# Patient Record
Sex: Male | Born: 1945 | Race: White | Hispanic: No | Marital: Married | Smoking: Never smoker
Health system: Southern US, Community
[De-identification: ages and names within clinical notes are randomized; demographics above are authoritative.]

## PROBLEM LIST (undated history)

## (undated) DIAGNOSIS — E291 Testicular hypofunction: Secondary | ICD-10-CM

## (undated) DIAGNOSIS — M797 Fibromyalgia: Secondary | ICD-10-CM

## (undated) DIAGNOSIS — T7840XA Allergy, unspecified, initial encounter: Secondary | ICD-10-CM

## (undated) DIAGNOSIS — Z86010 Personal history of colonic polyps: Secondary | ICD-10-CM

## (undated) DIAGNOSIS — M7918 Myalgia, other site: Secondary | ICD-10-CM

## (undated) DIAGNOSIS — I00 Rheumatic fever without heart involvement: Secondary | ICD-10-CM

## (undated) DIAGNOSIS — R0600 Dyspnea, unspecified: Secondary | ICD-10-CM

## (undated) DIAGNOSIS — K219 Gastro-esophageal reflux disease without esophagitis: Secondary | ICD-10-CM

## (undated) DIAGNOSIS — Z87828 Personal history of other (healed) physical injury and trauma: Secondary | ICD-10-CM

## (undated) DIAGNOSIS — K648 Other hemorrhoids: Secondary | ICD-10-CM

## (undated) DIAGNOSIS — E785 Hyperlipidemia, unspecified: Secondary | ICD-10-CM

## (undated) DIAGNOSIS — D751 Secondary polycythemia: Secondary | ICD-10-CM

## (undated) DIAGNOSIS — N200 Calculus of kidney: Secondary | ICD-10-CM

## (undated) DIAGNOSIS — I10 Essential (primary) hypertension: Secondary | ICD-10-CM

## (undated) DIAGNOSIS — R768 Other specified abnormal immunological findings in serum: Secondary | ICD-10-CM

## (undated) DIAGNOSIS — E559 Vitamin D deficiency, unspecified: Secondary | ICD-10-CM

## (undated) DIAGNOSIS — N400 Enlarged prostate without lower urinary tract symptoms: Secondary | ICD-10-CM

## (undated) DIAGNOSIS — Z87438 Personal history of other diseases of male genital organs: Secondary | ICD-10-CM

## (undated) DIAGNOSIS — R0609 Other forms of dyspnea: Secondary | ICD-10-CM

## (undated) DIAGNOSIS — M779 Enthesopathy, unspecified: Secondary | ICD-10-CM

## (undated) DIAGNOSIS — N419 Inflammatory disease of prostate, unspecified: Secondary | ICD-10-CM

## (undated) HISTORY — DX: Hyperlipidemia, unspecified: E78.5

## (undated) HISTORY — DX: Personal history of other (healed) physical injury and trauma: Z87.828

## (undated) HISTORY — DX: Other hemorrhoids: K64.8

## (undated) HISTORY — DX: Testicular hypofunction: E29.1

## (undated) HISTORY — DX: Inflammatory disease of prostate, unspecified: N41.9

## (undated) HISTORY — DX: Allergy, unspecified, initial encounter: T78.40XA

## (undated) HISTORY — DX: Calculus of kidney: N20.0

## (undated) HISTORY — DX: Fibromyalgia: M79.7

## (undated) HISTORY — DX: Enthesopathy, unspecified: M77.9

## (undated) HISTORY — DX: Other forms of dyspnea: R06.09

## (undated) HISTORY — DX: Vitamin D deficiency, unspecified: E55.9

## (undated) HISTORY — DX: Secondary polycythemia: D75.1

## (undated) HISTORY — DX: Other specified abnormal immunological findings in serum: R76.8

## (undated) HISTORY — DX: Essential (primary) hypertension: I10

## (undated) HISTORY — DX: Myalgia, other site: M79.18

## (undated) HISTORY — DX: Personal history of other diseases of male genital organs: Z87.438

## (undated) HISTORY — DX: Personal history of colonic polyps: Z86.010

## (undated) HISTORY — DX: Rheumatic fever without heart involvement: I00

## (undated) HISTORY — DX: Dyspnea, unspecified: R06.00

## (undated) HISTORY — DX: Gastro-esophageal reflux disease without esophagitis: K21.9

## (undated) HISTORY — DX: Benign prostatic hyperplasia without lower urinary tract symptoms: N40.0

---
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
## 1978-09-25 DIAGNOSIS — Z87828 Personal history of other (healed) physical injury and trauma: Secondary | ICD-10-CM

## 1978-09-25 HISTORY — DX: Personal history of other (healed) physical injury and trauma: Z87.828

## 1998-09-25 DIAGNOSIS — M779 Enthesopathy, unspecified: Secondary | ICD-10-CM

## 1998-09-25 HISTORY — DX: Enthesopathy, unspecified: M77.9

## 1998-09-25 HISTORY — PX: FINGER SURGERY: SHX640

## 1998-12-03 ENCOUNTER — Ambulatory Visit (HOSPITAL_COMMUNITY): Admission: RE | Admit: 1998-12-03 | Discharge: 1998-12-03 | Payer: Self-pay | Admitting: Internal Medicine

## 1998-12-03 ENCOUNTER — Encounter: Payer: Self-pay | Admitting: Internal Medicine

## 2002-02-10 ENCOUNTER — Ambulatory Visit (HOSPITAL_COMMUNITY): Admission: RE | Admit: 2002-02-10 | Discharge: 2002-02-10 | Payer: Self-pay | Admitting: Internal Medicine

## 2002-02-10 ENCOUNTER — Encounter: Payer: Self-pay | Admitting: Internal Medicine

## 2002-06-18 ENCOUNTER — Emergency Department (HOSPITAL_COMMUNITY): Admission: EM | Admit: 2002-06-18 | Discharge: 2002-06-18 | Payer: Self-pay | Admitting: *Deleted

## 2004-07-11 ENCOUNTER — Encounter: Admission: RE | Admit: 2004-07-11 | Discharge: 2004-07-11 | Payer: Self-pay | Admitting: Internal Medicine

## 2005-04-28 ENCOUNTER — Encounter: Admission: RE | Admit: 2005-04-28 | Discharge: 2005-04-28 | Payer: Self-pay | Admitting: General Surgery

## 2005-08-25 ENCOUNTER — Ambulatory Visit (HOSPITAL_COMMUNITY): Admission: RE | Admit: 2005-08-25 | Discharge: 2005-08-25 | Payer: Self-pay | Admitting: General Surgery

## 2005-08-25 HISTORY — PX: OTHER SURGICAL HISTORY: SHX169

## 2005-10-10 ENCOUNTER — Encounter: Admission: RE | Admit: 2005-10-10 | Discharge: 2005-10-10 | Payer: Self-pay | Admitting: General Surgery

## 2007-06-05 ENCOUNTER — Ambulatory Visit: Payer: Self-pay | Admitting: Gastroenterology

## 2007-06-19 ENCOUNTER — Ambulatory Visit: Payer: Self-pay | Admitting: Gastroenterology

## 2007-06-19 ENCOUNTER — Encounter: Payer: Self-pay | Admitting: Gastroenterology

## 2007-06-19 DIAGNOSIS — Z860101 Personal history of adenomatous and serrated colon polyps: Secondary | ICD-10-CM | POA: Insufficient documentation

## 2007-06-19 DIAGNOSIS — Z86010 Personal history of colonic polyps: Secondary | ICD-10-CM

## 2007-06-19 HISTORY — DX: Personal history of colonic polyps: Z86.010

## 2007-06-19 HISTORY — DX: Personal history of adenomatous and serrated colon polyps: Z86.0101

## 2008-06-05 ENCOUNTER — Ambulatory Visit (HOSPITAL_BASED_OUTPATIENT_CLINIC_OR_DEPARTMENT_OTHER): Admission: RE | Admit: 2008-06-05 | Discharge: 2008-06-05 | Payer: Self-pay | Admitting: Orthopedic Surgery

## 2010-01-11 ENCOUNTER — Encounter: Payer: Self-pay | Admitting: Cardiology

## 2010-01-12 ENCOUNTER — Ambulatory Visit (HOSPITAL_COMMUNITY): Admission: RE | Admit: 2010-01-12 | Discharge: 2010-01-12 | Payer: Self-pay | Admitting: Internal Medicine

## 2010-01-18 ENCOUNTER — Encounter: Payer: Self-pay | Admitting: Cardiology

## 2010-01-19 ENCOUNTER — Encounter: Payer: Self-pay | Admitting: Cardiology

## 2010-01-19 LAB — CONVERTED CEMR LAB
ALT: 48 units/L
AST: 25 units/L
Albumin: 3.9 g/dL
Alkaline Phosphatase: 53 units/L
BUN: 28 mg/dL
Basophils Relative: 0 %
Bilirubin, Direct: 0.2 mg/dL
CO2: 26 meq/L
Calcium: 8 mg/dL
Chloride: 104 meq/L
Creatinine, Ser: 1.22 mg/dL
Eosinophils Relative: 0 %
Glucose, Bld: 94 mg/dL
HCT: 50.1 %
Hemoglobin: 18 g/dL
Lymphocytes, automated: 15 %
MCV: 92.1 fL
Monocytes Relative: 8 %
Platelets: 277 10*3/uL
Potassium: 4.7 meq/L
RBC: 5.44 M/uL
RDW: 14.5 %
Sodium: 140 meq/L
Total Bilirubin: 0.9 mg/dL
Total Protein: 6.1 g/dL
WBC: 11.4 10*3/uL

## 2010-01-20 ENCOUNTER — Encounter: Payer: Self-pay | Admitting: Cardiology

## 2010-01-20 ENCOUNTER — Encounter: Admission: RE | Admit: 2010-01-20 | Discharge: 2010-01-20 | Payer: Self-pay | Admitting: Internal Medicine

## 2010-01-21 ENCOUNTER — Ambulatory Visit: Payer: Self-pay | Admitting: Cardiology

## 2010-01-25 ENCOUNTER — Encounter: Payer: Self-pay | Admitting: Cardiology

## 2010-02-09 ENCOUNTER — Telehealth (INDEPENDENT_AMBULATORY_CARE_PROVIDER_SITE_OTHER): Payer: Self-pay | Admitting: *Deleted

## 2010-02-10 ENCOUNTER — Encounter (HOSPITAL_COMMUNITY): Admission: RE | Admit: 2010-02-10 | Discharge: 2010-04-26 | Payer: Self-pay | Admitting: Cardiology

## 2010-02-10 ENCOUNTER — Ambulatory Visit: Payer: Self-pay | Admitting: Cardiology

## 2010-02-10 ENCOUNTER — Ambulatory Visit: Payer: Self-pay | Admitting: Internal Medicine

## 2010-02-10 ENCOUNTER — Encounter: Payer: Self-pay | Admitting: Internal Medicine

## 2010-02-10 ENCOUNTER — Ambulatory Visit: Payer: Self-pay

## 2010-02-10 ENCOUNTER — Ambulatory Visit (HOSPITAL_COMMUNITY): Admission: RE | Admit: 2010-02-10 | Discharge: 2010-02-10 | Payer: Self-pay | Admitting: Cardiology

## 2010-02-14 ENCOUNTER — Encounter: Payer: Self-pay | Admitting: Internal Medicine

## 2010-02-15 ENCOUNTER — Ambulatory Visit: Payer: Self-pay | Admitting: Cardiology

## 2010-02-15 DIAGNOSIS — I10 Essential (primary) hypertension: Secondary | ICD-10-CM | POA: Insufficient documentation

## 2010-02-15 DIAGNOSIS — E785 Hyperlipidemia, unspecified: Secondary | ICD-10-CM

## 2010-02-15 LAB — CONVERTED CEMR LAB
Cholesterol: 205 mg/dL — ABNORMAL HIGH (ref 0–200)
Direct LDL: 151 mg/dL
HDL: 44.3 mg/dL (ref >39.00)
Pro B Natriuretic peptide (BNP): 5.3 pg/mL (ref 0.0–100.0)
Total CHOL/HDL Ratio: 5
Triglycerides: 102 mg/dL (ref 0.0–149.0)
VLDL: 20.4 mg/dL (ref 0.0–40.0)

## 2010-02-22 ENCOUNTER — Telehealth: Payer: Self-pay | Admitting: Cardiology

## 2010-02-22 ENCOUNTER — Ambulatory Visit: Payer: Self-pay | Admitting: Cardiology

## 2010-02-22 LAB — CONVERTED CEMR LAB
BUN: 23 mg/dL (ref 6–23)
Basophils Absolute: 0 10*3/uL (ref 0.0–0.1)
Basophils Relative: 0.4 % (ref 0.0–3.0)
CO2: 27 meq/L (ref 19–32)
Calcium: 9.1 mg/dL (ref 8.4–10.5)
Chloride: 104 meq/L (ref 96–112)
Creatinine, Ser: 1.1 mg/dL (ref 0.4–1.5)
Eosinophils Absolute: 0.1 10*3/uL (ref 0.0–0.7)
Eosinophils Relative: 1.9 % (ref 0.0–5.0)
GFR calc non Af Amer: 71.88 mL/min (ref >60)
Glucose, Bld: 153 mg/dL — ABNORMAL HIGH (ref 70–99)
HCT: 48.9 % (ref 39.0–52.0)
Hemoglobin: 17.2 g/dL — ABNORMAL HIGH (ref 13.0–17.0)
INR: 1 (ref 0.8–1.0)
Lymphocytes Relative: 26.8 % (ref 12.0–46.0)
Lymphs Abs: 1.4 10*3/uL (ref 0.7–4.0)
MCHC: 35.2 g/dL (ref 30.0–36.0)
MCV: 96 fL (ref 78.0–100.0)
Monocytes Absolute: 0.4 10*3/uL (ref 0.1–1.0)
Monocytes Relative: 8.4 % (ref 3.0–12.0)
Neutro Abs: 3.2 10*3/uL (ref 1.4–7.7)
Neutrophils Relative %: 62.5 % (ref 43.0–77.0)
Platelets: 169 10*3/uL (ref 150.0–400.0)
Potassium: 4.1 meq/L (ref 3.5–5.1)
Prothrombin Time: 10.4 s (ref 9.1–11.7)
RBC: 5.09 M/uL (ref 4.22–5.81)
RDW: 13.5 % (ref 11.5–14.6)
Sodium: 139 meq/L (ref 135–145)
WBC: 5.2 10*3/uL (ref 4.5–10.5)
aPTT: 29.7 s — ABNORMAL HIGH (ref 21.7–28.8)

## 2010-02-23 HISTORY — PX: CARDIAC CATHETERIZATION: SHX172

## 2010-02-25 ENCOUNTER — Ambulatory Visit: Payer: Self-pay | Admitting: Cardiology

## 2010-02-25 ENCOUNTER — Inpatient Hospital Stay (HOSPITAL_BASED_OUTPATIENT_CLINIC_OR_DEPARTMENT_OTHER): Admission: RE | Admit: 2010-02-25 | Discharge: 2010-02-25 | Payer: Self-pay | Admitting: Cardiology

## 2010-03-11 ENCOUNTER — Ambulatory Visit: Payer: Self-pay | Admitting: Cardiology

## 2010-03-11 LAB — CONVERTED CEMR LAB
BUN: 18 mg/dL (ref 6–23)
CO2: 31 meq/L (ref 19–32)
Calcium: 9.1 mg/dL (ref 8.4–10.5)
Chloride: 104 meq/L (ref 96–112)
Creatinine, Ser: 1.3 mg/dL (ref 0.4–1.5)
GFR calc non Af Amer: 61.44 mL/min (ref >60)
Glucose, Bld: 99 mg/dL (ref 70–99)
Potassium: 4.5 meq/L (ref 3.5–5.1)
Sodium: 141 meq/L (ref 135–145)
Total CK: 238 units/L — ABNORMAL HIGH (ref 7–232)

## 2010-03-25 ENCOUNTER — Ambulatory Visit: Payer: Self-pay | Admitting: Cardiology

## 2010-03-29 ENCOUNTER — Telehealth: Payer: Self-pay | Admitting: Cardiology

## 2010-03-29 LAB — CONVERTED CEMR LAB
BUN: 23 mg/dL (ref 6–23)
CO2: 30 meq/L (ref 19–32)
Calcium: 8.9 mg/dL (ref 8.4–10.5)
Chloride: 105 meq/L (ref 96–112)
Creatinine, Ser: 1.3 mg/dL (ref 0.4–1.5)
GFR calc non Af Amer: 59.79 mL/min (ref >60)
Glucose, Bld: 105 mg/dL — ABNORMAL HIGH (ref 70–99)
Potassium: 4.2 meq/L (ref 3.5–5.1)
Sodium: 142 meq/L (ref 135–145)

## 2010-04-18 ENCOUNTER — Ambulatory Visit: Payer: Self-pay | Admitting: Cardiology

## 2010-04-22 LAB — CONVERTED CEMR LAB
ALT: 36 units/L (ref 0–53)
AST: 27 units/L (ref 0–37)
Albumin: 4 g/dL (ref 3.5–5.2)
Alkaline Phosphatase: 44 units/L (ref 39–117)
Bilirubin, Direct: 0.2 mg/dL (ref 0.0–0.3)
Cholesterol: 151 mg/dL (ref 0–200)
HDL: 35.8 mg/dL — ABNORMAL LOW (ref >39.00)
LDL Cholesterol: 90 mg/dL (ref 0–99)
Total Bilirubin: 1 mg/dL (ref 0.3–1.2)
Total CHOL/HDL Ratio: 4
Total Protein: 6.4 g/dL (ref 6.0–8.3)
Triglycerides: 124 mg/dL (ref 0.0–149.0)
VLDL: 24.8 mg/dL (ref 0.0–40.0)

## 2010-10-27 NOTE — Assessment & Plan Note (Signed)
Summary: NP6/AMD   Primary Provider:  [name]  CC:  initial Consult; Fatigue; SOB.  History of Present Illness: 65 yo with history of HTN, hyperlipidemia, fibromyalgia, and rheumatic fever as a child presents for evaluation of fatigue, chest pain, shortness of breath, and pulmonary edema seen on chest CT.  Patient states that he first became sick about 3 wks ago.  He had a fever to 101 along with shortness of breath, nausea, and vomiting.  He took an antibiotic without much relief.  He has been exhausted at work.  He is no longer running a fever.  He is now short of breath after walking 50 feet and with yardwork.  He has an occasional dry cough.  No orthopnea or PND.  He has occasional chest tightness that comes and goes and is not related to exertion.  This has been present for about 1 month.  He had a chest CT done on 4/28 showing coronary calcification and slight dependent interstitial pulmonary edema.  No definite pneumonia.   ECG: NSR, LAFB  Labs (4/11): HCT 50, K 4.7, creatinine 1.2, TSH normal  Current Medications (verified): 1)  Diovan Hct 160-12.5 Mg Tabs (Valsartan-Hydrochlorothiazide) .... Take 1/2 Tablet By Mouth Once Daily 2)  Prevacid 30 Mg Cpdr (Lansoprazole) .... Take 1 Tablet By Mouth Once Daily 3)  Celebrex 200 Mg Caps (Celecoxib) .... Take 1 Tablet By Mouth Two Times A Day 4)  Androgel Pump 1 % Gel (Testosterone) .... Take 1 Pump  By Mouth Once Daily 5)  Colcrys 0.6 Mg Tabs (Colchicine) .... Take 1 Tablet By Mouth Once Daily 6)  Multivitamins  Tabs (Multiple Vitamin) .... Take 1 By Mouth Once Daily 7)  Aspir-Low 81 Mg Tbec (Aspirin) .... Take 1 Tablet By Mouth Once Daily 8)  Wellbutrin Xl 300 Mg Xr24h-Tab (Bupropion Hcl) .... Take 1 Tablet By Mouth Once Daily 9)  Cialis 20 Mg Tabs (Tadalafil) .... Take 1 Tablet By Mouth Once Daily As Needed 10)  Nexium 40 Mg Cpdr (Esomeprazole Magnesium) .... Take 1 By Mouth Once Daily 11)  Fish Oil 1000 Mg Caps (Omega-3 Fatty Acids)  .... Take 1 By Mouth Two Times A Day  Allergies: 1)  ! Lipitor 2)  ! Lyrica 3)  ! Atenolol 4)  ! Paxil 5)  ! Darvon  Past History:  Past Medical History: 1. BPH 2. G E R D 3. Hypertension 4. Fibromyalgia 5. Hypogonadism 6. History of epididmyitis 7. Myofascial pain syndrome 8. Nephrolithiasis 9. Recurrent prostatitis 10. Prolapsing internal hemorrhoids 11. Hx of head injury/concussion (1980), skull fracture.  12. Hx of left biceps tendinitis (2000) 13. Rheumatic fever x 3 at ages 25, 10, and 29 26. Hyperlipidemia: myalgias with Lipitor  Family History: Father:Deceased  at age 23, died during surgery for aortic stenosis Mother:Deceased at age 101 to HTN, pituitary tumor, Hx of TBC, and CA of breast.  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 Married, lives in [place] Tobacco Use - rare cigars.  Alcohol Use - yes, occasionally drink social cocktail, beer, or  mixed drink daily  Review of Systems       All systems reviewed and negative except as per HPI.   Vital Signs:  Patient profile:   65 year old male Height:      71 inches Weight:      206 pounds BMI:     28.84 Pulse rate:   77 / minute BP sitting:   122 / 84  (right arm)  Vitals Entered By: Stanton Kidney, EMT-P (January 21, 2010 2:56 PM)  Physical Exam  General:  Well developed, well nourished, in no acute distress. Head:  normocephalic and atraumatic Nose:  no deformity, discharge, inflammation, or lesions Mouth:  Teeth, gums and palate normal.  Oral mucosa normal. Neck:  Neck supple, no JVD. No masses, thyromegaly or abnormal cervical nodes. Lungs:  Clear bilaterally to auscultation and percussion. Heart:  Non-displaced PMI, chest non-tender; regular rate and rhythm, S1, S2 without murmurs, rubs or gallops. Carotid upstroke normal, no bruit.  Pedals normal pulses. No edema, no varicosities. Abdomen:  Bowel sounds positive; abdomen soft and non-tender without masses, organomegaly, or hernias noted. No hepatosplenomegaly. Msk:  Back normal, normal gait. Muscle strength and tone normal. Extremities:  No clubbing or cyanosis. Neurologic:  Alert and oriented x 3. Skin:  Intact without lesions or rashes. Psych:  Normal affect.   Impression & Recommendations:  Problem # 1:  SHORTNESS OF BREATH (ICD-786.05) Patient has had dyspnea with exertion for the last 3 weeks.  This is new.  It began after an episode of fever to 101 (he took an antibiotic at the time).  CT chest showed possible dependent interstitial edema  but no definite PNA.  It is possible, given the onset with fever, that this was an episode of atypical PNA.  However, his breathing still has not improved despite no further fever and a course of antibiotics.  Given the increased interstitial markings on CT that could be pulmonary edema, I will get an echocardiogram to assess LV function.  I will also draw a BNP.    Problem # 2:  CHEST PAIN-UNSPECIFIED (ICD-786.50) Patient has atypical chest pain but does have exertional dyspnea and coronary calcification on CT.  I will have him do an ETT-myoview for risk stratification.  We also need to check fasting lipids.    Patient Instructions: 1)  Your physician recommends that you schedule a follow-up appointment in: 2-3 weeks 2)  Your physician recommends that you return for a FASTING lipid profile: day of stress test (lipids/liver) 3)  Your physician recommends that you continue on your current medications as directed. Please refer to the  Current Medication list given to you today. 4)  Your physician has requested that you have an echocardiogram.  Echocardiography is a painless test that uses sound waves to create images of your heart. It provides your doctor with information about the size and shape of your heart and how well your heart's chambers and valves are working.  This procedure takes approximately one hour. There are no restrictions for this procedure. 5)  Your physician has requested that you have an exercise stress myoview.  For further information please visit [email or web address]  Please follow instruction sheet, as given.

## 2010-10-27 NOTE — Assessment & Plan Note (Signed)
Summary: ROV/JML  Medications Added SIMVASTATIN 20 MG TABS (SIMVASTATIN) one in the evening      Allergies Added:   Primary Provider:  [name]  CC:  Curtis Price feeling okay but feeling tired easy and out of breath. .  History of Present Illness: 65 yo with history of HTN, hyperlipidemia, fibromyalgia, and rheumatic fever as a child returns for evaluation of fatigue, chest pain, shortness of breath, and pulmonary edema seen on chest CT.  Patient states that he first became sick about a couple of months ago.  He had a fever to 101 along with shortness of breath, nausea, and vomiting.  He has neve felt completely better.  He has been exhausted at work.   He is now short of breath after walking 50 feet and with yardwork.  A flight of steps is very difficult for him now.  He is not improving.  He says that even before the febrile illness that he had been getting short of breath with exertion, just not as severe.  He has noted this since around Christmas.  Co-workers have been asking him what is wrong.  He has an occasional dry cough.  No orthopnea or PND.  He has occasional chest tightness that comes and goes and is not related to exertion.  He had a chest CT done on 4/28 showing coronary calcification and slight dependent interstitial pulmonary edema.  No definite pneumonia.   I had him do an ETT-myoview.  he was able to walk 8 minutes and stopped due to fatigue/SOB.  No ECG changes.  No evidence for ischemia or infarction.  Normal EF.  Echo showed normal LV systolic function but the RV appeared mildly dilated and dysfunctional.  Pulmonary hypertension was not identified but the TR jet on echo was not complete.    Labs (4/11): HCT 50, K 4.7, creatinine 1.2, TSH normal Labs (5/11): BNP 5.3, LDL 151, HDL 44  Current Medications (verified): 1)  Diovan Hct 160-12.5 Mg Tabs (Valsartan-Hydrochlorothiazide) .... Take 1/2 Tablet By Mouth Once Daily 2)  Prevacid 30 Mg Cpdr (Lansoprazole) .... Take 1 Tablet  By Mouth Once Daily 3)  Celebrex 200 Mg Caps (Celecoxib) .... Take 1 Tablet By Mouth Two Times A Day 4)  Androgel Pump 1 % Gel (Testosterone) .... Take 1 Pump  By Mouth Once Daily 5)  Colcrys 0.6 Mg Tabs (Colchicine) .... Take 1 Tablet By Mouth Once Daily 6)  Multivitamins  Tabs (Multiple Vitamin) .... Take 1 By Mouth Once Daily 7)  Aspir-Low 81 Mg Tbec (Aspirin) .... Take 1 Tablet By Mouth Once Daily 8)  Wellbutrin Xl 300 Mg Xr24h-Tab (Bupropion Hcl) .... Take 1 Tablet By Mouth Once Daily 9)  Cialis 20 Mg Tabs (Tadalafil) .... Take 1 Tablet By Mouth Once Daily As Needed 10)  Nexium 40 Mg Cpdr (Esomeprazole Magnesium) .... Take 1 By Mouth Once Daily 11)  Fish Oil 1000 Mg Caps (Omega-3 Fatty Acids) .... Take 1 By Mouth Two Times A Day  Allergies (verified): 1)  ! Lipitor 2)  ! Lyrica 3)  ! Atenolol 4)  ! Paxil 5)  ! Darvon  Past History:  Past Medical History: 1. BPH 2. G E R D 3. Hypertension 4. Fibromyalgia 5. Hypogonadism 6. History of epididmyitis 7. Myofascial pain syndrome 8. Nephrolithiasis 9. Recurrent prostatitis 10. Prolapsing internal hemorrhoids 11. Hx of head injury/concussion (1980), skull fracture.  12. Hx of left biceps tendinitis (2000) 13. Rheumatic fever x 3 at ages 42, 86, and 55.  No  significant valvular abnormality on 5/11 echo.  14. Hyperlipidemia: myalgias with Lipitor 15. Exertional dyspnea:  ETT-myoview (5/11): 8 min, no ischemic ECG changes, stopped due to fatigue/shortness of breath.  EF 60%, no ischemia or infarction.  Echo (5/11): EF 60%, no significant valvular abnormalities, mild RV dilation and dysfunction, no complete TR doppler jet so PA pressure hard to gauge.   Family History: Reviewed history from 01/21/2010 and no changes required. Father:Deceased  at age 43, died during surgery for aortic stenosis Mother:Deceased at age 50 to HTN, pituitary tumor, Hx of TBC, and CA of breast.  Siblings: Has a sister addicted to crack but other health  problems Children: A daughter addicted to crack cocaine abuse Grandchildren: 2- ages 28 and 21 in good health Family History of Hypertension Family History of Aortic stenosis Family History of CA of breast and pituitary tumor Family History of TBC and is negative for CVA Family History of ASHD Family History of DM Family History of epilepsy  Social History: Reviewed history from 01/21/2010 and no changes required. 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 Married, lives in [place] Tobacco Use - rare cigars.  Alcohol Use - yes, occasionally drink social cocktail, beer, or mixed drink daily  Review of Systems       All systems reviewed and negative except as per HPI.   Vital Signs:  Patient profile:   65 year old male Height:      71 inches Weight:      207 pounds BMI:     28.97 Pulse rate:   84 / minute Pulse rhythm:   regular BP sitting:   154 / 96  (right arm) Cuff size:   regular  Vitals Entered By: [name] CMA (Feb 15, 2010 10:22 AM)  Physical Exam  General:  Well developed, well nourished, in no acute distress. Neck:  Neck supple, no JVD. No masses, thyromegaly or abnormal cervical nodes. Lungs:  Clear bilaterally to auscultation and percussion. Heart:  Non-displaced PMI, chest non-tender; regular rate and rhythm, S1, S2 without murmurs, rubs or gallops. Carotid upstroke normal, no bruit.  Pedals normal pulses. No edema, no varicosities. Abdomen:  Bowel sounds positive; abdomen soft and non-tender without masses, organomegaly, or hernias noted. No hepatosplenomegaly. Extremities:  No clubbing or cyanosis. Neurologic:  Alert and oriented x 3. Psych:  Normal affect.   Impression & Recommendations:  Problem # 1:  SHORTNESS OF BREATH (ICD-786.05) Patient has had significant exertional dyspnea for several months.  It worsened after an episode of fever to 101 (he took an antibiotic at the time) 6 weeks or so ago.  It  has not gotten any better and is a definite change from prior.  CT chest in April showed possible dependent interstitial edema as well as coronary calcification but no definite PNA.  Myoview was not suggestive of ischemia or infarction.  Echocardiogram showed normal LV systolic function with no significant valvular abnormalities.  The RV was mildly dilated with mild systolic dysfunction.  PA systolic pressure was hard to determine given incomplete TR jet.  I had a long discussion with the patient today.  He still feels significantly limited.  He has no clear association of chest pain to his shortness of breath.  Given the RV findings and ongoing dyspnea, I would like to do a right heart cath to measure LV and RV filling pressures and also assess for pulmonary hypertension.  Given risk factors and ongoing exertional  dyspnea, I will also do a left heart cath.  If these tests are unremarkable, I will refer him to pulmonology for further evaluation.    Problem # 2:  HYPERLIPIDEMIA-MIXED (ICD-272.4) LDL is elevated at 151.  I will have him start simvastatin 20 mg daily with lipids/LFTs in 2 months.   Problem # 3:  HYPERTENSION, UNSPECIFIED (ICD-401.9) BP is high today and rechecked at 148/96.  At home, SBP has been < 140.  I will have him check his BP daily for a week and we will call him for readings.  If running high at home, will increase valsartan/HCTZ.    Other Orders: Cardiac Catheterization (Cardiac Cath)  Patient Instructions: 1)  Your physician has recommended you make the following change in your medication: 2)  Start Simvastatin 20mg  in the evening. 3)  Your physician has requested that you regularly monitor and record your blood pressure readings at home.  Please use the same machine at the same time of day to check your readings. I will call you in 1 week to get the readings. Luana Shu  4)  Your physician recommends that you return for lab work on Tuesday May 31,2011--BMP/CBC/PT/TSH  786.09 272.0 401.9 5)  Your physician recommends that you return for a FASTING lipid profile/liver profile in 2 months 401.9  272.0  786.09 6)  Your physician recommends that you schedule a follow-up appointment in: 2-3 weeks with Dr Shirlee Latch. 7)  Your physician has requested that you have a cardiac catheterization.  Cardiac catheterization is used to diagnose and/or treat various heart conditions. Doctors may recommend this procedure for a number of different reasons. The most common reason is to evaluate chest pain. Chest pain can be a symptom of coronary artery disease (CAD), and cardiac catheterization can show whether plaque is narrowing or blocking your heart's arteries. This procedure is also used to evaluate the valves, as well as measure the blood flow and oxygen levels in different parts of your heart.  For further information please visit [email or web address]  Please follow instruction sheet, as given. Prescriptions: SIMVASTATIN 20 MG TABS (SIMVASTATIN) one in the evening  #30 x 3   Entered by:   Katina Dung, RN, BSN   Authorized by:   Marca Ancona, MD   Signed by:   Katina Dung, RN, BSN on 02/15/2010   Method used:   Electronically to        [id number] (retail)       [address]       [place], [place]  [address]       Ph: [phone] or [phone]       Fax: [phone]   RxID:   [id number]    Appended Document: ROV/JML pt not taking Prevacid   Clinical Lists Changes  Medications: Removed medication of PREVACID 30 MG CPDR (LANSOPRAZOLE) Take 1 tablet by mouth once daily

## 2010-10-27 NOTE — Letter (Signed)
Summary: Cardiac Catheterization Instructions- [facility]  [facility], Main Office  [address]   [place], [place] [address]   Phone: [phone]  Fax: [phone]     02/15/2010 MRN: [id number]  [facility] [address] [place], [place]  [address]  Dear Mr. Lahm,   You are scheduled for a Cardiac Catheterization on Friday June 3,2011 with Dr. Marca Ancona.  Please arrive to the 1st floor of the [facility] at [facility] at 6:30 am on the day of your procedure. Please do not arrive before 6:30 a.m. Call the Heart and Vascular Center at [phone] if you are unable to make your appointmnet. The Code to get into the parking garage under the building is 0100. Take the elevators to the 1st floor. You must have someone to drive you home. Someone must be with you for the first 24 hours after you arrive home. Please wear clothes that are easy to get on and off and wear slip-on shoes. Do not eat or drink after midnight except water with your medications that morning. Bring all your medications and current insurance cards with you.   _x__ Make sure you take your aspirin.  _x__ You may take ALL of your medications with water that morning.    The usual length of stay after your procedure is 2 to 3 hours. This can vary.  If you have any questions, please call the office at the number listed above.   Katina Dung, RN, BSN

## 2010-10-27 NOTE — Assessment & Plan Note (Signed)
Summary: 3wk f/u sl  Medications Added DIOVAN HCT 320-25 MG TABS (VALSARTAN-HYDROCHLOROTHIAZIDE) one tablet daily CRESTOR 5 MG TABS (ROSUVASTATIN CALCIUM) one every other day MAGNESIUM 250 MG TABS (MAGNESIUM) take one tablet two times a day STRESS B COMPLEX/ZINC  TABS (MULTIPLE VITAMINS-MINERALS) one tablet once daily CALCIUM-VITAMIN D 250-125 MG-UNIT TABS (CALCIUM CARBONATE-VITAMIN D) take one tablet three times a week CRESTOR 5 MG TABS (ROSUVASTATIN CALCIUM) one every other day      Allergies Added:   Primary Provider:  [name]  CC:  3 week follow up.  Pt reports pain in his legs and thighs.  History of Present Illness: 65 yo with history of HTN, hyperlipidemia, fibromyalgia, and rheumatic fever as a child returns for followup of fatigue and dyspnea.  After last appointment, I did a right and left heart cath that showed normal left heart filling pressure, no pulmonary hypertension, normal LV systolic function, and minimal coronary disease.  I cannot explain his symptoms from his heart unless he gets exercise-dependent elevation in LV filling pressure with resulting dyspnea (exercise-induced diastolic dysfunction).  Actually since I saw him last, his exertional shortness of breath has resolved for the most part.  He feels much better.  His main complaint is leg pain that has begun since starting simvastatin.  He was under a lot of stress at work for several months, this is actually better now.  His BP is still running high despite increasing his valsartan/HCT when he last saw me.   Labs (4/11): HCT 50, K 4.7, creatinine 1.2, TSH normal Labs (5/11): BNP 5.3, LDL 151, HDL 44 Labs (6/11): K 4.5, creatinine 1.3, CK 238  Current Medications (verified): 1)  Diovan Hct 160-12.5 Mg Tabs (Valsartan-Hydrochlorothiazide) .... One  Daily 2)  Celebrex 200 Mg Caps (Celecoxib) .... Take 1 Tablet By Mouth Two Times A Day 3)  Androgel Pump 1 % Gel (Testosterone) .... Take 1 Pump  By Mouth Once  Daily 4)  Colcrys 0.6 Mg Tabs (Colchicine) .... Take 1 Tablet By Mouth Once Daily 5)  Multivitamins  Tabs (Multiple Vitamin) .... Take 1 By Mouth Once Daily 6)  Aspir-Low 81 Mg Tbec (Aspirin) .... Take 1 Tablet By Mouth Once Daily 7)  Wellbutrin Xl 300 Mg Xr24h-Tab (Bupropion Hcl) .... Take 1 Tablet By Mouth Once Daily 8)  Cialis 20 Mg Tabs (Tadalafil) .... Take 1 Tablet By Mouth Once Daily As Needed 9)  Nexium 40 Mg Cpdr (Esomeprazole Magnesium) .... Take 1 By Mouth Once Daily 10)  Fish Oil 1000 Mg Caps (Omega-3 Fatty Acids) .... Take 1 By Mouth Two Times A Day 11)  Simvastatin 20 Mg Tabs (Simvastatin) .... One in The Evening 12)  Magnesium 250 Mg Tabs (Magnesium) .... Take One Tablet Two Times A Day 13)  Stress B Complex/zinc  Tabs (Multiple Vitamins-Minerals) .... One Tablet Once Daily 14)  Calcium-Vitamin D 250-125 Mg-Unit Tabs (Calcium Carbonate-Vitamin D) .... Take One Tablet Three Times A Week  Allergies (verified): 1)  ! Lipitor 2)  ! Lyrica 3)  ! Atenolol 4)  ! Paxil 5)  ! Darvon  Past History:  Past Medical History: 1. BPH 2. G E R D 3. Hypertension 4. Fibromyalgia 5. Hypogonadism 6. History of epididmyitis 7. Myofascial pain syndrome 8. Nephrolithiasis 9. Recurrent prostatitis 10. Prolapsing internal hemorrhoids 11. Hx of head injury/concussion (1980), skull fracture.  12. Hx of left biceps tendinitis (2000) 13. Rheumatic fever x 3 at ages 81, 38, and 81.  No significant valvular abnormality on 5/11 echo.  14. Hyperlipidemia: myalgias with Lipitor 15. Exertional dyspnea:  ETT-myoview (5/11): 8 min, no ischemic ECG changes, stopped due to fatigue/shortness of breath.  EF 60%, no ischemia or infarction.  Echo (5/11): EF 60%, no significant valvular abnormalities, mild RV dilation and dysfunction, no complete TR doppler jet so PA pressure hard to gauge.  Left heart cath (6/11) showed minimal nonobstructive CAD with EF 60%.  Right heart cath (6/11): mean RA 10 mmHg, PA  25/18, mean PCWP 13 mmHg, CI 2.1  Family History: Reviewed history from 01/21/2010 and no changes required. Father:Deceased  at age 42, died during surgery for aortic stenosis Mother:Deceased at age 65 to HTN, pituitary tumor, Hx of TBC, and CA of breast.  Siblings: Has a sister addicted to crack but other health problems Children: A daughter addicted to crack cocaine abuse Grandchildren: 2- ages 28 and 46 in good health Family History of Hypertension Family History of Aortic stenosis Family History of CA of breast and pituitary tumor Family History of TBC and is negative for CVA Family History of ASHD Family History of DM Family History of epilepsy  Social History: Reviewed history from 01/21/2010 and no changes required. 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 Married, lives in [place] Tobacco Use - rare cigars.  Alcohol Use - yes, occasionally drink social cocktail, beer, or mixed drink daily  Review of Systems       All systems reviewed and negative except as per HPI.   Vital Signs:  Patient profile:   65 year old male Height:      71 inches Weight:      212 pounds BMI:     29.67 Pulse rate:   82 / minute Pulse rhythm:   regular BP sitting:   152 / 90  (left arm) Cuff size:   regular  Vitals Entered By: [name] CMA (March 11, 2010 9:37 AM)  Physical Exam  General:  Well developed, well nourished, in no acute distress. Neck:  Neck supple, no JVD. No masses, thyromegaly or abnormal cervical nodes. Lungs:  Clear bilaterally to auscultation and percussion. Heart:  Non-displaced PMI, chest non-tender; regular rate and rhythm, S1, S2 without murmurs, rubs or gallops. Carotid upstroke normal, no bruit.  Pedals normal pulses. No edema, no varicosities. Abdomen:  Bowel sounds positive; abdomen soft and non-tender without masses, organomegaly, or hernias noted. No hepatosplenomegaly. Extremities:  No clubbing or  cyanosis. Neurologic:  Alert and oriented x 3. Psych:  Normal affect.   Impression & Recommendations:  Problem # 1:  MUSCLE PAIN (ICD-729.1) Very slight CK elevation.  I suspect this may be due to Zocor.  I will have him stop Zocor and start on a very low dose of Crestor at 5 mg every other day.  Will get lipids/LFTs in 2 months.    Problem # 2:  SHORTNESS OF BREATH (ICD-786.05) I cannot find a cardiac explanation for his shortness of breath.  Coronaries have minimal disease, LV systolic function is normal, and at rest, LV filling pressure is normal.  It is possible that he could have some diastolic dysfunction with exertion manifesting as shortness of breath.  The best way to deal with this will be blood pressure control.    Problem # 3:  HYPERTENSION, UNSPECIFIED (ICD-401.9) BP is still running high.  Will increase valsartan/HCT to 320/25 mg daily.  BMET and BP check in 2 wks.   Other Orders: TLB-BMP (Basic Metabolic Panel-BMET) (80048-METABOL) TLB-CK  Total Only(Creatine Kinase/CPK) (82550-CK)  Patient Instructions: 1)  Your physician has recommended you make the following change in your medication:  2)  Stop Zocor(simvastatin) 3)  Start Crestor 5mg  every other day. 4)  Increase Valsartan/HCT to 320/25mg  daily. 5)  Lab  today--BMP/CPK--786.09 401.9   272.4 6)  Lab in 2 weeks--BMP 786.09 401.9 272.4 7)  Your physician recommends that you return for a FASTING lipid profile/liver profile --AUGUST 2011 786.09 401.9 272.4 8)  Your physician has requested that you regularly monitor and record your blood pressure readings at home.  Please use the same machine at the same time of day to check your readings. I will call you in 2 weeks to get the readings. Luana Shu [phone]  9)  Your physician wants you to follow-up in: ONE YEAR with Dr Shirlee Latch.  You will receive a reminder letter in the mail two months in advance. If you don't receive a letter, please call our office to schedule the  follow-up appointment. Prescriptions: DIOVAN HCT 320-25 MG TABS (VALSARTAN-HYDROCHLOROTHIAZIDE) one tablet daily  #30 x 11   Entered by:   Katina Dung, RN, BSN   Authorized by:   Marca Ancona, MD   Signed by:   Katina Dung, RN, BSN on 03/11/2010   Method used:   Electronically to        [facility]  [address] (retail)       [address]       [place], [place]  [address]       Ph: [phone] or [phone]       Fax: [phone]   RxID:   [id number] CRESTOR 5 MG TABS (ROSUVASTATIN CALCIUM) one every other day  #15 x 3   Entered by:   Katina Dung, RN, BSN   Authorized by:   Marca Ancona, MD   Signed by:   Katina Dung, RN, BSN on 03/11/2010   Method used:   Electronically to        [facility]  [address] (retail)       [address]       [place], [place]  [address]       Ph: [phone] or [phone]       Fax: [phone]   RxID:   [id number]

## 2010-10-27 NOTE — Letter (Signed)
Summary: Internal Other  Internal Other   Imported By: Harlon Flor 01/27/2010 16:55:28  _____________________________________________________________________  External Attachment:    Type:   Image     Comment:   External Document

## 2010-10-27 NOTE — Progress Notes (Signed)
Summary: Nuclear Pre-Procedure  Phone Note Outgoing Call   Call placed by: Milana Na, EMT-P,  Feb 09, 2010 3:46 PM Summary of Call: Left message with information on Myoview Information Sheet (see scanned document for details).      Nuclear Med Background Indications for Stress Test: Evaluation for Ischemia   History: CT/MRI, GXT  History Comments: '94 GXT NL 01/20/10 CHEST CT Coronary Calcification  Symptoms: Chest Tightness, DOE, Fatigue    Nuclear Pre-Procedure Height (in): 71

## 2010-10-27 NOTE — Letter (Signed)
Summary: [facility]  [facility]   Imported By: Debby Freiberg 03/22/2010 11:52:22  _____________________________________________________________________  External Attachment:    Type:   Image     Comment:   External Document

## 2010-10-27 NOTE — Progress Notes (Signed)
Summary: B/P readings   Phone Note Outgoing Call   Call placed by: Katina Dung, RN, BSN,  Feb 22, 2010 11:54 AM Call placed to: Patient Summary of Call: B/P readings  Follow-up for Phone Call        attempted to contact pt to get B/P readings LMTCB Katina Dung, RN, BSN  Feb 22, 2010 11:55 AM  discussed with wife --per wife pt had not  remembered to check  B/P--wife will try to remember to take and record B/P and bring the readings to appt 03-11-10 with Dr Daphane Shepherd will try to check B/P tonight and if it is  still in the 154/96 range she will call before the cath 02-25-10     Appended Document: B/P readings I asked him in the cath lab today to increase his valsartan/HCTZ to a full pil (160/12.5 today).  He will need a new prescription.   Appended Document: B/P readings LMTCB  Appended Document: B/P readings LMTCB  Appended Document: B/P readings I talked with wife--she states pt had not increased Valsartan/HCT to a full pill daily but she will ask pt to increase to one pill daily--pt has an appt with Dr Shirlee Latch 03-11-10   Clinical Lists Changes  Medications: Changed medication from DIOVAN HCT 160-12.5 MG TABS (VALSARTAN-HYDROCHLOROTHIAZIDE) Take 1/2 tablet by mouth once daily to DIOVAN HCT 160-12.5 MG TABS (VALSARTAN-HYDROCHLOROTHIAZIDE) one  daily

## 2010-10-27 NOTE — Assessment & Plan Note (Signed)
Summary: Cardiology Nuclear Testing  Nuclear Med Background Indications for Stress Test: Evaluation for Ischemia   History: CT/MRI, GXT  History Comments: '94 GXT NL 01/20/10 CHEST CT Coronary Calcification  Symptoms: Chest Tightness, DOE, Fatigue    Nuclear Pre-Procedure Caffeine/Decaff Intake: none NPO After: 7:30 PM Lungs: clear IV 0.9% NS with Angio Cath: 20g     IV Site: (R) AC IV Started by: Milana Na EMT-P Chest Size (in) 44     Height (in): 71 Weight (lb): 206 BMI: 28.84  Nuclear Med Study 1 or 2 day study:  1 day     Stress Test Type:  Stress Reading MD:  Arvilla Meres, MD     Referring MD:  D.McLean Resting Radionuclide:  Technetium 45m Tetrofosmin     Resting Radionuclide Dose:  11.0 mCi  Stress Radionuclide:  Technetium 39m Tetrofosmin     Stress Radionuclide Dose:  33.0 mCi   Stress Protocol Exercise Time (min):  8:00 min     Max HR:  148 bpm     Predicted Max HR:  158 bpm  Max Systolic BP: 193 mm Hg     Percent Max HR:  93.67 %     METS: 10.10 Rate Pressure Product:  16109    Stress Test Technologist:  Milana Na EMT-P     Nuclear Technologist:  Domenic Polite CNMT  Rest Procedure  Myocardial perfusion imaging was performed at rest 45 minutes following the intravenous administration of Myoview Technetium 105m Tetrofosmin.  Stress Procedure  The patient exercised for 8:00. The patient stopped due to fatigue and denied any chest pain.  There were no significant ST-T wave changes.  Myoview was injected at peak exercise and myocardial perfusion imaging was performed after a brief delay.  QPS Raw Data Images:  Normal; no motion artifact; normal heart/lung ratio. Stress Images:  There is normal uptake in all areas. Rest Images:  Normal homogeneous uptake in all areas of the myocardium. Subtraction (SDS):  Normal Transient Ischemic Dilatation:  .89  (Normal <1.22)  Lung/Heart Ratio:  .21  (Normal <0.45)  Quantitative Gated Spect Images QGS  EDV:  87 ml QGS ESV:  35 ml QGS EF:  60 % QGS cine images:  Normal  Findings Normal nuclear study      Overall Impression  Exercise Capacity: Fair exercise capacity. BP Response: Normal blood pressure response. Clinical Symptoms: No chest pain ECG Impression: No significant ST segment change suggestive of ischemia. Overall Impression: Normal stress nuclear study.  Appended Document: Cardiology Nuclear Testing normal study  Appended Document: Cardiology Nuclear Testing appt 02-15-10

## 2010-10-27 NOTE — Letter (Signed)
Summary: [facility]  [facility]   Imported By: Debby Freiberg 03/22/2010 11:51:19  _____________________________________________________________________  External Attachment:    Type:   Image     Comment:   External Document

## 2010-10-27 NOTE — Progress Notes (Signed)
Summary: PHI  PHI   Imported By: Harlon Flor 01/25/2010 09:31:08  _____________________________________________________________________  External Attachment:    Type:   Image     Comment:   External Document

## 2010-10-27 NOTE — Progress Notes (Signed)
Summary: B/P readings 03/29/10   Phone Note Outgoing Call   Call placed by: Katina Dung, RN, BSN,  March 29, 2010 5:54 PM Call placed to: Patient Summary of Call: B/P readings  Follow-up for Phone Call        LM with wife for pt to call me with B/P readings Katina Dung, RN, BSN  March 29, 2010 5:56 PM  [facility] Katina Dung, RN, BSN  March 30, 2010 8:48 AM  [facility] Katina Dung, RN, BSN  March 30, 2010 5:15 PM  talked with pt by telephone--he states B/P averaging 130s/80s--he states he feels good --pt states he is tolerating Crestor 5mg  every other day --will forward to Dr Shirlee Latch for review      Appended Document: B/P readings 03/29/10 ok, no changes  Appended Document: B/P readings 03/29/10 discussed with pt by telephone

## 2010-10-27 NOTE — Assessment & Plan Note (Signed)
Summary: Cardiology Nuclear Study  Nuclear Med Background Indications for Stress Test: Evaluation for Ischemia   History: CT/MRI, GXT  History Comments: '94 GXT NL 01/20/10 CHEST CT Coronary Calcification  Symptoms: Chest Tightness, DOE, Fatigue    Nuclear Pre-Procedure Caffeine/Decaff Intake: none NPO After: 7:30 PM IV 0.9% NS with Angio Cath: 20g     IV Site: (R) AC IV Started by: Milana Na EMT-P Chest Size (in) 44     Height (in): 71 Weight (lb): 206 BMI: 28.84

## 2010-12-12 LAB — POCT I-STAT 3, VENOUS BLOOD GAS (G3P V)
Acid-Base Excess: 1 mmol/L (ref 0.0–2.0)
Bicarbonate: 27.6 mEq/L — ABNORMAL HIGH (ref 20.0–24.0)
O2 Saturation: 70 %
TCO2: 29 mmol/L (ref 0–100)
pCO2, Ven: 51.9 mmHg — ABNORMAL HIGH (ref 45.0–50.0)
pH, Ven: 7.334 — ABNORMAL HIGH (ref 7.250–7.300)
pO2, Ven: 39 mmHg (ref 30.0–45.0)

## 2010-12-12 LAB — POCT I-STAT 3, ART BLOOD GAS (G3+)
Acid-Base Excess: 1 mmol/L (ref 0.0–2.0)
Bicarbonate: 26.3 mEq/L — ABNORMAL HIGH (ref 20.0–24.0)
O2 Saturation: 96 %
TCO2: 28 mmol/L (ref 0–100)
pCO2 arterial: 45.1 mmHg — ABNORMAL HIGH (ref 35.0–45.0)
pH, Arterial: 7.374 (ref 7.350–7.450)
pO2, Arterial: 81 mmHg (ref 80.0–100.0)

## 2010-12-12 LAB — POCT I-STAT GLUCOSE
Glucose, Bld: 117 mg/dL — ABNORMAL HIGH (ref 70–99)
Operator id: 141321

## 2011-02-07 NOTE — Op Note (Signed)
[name], [name] NO.:  [id number]   MEDICAL RECORD NO.:  [id number]          PATIENT TYPE:  AMB   LOCATION:  DSC                          FACILITY:  [facility]   PHYSICIAN:  Katy Fitch. Sypher, M.D. 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   OPERATION:  Resection of complex ganglion including dissection of  adventitia of radial artery and superficial branch of radial artery to  palm with debridement of scaphotrapeziotrapezoid joint capsule and  electrocautery of capsule.   OPERATING SURGEON:  Katy Fitch. Sypher, MD   ASSISTANT:  Curtis Rusk, PA-C   ANESTHESIA:  General by LMA.  Supervising anesthesiologist is Dr.  Jacklynn Bue.   INDICATIONS:  Curtis Price is a 65 year old gentleman referred  through the courtesy of Dr. Marlowe Shores for evaluation and management  of a complex and painful right volar ganglion cyst.  Clinical  examination revealed a clinically well 65 year old gentleman with a  complex volar ganglion.  X-ray of the wrist documented STT arthrosis,  which could be a driving factor behind the cyst formation.   The cyst was clearly involving the wall of the radial artery.   We advised the potential risks and benefits of surgery.  Curtis Price  elected to proceed to the OR at this time.   Preoperatively, he was transparently counseled that there is a high risk  of recurrence of this cyst.  These volar ganglion appeared to the ones  that involved the wall of the radial artery are challenging to  eradicate.  We will not sacrifice  the radial artery.   PROCEDURE:  Curtis Price was brought to the operating room and placed  in a supine position on the operating table.   Following the induction of general anesthesia by LMA technique, the left  arm was prepped with Betadine soap solution and sterilely draped.  A  pneumatic tourniquet was applied to the proximal right brachium.   Following exsanguination of the left arm with an Esmarch bandage,  arterial tourniquet was inflated to 220 mmHg.  Procedure commenced with  a Brunner zigzag incision to provide extensile exposure of the arterial  structures.  The radial artery proper was dissected in the distal  forearm and distally through the mass.  This myxomatous mass was  infiltrative involving the adventitia of the radial artery proper, the  superficial branch but did not involve the dorsal branch.  The venae  comitantes of the artery were intimately involved and were resected  along with the mass.  A large caliber vein crossing the mass was suture  ligated.   A small portion of the cyst wall was left on the superficial branch of  the radial artery in that with  further dissection we would likely have  violated the media and/or intima of the artery.   This was followed to the capsule of the STT joint.  It was removed with  a rongeur directly from the volar radiocarpal ligaments.  Bleeding  points were electrocauterized with bipolar current followed by release  of the tourniquet.  The radial artery and its branches were intact.  The  venae comitantes were electrocauterized.  The capsule was  electrocauterized the site of a joint fluid leak noted.   The wound was then repaired with subcutaneous suture of 4-0 Vicryl and  intradermal 3-0 Prolene with Steri-Strips.  A compressive dressing was  applied with a volar plaster splint.   There were no apparent complications.   Curtis Price tolerated the surgery and anesthesia well.  He was  transferred to the recovery room  in stable vital signs.  He will be  discharged with prescription for Vicodin 5 mg 1 p.o. q.4-6 h. p.r.n.  pain, #20 tablets without refill.  We will see him back for followup in  1 week or sooner if problems.       Katy Fitch Sypher, M.D.  Electronically Signed     RVS/MEDQ  D:  06/05/2008  T:  06/06/2008  Job:  [id number]   cc:   Lucky Cowboy, M.D.

## 2011-02-10 NOTE — Op Note (Signed)
NAMEABDULAHAD, Curtis Price NO.:  [id number]   MEDICAL RECORD NO.:  [id number]          PATIENT TYPE:  AMB   LOCATION:  DAY                          FACILITY:  [facility]   PHYSICIAN:  Ollen Gross. Vernell Morgans, M.D. DATE OF BIRTH:  1946-07-03   DATE OF PROCEDURE:  08/25/2005  DATE OF DISCHARGE:  08/25/2005                                 OPERATIVE REPORT   PREOPERATIVE DIAGNOSIS:  Bilateral inguinal hernias.   POSTOPERATIVE DIAGNOSIS:  Bilateral direct inguinal hernias.   PROCEDURE:  Bilateral inguinal hernia repairs with mesh.   SURGEON:  Dr. Carolynne Edouard.   ANESTHESIA:  General endotracheal.   DESCRIPTION OF PROCEDURE:  After informed consent was obtained, the patient  was brought to the operating room and placed in a supine position on the  operating room table. After adequate induction of general anesthesia, the  patient's abdomen and both groins were prepped with Betadine and draped in  the usual sterile manner. The right groin was addressed first. The right  inguinal region was infiltrated with a 0.25% Marcaine. A small incision was  made from the edge of the pubic tubercle on the right towards the anterior  cephalic spine for a distance of about 5 to 6 cm. This incision was carried  down through the skin and subcutaneous tissue sharply with the  electrocautery. A small bridging vein was encountered that was clamped with  hemostats, divided and ligated with 3-0 silk ties. The dissection was then  carried through the rest of the subcutaneous tissue sharply with the  electrocautery until the fascia of the external oblique was encountered. A  Weitlaner retractor was deployed, the fascia of the external oblique was  opened along its fibers towards the apex of the external ring with  Metzenbaum scissors. Blunt dissection was then carried out at the edge of  the pubic tubercle until the cord structures could be surrounded between two  fingers. A 1/2-inch Penrose drain was then placed  around the cord structures  for retraction purposes. The cord structures were gently skeletonized by a  combination of blunt hemostat dissection and sharp dissection with the  electrocautery. No hernia sac was identified in the cord. There was obvious  weakness to the medial part of the floor of the inguinal canal. This was  repaired with a interrupted #0 Vicryl stitch. A piece of a 3 x 6 Ultrapro  mesh was then chosen and cut to fit. The mesh was sewed inferiorly to the  shelving edge of inguinal ligament with a running 2-0 Prolene stitch. The  tails were cut in the mesh laterally and the tails were wrapped around the  cord structures. The mesh was sewed superiorly to the muscular aponeurotic  strength layer of the transversalis with interrupted 2-0 Prolene vertical  mattress stitches. The tails of the mesh were anchored laterally to the  shelving edge of the inguinal ligament with an interrupted 2-0 Prolene  stitch. Once this was accomplished, the mesh appeared to be in good position  without any tension. The wound was irrigated with copious amounts of saline.  The fascia of the  external oblique was then reapproximated with a running 2-  0 Vicryl stitch. The subcutaneous fascia was closed with a running 3-0  Vicryl stitch and the skin was closed with a running 4-0 Monocryl  subcuticular stitch. Next attention was turned to the left groin. Again the  left groin was infiltrated with 0.25% Marcaine. A small incision was made  from the edge of the pubic tubercle on the left towards the anterior  cephalic spine for a distance of about 5-6 cm. This incision was then  carried down through the skin and subcutaneous tissue sharply with the  electrocautery. A small bridging vein was encountered that was clamped with  hemostats, divided and ligated with 3-0 silk ties. The rest of the  dissection was carried sharply through the subcutaneous tissue with the  electrocautery down to the fascia of the  external oblique. The fascia of the  external oblique was opened along its fibers towards the apex of the  external ring with Metzenbaum scissors. Blunt dissection was then carried  out in this area until the cord structures could be surrounded between two  fingers. A 1/2-inch Penrose drain was then placed around the cord structures  for retraction purposes. The cord structures were then gently skeletonized  by a blunt hemostat dissection and sharp dissection with the electrocautery.  No hernia sac was identified in the cord structures. There was a definite  weakness to the floor medially, but the edges of this were less well defined  than on the other side, therefore a piece of 3 x 6 Ultrapro mesh was chosen  and cut to fit. The mesh was sewed inferiorly to the shelving edge of the  inguinal ligament with a running 2-0 Prolene stitch. Tails were cut in the  mesh laterally. The tails were wrapped around the cord structures. The mesh  was sewed superiorly to the muscular aponeurotic strength layer of the  transversalis with interrupted 2-0 Prolene vertical mattress stitches and  the tails of the mesh were anchored laterally to the shelving edge of the  inguinal ligament with an interrupted 2-0 Prolene stitch. The wound was then  irrigated with copious amounts of saline. The mesh appeared to be in good  position without any tension. On both sides, the ilioinguinal nerve was  identified and was involved with scar tissue. It was therefore clamped both  proximally and distally with hemostats, divided and ligated with 3-0 silk  ties. The fascia of the external oblique on the left was then reapproximated  with a running 2-0 Vicryl stitch. The subcutaneous fascia was closed with a  running 3-0 Vicryl stitch and the skin was closed with a running 4-0  Monocryl subcuticular stitch. Benzoin, Steri-Strips and sterile dressings  were applied to both wounds. The patient tolerated the procedure well.  At the end of the case, all needle, sponge and instrument counts were correct.  The patient's testicles were in the scrotum at the end of the case on both  sides and the patient was awakened and taken to recovery in stable  condition.      Ollen Gross. Vernell Morgans, M.D.  Electronically Signed     PST/MEDQ  D:  08/29/2005  T:  08/30/2005  Job:  [id number]

## 2011-04-03 ENCOUNTER — Encounter: Payer: Self-pay | Admitting: Cardiology

## 2011-06-28 LAB — BASIC METABOLIC PANEL
BUN: 19
CO2: 27
Calcium: 9.2
Chloride: 107
Creatinine, Ser: 1.09
GFR calc Af Amer: >60
GFR calc non Af Amer: >60
Glucose, Bld: 107 — ABNORMAL HIGH
Potassium: 4.1
Sodium: 141

## 2011-06-28 LAB — POCT HEMOGLOBIN-HEMACUE: Hemoglobin: 19.1 — ABNORMAL HIGH

## 2011-09-05 ENCOUNTER — Other Ambulatory Visit: Payer: Self-pay | Admitting: Internal Medicine

## 2011-09-05 DIAGNOSIS — M79669 Pain in unspecified lower leg: Secondary | ICD-10-CM

## 2011-09-06 ENCOUNTER — Ambulatory Visit
Admission: RE | Admit: 2011-09-06 | Discharge: 2011-09-06 | Disposition: A | Discharge status: Home / Self Care | Payer: 59 | Source: Ambulatory Visit | Attending: Internal Medicine | Admitting: Internal Medicine

## 2011-09-06 DIAGNOSIS — M79669 Pain in unspecified lower leg: Secondary | ICD-10-CM

## 2012-05-22 ENCOUNTER — Encounter: Payer: Self-pay | Admitting: Gastroenterology

## 2013-01-07 ENCOUNTER — Encounter: Payer: Self-pay | Admitting: Gastroenterology

## 2013-09-03 ENCOUNTER — Encounter: Payer: Self-pay | Admitting: Internal Medicine

## 2013-09-03 DIAGNOSIS — N400 Enlarged prostate without lower urinary tract symptoms: Secondary | ICD-10-CM | POA: Insufficient documentation

## 2013-09-03 DIAGNOSIS — E291 Testicular hypofunction: Secondary | ICD-10-CM | POA: Insufficient documentation

## 2013-09-03 DIAGNOSIS — M797 Fibromyalgia: Secondary | ICD-10-CM | POA: Insufficient documentation

## 2013-09-03 DIAGNOSIS — E782 Mixed hyperlipidemia: Secondary | ICD-10-CM | POA: Insufficient documentation

## 2013-09-03 DIAGNOSIS — K219 Gastro-esophageal reflux disease without esophagitis: Secondary | ICD-10-CM | POA: Insufficient documentation

## 2013-09-03 DIAGNOSIS — E559 Vitamin D deficiency, unspecified: Secondary | ICD-10-CM | POA: Insufficient documentation

## 2013-09-04 ENCOUNTER — Ambulatory Visit (INDEPENDENT_AMBULATORY_CARE_PROVIDER_SITE_OTHER): Payer: Medicare Other | Admitting: Emergency Medicine

## 2013-09-04 VITALS — BP 122/70 | HR 58 | Temp 98.0°F | Resp 18 | Ht 70.0 in | Wt 209.0 lb

## 2013-09-04 DIAGNOSIS — R300 Dysuria: Secondary | ICD-10-CM

## 2013-09-04 LAB — CBC WITH DIFFERENTIAL/PLATELET
Basophils Absolute: 0 10*3/uL (ref 0.0–0.1)
Basophils Relative: 1 % (ref 0–1)
Eosinophils Absolute: 0.2 10*3/uL (ref 0.0–0.7)
Eosinophils Relative: 4 % (ref 0–5)
HCT: 50.3 % (ref 39.0–52.0)
Hemoglobin: 18.3 g/dL — ABNORMAL HIGH (ref 13.0–17.0)
Lymphocytes Relative: 20 % (ref 12–46)
Lymphs Abs: 1.2 10*3/uL (ref 0.7–4.0)
MCH: 33.9 pg (ref 26.0–34.0)
MCHC: 36.4 g/dL — ABNORMAL HIGH (ref 30.0–36.0)
MCV: 93.1 fL (ref 78.0–100.0)
Monocytes Absolute: 0.8 10*3/uL (ref 0.1–1.0)
Monocytes Relative: 14 % — ABNORMAL HIGH (ref 3–12)
Neutro Abs: 3.7 10*3/uL (ref 1.7–7.7)
Neutrophils Relative %: 61 % (ref 43–77)
Platelets: 161 10*3/uL (ref 150–400)
RBC: 5.4 MIL/uL (ref 4.22–5.81)
RDW: 13.2 % (ref 11.5–15.5)
WBC: 5.8 10*3/uL (ref 4.0–10.5)

## 2013-09-04 MED ORDER — TAMSULOSIN HCL 0.4 MG PO CAPS: 0.4000 mg | ORAL_CAPSULE | Freq: Every day | ORAL | Status: DC

## 2013-09-04 MED ORDER — CIPROFLOXACIN HCL 500 MG PO TABS: 500.0000 mg | ORAL_TABLET | Freq: Two times a day (BID) | ORAL | Status: AC

## 2013-09-04 NOTE — Patient Instructions (Signed)
Urinary Tract Infection A urinary tract infection (UTI) can occur any place along the urinary tract. The tract includes the kidneys, ureters, bladder, and urethra. A type of germ called bacteria often causes a UTI. UTIs are often helped with antibiotic medicine.  HOME CARE   If given, take antibiotics as told by your doctor. Finish them even if you start to feel better.  Drink enough fluids to keep your pee (urine) clear or pale yellow.  Avoid tea, drinks with caffeine, and bubbly (carbonated) drinks.  Pee often. Avoid holding your pee in for a long time.  Pee before and after having sex (intercourse).  Wipe from front to back after you poop (bowel movement) if you are a woman. Use each tissue only once. GET HELP RIGHT AWAY IF:   You have back pain.  You have lower belly (abdominal) pain.  You have chills.  You feel sick to your stomach (nauseous).  You throw up (vomit).  Your burning or discomfort with peeing does not go away.  You have a fever.  Your symptoms are not better in 3 days. MAKE SURE YOU:   Understand these instructions.  Will watch your condition.  Will get help right away if you are not doing well or get worse. Document Released: 02/28/2008 Document Revised: 06/05/2012 Document Reviewed: 04/11/2012 [facility] Patient Information 2014 [place], [place]. Kidney Stones Kidney stones (urolithiasis) are solid masses that form inside your kidneys. The intense pain is caused by the stone moving through the kidney, ureter, bladder, and urethra (urinary tract). When the stone moves, the ureter starts to spasm around the stone. The stone is usually passed in your pee (urine).  HOME CARE  Drink enough fluids to keep your pee clear or pale yellow. This helps to get the stone out.  Strain all pee through the provided strainer. Do not pee without peeing through the strainer, not even once. If you pee the stone out, catch it in the strainer. The stone may be as small as a  grain of salt. Take this to your doctor. This will help your doctor figure out what you can do to try to prevent more kidney stones.  Only take medicine as told by your doctor.  Follow up with your doctor as told.  Get follow-up X-rays as told by your doctor. GET HELP IF: You have pain that gets worse even if you have been taking pain medicine. GET HELP RIGHT AWAY IF:   Your pain does not get better with medicine.  You have a fever or shaking chills.  Your pain increases and gets worse over 18 hours.  You have new belly (abdominal) pain.  You feel faint or pass out.  You are unable to pee. MAKE SURE YOU:   Understand these instructions.  Will watch your condition.  Will get help right away if you are not doing well or get worse. Document Released: 02/28/2008 Document Revised: 05/14/2013 Document Reviewed: 02/12/2013 [facility] Patient Information 2014 [place], [place].

## 2013-09-05 LAB — URINALYSIS, ROUTINE W REFLEX MICROSCOPIC
Bilirubin Urine: NEGATIVE
Glucose, UA: NEGATIVE mg/dL
Hgb urine dipstick: NEGATIVE
Ketones, ur: NEGATIVE mg/dL
Leukocytes, UA: NEGATIVE
Nitrite: NEGATIVE
Protein, ur: NEGATIVE mg/dL
Specific Gravity, Urine: 1.016 (ref 1.005–1.030)
Urobilinogen, UA: 1 mg/dL (ref 0.0–1.0)
pH: 6.5 (ref 5.0–8.0)

## 2013-09-06 LAB — URINE CULTURE
Colony Count: NO GROWTH
Organism ID, Bacteria: NO GROWTH

## 2013-09-07 ENCOUNTER — Encounter: Payer: Self-pay | Admitting: Emergency Medicine

## 2013-09-07 NOTE — Progress Notes (Signed)
Subjective:    Patient ID: Curtis Price, male    DOB: 07/15/1946, 67 y.o.   MRN: [id number]  HPI Comments: 67 yo male with history of kidney stones over 20 years ago presents with difficulty and mild discomfort with urine. He also notes low abdomen pain R > L on / off x months. He has 5/10 of pain at worse. He denies fever or hematuria.   Current Outpatient Prescriptions on File Prior to Visit  Medication Sig Dispense Refill  . aspirin (ASPIR-LOW) 81 MG EC tablet Take 81 mg by mouth daily.        Marland Kitchen buPROPion (WELLBUTRIN XL) 300 MG 24 hr tablet Take 300 mg by mouth daily.        . calcium-vitamin D (OSCAL) 250-125 MG-UNIT per tablet Take 1 tablet by mouth. Take 3 times per week.       . celecoxib (CELEBREX) 200 MG capsule Take 200 mg by mouth 2 (two) times daily.        . colchicine (COLCRYS) 0.6 MG tablet Take 0.6 mg by mouth daily.        Marland Kitchen esomeprazole (NEXIUM) 40 MG capsule Take 40 mg by mouth daily before breakfast.        . fish oil-omega-3 fatty acids 1000 MG capsule Take 1 capsule by mouth 2 (two) times daily.        . Magnesium 250 MG TABS Take 1 tablet by mouth 2 (two) times daily.        . Multiple Vitamin (MULTIVITAMIN) tablet Take 1 tablet by mouth daily.        . Multiple Vitamins-Minerals (STRESS B COMPLEX/ZINC) TABS Take 1 tablet by mouth daily.        . rosuvastatin (CRESTOR) 5 MG tablet Take 5 mg by mouth every other day.        . rosuvastatin (CRESTOR) 5 MG tablet Take 5 mg by mouth every other day.        . tadalafil (CIALIS) 20 MG tablet Take 20 mg by mouth daily as needed.        . Testosterone (ANDROGEL PUMP) 1.25 GM/ACT (1%) GEL Place 1 puff onto the skin daily.        . valsartan-hydrochlorothiazide (DIOVAN-HCT) 320-25 MG per tablet Take 1 tablet by mouth daily.         No current facility-administered medications on file prior to visit.   ALLERGIES Atenolol; Atorvastatin; Darvocet; Paroxetine; Pregabalin; and Propoxyphene hcl  Past Medical History   Diagnosis Date  . BPH (benign prostatic hypertrophy)   . GERD (gastroesophageal reflux disease)   . Fibromyalgia   . Hypogonadism male   . History of epididymitis   . Myofascial pain syndrome   . Nephrolithiasis   . Prostatitis     Recurrent  . Prolapsed internal hemorrhoids   . History of head injury without skull fracture 1980    Concussion  . Tendinitis 2000    Hx of left bicep  . Rheumatic fever     x3 at ages [age] and 89; No significant valvular abnormality on 5/11 echo  . Exertional dyspnea     ETT myoview 5/11: 8 min, no ischemic ECG changes, stopped due to fatigue/ SOB. EF 60%, no ischemia or infarction. Echo 5/11 EF 60%, no significant valvular abnormalities, mild RV dilation and dysfunction, no complete TR doppler jet so PA pressure hard to gauge. 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 R heart cath 6/11: mean RA  10 mmHg, PA 25/18, mean PCWP 13 mmHg, CI 2.1  . Hypertension   . Hyperlipidemia     Myalgias with Lipitor  . Vitamin D deficiency      Review of Systems  Gastrointestinal: Positive for abdominal pain.  Genitourinary: Positive for dysuria and difficulty urinating.  All other systems reviewed and are negative.    BP 122/70  Pulse 58  Temp(Src) 98 F (36.7 C) (Temporal)  Resp 18  Ht 5\' 10"  (1.778 m)  Wt 209 lb (94.802 kg)  BMI 29.99 kg/m2     Objective:   Physical Exam  Nursing note and vitals reviewed. Constitutional: He is oriented to person, place, and time. He appears well-developed and well-nourished.  HENT:  Head: Normocephalic and atraumatic.  Right Ear: External ear normal.  Left Ear: External ear normal.  Nose: Nose normal.  Eyes: Conjunctivae and EOM are normal.  Neck: Normal range of motion. Neck supple. No JVD present. No thyromegaly present.  Cardiovascular: Normal rate, regular rhythm, normal heart sounds and intact distal pulses.   Pulmonary/Chest: Effort normal and breath sounds normal.  Abdominal:  Soft. Bowel sounds are normal. He exhibits no distension and no mass. There is tenderness. There is no rebound and no guarding.  Mild suprapubic, NEG CVA   Musculoskeletal: Normal range of motion. He exhibits no edema and no tenderness.  Lymphadenopathy:    He has no cervical adenopathy.  Neurological: He is alert and oriented to person, place, and time. He has normal reflexes. No cranial nerve deficit. Coordination normal.  Skin: Skin is warm and dry.  Psychiatric: He has a normal mood and affect. His behavior is normal. Judgment and thought content normal.          Assessment & Plan:  Dysuria- ? UTI vs stone- Check labs, push fluids. Cipro 500 mg and Flomax AD. If increase SX ER. May need Ct scan

## 2013-10-16 ENCOUNTER — Other Ambulatory Visit: Payer: Self-pay | Admitting: Internal Medicine

## 2013-10-16 MED ORDER — BUPROPION HCL ER (XL) 300 MG PO TB24: 300.0000 mg | ORAL_TABLET | Freq: Every day | ORAL | Status: DC

## 2013-10-16 MED ORDER — TESTOSTERONE 12.5 MG/ACT (1%) TD GEL: TRANSDERMAL | Status: DC

## 2013-10-16 MED ORDER — PANTOPRAZOLE SODIUM 40 MG PO TBEC: 40.0000 mg | DELAYED_RELEASE_TABLET | Freq: Every day | ORAL | Status: DC

## 2013-10-17 ENCOUNTER — Other Ambulatory Visit: Payer: Self-pay | Admitting: Internal Medicine

## 2013-10-23 ENCOUNTER — Encounter: Payer: Self-pay | Admitting: Physician Assistant

## 2013-10-23 ENCOUNTER — Ambulatory Visit (INDEPENDENT_AMBULATORY_CARE_PROVIDER_SITE_OTHER): Payer: Medicare Other | Admitting: Physician Assistant

## 2013-10-23 VITALS — BP 138/80 | HR 72 | Temp 97.7°F | Resp 16 | Ht 70.0 in | Wt 214.0 lb

## 2013-10-23 DIAGNOSIS — Z79899 Other long term (current) drug therapy: Secondary | ICD-10-CM

## 2013-10-23 DIAGNOSIS — E559 Vitamin D deficiency, unspecified: Secondary | ICD-10-CM

## 2013-10-23 DIAGNOSIS — E785 Hyperlipidemia, unspecified: Secondary | ICD-10-CM

## 2013-10-23 DIAGNOSIS — I10 Essential (primary) hypertension: Secondary | ICD-10-CM

## 2013-10-23 DIAGNOSIS — E782 Mixed hyperlipidemia: Secondary | ICD-10-CM

## 2013-10-23 DIAGNOSIS — H6191 Disorder of right external ear, unspecified: Secondary | ICD-10-CM

## 2013-10-23 DIAGNOSIS — E291 Testicular hypofunction: Secondary | ICD-10-CM

## 2013-10-23 LAB — BASIC METABOLIC PANEL WITH GFR
BUN: 19 mg/dL (ref 6–23)
CO2: 25 mEq/L (ref 19–32)
Calcium: 9.2 mg/dL (ref 8.4–10.5)
Chloride: 106 mEq/L (ref 96–112)
Creat: 1.09 mg/dL (ref 0.50–1.35)
GFR, Est African American: 81 mL/min
GFR, Est Non African American: 70 mL/min
Glucose, Bld: 108 mg/dL — ABNORMAL HIGH (ref 70–99)
Potassium: 4.4 mEq/L (ref 3.5–5.3)
Sodium: 140 mEq/L (ref 135–145)

## 2013-10-23 LAB — CBC WITH DIFFERENTIAL/PLATELET
Basophils Absolute: 0 10*3/uL (ref 0.0–0.1)
Basophils Relative: 1 % (ref 0–1)
Eosinophils Absolute: 0.3 10*3/uL (ref 0.0–0.7)
Eosinophils Relative: 5 % (ref 0–5)
HCT: 49.4 % (ref 39.0–52.0)
Hemoglobin: 18.3 g/dL — ABNORMAL HIGH (ref 13.0–17.0)
Lymphocytes Relative: 23 % (ref 12–46)
Lymphs Abs: 1.1 10*3/uL (ref 0.7–4.0)
MCH: 33.3 pg (ref 26.0–34.0)
MCHC: 37 g/dL — ABNORMAL HIGH (ref 30.0–36.0)
MCV: 90 fL (ref 78.0–100.0)
Monocytes Absolute: 0.6 10*3/uL (ref 0.1–1.0)
Monocytes Relative: 13 % — ABNORMAL HIGH (ref 3–12)
Neutro Abs: 2.9 10*3/uL (ref 1.7–7.7)
Neutrophils Relative %: 58 % (ref 43–77)
Platelets: 165 10*3/uL (ref 150–400)
RBC: 5.49 MIL/uL (ref 4.22–5.81)
RDW: 13.6 % (ref 11.5–15.5)
WBC: 5 10*3/uL (ref 4.0–10.5)

## 2013-10-23 LAB — LIPID PANEL
Cholesterol: 216 mg/dL — ABNORMAL HIGH (ref 0–200)
HDL: 46 mg/dL (ref >39)
LDL Cholesterol: 137 mg/dL — ABNORMAL HIGH (ref 0–99)
Total CHOL/HDL Ratio: 4.7 Ratio
Triglycerides: 166 mg/dL — ABNORMAL HIGH (ref <150)
VLDL: 33 mg/dL (ref 0–40)

## 2013-10-23 LAB — HEPATIC FUNCTION PANEL
ALT: 33 U/L (ref 0–53)
AST: 23 U/L (ref 0–37)
Albumin: 4 g/dL (ref 3.5–5.2)
Alkaline Phosphatase: 48 U/L (ref 39–117)
Bilirubin, Direct: 0.2 mg/dL (ref 0.0–0.3)
Indirect Bilirubin: 0.6 mg/dL (ref 0.2–1.2)
Total Bilirubin: 0.8 mg/dL (ref 0.2–1.2)
Total Protein: 6.2 g/dL (ref 6.0–8.3)

## 2013-10-23 LAB — TSH: TSH: 3.314 u[IU]/mL (ref 0.350–4.500)

## 2013-10-23 LAB — MAGNESIUM: Magnesium: 2.1 mg/dL (ref 1.5–2.5)

## 2013-10-23 MED ORDER — TRAMADOL HCL 50 MG PO TABS: 50.0000 mg | ORAL_TABLET | Freq: Four times a day (QID) | ORAL | Status: DC | PRN

## 2013-10-23 MED ORDER — CELECOXIB 200 MG PO CAPS: 200.0000 mg | ORAL_CAPSULE | Freq: Two times a day (BID) | ORAL | Status: DC

## 2013-10-23 NOTE — Patient Instructions (Signed)
Fibromyalgia Fibromyalgia is a disorder that is often misunderstood. It is associated with muscular pains and tenderness that comes and goes. It is often associated with fatigue and sleep disturbances. Though it tends to be long-lasting, fibromyalgia is not life-threatening. CAUSES  The exact cause of fibromyalgia is unknown. People with certain gene types are predisposed to developing fibromyalgia and other conditions. Certain factors can play a role as triggers, such as:  Spine disorders.  Arthritis.  Severe injury (trauma) and other physical stressors.  Emotional stressors. SYMPTOMS   The main symptom is pain and stiffness in the muscles and joints, which can vary over time.  Sleep and fatigue problems. Other related symptoms may include:  Bowel and bladder problems.  Headaches.  Visual problems.  Problems with odors and noises.  Depression or mood changes.  Painful periods (dysmenorrhea).  Dryness of the skin or eyes. DIAGNOSIS  There are no specific tests for diagnosing fibromyalgia. Patients can be diagnosed accurately from the specific symptoms they have. The diagnosis is made by determining that nothing else is causing the problems. TREATMENT  There is no cure. Management includes medicines and an active, healthy lifestyle. The goal is to enhance physical fitness, decrease pain, and improve sleep. HOME CARE INSTRUCTIONS   Only take over-the-counter or prescription medicines as directed by your caregiver. Sleeping pills, tranquilizers, and pain medicines may make your problems worse.  Low-impact aerobic exercise is very important and advised for treatment. At first, it may seem to make pain worse. Gradually increasing your tolerance will overcome this feeling.  Learning relaxation techniques and how to control stress will help you. Biofeedback, visual imagery, hypnosis, muscle relaxation, yoga, and meditation are all options.  Anti-inflammatory medicines and  physical therapy may provide short-term help.  Acupuncture or massage treatments may help.  Take muscle relaxant medicines as suggested by your caregiver.  Avoid stressful situations.  Plan a healthy lifestyle. This includes your diet, sleep, rest, exercise, and friends.  Find and practice a hobby you enjoy.  Join a fibromyalgia support group for interaction, ideas, and sharing advice. This may be helpful. SEEK MEDICAL CARE IF:  You are not having good results or improvement from your treatment. FOR MORE INFORMATION  National Fibromyalgia Association: www.fmaware.[place]: www.arthritis.org Document Released: 09/11/2005 Document Revised: 12/04/2011 Document Reviewed: 12/22/2009 [facility] Patient Information 2014 [place], [place]. Cholesterol Cholesterol is a white, waxy, fat-like protein needed by your body in small amounts. The liver makes all the cholesterol you need. It is carried from the liver by the blood through the blood vessels. Deposits (plaque) may build up on blood vessel walls. This makes the arteries narrower and stiffer. Plaque increases the risk for heart attack and stroke. You cannot feel your cholesterol level even if it is very high. The only way to know is by a blood test to check your lipid (fats) levels. Once you know your cholesterol levels, you should keep a record of the test results. Work with your caregiver to to keep your levels in the desired range. WHAT THE RESULTS MEAN:  Total cholesterol is a rough measure of all the cholesterol in your blood.  LDL is the so-called bad cholesterol. This is the type that deposits cholesterol in the walls of the arteries. You want this level to be low.  HDL is the good cholesterol because it cleans the arteries and carries the LDL away. You want this level to be high.  Triglycerides are fat that the body can either burn for energy or store.  High levels are closely linked to heart disease. DESIRED  LEVELS:  Total cholesterol below 200.  LDL below 100 for people at risk, below 70 for very high risk.  HDL above 50 is good, above 60 is best.  Triglycerides below 150. HOW TO LOWER YOUR CHOLESTEROL:  Diet.  Choose fish or white meat chicken and [place], roasted or baked. Limit fatty cuts of red meat, fried foods, and processed meats, such as sausage and lunch meat.  Eat lots of fresh fruits and vegetables. Choose whole grains, beans, pasta, potatoes and cereals.  Use only small amounts of olive, corn or canola oils. Avoid butter, mayonnaise, shortening or palm kernel oils. Avoid foods with trans-fats.  Use skim/nonfat milk and low-fat/nonfat yogurt and cheeses. Avoid whole milk, cream, ice cream, egg yolks and cheeses. Healthy desserts include angel food cake, ginger snaps, animal crackers, hard candy, popsicles, and low-fat/nonfat frozen yogurt. Avoid pastries, cakes, pies and cookies.  Exercise.  A regular program helps decrease LDL and raises HDL.  Helps with weight control.  Do things that increase your activity level like gardening, walking, or taking the stairs.  Medication.  May be prescribed by your caregiver to help lowering cholesterol and the risk for heart disease.  You may need medicine even if your levels are normal if you have several risk factors. HOME CARE INSTRUCTIONS   Follow your diet and exercise programs as suggested by your caregiver.  Take medications as directed.  Have blood work done when your caregiver feels it is necessary. MAKE SURE YOU:   Understand these instructions.  Will watch your condition.  Will get help right away if you are not doing well or get worse. Document Released: 06/06/2001 Document Revised: 12/04/2011 Document Reviewed: 06/25/2013 [facility] Patient Information 2014 [place], [place].

## 2013-10-23 NOTE — Progress Notes (Signed)
HPI Patient presents for 3 month follow up with hypertension, hyperlipidemia, prediabetes and vitamin D. Patient's blood pressure has been controlled at home, today their BP is BP: 138/80 mmHg  Patient denies chest pain, shortness of breath, dizziness.  Patient's cholesterol is diet controlled. In addition they are on crestor and denies myalgias. The cholesterol last visit was LDL 88.  The patient has been working on diet and exercise for prediabetes, and denies changes in vision, polys, and paresthesias. A1C 5.4 Patient is on Vitamin D supplement.   Treated for possible prostatitis with 10 days of Cipro on 09/04/13 in the office. Negative urine culture. He denies any more urinary symptoms or AB pain. He complains of hurting all over and stiffness in the morning, better after a few minutes, and states his "bones" hurt.   Current Medications:  Current Outpatient Prescriptions on File Prior to Visit  Medication Sig Dispense Refill  . aspirin (ASPIR-LOW) 81 MG EC tablet Take 81 mg by mouth daily.        Marland Kitchen buPROPion (WELLBUTRIN XL) 300 MG 24 hr tablet Take 1 tablet (300 mg total) by mouth daily. For mood  90 tablet  99  . calcium-vitamin D (OSCAL) 250-125 MG-UNIT per tablet Take 1 tablet by mouth. Take 3 times per week.       . celecoxib (CELEBREX) 200 MG capsule Take 200 mg by mouth 2 (two) times daily.        . colchicine (COLCRYS) 0.6 MG tablet Take 0.6 mg by mouth daily.        . fish oil-omega-3 fatty acids 1000 MG capsule Take 1 capsule by mouth 2 (two) times daily.        . Magnesium 250 MG TABS Take 1 tablet by mouth 2 (two) times daily.        . Multiple Vitamins-Minerals (STRESS B COMPLEX/ZINC) TABS Take 1 tablet by mouth daily.        . pantoprazole (PROTONIX) 40 MG tablet Take 1 tablet (40 mg total) by mouth daily. For acid reflux and indigestion  90 tablet  99  . rosuvastatin (CRESTOR) 5 MG tablet Take 5 mg by mouth every other day.        . tadalafil (CIALIS) 20 MG tablet Take 20 mg by  mouth daily as needed.        . tamsulosin (FLOMAX) 0.4 MG CAPS capsule Take 1 capsule (0.4 mg total) by mouth daily.  30 capsule  1  . Testosterone (ANDROGEL PUMP) 12.5 MG/ACT (1%) GEL Apply 2 pumps daily  3 Bottle  3  . valsartan-hydrochlorothiazide (DIOVAN-HCT) 320-25 MG per tablet Take 1 tablet by mouth daily.         No current facility-administered medications on file prior to visit.   Medical History:  Past Medical History  Diagnosis Date  . BPH (benign prostatic hypertrophy)   . GERD (gastroesophageal reflux disease)   . Fibromyalgia   . Hypogonadism male   . History of epididymitis   . Myofascial pain syndrome   . Nephrolithiasis   . Prostatitis     Recurrent  . Prolapsed internal hemorrhoids   . History of head injury without skull fracture 1980    Concussion  . Tendinitis 2000    Hx of left bicep  . Rheumatic fever     x3 at ages [age] and 53; No significant valvular abnormality on 5/11 echo  . Exertional dyspnea     ETT myoview 5/11: 8 min, no ischemic ECG changes, stopped  due to fatigue/ SOB. EF 60%, no ischemia or infarction. Echo 5/11 EF 60%, no significant valvular abnormalities, mild RV dilation and dysfunction, no complete TR doppler jet so PA pressure hard to gauge. 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 R heart cath 6/11: mean RA 10 mmHg, PA 25/18, mean PCWP 13 mmHg, CI 2.1  . Hypertension   . Hyperlipidemia     Myalgias with Lipitor  . Vitamin D deficiency    Allergies:  Allergies  Allergen Reactions  . Atenolol   . Atorvastatin   . Darvocet [Propoxyphene N-Acetaminophen]   . Paroxetine   . Pregabalin   . Propoxyphene Hcl     ROS Constitutional: Denies fever, chills, headaches, insomnia, fatigue, night sweats Eyes: Denies redness, blurred vision, diplopia, discharge, itchy, watery eyes.  ENT: Denies congestion, post nasal drip, sore throat, earache, dental pain, Tinnitus, Vertigo, Sinus pain, snoring.  Cardio: Denies chest  pain, palpitations, irregular heartbeat, dyspnea, diaphoresis, orthopnea, PND, claudication, edema Respiratory: denies cough, shortness of breath, wheezing.  Gastrointestinal: Denies dysphagia, heartburn, AB pain/ cramps, N/V, diarrhea, constipation, hematemesis, melena, hematochezia,  hemorrhoids Genitourinary: Denies dysuria, frequency, urgency, nocturia, hesitancy, discharge, hematuria, flank pain Musculoskeletal: + myaglia, stiffness, pain declines swelling and strain/sprain. Skin: Denies pruritis, rash, changing in skin lesion Neuro: Denies Weakness, tremor, incoordination, spasms, pain Psychiatric: Denies confusion, memory loss, sensory loss Endocrine: Denies change in weight, skin, hair change, nocturia Diabetic Polys, Denies visual blurring, hyper /hypo glycemic episodes, and paresthesia, Heme/Lymph: Denies Excessive bleeding, bruising, enlarged lymph nodes  Family history- Review and unchanged Social history- Review and unchanged Physical Exam: Filed Vitals:   10/23/13 0849  BP: 138/80  Pulse: 72  Temp: 97.7 F (36.5 C)  Resp: 16   Filed Weights   10/23/13 0849  Weight: 214 lb (97.07 kg)   General Appearance: Well nourished, in no apparent distress. Eyes: PERRLA, EOMs, conjunctiva no swelling or erythema Sinuses: No Frontal/maxillary tenderness ENT/Mouth: Ext aud canals clear, TMs without erythema, bulging. No erythema, swelling, or exudate on post pharynx.  Tonsils not swollen or erythematous. Hearing normal.  Neck: Supple, thyroid normal.  Respiratory: Respiratory effort normal, BS equal bilaterally without rales, rhonchi, wheezing or stridor.  Cardio: RRR with no MRGs. Brisk peripheral pulses without edema.  Abdomen: Soft, obese + BS.  Non tender, no guarding, rebound, hernias, masses. Lymphatics: Non tender without lymphadenopathy.  Musculoskeletal: Full ROM, 5/5 strength, normal gait.  Skin: Warm, dry without rashes, lesions, ecchymosis. Has area on his right ear,  had removed in the past, scaly, erythematous Neuro: Cranial nerves intact. Normal muscle tone, no cerebellar symptoms. Sensation intact.  Psych: Awake and oriented X 3, normal affect, Insight and Judgment appropriate.   Assessment and Plan:  Hypertension: Continue medication, monitor blood pressure at home.  Continue DASH diet. Cholesterol: Continue diet and exercise. Check cholesterol.  Pre-diabetes-Continue diet and exercise. Check A1C Vitamin D Def- check level and continue medications.  Fibromyalgia- Try tramadol PRN in the day, exercise/stretch helps, declines PT.  SCC- referral to skin center.   OVER 40 minutes of exam, counseling, chart review, referral performed Continue diet and meds as discussed. Further disposition pending results of labs.  Vicie Mutters 8:54 AM

## 2013-10-24 LAB — VITAMIN D 25 HYDROXY (VIT D DEFICIENCY, FRACTURES): Vit D, 25-Hydroxy: 61 ng/mL (ref 30–89)

## 2013-10-30 ENCOUNTER — Other Ambulatory Visit: Payer: Self-pay | Admitting: Internal Medicine

## 2013-10-30 DIAGNOSIS — E291 Testicular hypofunction: Secondary | ICD-10-CM

## 2013-10-30 MED ORDER — TESTOSTERONE 20.25 MG/1.25GM (1.62%) TD GEL: 4.0000 | Freq: Every day | TRANSDERMAL | Status: DC

## 2013-11-03 ENCOUNTER — Other Ambulatory Visit: Payer: Self-pay | Admitting: Internal Medicine

## 2013-11-03 DIAGNOSIS — M109 Gout, unspecified: Secondary | ICD-10-CM

## 2013-11-03 MED ORDER — COLCHICINE 0.6 MG PO TABS
0.6000 mg | ORAL_TABLET | Freq: Every day | ORAL | Status: AC
Start: 2013-11-03 — End: 2014-11-03

## 2013-11-19 ENCOUNTER — Encounter: Payer: Self-pay | Admitting: Internal Medicine

## 2013-11-19 ENCOUNTER — Ambulatory Visit (INDEPENDENT_AMBULATORY_CARE_PROVIDER_SITE_OTHER): Payer: Medicare Other | Admitting: Internal Medicine

## 2013-11-19 VITALS — BP 140/84 | HR 76 | Temp 97.9°F | Resp 18 | Wt 210.2 lb

## 2013-11-19 DIAGNOSIS — I10 Essential (primary) hypertension: Secondary | ICD-10-CM

## 2013-11-19 DIAGNOSIS — J019 Acute sinusitis, unspecified: Secondary | ICD-10-CM

## 2013-11-19 DIAGNOSIS — E785 Hyperlipidemia, unspecified: Secondary | ICD-10-CM

## 2013-11-19 DIAGNOSIS — J029 Acute pharyngitis, unspecified: Secondary | ICD-10-CM

## 2013-11-19 DIAGNOSIS — E782 Mixed hyperlipidemia: Secondary | ICD-10-CM

## 2013-11-19 DIAGNOSIS — Z79899 Other long term (current) drug therapy: Secondary | ICD-10-CM | POA: Insufficient documentation

## 2013-11-19 MED ORDER — PREDNISONE 20 MG PO TABS: 20.0000 mg | ORAL_TABLET | ORAL | Status: DC

## 2013-11-19 MED ORDER — HYDROCODONE-ACETAMINOPHEN 5-325 MG PO TABS: ORAL_TABLET | ORAL | Status: DC

## 2013-11-19 MED ORDER — AZITHROMYCIN 250 MG PO TABS: ORAL_TABLET | ORAL | Status: AC

## 2013-11-19 NOTE — Progress Notes (Signed)
Subjective:    Patient ID: Curtis Price, male    DOB: 1946/04/09, 68 y.o.   MRN: [id number]  Sore Throat  This is a new problem. The current episode started in the past 7 days. The problem has been waxing and waning. There has been no fever. The pain is mild. Associated symptoms include congestion, coughing and trouble swallowing. Pertinent negatives include no abdominal pain, diarrhea, drooling, ear discharge, ear pain, headaches, hoarse voice, neck pain, shortness of breath, stridor, swollen glands or vomiting. He has tried gargles and NSAIDs for the symptoms. The treatment provided no relief.  Sinusitis This is a new problem. The current episode started in the past 7 days. The problem has been waxing and waning since onset. There has been no fever. The pain is mild. Associated symptoms include congestion, coughing, sinus pressure, sneezing and a sore throat. Pertinent negatives include no ear pain, headaches, hoarse voice, neck pain, shortness of breath or swollen glands.     Medication List       This list is accurate as of: 11/19/13 11:19 AM.  Always use your most recent med list.               ASPIR-LOW 81 MG EC tablet  Generic drug:  aspirin  Take 81 mg by mouth daily.     azithromycin 250 MG tablet  Commonly known as:  ZITHROMAX  Take 2 tablets (500 mg) on  Day 1,  followed by 1 tablet (250 mg) once daily on Days 2 through 5.     buPROPion 300 MG 24 hr tablet  Commonly known as:  WELLBUTRIN XL  Take 1 tablet (300 mg total) by mouth daily. For mood     celecoxib 200 MG capsule  Commonly known as:  CELEBREX  Take 1 capsule (200 mg total) by mouth 2 (two) times daily.     colchicine 0.6 MG tablet  Take 1 tablet (0.6 mg total) by mouth daily.     fish oil-omega-3 fatty acids 1000 MG capsule  Take 1 capsule by mouth 2 (two) times daily.     HYDROcodone-acetaminophen 5-325 MG per tablet  Commonly known as:  NORCO  1/2 to 1 tablet every 3 to 4 hours as needed for cough  or pain     Magnesium 250 MG Tabs  Take 1 tablet by mouth 2 (two) times daily.     pantoprazole 40 MG tablet  Commonly known as:  PROTONIX  Take 1 tablet (40 mg total) by mouth daily. For acid reflux and indigestion     predniSONE 20 MG tablet  Commonly known as:  DELTASONE  Take 1 tablet (20 mg total) by mouth See admin instructions. 1 tab 3 x day for 3 days, then 1 tab 2 x day for 3 days, then 1 tab 1 x day for 5 days     STRESS B COMPLEX/ZINC Tabs  Take 1 tablet by mouth daily.     tamsulosin 0.4 MG Caps capsule  Commonly known as:  FLOMAX     Testosterone 20.25 MG/1.25GM (1.62%) Gel  Commonly known as:  ANDROGEL  Place 4 Squirts onto the skin daily. Daily   - disp 6 bottles (90 day supply) with refills for 6 months     traMADol 50 MG tablet  Commonly known as:  ULTRAM  Take 1 tablet (50 mg total) by mouth every 6 (six) hours as needed.     valsartan-hydrochlorothiazide 320-25 MG per tablet  Commonly known as:  DIOVAN-HCT  Take 1 tablet by mouth daily.       Allergies  Allergen Reactions  . Atenolol   . Atorvastatin   . Darvocet [Propoxyphene N-Acetaminophen]   . Paroxetine   . Pregabalin   . Propoxyphene Hcl    Past Medical History  Diagnosis Date  . BPH (benign prostatic hypertrophy)   . GERD (gastroesophageal reflux disease)   . Fibromyalgia   . Hypogonadism male   . History of epididymitis   . Myofascial pain syndrome   . Nephrolithiasis   . Prostatitis     Recurrent  . Prolapsed internal hemorrhoids   . History of head injury without skull fracture 1980    Concussion  . Tendinitis 2000    Hx of left bicep  . Rheumatic fever     x3 at ages [age] and 50; No significant valvular abnormality on 5/11 echo  . Exertional dyspnea     ETT myoview 5/11: 8 min, no ischemic ECG changes, stopped due to fatigue/ SOB. EF 60%, no ischemia or infarction. Echo 5/11 EF 60%, no significant valvular abnormalities, mild RV dilation and dysfunction, no complete TR doppler  jet so PA pressure hard to gauge. 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 R heart cath 6/11: mean RA 10 mmHg, PA 25/18, mean PCWP 13 mmHg, CI 2.1  . Hypertension   . Hyperlipidemia     Myalgias with Lipitor  . Vitamin D deficiency    Review of Systems  Constitutional: Negative.   HENT: Positive for congestion, postnasal drip, rhinorrhea, sinus pressure, sneezing, sore throat and trouble swallowing. Negative for drooling, ear discharge, ear pain, facial swelling, hoarse voice, mouth sores and nosebleeds.        Bilat sinus pressure  Eyes: Negative.   Respiratory: Positive for cough. Negative for shortness of breath, wheezing and stridor.   Cardiovascular: Negative.   Gastrointestinal: Negative.  Negative for vomiting, abdominal pain and diarrhea.  Endocrine: Negative.   Genitourinary: Negative.   Musculoskeletal: Negative for neck pain.  Skin: Negative.  Negative for color change, pallor and rash.  Neurological: Negative.  Negative for headaches.  Hematological: Negative.   Psychiatric/Behavioral: Negative.        Objective:   Physical Exam  Constitutional: No distress.  HENT:  Mouth/Throat: No oropharyngeal exudate.  Bilat sinus tenderness   Eyes: Conjunctivae and EOM are normal. Pupils are equal, round, and reactive to light.  Neck: Normal range of motion. Neck supple. No thyromegaly present.  Cardiovascular: Normal rate, regular rhythm and normal heart sounds.   No murmur heard. Pulmonary/Chest: Effort normal. No respiratory distress. He has rales.  Abdominal: Soft. Bowel sounds are normal.  Musculoskeletal: Normal range of motion.  Lymphadenopathy:    He has no cervical adenopathy.  Neurological: No cranial nerve deficit.  Skin: Skin is dry. No rash noted. He is not diaphoretic. No erythema.  Psychiatric: He has a normal mood and affect.   Assessment & Plan:  1. Acute pharyngitis - azithromycin (ZITHROMAX) 250 MG tablet; Take 2 tablets (500  mg) on  Day 1,  followed by 1 tablet (250 mg) once daily on Days 2 through 5.  Dispense: 6 each; Refill: 1  2. Acute sinusitis, unspecified - azithromycin (ZITHROMAX) 250 MG tablet; Take 2 tablets (500 mg) on  Day 1,  followed by 1 tablet (250 mg) once daily on Days 2 through 5.  Dispense: 6 each; Refill: 1 - predniSONE (DELTASONE) 20 MG tablet; Take 1 tablet (20 mg  total) by mouth See admin instructions. 1 tab 3 x day for 3 days, then 1 tab 2 x day for 3 days, then 1 tab 1 x day for 5 days  Dispense: 20 tablet; Refill: 0 - HYDROcodone-acetaminophen (NORCO) 5-325 MG per tablet; 1/2 to 1 tablet every 3 to 4 hours as needed for cough or pain  Dispense: 50 tablet; Refill: 0

## 2013-11-19 NOTE — Patient Instructions (Signed)
  Pharyngitis Pharyngitis is a sore throat (pharynx). There is redness, pain, and swelling of your throat. HOME CARE   Drink enough fluids to keep your pee (urine) clear or pale yellow.  Only take medicine as told by your doctor.  You may get sick again if you do not take medicine as told. Finish your medicines, even if you start to feel better.  Do not take aspirin.  Rest.  Rinse your mouth (gargle) with salt water ( tsp of salt per 1 qt of water) every 1 2 hours. This will help the pain.  If you are not at risk for choking, you can suck on hard candy or sore throat lozenges. GET HELP IF:  You have large, tender lumps on your neck.  You have a rash.  You cough up green, yellow-brown, or bloody spit. GET HELP RIGHT AWAY IF:   You have a stiff neck.  You drool or cannot swallow liquids.  You throw up (vomit) or are not able to keep medicine or liquids down.  You have very bad pain that does not go away with medicine.  You have problems breathing (not from a stuffy nose). MAKE SURE YOU:   Understand these instructions.  Will watch your condition.  Will get help right away if you are not doing well or get worse. Document Released: 02/28/2008 Document Revised: 07/02/2013 Document Reviewed: 05/19/2013 [facility] Patient Information 2014 [place].  Sinusitis Sinusitis is redness, soreness, and puffiness (inflammation) of the air pockets in the bones of your face (sinuses). The redness, soreness, and puffiness can cause air and mucus to get trapped in your sinuses. This can allow germs to grow and cause an infection.  HOME CARE   Drink enough fluids to keep your pee (urine) clear or pale yellow.  Use a humidifier in your home.  Run a hot shower to create steam in the bathroom. Sit in the bathroom with the door closed. Breathe in the steam 3 4 times a day.  Put a warm, moist washcloth on your face 3 4 times a day, or as told by your doctor.  Use salt water  sprays (saline sprays) to wet the thick fluid in your nose. This can help the sinuses drain.  Only take medicine as told by your doctor. GET HELP RIGHT AWAY IF:   Your pain gets worse.  You have very bad headaches.  You are sick to your stomach (nauseous).  You throw up (vomit).  You are very sleepy (drowsy) all the time.  Your face is puffy (swollen).  Your vision changes.  You have a stiff neck.  You have trouble breathing. MAKE SURE YOU:   Understand these instructions.  Will watch your condition.  Will get help right away if you are not doing well or get worse. Document Released: 02/28/2008 Document Revised: 06/05/2012 Document Reviewed: 04/16/2012 [facility] Patient Information 2014 [place].

## 2013-12-10 ENCOUNTER — Ambulatory Visit (INDEPENDENT_AMBULATORY_CARE_PROVIDER_SITE_OTHER): Payer: Medicare Other | Admitting: Internal Medicine

## 2013-12-10 ENCOUNTER — Encounter: Payer: Self-pay | Admitting: Internal Medicine

## 2013-12-10 VITALS — BP 128/76 | HR 76 | Temp 98.1°F | Resp 16 | Ht 70.75 in | Wt 210.0 lb

## 2013-12-10 DIAGNOSIS — N419 Inflammatory disease of prostate, unspecified: Secondary | ICD-10-CM

## 2013-12-10 MED ORDER — CIPROFLOXACIN HCL 500 MG PO TABS: 500.0000 mg | ORAL_TABLET | Freq: Two times a day (BID) | ORAL | Status: AC

## 2013-12-10 NOTE — Patient Instructions (Signed)
Prostatitis The prostate gland is about the size and shape of a walnut. It is located just below your bladder. It produces one of the components of semen, which is made up of sperm and the fluids that help nourish and transport it out from the testicles. Prostatitis is inflammation of the prostate gland.  There are four types of prostatitis:  Acute bacterial prostatitis This is the least common type of prostatitis. It starts quickly and usually is associated with a bladder infection, high fever, and shaking chills. It can occur at any age.  Chronic bacterial prostatitis This is a persistent bacterial infection in the prostate. It usually develops from repeated acute bacterial prostatitis or acute bacterial prostatitis that was not properly treated. It can occur in men of any age but is most common in middle-aged men whose prostate has begun to enlarge. The symptoms are not as severe as those in acute bacterial prostatitis. Discomfort in the part of your body that is in front of your rectum and below your scrotum (perineum), lower abdomen, or in the head of your penis (glans) may represent your primary discomfort.  Chronic prostatitis (nonbacterial) This is the most common type of prostatitis. It is inflammation of the prostate gland that is not caused by a bacterial infection. The cause is unknown and may be associated with a viral infection or autoimmune disorder.  Prostatodynia (pelvic floor disorder) This is associated with increased muscular tone in the pelvis surrounding the prostate. CAUSES The causes of bacterial prostatitis are bacterial infection. The causes of the other types of prostatitis are unknown.  SYMPTOMS  Symptoms can vary depending upon the type of prostatitis that exists. There can also be overlap in symptoms. Possible symptoms for each type of prostatitis are listed below. Acute Bacterial Prostatitis  Painful urination.  Fever or chills.  Muscle or joint pains.  Low  back pain.  Low abdominal pain.  Inability to empty bladder completely. Chronic Bacterial Prostatitis, Chronic Nonbacterial Prostatitis, and Prostatodynia  Sudden urge to urinate.  Frequent urination.  Difficulty starting urine stream.  Weak urine stream.  Discharge from the urethra.  Dribbling after urination.  Rectal pain.  Pain in the testicles, penis, or tip of the penis.  Pain in the perineum.  Problems with sexual function.  Painful ejaculation.  Bloody semen. DIAGNOSIS  In order to diagnose prostatitis, your health care provider will ask about your symptoms. One or more urine samples will be taken and tested (urinalysis). If the urinalysis result is negative for bacteria, your health care provider may use a finger to feel your prostate (digital rectal exam). This exam helps your health care provider determine if your prostate is swollen and tender. It will also produce a specimen of semen that can be analyzed. TREATMENT  Treatment for prostatitis depends on the cause. If a bacterial infection is the cause, it can be treated with antibiotic medicine. In cases of chronic bacterial prostatitis, the use of antibiotics for up to 1 month or 6 weeks may be necessary. Your health care provider may instruct you to take sitz baths to help relieve pain. A sitz bath is a bath of hot water in which your hips and buttocks are under water. This relaxes the pelvic floor muscles and often helps to relieve the pressure on your prostate. HOME CARE INSTRUCTIONS   Take all medicines as directed by your health care provider.  Take sitz baths as directed by your health care provider. SEEK MEDICAL CARE IF:   Your symptoms   get worse, not better.  You have a fever. SEEK IMMEDIATE MEDICAL CARE IF:   You have chills.  You feel nauseous or vomit.  You feel lightheaded or faint.  You are unable to urinate.  You have blood or blood clots in your urine. Document Released: 09/08/2000  Document Revised: 07/02/2013 Document Reviewed: 03/31/2013 [employer] Patient Information 2014 [employer].  

## 2013-12-10 NOTE — Progress Notes (Signed)
Subjective:    Patient ID: Curtis Price, male    DOB: 11/02/1945, 68 y.o.   MRN: [id number]  Dysuria  The current episode started in the past 7 days. The problem occurs intermittently. The problem has been waxing and waning. The quality of the pain is described as burning. The pain is mild. There has been no fever. He is sexually active. There is no history of pyelonephritis. Associated symptoms include frequency, hesitancy and urgency. Pertinent negatives include no chills, discharge, flank pain, hematuria, nausea, sweats or vomiting. Associated symptoms comments: C/o aching in the Rt scrotal/testicular area.Marland Kitchen Hx/o Prostatitis   Medication Sig  . aspirin (ASPIR-LOW) 81 MG EC tablet Take 81 mg by mouth daily.    .  (WELLBUTRIN XL) 300 MG 24 hr tablet Take 1 tablet (300 mg total) by mouth daily. For mood  .  (CELEBREX) 200 MG capsule Take 1 capsule (200 mg total) by mouth 2 (two) times daily.  . colchicine 0.6 MG tablet Take 1 tablet (0.6 mg total) by mouth daily.  . fish oil-omega-3 fatty acids 1000 MG capsule Take 1 capsule by mouth 2 (two) times daily.    Marland Kitchen HYDROcodone-acetaminophen (NORCO) 5-325  1/2 to 1 tablet every 3 to 4 hours as needed for cough or pain  . Magnesium 250 MG TABS Take 1 tablet by mouth 2 (two) times daily.    . Multiple Vitamins-Min (STRESS B COMPLEX/ZINC)  Take 1 tablet by mouth daily.    .  (PROTONIX) 40 MG tablet Take 1 tablet (40 mg total) by mouth daily. For acid reflux and indigestion  .  (FLOMAX) 0.4 MG CAPS capsule   .  (ANDROGEL) 20.25 MG/1.25GM (1.62%) GEL Place 4 Squirts onto the skin daily. Daily   - disp 6 bottles (90 day supply) with refills for 6 months  . traMADol  50 MG tablet Take 1 tablet (50 mg total) by mouth every 6 (six) hours as needed.  . valsartan-hydrochlorothiazide 320-25 MG per tablet Take 1 tablet by mouth daily.     Allergies  Allergen Reactions  . Atenolol   . Atorvastatin   . Darvocet [Propoxyphene N-Acetaminophen]   . Paroxetine    . Pregabalin   . Propoxyphene Hcl    Past Medical History  Diagnosis Date  . BPH (benign prostatic hypertrophy)   . GERD (gastroesophageal reflux disease)   . Fibromyalgia   . Hypogonadism male   . History of epididymitis   . Myofascial pain syndrome   . Nephrolithiasis   . Prostatitis     Recurrent  . Prolapsed internal hemorrhoids   . History of head injury without skull fracture 1980    Concussion  . Tendinitis 2000    Hx of left bicep  . Rheumatic fever     x3 at ages [age] and 65; No significant valvular abnormality on 5/11 echo  . Exertional dyspnea     ETT myoview 5/11: 8 min, no ischemic ECG changes, stopped due to fatigue/ SOB. EF 60%, no ischemia or infarction. Echo 5/11 EF 60%, no significant valvular abnormalities, mild RV dilation and dysfunction, no complete TR doppler jet so PA pressure hard to gauge. 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 R heart cath 6/11: mean RA 10 mmHg, PA 25/18, mean PCWP 13 mmHg, CI 2.1  . Hypertension   . Hyperlipidemia     Myalgias with Lipitor  . Vitamin D deficiency    Review of Systems  Constitutional: Positive for fatigue.  Negative for fever and chills.  HENT: Negative.   Eyes: Negative.   Respiratory: Negative.   Cardiovascular: Negative.   Gastrointestinal: Negative for nausea and vomiting.  Endocrine: Negative.   Genitourinary: Positive for dysuria, hesitancy, urgency, frequency and testicular pain. Negative for hematuria, flank pain, discharge, penile swelling, scrotal swelling and penile pain.  Musculoskeletal: Positive for arthralgias and myalgias. Negative for neck pain.  Skin: Negative for pallor and rash.   BP 128/76  Pulse 76  Temp(Src) 98.1 F (36.7 C) (Temporal)  Resp 16  Ht 5' 10.75" (1.797 m)  Wt 210 lb (95.255 kg)  BMI 29.50 kg/m2  Objective:   Physical Exam HEENT - Eac's patent. TM's Nl.EOM's full. PERRLA. NasoOroPharynx clear. Neck - supple. Nl Thyroid. No bruits nodes  JVD Chest - Clear equal BS Cor - Nl HS. RRR w/o sig MGR. PP 1(+) No edema. Abd - No palpable organomegaly, masses or tenderness. BS nl. GU - no hernia . Testes unremarkable. DRE finds prostate 2-3(+) boggy and sl tender. MS- FROM. w/o deformities. Muscle power tone and bulk Nl. Gait Nl. Neuro - No obvious Cr N abnormalities. Sensory, motor and Cerebellar functions appear Nl w/o focal abnormalities.  Assessment & Plan:   1. Prostatitis  - Urine Microscopic - Urine culture - ciprofloxacin (CIPRO) 500 MG tablet; Take 1 tablet (500 mg total) by mouth 2 (two) times daily.  Dispense: 60 tab

## 2013-12-11 LAB — URINALYSIS, MICROSCOPIC ONLY
Bacteria, UA: NONE SEEN
Casts: NONE SEEN
Crystals: NONE SEEN
Squamous Epithelial / LPF: NONE SEEN

## 2013-12-11 LAB — URINE CULTURE
Colony Count: NO GROWTH
Organism ID, Bacteria: NO GROWTH

## 2014-01-02 ENCOUNTER — Other Ambulatory Visit: Payer: Self-pay | Admitting: Internal Medicine

## 2014-01-26 ENCOUNTER — Ambulatory Visit (INDEPENDENT_AMBULATORY_CARE_PROVIDER_SITE_OTHER): Payer: Medicare Other | Admitting: Internal Medicine

## 2014-01-26 ENCOUNTER — Encounter: Payer: Self-pay | Admitting: Internal Medicine

## 2014-01-26 VITALS — BP 128/84 | HR 64 | Temp 98.1°F | Resp 18 | Ht 70.0 in | Wt 208.2 lb

## 2014-01-26 DIAGNOSIS — Z79899 Other long term (current) drug therapy: Secondary | ICD-10-CM

## 2014-01-26 DIAGNOSIS — E559 Vitamin D deficiency, unspecified: Secondary | ICD-10-CM

## 2014-01-26 DIAGNOSIS — E785 Hyperlipidemia, unspecified: Secondary | ICD-10-CM

## 2014-01-26 DIAGNOSIS — I10 Essential (primary) hypertension: Secondary | ICD-10-CM

## 2014-01-26 DIAGNOSIS — R7309 Other abnormal glucose: Secondary | ICD-10-CM

## 2014-01-26 LAB — CBC WITH DIFFERENTIAL/PLATELET
Basophils Absolute: 0 10*3/uL (ref 0.0–0.1)
Basophils Relative: 0 % (ref 0–1)
Eosinophils Absolute: 0.4 10*3/uL (ref 0.0–0.7)
Eosinophils Relative: 5 % (ref 0–5)
HCT: 50.2 % (ref 39.0–52.0)
Hemoglobin: 18.4 g/dL — ABNORMAL HIGH (ref 13.0–17.0)
Lymphocytes Relative: 19 % (ref 12–46)
Lymphs Abs: 1.4 10*3/uL (ref 0.7–4.0)
MCH: 33.3 pg (ref 26.0–34.0)
MCHC: 36.7 g/dL — ABNORMAL HIGH (ref 30.0–36.0)
MCV: 90.9 fL (ref 78.0–100.0)
Monocytes Absolute: 0.7 10*3/uL (ref 0.1–1.0)
Monocytes Relative: 10 % (ref 3–12)
Neutro Abs: 4.9 10*3/uL (ref 1.7–7.7)
Neutrophils Relative %: 66 % (ref 43–77)
Platelets: 170 10*3/uL (ref 150–400)
RBC: 5.52 MIL/uL (ref 4.22–5.81)
RDW: 14 % (ref 11.5–15.5)
WBC: 7.4 10*3/uL (ref 4.0–10.5)

## 2014-01-26 LAB — HEMOGLOBIN A1C
Hgb A1c MFr Bld: 5.3 % (ref <5.7)
Mean Plasma Glucose: 105 mg/dL (ref <117)

## 2014-01-26 MED ORDER — VALSARTAN-HYDROCHLOROTHIAZIDE 320-25 MG PO TABS: ORAL_TABLET | ORAL | Status: DC

## 2014-01-26 NOTE — Progress Notes (Signed)
Patient ID: Curtis Price, male   DOB: [birth year], 68 y.o.   MRN: [id number]    This very nice 68 y.o. MWM presents for 3 month follow up with Hypertension, Hyperlipidemia, Fibromyalgia, screening for Pre-Diabetes and Vitamin D Deficiency.    HTN predates since 56. BP has been controlled at home. Today's BP: 128/84 mmHg . Patient denies any cardiac type chest pain, palpitations, dyspnea/orthopnea/PND, dizziness, claudication, or dependent edema.   Hyperlipidemia is not controlled with diet as patient is Statin Intolerant. Last Lipid Panel was as below. Patient denies myalgias or other med SE's.  Lab Results  Component Value Date   CHOL 216* 10/23/2013   HDL 46 10/23/2013   LDLCALC 137* 10/23/2013   LDLDIRECT 151.0 02/10/2010   TRIG 166* 10/23/2013   CHOLHDL 4.7 10/23/2013    Also, the patient is screened for PreDiabetes and  last A1c of  5.4%% in Oct 2014. Patient denies any symptoms of reactive hypoglycemia, diabetic polys, paresthesias or visual blurring.   Further, Patient has history of Vitamin D Deficiency with last vitamin D of   . Patient supplements vitamin D without any suspected side-effects.  Current Outpatient Prescriptions on File Prior to Visit  Medication Sig Dispense Refill  . aspirin (ASPIR-LOW) 81 MG EC tablet Take 81 mg by mouth daily.        Marland Kitchen buPROPion (WELLBUTRIN XL) 300 MG 24 hr tablet Take 1 tablet (300 mg total) by mouth daily. For mood  90 tablet  99  . celecoxib (CELEBREX) 200 MG capsule Take 1 capsule (200 mg total) by mouth 2 (two) times daily.  180 capsule  1  . colchicine 0.6 MG tablet Take 1 tablet (0.6 mg total) by mouth daily.  90 tablet  99  . fish oil-omega-3 fatty acids 1000 MG capsule Take 1 capsule by mouth 2 (two) times daily.        . Magnesium 250 MG TABS Take 1 tablet by mouth 2 (two) times daily.        . Multiple Vitamins-Minerals (STRESS B COMPLEX/ZINC) TABS Take 1 tablet by mouth daily.        . pantoprazole (PROTONIX) 40 MG tablet Take 1 tablet  (40 mg total) by mouth daily. For acid reflux and indigestion  90 tablet  99  . tamsulosin (FLOMAX) 0.4 MG CAPS capsule       . Testosterone (ANDROGEL) 20.25 MG/1.25GM (1.62%) GEL Place 4 Squirts onto the skin daily. Daily   - disp 6 bottles (90 day supply) with refills for 6 months  6 g  3   No current facility-administered medications on file prior to visit.     Allergies  Allergen Reactions  . Atenolol   . Atorvastatin   . Darvocet [Propoxyphene N-Acetaminophen]   . Paroxetine   . Pregabalin   . Propoxyphene Hcl    PMHx:   Past Medical History  Diagnosis Date  . BPH (benign prostatic hypertrophy)   . GERD (gastroesophageal reflux disease)   . Fibromyalgia   . Hypogonadism male   . History of epididymitis   . Myofascial pain syndrome   . Nephrolithiasis   . Prostatitis     Recurrent  . Prolapsed internal hemorrhoids   . History of head injury without skull fracture 1980    Concussion  . Tendinitis 2000    Hx of left bicep  . Rheumatic fever     x3 at ages [age] and 73; No significant valvular abnormality on 5/11 echo  . Exertional dyspnea  ETT myoview 5/11: 8 min, no ischemic ECG changes, stopped due to fatigue/ SOB. EF 60%, no ischemia or infarction. Echo 5/11 EF 60%, no significant valvular abnormalities, mild RV dilation and dysfunction, no complete TR doppler jet so PA pressure hard to gauge. 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 R heart cath 6/11: mean RA 10 mmHg, PA 25/18, mean PCWP 13 mmHg, CI 2.1  . Hypertension   . Hyperlipidemia     Myalgias with Lipitor  . Vitamin D deficiency    FHx:    Reviewed / unchanged  SHx:    Reviewed / unchanged   Systems Review: Constitutional: Denies fever, chills, wt changes, headaches, insomnia, fatigue, night sweats, change in appetite. Eyes: Denies redness, blurred vision, diplopia, discharge, itchy, watery eyes.  ENT: Denies discharge, congestion, post nasal drip, epistaxis, sore throat,  earache, hearing loss, dental pain, tinnitus, vertigo, sinus pain, snoring.  CV: Denies chest pain, palpitations, irregular heartbeat, syncope, dyspnea, diaphoresis, orthopnea, PND, claudication, edema. Respiratory: denies cough, dyspnea, DOE, pleurisy, hoarseness, laryngitis, wheezing.  Gastrointestinal: Denies dysphagia, odynophagia, heartburn, reflux, water brash, abdominal pain or cramps, nausea, vomiting, bloating, diarrhea, constipation, hematemesis, melena, hematochezia,  or hemorrhoids. Genitourinary: Denies dysuria, frequency, urgency, nocturia, hesitancy, discharge, hematuria, flank pain. Musculoskeletal: Denies arthralgias, myalgias, stiffness, jt. swelling, pain, limp, strain/sprain.  Skin: Denies pruritus, rash, hives, warts, acne, eczema, change in skin lesion(s). Neuro: No weakness, tremor, incoordination, spasms, paresthesia, or pain. Psychiatric: Denies confusion, memory loss, or sensory loss. Endo: Denies change in weight, skin, hair change.  Heme/Lymph: No excessive bleeding, bruising, orenlarged lymph nodes.  Exam:  BP 128/84  Pulse 64  Temp(Src) 98.1 F (36.7 C) (Temporal)  Resp 18  Ht 5\' 10"  (1.778 m)  Wt 208 lb 3.2 oz (94.439 kg)  BMI 29.87 kg/m2  Appears well nourished - in no distress. Eyes: PERRLA, EOMs, conjunctiva no swelling or erythema. Sinuses: No frontal/maxillary tenderness ENT/Mouth: EAC's clear, TM's nl w/o erythema, bulging. Nares clear w/o erythema, swelling, exudates. Oropharynx clear without erythema or exudates. Oral hygiene is good. Tongue normal, non obstructing. Hearing intact.  Neck: Supple. Thyroid nl. Car 2+/2+ without bruits, nodes or JVD. Chest: Respirations nl with BS clear & equal w/o rales, rhonchi, wheezing or stridor.  Cor: Heart sounds normal w/ regular rate and rhythm without sig. murmurs, gallops, clicks, or rubs. Peripheral pulses normal and equal  without edema.  Abdomen: Soft & bowel sounds normal. Non-tender w/o guarding,  rebound, hernias, masses, or organomegaly.  Lymphatics: Unremarkable.  Musculoskeletal: Full ROM all peripheral extremities, joint stability, 5/5 strength, and normal gait.  Skin: Warm, dry without exposed rashes, lesions, ecchymosis apparent.  Neuro: Cranial nerves intact, reflexes equal bilaterally. Sensory-motor testing grossly intact. Tendon reflexes grossly intact.  Pysch: Alert & oriented x 3. Insight and judgement nl & appropriate. No ideations.  Assessment and Plan:  1. Hypertension - Continue monitor blood pressure at home. Continue diet/meds same.  2. Hyperlipidemia - Poorly controlled on diet and recommended stricter exercise & lifestyle modifications. Continue monitor periodic cholesterol/liver & renal functions   3. Pre-diabetes - screening - Continue diet, exercise, lifestyle modifications. Monitor appropriate labs.  4. Vitamin D Deficiency - Continue supplementation.  5. Fibromyalgia  Recommended regular exercise, BP monitoring, weight control, and discussed med and SE's. Recommended labs to assess and monitor clinical status. Further disposition pending results of labs.

## 2014-01-26 NOTE — Patient Instructions (Signed)
Fibromyalgia Fibromyalgia is a disorder that is often misunderstood. It is associated with muscular pains and tenderness that comes and goes. It is often associated with fatigue and sleep disturbances. Though it tends to be long-lasting, fibromyalgia is not life-threatening. CAUSES  The exact cause of fibromyalgia is unknown. People with certain gene types are predisposed to developing fibromyalgia and other conditions. Certain factors can play a role as triggers, such as:  Spine disorders.  Arthritis.  Severe injury (trauma) and other physical stressors.  Emotional stressors. SYMPTOMS   The main symptom is pain and stiffness in the muscles and joints, which can vary over time.  Sleep and fatigue problems. Other related symptoms may include:  Bowel and bladder problems.  Headaches.  Visual problems.  Problems with odors and noises.  Depression or mood changes.  Painful periods (dysmenorrhea).  Dryness of the skin or eyes. DIAGNOSIS  There are no specific tests for diagnosing fibromyalgia. Patients can be diagnosed accurately from the specific symptoms they have. The diagnosis is made by determining that nothing else is causing the problems. TREATMENT  There is no cure. Management includes medicines and an active, healthy lifestyle. The goal is to enhance physical fitness, decrease pain, and improve sleep. HOME CARE INSTRUCTIONS   Only take over-the-counter or prescription medicines as directed by your caregiver. Sleeping pills, tranquilizers, and pain medicines may make your problems worse.  Low-impact aerobic exercise is very important and advised for treatment. At first, it may seem to make pain worse. Gradually increasing your tolerance will overcome this feeling.  Learning relaxation techniques and how to control stress will help you. Biofeedback, visual imagery, hypnosis, muscle relaxation, yoga, and meditation are all options.  Anti-inflammatory medicines and  physical therapy may provide short-term help.  Acupuncture or massage treatments may help.  Take muscle relaxant medicines as suggested by your caregiver.  Avoid stressful situations.  Plan a healthy lifestyle. This includes your diet, sleep, rest, exercise, and friends.  Find and practice a hobby you enjoy.  Join a fibromyalgia support group for interaction, ideas, and sharing advice. This may be helpful. SEEK MEDICAL CARE IF:  You are not having good results or improvement from your treatment. FOR MORE INFORMATION  National Fibromyalgia Association: www.fmaware.[place]: www.arthritis.org Document Released: 09/11/2005 Document Revised: 12/04/2011 Document Reviewed: 12/22/2009 [facility] Patient Information 2014 [place], [place].   Hypertension As your heart beats, it forces blood through your arteries. This force is your blood pressure. If the pressure is too high, it is called hypertension (HTN) or high blood pressure. HTN is dangerous because you may have it and not know it. High blood pressure may mean that your heart has to work harder to pump blood. Your arteries may be narrow or stiff. The extra work puts you at risk for heart disease, stroke, and other problems.  Blood pressure consists of two numbers, a higher number over a lower, 110/72, for example. It is stated as "110 over 72." The ideal is below 120 for the top number (systolic) and under 80 for the bottom (diastolic). Write down your blood pressure today. You should pay close attention to your blood pressure if you have certain conditions such as:  Heart failure.  Prior heart attack.  Diabetes  Chronic kidney disease.  Prior stroke.  Multiple risk factors for heart disease. To see if you have HTN, your blood pressure should be measured while you are seated with your arm held at the level of the heart. It should be measured at least  twice. A one-time elevated blood pressure reading (especially in the  Emergency Department) does not mean that you need treatment. There may be conditions in which the blood pressure is different between your right and left arms. It is important to see your caregiver soon for a recheck. Most people have essential hypertension which means that there is not a specific cause. This type of high blood pressure may be lowered by changing lifestyle factors such as:  Stress.  Smoking.  Lack of exercise.  Excessive weight.  Drug/tobacco/alcohol use.  Eating less salt. Most people do not have symptoms from high blood pressure until it has caused damage to the body. Effective treatment can often prevent, delay or reduce that damage. TREATMENT  When a cause has been identified, treatment for high blood pressure is directed at the cause. There are a large number of medications to treat HTN. These fall into several categories, and your caregiver will help you select the medicines that are best for you. Medications may have side effects. You should review side effects with your caregiver. If your blood pressure stays high after you have made lifestyle changes or started on medicines,   Your medication(s) may need to be changed.  Other problems may need to be addressed.  Be certain you understand your prescriptions, and know how and when to take your medicine.  Be sure to follow up with your caregiver within the time frame advised (usually within two weeks) to have your blood pressure rechecked and to review your medications.  If you are taking more than one medicine to lower your blood pressure, make sure you know how and at what times they should be taken. Taking two medicines at the same time can result in blood pressure that is too low. SEEK IMMEDIATE MEDICAL CARE IF:  You develop a severe headache, blurred or changing vision, or confusion.  You have unusual weakness or numbness, or a faint feeling.  You have severe chest or abdominal pain, vomiting, or breathing  problems. MAKE SURE YOU:   Understand these instructions.  Will watch your condition.  Will get help right away if you are not doing well or get worse.   Diabetes and Exercise Exercising regularly is important. It is not just about losing weight. It has many health benefits, such as:  Improving your overall fitness, flexibility, and endurance.  Increasing your bone density.  Helping with weight control.  Decreasing your body fat.  Increasing your muscle strength.  Reducing stress and tension.  Improving your overall health. People with diabetes who exercise gain additional benefits because exercise:  Reduces appetite.  Improves the body's use of blood sugar (glucose).  Helps lower or control blood glucose.  Decreases blood pressure.  Helps control blood lipids (such as cholesterol and triglycerides).  Improves the body's use of the hormone insulin by:  Increasing the body's insulin sensitivity.  Reducing the body's insulin needs.  Decreases the risk for heart disease because exercising:  Lowers cholesterol and triglycerides levels.  Increases the levels of good cholesterol (such as high-density lipoproteins [HDL]) in the body.  Lowers blood glucose levels. YOUR ACTIVITY PLAN  Choose an activity that you enjoy and set realistic goals. Your health care provider or diabetes educator can help you make an activity plan that works for you. You can break activities into 2 or 3 sessions throughout the day. Doing so is as good as one long session. Exercise ideas include:  Taking the dog for a walk.  Taking the  stairs instead of the elevator.  Dancing to your favorite song.  Doing your favorite exercise with a friend. RECOMMENDATIONS FOR EXERCISING WITH TYPE 1 OR TYPE 2 DIABETES   Check your blood glucose before exercising. If blood glucose levels are greater than 240 mg/dL, check for urine ketones. Do not exercise if ketones are present.  Avoid injecting  insulin into areas of the body that are going to be exercised. For example, avoid injecting insulin into:  The arms when playing tennis.  The legs when jogging.  Keep a record of:  Food intake before and after you exercise.  Expected peak times of insulin action.  Blood glucose levels before and after you exercise.  The type and amount of exercise you have done.  Review your records with your health care provider. Your health care provider will help you to develop guidelines for adjusting food intake and insulin amounts before and after exercising.  If you take insulin or oral hypoglycemic agents, watch for signs and symptoms of hypoglycemia. They include:  Dizziness.  Shaking.  Sweating.  Chills.  Confusion.  Drink plenty of water while you exercise to prevent dehydration or heat stroke. Body water is lost during exercise and must be replaced.  Talk to your health care provider before starting an exercise program to make sure it is safe for you. Remember, almost any type of activity is better than none.    Cholesterol Cholesterol is a white, waxy, fat-like protein needed by your body in small amounts. The liver makes all the cholesterol you need. It is carried from the liver by the blood through the blood vessels. Deposits (plaque) may build up on blood vessel walls. This makes the arteries narrower and stiffer. Plaque increases the risk for heart attack and stroke. You cannot feel your cholesterol level even if it is very high. The only way to know is by a blood test to check your lipid (fats) levels. Once you know your cholesterol levels, you should keep a record of the test results. Work with your caregiver to to keep your levels in the desired range. WHAT THE RESULTS MEAN:  Total cholesterol is a rough measure of all the cholesterol in your blood.  LDL is the so-called bad cholesterol. This is the type that deposits cholesterol in the walls of the arteries. You want  this level to be low.  HDL is the good cholesterol because it cleans the arteries and carries the LDL away. You want this level to be high.  Triglycerides are fat that the body can either burn for energy or store. High levels are closely linked to heart disease. DESIRED LEVELS:  Total cholesterol below 200.  LDL below 100 for people at risk, below 70 for very high risk.  HDL above 50 is good, above 60 is best.  Triglycerides below 150. HOW TO LOWER YOUR CHOLESTEROL:  Diet.  Choose fish or white meat chicken and [place], roasted or baked. Limit fatty cuts of red meat, fried foods, and processed meats, such as sausage and lunch meat.  Eat lots of fresh fruits and vegetables. Choose whole grains, beans, pasta, potatoes and cereals.  Use only small amounts of olive, corn or canola oils. Avoid butter, mayonnaise, shortening or palm kernel oils. Avoid foods with trans-fats.  Use skim/nonfat milk and low-fat/nonfat yogurt and cheeses. Avoid whole milk, cream, ice cream, egg yolks and cheeses. Healthy desserts include angel food cake, ginger snaps, animal crackers, hard candy, popsicles, and low-fat/nonfat frozen yogurt. Avoid pastries,  cakes, pies and cookies.  Exercise.  A regular program helps decrease LDL and raises HDL.  Helps with weight control.  Do things that increase your activity level like gardening, walking, or taking the stairs.  Medication.  May be prescribed by your caregiver to help lowering cholesterol and the risk for heart disease.  You may need medicine even if your levels are normal if you have several risk factors. HOME CARE INSTRUCTIONS   Follow your diet and exercise programs as suggested by your caregiver.  Take medications as directed.  Have blood work done when your caregiver feels it is necessary. MAKE SURE YOU:   Understand these instructions.  Will watch your condition.  Will get help right away if you are not doing well or get  worse.      Vitamin D Deficiency Vitamin D is an important vitamin that your body needs. Having too little of it in your body is called a deficiency. A very bad deficiency can make your bones soft and can cause a condition called rickets.  Vitamin D is important to your body for different reasons, such as:   It helps your body absorb 2 minerals called calcium and phosphorus.  It helps make your bones healthy.  It may prevent some diseases, such as diabetes and multiple sclerosis.  It helps your muscles and heart. You can get vitamin D in several ways. It is a natural part of some foods. The vitamin is also added to some dairy products and cereals. Some people take vitamin D supplements. Also, your body makes vitamin D when you are in the sun. It changes the sun's rays into a form of the vitamin that your body can use. CAUSES   Not eating enough foods that contain vitamin D.  Not getting enough sunlight.  Having certain digestive system diseases that make it hard to absorb vitamin D. These diseases include Crohn's disease, chronic pancreatitis, and cystic fibrosis.  Having a surgery in which part of the stomach or small intestine is removed.  Being obese. Fat cells pull vitamin D out of your blood. That means that obese people may not have enough vitamin D left in their blood and in other body tissues.  Having chronic kidney or liver disease. RISK FACTORS Risk factors are things that make you more likely to develop a vitamin D deficiency. They include:  Being older.  Not being able to get outside very much.  Living in a nursing home.  Having had broken bones.  Having weak or thin bones (osteoporosis).  Having a disease or condition that changes how your body absorbs vitamin D.  Having dark skin.  Some medicines such as seizure medicines or steroids.  Being overweight or obese. SYMPTOMS Mild cases of vitamin D deficiency may not have any symptoms. If you have a very  bad case, symptoms may include:  Bone pain.  Muscle pain.  Falling often.  Broken bones caused by a minor injury, due to osteoporosis. DIAGNOSIS A blood test is the best way to tell if you have a vitamin D deficiency. TREATMENT Vitamin D deficiency can be treated in different ways. Treatment for vitamin D deficiency depends on what is causing it. Options include:  Taking vitamin D supplements.  Taking a calcium supplement. Your caregiver will suggest what dose is best for you. HOME CARE INSTRUCTIONS  Take any supplements that your caregiver prescribes. Follow the directions carefully. Take only the suggested amount.  Have your blood tested 2 months after you  supplements.  Eat foods that contain vitamin D. Healthy choices include:  Fortified dairy products, cereals, or juices. Fortified means vitamin D has been added to the food. Check the label on the package to be sure.  Fatty fish like salmon or trout.  Eggs.  Oysters.  Do not use a tanning bed.  Keep your weight at a healthy level. Lose weight if you need to.  Keep all follow-up appointments. Your caregiver will need to perform blood tests to make sure your vitamin D deficiency is going away. SEEK MEDICAL CARE IF:  You have any questions about your treatment.  You continue to have symptoms of vitamin D deficiency.  You have nausea or vomiting.  You are constipated.  You feel confused.  You have severe abdominal or back pain. MAKE SURE YOU:  Understand these instructions.  Will watch your condition.  Will get help right away if you are not doing well or get worse.   

## 2014-01-27 LAB — TSH: TSH: 4.042 u[IU]/mL (ref 0.350–4.500)

## 2014-01-27 LAB — HEPATIC FUNCTION PANEL
ALT: 41 U/L (ref 0–53)
AST: 25 U/L (ref 0–37)
Albumin: 4.1 g/dL (ref 3.5–5.2)
Alkaline Phosphatase: 54 U/L (ref 39–117)
Bilirubin, Direct: 0.2 mg/dL (ref 0.0–0.3)
Indirect Bilirubin: 0.7 mg/dL (ref 0.2–1.2)
Total Bilirubin: 0.9 mg/dL (ref 0.2–1.2)
Total Protein: 6.2 g/dL (ref 6.0–8.3)

## 2014-01-27 LAB — BASIC METABOLIC PANEL WITH GFR
BUN: 22 mg/dL (ref 6–23)
CO2: 26 mEq/L (ref 19–32)
Calcium: 9.1 mg/dL (ref 8.4–10.5)
Chloride: 102 mEq/L (ref 96–112)
Creat: 1.22 mg/dL (ref 0.50–1.35)
GFR, Est African American: 71 mL/min
GFR, Est Non African American: 61 mL/min
Glucose, Bld: 90 mg/dL (ref 70–99)
Potassium: 3.9 mEq/L (ref 3.5–5.3)
Sodium: 139 mEq/L (ref 135–145)

## 2014-01-27 LAB — LIPID PANEL
Cholesterol: 194 mg/dL (ref 0–200)
HDL: 44 mg/dL (ref >39)
LDL Cholesterol: 110 mg/dL — ABNORMAL HIGH (ref 0–99)
Total CHOL/HDL Ratio: 4.4 Ratio
Triglycerides: 202 mg/dL — ABNORMAL HIGH (ref <150)
VLDL: 40 mg/dL (ref 0–40)

## 2014-01-27 LAB — MAGNESIUM: Magnesium: 1.8 mg/dL (ref 1.5–2.5)

## 2014-01-27 LAB — VITAMIN D 25 HYDROXY (VIT D DEFICIENCY, FRACTURES): Vit D, 25-Hydroxy: 64 ng/mL (ref 30–89)

## 2014-01-27 LAB — INSULIN, FASTING: Insulin fasting, serum: 19 u[IU]/mL (ref 3–28)

## 2014-02-13 ENCOUNTER — Encounter: Payer: Self-pay | Admitting: Internal Medicine

## 2014-02-13 ENCOUNTER — Ambulatory Visit (INDEPENDENT_AMBULATORY_CARE_PROVIDER_SITE_OTHER): Payer: Medicare Other | Admitting: Internal Medicine

## 2014-02-13 VITALS — BP 132/78 | HR 68 | Temp 97.9°F | Resp 16 | Ht 70.75 in | Wt 209.0 lb

## 2014-02-13 DIAGNOSIS — B009 Herpesviral infection, unspecified: Secondary | ICD-10-CM

## 2014-02-13 MED ORDER — ACYCLOVIR 800 MG PO TABS: ORAL_TABLET | ORAL | Status: DC

## 2014-02-13 NOTE — Progress Notes (Signed)
   Subjective:    Patient ID: Curtis Price, male    DOB: [birth year], 68 y.o.   MRN: [id number]  HPI  Pt c/o painful ulcerated skin area near top of Left buttock. Also relates remote Hx of Herpes lesion in the same area, but has not flered up for 15 years.  Meds, Allergies ,PMH, reviewed & unchanged.  Review of Systems Non - contributory  Objective:   Physical Exam  BP 132/78  Pulse 68  Temp(Src) 97.9 F (36.6 C) (Temporal)  Resp 16  Ht 5' 10.75" (1.797 m)  Wt 209 lb (94.802 kg)  BMI 29.36 kg/m2  Exam focused on skin show an irregular deep ulceration at the top of the lt buttock w/o signs of lymphangitis.  Assessment & Plan:   1. Herpes simplex infection of skin - acyclovir (ZOVIRAX) 800 MG tablet: Take 5 tabs qd x 10 days then - acyclovir (ZOVIRAX) 800 MG tablet; Take 1 capsule 2 x day for herpes Whitlow  Dispense: 180 tablet; Refill: 99 Until lesions heal. Then cut 1 tablet daily.  ROV prn

## 2014-02-13 NOTE — Patient Instructions (Signed)
Herpetic Whitlow  Herpetic whitlow is a painful infection of the hand. It can involve 1 or more fingers. It usually affects the end of the finger. This is caused by the Herpes simplex virus 1 (HSV-1) and herpes simplex virus 2 (HSV-2). It is an occupational risk among health care workers.   Herpetic whitlow is characterized by a starting infection, which may be followed by a problem free period but with future recurrences. After the initial infection, the virus enters nerve endings and lies dormant in those nerves. The primary infection usually is the most troublesome. Recurrences observed in 20-50% of cases are usually milder and shorter in duration. Once nerves are infected with herpes virus they are thought to contain that virus for the rest of your life.  CAUSES   Males and females are affected equally by herpetic whitlow. In health care workers, infection with HSV-1 is most common. It comes from exposure to infected secretions from the mouths of patients. Herpetic whitlow is started by exposure to infected body fluids. The virus gets in through a break in the skin. This could be any small thing such as a torn cuticle. The virus then invades the skin cells. Signs of infection show in days. In children, HSV-1 is the most likely cause. Infection involving the finger usually is due to finger-sucking or thumb-sucking in patients with herpes infection. Toddlers and preschool children are most likely to engage in thumb-sucking or finger-sucking behavior. They are susceptible to herpetic whitlow if they have herpes infection of the mouth.  SYMPTOMS   · Following exposure, problems usually develop within 2-20 days (incubation period). Sometimes fever and sleepiness are observed. Most often initial symptoms are pain and burning or tingling of the infected digit.  · This usually is followed by redness, swelling. There will be development of rice sized vesicles on a red base over the next 7-10 days.  · These vesicles may  ulcerate or break. They usually contain clear fluid. But the fluid may appear cloudy or bloody. Inflammation of the lymph channels which return the body fluids to the heart and lymphnodes (swollen glands) are common. After 10-14 days, symptoms usually improve. The sores (lesions) crust over and heal.  · The infectious phase is believed to be over at this point. Complete resolution happens over the next 5-7 days.  · Problems from this infection are usually related to secondary infections. Complications may include delayed resolution, bacterial overgrowth. These rarely spread throughout the body with serious consequences.  DIAGNOSIS   The diagnosis is usually easily made on physical exam. Sometimes lab work is needed.  HOME CARE INSTRUCTIONS   · Only take over-the-counter or prescription medicines for pain, discomfort, or fever as directed by your caregiver. Do notuse aspirin.  · Do not touch the blisters or pick the scabs. Wash your hands often. Do not touch your eyes, mouth or genital areas without washing your hands first. Do not share towels and wash cloths.  · Apply an ice pack to the sore area for discomfort.  · This infection is contagious. Avoid close contact with other people until blisters heal. This can be transferred to both the mouth and the genital area.  · Eat a well balanced diet.  · This problem can be prevented by use of gloves. Observe fluid precautions if you are handling people.  · In the general adult population, herpetic whitlow is most often from yourself. It is most frequently secondary to infection with HSV-2.  MAKE SURE YOU:   ·   Understand these instructions.  · Will watch your condition.  · Will get help right away if you are not doing well or get worse.  Document Released: 12/02/2002 Document Revised: 12/04/2011 Document Reviewed: 04/30/2008  [employer]® Patient Information ©2014 [employer].

## 2014-02-23 ENCOUNTER — Other Ambulatory Visit: Payer: Self-pay

## 2014-02-23 MED ORDER — MELOXICAM 15 MG PO TABS: 15.0000 mg | ORAL_TABLET | Freq: Every day | ORAL | Status: DC

## 2014-02-23 NOTE — Telephone Encounter (Signed)
Patients insurance denied the generic celebrex (celecoxib 200 mg) twice daily, letter to be mailed to patient per ins company, per Dr Melford Aase try Meloxicam 15 mg once daily as requested by insurance company, [facility]

## 2014-04-01 ENCOUNTER — Encounter: Payer: Self-pay | Admitting: Internal Medicine

## 2014-04-01 ENCOUNTER — Ambulatory Visit (INDEPENDENT_AMBULATORY_CARE_PROVIDER_SITE_OTHER): Payer: Medicare Other | Admitting: Internal Medicine

## 2014-04-01 ENCOUNTER — Other Ambulatory Visit: Payer: Self-pay | Admitting: Internal Medicine

## 2014-04-01 VITALS — BP 126/80 | HR 84 | Temp 97.9°F | Resp 16 | Ht 70.75 in | Wt 211.8 lb

## 2014-04-01 DIAGNOSIS — Z79899 Other long term (current) drug therapy: Secondary | ICD-10-CM

## 2014-04-01 DIAGNOSIS — M949 Disorder of cartilage, unspecified: Secondary | ICD-10-CM

## 2014-04-01 DIAGNOSIS — M2550 Pain in unspecified joint: Secondary | ICD-10-CM

## 2014-04-01 DIAGNOSIS — M898X9 Other specified disorders of bone, unspecified site: Secondary | ICD-10-CM

## 2014-04-01 DIAGNOSIS — M790 Rheumatism, unspecified: Secondary | ICD-10-CM

## 2014-04-01 DIAGNOSIS — M797 Fibromyalgia: Principal | ICD-10-CM

## 2014-04-01 DIAGNOSIS — M79603 Pain in arm, unspecified: Secondary | ICD-10-CM

## 2014-04-01 DIAGNOSIS — M899 Disorder of bone, unspecified: Secondary | ICD-10-CM

## 2014-04-01 LAB — CBC WITH DIFFERENTIAL/PLATELET
Basophils Absolute: 0 10*3/uL (ref 0.0–0.1)
Basophils Relative: 0 % (ref 0–1)
Eosinophils Absolute: 0.3 10*3/uL (ref 0.0–0.7)
Eosinophils Relative: 6 % — ABNORMAL HIGH (ref 0–5)
HCT: 48.9 % (ref 39.0–52.0)
Hemoglobin: 18.1 g/dL — ABNORMAL HIGH (ref 13.0–17.0)
Lymphocytes Relative: 23 % (ref 12–46)
Lymphs Abs: 1.2 10*3/uL (ref 0.7–4.0)
MCH: 34 pg (ref 26.0–34.0)
MCHC: 37 g/dL — ABNORMAL HIGH (ref 30.0–36.0)
MCV: 91.9 fL (ref 78.0–100.0)
Monocytes Absolute: 0.7 10*3/uL (ref 0.1–1.0)
Monocytes Relative: 14 % — ABNORMAL HIGH (ref 3–12)
Neutro Abs: 2.9 10*3/uL (ref 1.7–7.7)
Neutrophils Relative %: 57 % (ref 43–77)
Platelets: 150 10*3/uL (ref 150–400)
RBC: 5.32 MIL/uL (ref 4.22–5.81)
RDW: 13.8 % (ref 11.5–15.5)
WBC: 5.1 10*3/uL (ref 4.0–10.5)

## 2014-04-01 LAB — SEDIMENTATION RATE: Sed Rate: 1 mm/hr (ref 0–16)

## 2014-04-01 MED ORDER — BACLOFEN 10 MG PO TABS: ORAL_TABLET | ORAL | Status: AC

## 2014-04-01 MED ORDER — TRAMADOL HCL 50 MG PO TABS: ORAL_TABLET | ORAL | Status: DC

## 2014-04-01 MED ORDER — SILDENAFIL CITRATE 20 MG PO TABS: ORAL_TABLET | ORAL | Status: DC

## 2014-04-01 NOTE — Progress Notes (Signed)
Subjective:    Patient ID: Curtis Price, male    DOB: 1946/03/17, 68 y.o.   MRN: [id number]  Back Pain This is a recurrent problem. The current episode started more than 1 year ago. The problem occurs intermittently. The problem has been waxing and waning since onset. The pain is present in the lumbar spine and sacro-iliac. The quality of the pain is described as aching and cramping. The pain does not radiate. The pain is moderate. The pain is worse during the day. The symptoms are aggravated by bending, position, standing and twisting. Stiffness is present in the morning. Associated symptoms include headaches and weakness. Pertinent negatives include no fever or numbness. He has tried home exercises, ice and NSAIDs for the symptoms. The treatment provided mild relief.  Shoulder Pain  The pain is present in the left shoulder, neck, left arm, left upper leg, left lower leg, right shoulder, right arm, left hand, left fingers, right hand, right fingers, right upper leg and right lower leg (c/o pain in the upper limb girdle /shoulder musculature radiating to the UE's andb back and has lesser Sx's of the lower pelvic/thigh girdle musculature. He reports severe stiffness in the mornings improving gradually with stretching and activity.). This is a chronic problem. The current episode started more than 1 year ago. There has been no history of extremity trauma. The problem occurs constantly. The problem has been waxing and waning. The quality of the pain is described as aching (Saye he feels like bone pain as well as joint and muscle pains). The pain is at a severity of 6/10. The pain is moderate. Associated symptoms include stiffness. Pertinent negatives include no fever, joint locking, joint swelling or numbness. The symptoms are aggravated by lying down. He has tried NSAIDS for the symptoms. The treatment provided mild relief.   Current Outpatient Prescriptions on File Prior to Visit  Medication Sig  Dispense Refill  . acyclovir (ZOVIRAX) 800 MG tablet Take 1 capsule 2 x day for herpes [place]  180 tablet  99  . aspirin (ASPIR-LOW) 81 MG EC tablet Take 81 mg by mouth daily.        Marland Kitchen buPROPion (WELLBUTRIN XL) 300 MG 24 hr tablet Take 1 tablet (300 mg total) by mouth daily. For mood  90 tablet  99  . colchicine 0.6 MG tablet Take 1 tablet (0.6 mg total) by mouth daily.  90 tablet  99  . fish oil-omega-3 fatty acids 1000 MG capsule Take 1 capsule by mouth 2 (two) times daily.        . Magnesium 250 MG TABS Take 1 tablet by mouth 2 (two) times daily.        . meloxicam (MOBIC) 15 MG tablet Take 1 tablet (15 mg total) by mouth daily.  90 tablet  3  . Multiple Vitamins-Minerals (STRESS B COMPLEX/ZINC) TABS Take 1 tablet by mouth daily.        . pantoprazole (PROTONIX) 40 MG tablet Take 1 tablet (40 mg total) by mouth daily. For acid reflux and indigestion  90 tablet  99  . Testosterone (ANDROGEL) 20.25 MG/1.25GM (1.62%) GEL Place 4 Squirts onto the skin daily. Daily   - disp 6 bottles (90 day supply) with refills for 6 months  6 g  3  . valsartan-hydrochlorothiazide (DIOVAN HCT) 320-25 MG per tablet Take 1/2 to 1 tablet daily as directed for BP       No current facility-administered medications on file prior to visit.   Allergies  Allergen Reactions  . Atenolol   . Atorvastatin   . Darvocet [Propoxyphene N-Acetaminophen]   . Paroxetine   . Pregabalin   . Propoxyphene Hcl    Past Medical History  Diagnosis Date  . BPH (benign prostatic hypertrophy)   . GERD (gastroesophageal reflux disease)   . Fibromyalgia   . Hypogonadism male   . History of epididymitis   . Myofascial pain syndrome   . Nephrolithiasis   . Prostatitis     Recurrent  . Prolapsed internal hemorrhoids   . History of head injury without skull fracture 1980    Concussion  . Tendinitis 2000    Hx of left bicep  . Rheumatic fever     x3 at ages [age] and 69; No significant valvular abnormality on 5/11 echo  .  Exertional dyspnea     ETT myoview 5/11: 8 min, no ischemic ECG changes, stopped due to fatigue/ SOB. EF 60%, no ischemia or infarction. Echo 5/11 EF 60%, no significant valvular abnormalities, mild RV dilation and dysfunction, no complete TR doppler jet so PA pressure hard to gauge. 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 R heart cath 6/11: mean RA 10 mmHg, PA 25/18, mean PCWP 13 mmHg, CI 2.1  . Hypertension   . Hyperlipidemia     Myalgias with Lipitor  . Vitamin D deficiency    Review of Systems  Constitutional: Positive for activity change and fatigue. Negative for fever, diaphoresis and appetite change.  Eyes: Negative.   Respiratory: Negative.   Cardiovascular: Negative.   Gastrointestinal: Negative.   Musculoskeletal: Positive for arthralgias, back pain, myalgias, neck pain, neck stiffness and stiffness. Negative for joint swelling.  Skin: Negative.  Negative for pallor, rash and wound.  Neurological: Positive for dizziness, weakness, light-headedness and headaches. Negative for tremors, seizures, syncope, facial asymmetry, speech difficulty and numbness.  Psychiatric/Behavioral: Negative.     BP 126/80  P 84  T 97.9 F   R 16  Ht 5' 10.75"   Wt 211 lb 12.8 oz   BMI 29.75 kg/m2 Objective:   Physical Exam  Constitutional: He is oriented to person, place, and time.  Obese habitus  HENT:  Mouth/Throat: No oropharyngeal exudate.  Negative   Eyes: EOM are normal. Pupils are equal, round, and reactive to light.  Neck: Normal range of motion. No JVD present. No thyromegaly present.  Cardiovascular: Normal rate, regular rhythm and normal heart sounds.   No murmur heard. Pulmonary/Chest: Effort normal and breath sounds normal. He has no wheezes. He has no rales. He exhibits no tenderness.  Abdominal: Soft. Bowel sounds are normal. He exhibits no distension. There is no tenderness.  Musculoskeletal: Normal range of motion. He exhibits tenderness.   Lymphadenopathy:    He has no cervical adenopathy.  Neurological: He is alert and oriented to person, place, and time. No cranial nerve deficit. Coordination normal.  Skin: Skin is warm and dry. No rash noted. No erythema. No pallor.   Assessment & Plan:   1. Fibrositis/Fibromyalgia  2. Bone pain  3. Arthralgia of multiple sites, bilateral  - Sedimentation rate - Anti-DNA antibody, double-stranded - C3 and C4 - Cyclic citrul peptide antibody, IgG - ANA - Angiotensin converting enzyme - Rheumatoid factor - Lyme Aby, Wstrn. Blt. IgG & IgM w/bands - Protein electrophoresis, serum  4. Encounter for long-term (current) use of other medications  - CBC with Differential - BASIC METABOLIC PANEL WITH GFR - Hepatic function panel  5. Pain of upper  extremity, unspecified laterality  - CK   - Given Rx Baclofen 10 mg - 1/2-1 bid & Tramadol 50 mg bid & Sildenafil 20 mg - 1-5 tabs qd prn XXXX  - Discussed med effects and SE's.

## 2014-04-01 NOTE — Patient Instructions (Signed)
Fibromyalgia Fibromyalgia is a disorder that is often misunderstood. It is associated with muscular pains and tenderness that comes and goes. It is often associated with fatigue and sleep disturbances. Though it tends to be long-lasting, fibromyalgia is not life-threatening. CAUSES  The exact cause of fibromyalgia is unknown. People with certain gene types are predisposed to developing fibromyalgia and other conditions. Certain factors can play a role as triggers, such as:  Spine disorders.  Arthritis.  Severe injury (trauma) and other physical stressors.  Emotional stressors. SYMPTOMS   The main symptom is pain and stiffness in the muscles and joints, which can vary over time.  Sleep and fatigue problems. Other related symptoms may include:  Bowel and bladder problems.  Headaches.  Visual problems.  Problems with odors and noises.  Depression or mood changes.  Painful periods (dysmenorrhea).  Dryness of the skin or eyes. DIAGNOSIS  There are no specific tests for diagnosing fibromyalgia. Patients can be diagnosed accurately from the specific symptoms they have. The diagnosis is made by determining that nothing else is causing the problems. TREATMENT  There is no cure. Management includes medicines and an active, healthy lifestyle. The goal is to enhance physical fitness, decrease pain, and improve sleep. HOME CARE INSTRUCTIONS   Only take over-the-counter or prescription medicines as directed by your caregiver. Sleeping pills, tranquilizers, and pain medicines may make your problems worse.  Low-impact aerobic exercise is very important and advised for treatment. At first, it may seem to make pain worse. Gradually increasing your tolerance will overcome this feeling.  Learning relaxation techniques and how to control stress will help you. Biofeedback, visual imagery, hypnosis, muscle relaxation, yoga, and meditation are all options.  Anti-inflammatory medicines and  physical therapy may provide short-term help.  Acupuncture or massage treatments may help.  Take muscle relaxant medicines as suggested by your caregiver.  Avoid stressful situations.  Plan a healthy lifestyle. This includes your diet, sleep, rest, exercise, and friends.  Find and practice a hobby you enjoy.  Join a fibromyalgia support group for interaction, ideas, and sharing advice. This may be helpful. SEEK MEDICAL CARE IF:  You are not having good results or improvement from your treatment. FOR MORE INFORMATION  National Fibromyalgia Association: www.fmaware.org Arthritis Foundation: www.arthritis.org Document Released: 09/11/2005 Document Revised: 12/04/2011 Document Reviewed: 12/22/2009 ExitCare Patient Information 2015 [employer]. This information is not intended to replace advice given to you by your health care provider. Make sure you discuss any questions you have with your health care provider.  

## 2014-04-02 LAB — LYME ABY, WSTRN BLT IGG & IGM W/BANDS

## 2014-04-02 LAB — HEPATIC FUNCTION PANEL
ALT: 35 U/L (ref 0–53)
AST: 22 U/L (ref 0–37)
Albumin: 4.4 g/dL (ref 3.5–5.2)
Alkaline Phosphatase: 54 U/L (ref 39–117)
Bilirubin, Direct: 0.1 mg/dL (ref 0.0–0.3)
Indirect Bilirubin: 0.5 mg/dL (ref 0.2–1.2)
Total Bilirubin: 0.6 mg/dL (ref 0.2–1.2)
Total Protein: 6.3 g/dL (ref 6.0–8.3)

## 2014-04-02 LAB — BASIC METABOLIC PANEL WITH GFR
BUN: 19 mg/dL (ref 6–23)
CO2: 26 mEq/L (ref 19–32)
Calcium: 9.2 mg/dL (ref 8.4–10.5)
Chloride: 104 mEq/L (ref 96–112)
Creat: 1.07 mg/dL (ref 0.50–1.35)
GFR, Est African American: 83 mL/min
GFR, Est Non African American: 71 mL/min
Glucose, Bld: 90 mg/dL (ref 70–99)
Potassium: 4.2 mEq/L (ref 3.5–5.3)
Sodium: 140 mEq/L (ref 135–145)

## 2014-04-02 LAB — CYCLIC CITRUL PEPTIDE ANTIBODY, IGG: Cyclic Citrullin Peptide Ab: <2 U/mL (ref 0.0–5.0)

## 2014-04-02 LAB — CK: Total CK: 194 U/L (ref 7–232)

## 2014-04-02 LAB — C3 AND C4
C3 Complement: 107 mg/dL (ref 90–180)
C4 Complement: 25 mg/dL (ref 10–40)

## 2014-04-02 LAB — ANGIOTENSIN CONVERTING ENZYME: Angiotensin-Converting Enzyme: 47 U/L (ref 8–52)

## 2014-04-02 LAB — RHEUMATOID FACTOR: Rhuematoid fact SerPl-aCnc: <10 IU/mL (ref <=14)

## 2014-04-02 LAB — ANA: Anti Nuclear Antibody(ANA): NEGATIVE

## 2014-04-02 LAB — ANTI-DNA ANTIBODY, DOUBLE-STRANDED: ds DNA Ab: <1 IU/mL

## 2014-04-03 LAB — PROTEIN ELECTROPHORESIS, SERUM
Albumin ELP: 65.1 % (ref 55.8–66.1)
Alpha-1-Globulin: 4.4 % (ref 2.9–4.9)
Alpha-2-Globulin: 8.2 % (ref 7.1–11.8)
Beta 2: 4.1 % (ref 3.2–6.5)
Beta Globulin: 6.2 % (ref 4.7–7.2)
Gamma Globulin: 12 % (ref 11.1–18.8)
Total Protein, Serum Electrophoresis: 6.3 g/dL (ref 6.0–8.3)

## 2014-04-06 ENCOUNTER — Other Ambulatory Visit: Payer: Self-pay | Admitting: Internal Medicine

## 2014-04-06 ENCOUNTER — Other Ambulatory Visit: Payer: Medicare Other

## 2014-04-06 DIAGNOSIS — M898X9 Other specified disorders of bone, unspecified site: Secondary | ICD-10-CM

## 2014-04-07 ENCOUNTER — Telehealth: Payer: Self-pay | Admitting: *Deleted

## 2014-04-07 NOTE — Telephone Encounter (Signed)
Called pharmacy to inform Sildenafil RX will have to be cash pay per Dr Melford Aase.  Patient's spouse aware of cash pay.

## 2014-04-08 LAB — UIFE/LIGHT CHAINS/TP QN, 24-HR UR
Albumin, U: DETECTED
Alpha 1, Urine: DETECTED — AB
Alpha 2, Urine: DETECTED — AB
Beta, Urine: DETECTED — AB
Free Kappa Lt Chains,Ur: 1.64 mg/dL (ref 0.14–2.42)
Free Kappa/Lambda Ratio: 1.56 ratio — ABNORMAL LOW (ref 2.04–10.37)
Free Lambda Excretion/Day: 16.8 mg/d
Free Lambda Lt Chains,Ur: 1.05 mg/dL — ABNORMAL HIGH (ref 0.02–0.67)
Free Lt Chn Excr Rate: 26.24 mg/d
Gamma Globulin, Urine: DETECTED — AB
Total Protein, Urine-Ur/day: 51 mg/d (ref 10–140)
Total Protein, Urine: 3.2 mg/dL

## 2014-04-09 ENCOUNTER — Other Ambulatory Visit: Payer: Self-pay | Admitting: Internal Medicine

## 2014-04-09 DIAGNOSIS — D472 Monoclonal gammopathy: Secondary | ICD-10-CM

## 2014-04-16 ENCOUNTER — Telehealth: Payer: Self-pay | Admitting: Hematology and Oncology

## 2014-04-16 NOTE — Telephone Encounter (Signed)
S/W PATIENT WIFE AND GAVE NP APPT FOR 08/04 @ 11 W/DR. [place].  REFERRING DR. Gwyndolyn Saxon [place] DX- [facility] WELCOME PACKET MAILED.

## 2014-04-24 ENCOUNTER — Ambulatory Visit: Payer: 59

## 2014-04-24 ENCOUNTER — Ambulatory Visit: Payer: 59 | Admitting: Hematology and Oncology

## 2014-04-28 ENCOUNTER — Ambulatory Visit (HOSPITAL_BASED_OUTPATIENT_CLINIC_OR_DEPARTMENT_OTHER): Payer: Medicare Other

## 2014-04-28 ENCOUNTER — Telehealth: Payer: Self-pay | Admitting: Hematology and Oncology

## 2014-04-28 ENCOUNTER — Ambulatory Visit: Payer: Medicare Other

## 2014-04-28 ENCOUNTER — Encounter: Payer: Self-pay | Admitting: Hematology and Oncology

## 2014-04-28 ENCOUNTER — Ambulatory Visit (HOSPITAL_BASED_OUTPATIENT_CLINIC_OR_DEPARTMENT_OTHER): Payer: Medicare Other | Admitting: Hematology and Oncology

## 2014-04-28 VITALS — BP 163/91 | HR 63 | Temp 98.4°F | Resp 18 | Ht 70.0 in | Wt 208.9 lb

## 2014-04-28 VITALS — BP 149/82 | HR 76 | Resp 16

## 2014-04-28 DIAGNOSIS — R768 Other specified abnormal immunological findings in serum: Secondary | ICD-10-CM

## 2014-04-28 DIAGNOSIS — R799 Abnormal finding of blood chemistry, unspecified: Secondary | ICD-10-CM

## 2014-04-28 DIAGNOSIS — E291 Testicular hypofunction: Secondary | ICD-10-CM

## 2014-04-28 DIAGNOSIS — D751 Secondary polycythemia: Secondary | ICD-10-CM

## 2014-04-28 HISTORY — DX: Secondary polycythemia: D75.1

## 2014-04-28 HISTORY — DX: Other specified abnormal immunological findings in serum: R76.8

## 2014-04-28 LAB — CBC WITH DIFFERENTIAL/PLATELET
BASO%: 0.6 % (ref 0.0–2.0)
Basophils Absolute: 0 10*3/uL (ref 0.0–0.1)
EOS%: 3.2 % (ref 0.0–7.0)
Eosinophils Absolute: 0.2 10*3/uL (ref 0.0–0.5)
HCT: 55.9 % — ABNORMAL HIGH (ref 38.4–49.9)
HGB: 18.9 g/dL — ABNORMAL HIGH (ref 13.0–17.1)
LYMPH%: 16.3 % (ref 14.0–49.0)
MCH: 33.2 pg (ref 27.2–33.4)
MCHC: 33.8 g/dL (ref 32.0–36.0)
MCV: 98.1 fL — ABNORMAL HIGH (ref 79.3–98.0)
MONO#: 0.8 10*3/uL (ref 0.1–0.9)
MONO%: 11.7 % (ref 0.0–14.0)
NEUT#: 4.4 10*3/uL (ref 1.5–6.5)
NEUT%: 68.2 % (ref 39.0–75.0)
Platelets: 155 10*3/uL (ref 140–400)
RBC: 5.7 10*6/uL (ref 4.20–5.82)
RDW: 13.6 % (ref 11.0–14.6)
WBC: 6.5 10*3/uL (ref 4.0–10.3)
lymph#: 1.1 10*3/uL (ref 0.9–3.3)

## 2014-04-28 LAB — COMPREHENSIVE METABOLIC PANEL (CC13)
ALT: 44 U/L (ref 0–55)
AST: 27 U/L (ref 5–34)
Albumin: 3.8 g/dL (ref 3.5–5.0)
Alkaline Phosphatase: 59 U/L (ref 40–150)
Anion Gap: 8 mEq/L (ref 3–11)
BUN: 17.5 mg/dL (ref 7.0–26.0)
CO2: 29 mEq/L (ref 22–29)
Calcium: 9.1 mg/dL (ref 8.4–10.4)
Chloride: 102 mEq/L (ref 98–109)
Creatinine: 1.2 mg/dL (ref 0.7–1.3)
Glucose: 104 mg/dl (ref 70–140)
Potassium: 4.2 mEq/L (ref 3.5–5.1)
Sodium: 140 mEq/L (ref 136–145)
Total Bilirubin: 0.98 mg/dL (ref 0.20–1.20)
Total Protein: 6.7 g/dL (ref 6.4–8.3)

## 2014-04-28 MED ORDER — SODIUM CHLORIDE 0.9 % IV SOLN
Freq: Once | INTRAVENOUS | Status: AC
  Administered 2014-04-28: 13:00:00 via INTRAVENOUS

## 2014-04-28 NOTE — Assessment & Plan Note (Signed)
He had chronic testosterone replacement therapy for many years. Recommend he discuss with his primary care provider about medication taper due to profound erythrocytosis.

## 2014-04-28 NOTE — Patient Instructions (Signed)
Therapeutic Phlebotomy Therapeutic phlebotomy is the controlled removal of blood from your body for the purpose of treating a medical condition. It is similar to donating blood. Usually, about a pint (470 mL) of blood is removed. The average adult has 9 to 12 pints (4.3 to 5.7 L) of blood. Therapeutic phlebotomy may be used to treat the following medical conditions:  Hemochromatosis. This is a condition in which there is too much iron in the blood.  Polycythemia vera. This is a condition in which there are too many red cells in the blood.  Porphyria cutanea tarda. This is a disease usually passed from one generation to the next (inherited). It is a condition in which an important part of hemoglobin is not made properly. This results in the build up of abnormal amounts of porphyrins in the body.  Sickle cell disease. This is an inherited disease. It is a condition in which the red blood cells form an abnormal crescent shape rather than a round shape. LET YOUR CAREGIVER KNOW ABOUT:  Allergies.  Medicines taken including herbs, eyedrops, over-the-counter medicines, and creams.  Use of steroids (by mouth or creams).  Previous problems with anesthetics or numbing medicine.  History of blood clots.  History of bleeding or blood problems.  Previous surgery.  Possibility of pregnancy, if this applies. RISKS AND COMPLICATIONS This is a simple and safe procedure. Problems are unlikely. However, problems can occur and may include:  Nausea or lightheadedness.  Low blood pressure.  Soreness, bleeding, swelling, or bruising at the needle insertion site.  Infection. BEFORE THE PROCEDURE  This is a procedure that can be done as an outpatient. Confirm the time that you need to arrive for your procedure. Confirm whether there is a need to fast or withhold any medications. It is helpful to wear clothing with sleeves that can be raised above the elbow. A blood sample may be done to determine the  amount of red blood cells or iron in your blood. Plan ahead of time to have someone drive you home after the procedure. PROCEDURE The entire procedure from preparation through recovery takes about 1 hour. The actual collection takes about 10 to 15 minutes.  A needle will be inserted into your vein.  Tubing and a collection bag will be attached to that needle.  Blood will flow through the needle and tubing into the collection bag.  You may be asked to open and close your hand slowly and continuously during the entire collection.  Once the specified amount of blood has been removed from your body, the collection bag and tubing will be clamped.  The needle will be removed.  Pressure will be held on the site of the needle insertion to stop the bleeding. Then a bandage will be placed over the needle insertion site. AFTER THE PROCEDURE  Your recovery will be assessed and monitored. If there are no problems, as an outpatient, you should be able to go home shortly after the procedure.  Document Released: 02/13/2011 Document Revised: 12/04/2011 Document Reviewed: 02/13/2011 ExitCare Patient Information 2015 [employer]. This information is not intended to replace advice given to you by your health care provider. Make sure you discuss any questions you have with your health care provider.  Therapeutic Phlebotomy, Care After Refer to this sheet in the next few weeks. These instructions provide you with information on caring for yourself after your procedure. Your caregiver may also give you more specific instructions. Your treatment has been planned according to current medical   practices, but problems sometimes occur. Call your caregiver if you have any problems or questions after your procedure. HOME CARE INSTRUCTIONS Most people can go back to their normal activities right away. Before you leave, be sure to ask if there is anything you should or should not do. In general, it would be wise  to:  Keep the bandage dry. You can remove the bandage after about 5 hours.  Eat well-balanced meals for the next 24 hours.  Drink enough fluids to keep your urine clear or pale yellow.  Avoid drinking alcohol minimally until after eating.  Avoid smoking for at least 30 minutes after the procedure.  Avoid strenuous physical activity or heavy lifting or pulling for about 5 hours after the procedure.  Athletes should avoid strenuous exercise for 12 hours or more.  Change positions slowly for the remainder of the day to prevent light-headedness or fainting.  If you feel light-headed, lie down until the feeling subsides.  If you have bleeding from the needle insertion site, elevate your arm and press firmly on the site until the bleeding stops.  If bruising or bleeding appears under the skin, apply ice to the area for 15 to 20 minutes, 3 to 4 times per day. Put the ice in a plastic bag and place a towel between the bag of ice and your skin. Do this while you are awake for the first 24 hours. The ice packs can be stopped before 24 hours if the swelling goes away. If swelling persists after 24 hours, a warm, moist washcloth can be applied to the area for 15 to 20 minutes, 3 to 4 times per day. The warm, moist treatments can be stopped when the swelling goes away.  It is important to continue further therapeutic phlebotomy as directed by your caregiver. SEEK MEDICAL CARE IF:  There is bleeding or fluid leaking from the needle insertion site.  The needle insertion site becomes swollen, red, or sore.  You feel light-headed, dizzy or nauseated, and the feeling does not go away.  You notice new bruising at the needle insertion site.  You feel more weak or tired than normal.  You develop a fever. SEEK IMMEDIATE MEDICAL CARE IF:   There is increased bleeding, pain, or swelling from the needle insertion site.  You have severe nausea or vomiting.  You have chest pain.  You have trouble  breathing. MAKE SURE YOU:  Understand these instructions.  Will watch your condition.  Will get help right away if you are not doing well or get worse. Document Released: 02/13/2011 Document Revised: 01/26/2014 Document Reviewed: 02/13/2011 ExitCare Patient Information 2015 [employer]. This information is not intended to replace advice given to you by your health care provider. Make sure you discuss any questions you have with your health care provider.   

## 2014-04-28 NOTE — Assessment & Plan Note (Signed)
I am concerned about significant erythrocytosis. This is likely due to chronic testosterone replacement therapy. His profound muscle aches and fatigue could be signs of poor circulation. I recommend phlebotomy. During his phlebotomy, we encountered near syncopal episode. After 1 L of IV fluid resuscitation, his symptoms improved. I recommend he start donating blood regularly to help reduce risk of recurrent erythrocytosis and reduce the use of testosterone replacement therapy if possible. The patient is recommended to take aspirin.

## 2014-04-28 NOTE — Telephone Encounter (Signed)
gv and pritned avs for pt...sent pt to lab....charge nurse to add phl

## 2014-04-28 NOTE — Progress Notes (Signed)
Checked in new pt with no financial concerns. °

## 2014-04-28 NOTE — Progress Notes (Signed)
[id number] patient phlebotomized 1 unit without difficulty. After 7 minutes at 402 g, patient began to feel lightheaded and "woozie." pt became pale and diaphoretic. Patient reclined, legs elevated, VS assessed, MD called.  [place] Dr. Alvy Bimler arrived, ordered IV NS bolus wide open, then to receive remainder at 500 cc/hr for 2 hours.  1308 IV started without difficulty, NS infusing, patient remains calm and oriented. VSS. RN's to monitor.  1420 Patient continues to improve, IVF infusing, patient arousable and oriented; symptoms improving 1428 Patient improving, still reports slight lightheadedness, refusing food but drinking OJ. Will continue to monitor. VSS. 70 Dr. Alvy Bimler at chairside at this time to assess patient prior to discharge. Patient states he can drive home at this time.

## 2014-04-28 NOTE — Progress Notes (Signed)
[facility] CONSULT NOTE  Patient Care Team: [name], MD as PCP - General (Internal Medicine)  CHIEF COMPLAINTS/PURPOSE OF CONSULTATION:  Severe muscular aches and bone pain, elevated urine light chain excretion without signs of hypercalcemia, renal failure, anemia or abnormal bone fracture.  HISTORY OF PRESENTING ILLNESS:  Curtis Price 68 y.o. male is here because of complaints of bone pain and abnormal UPEP. He denies history of abnormal bone pain or bone fracture. Patient denies history of recurrent infection or atypical infections such as shingles of meningitis. Denies chills, night sweats, anorexia or abnormal weight loss. He has chronic fatigue and severe muscular pain. He denies prior history of DVT. 4 years ago, he had CT scan performed for shortness of breath which showed incidental coronary calcifications. MEDICAL HISTORY:  Past Medical History  Diagnosis Date  . BPH (benign prostatic hypertrophy)   . GERD (gastroesophageal reflux disease)   . Fibromyalgia   . Hypogonadism male   . History of epididymitis   . Myofascial pain syndrome   . Nephrolithiasis   . Prostatitis     Recurrent  . Prolapsed internal hemorrhoids   . History of head injury without skull fracture 1980    Concussion  . Tendinitis 2000    Hx of left bicep  . Rheumatic fever     x3 at ages [age] and 47; No significant valvular abnormality on 5/11 echo  . Exertional dyspnea     ETT myoview 5/11: 8 min, no ischemic ECG changes, stopped due to fatigue/ SOB. EF 60%, no ischemia or infarction. Echo 5/11 EF 60%, no significant valvular abnormalities, mild RV dilation and dysfunction, no complete TR doppler jet so PA pressure hard to gauge. 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 R heart cath 6/11: mean RA 10 mmHg, PA 25/18, mean PCWP 13 mmHg, CI 2.1  . Hypertension   . Hyperlipidemia     Myalgias with Lipitor  . Vitamin D deficiency   . Elevated serum  immunoglobulin free light chains 04/28/2014  . Erythrocytosis 04/28/2014    SURGICAL HISTORY: Past Surgical History  Procedure Laterality Date  . Cardiac catheterization  02/2010    Left and right  . Heel spur surgery  1960    Bilateral  . Tonsillectomy  1962  . Finger surgery  2000    Repair of injury to right fifth finger  . Bih repair  08/2005    Incarverated fat by mesh repair    SOCIAL HISTORY: History   Social History  . Marital Status: Married    Spouse Name: N/A    Number of Children: N/A  . Years of Education: N/A   Occupational History  . 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 Smoking status: Never Smoker   . Smokeless tobacco: Never Used     Comment: Rare cigars  . Alcohol Use: Yes     Comment: Occasional social drinker;cocktail, beer, or mixed drink daily  . Drug Use: Not on file  . Sexual Activity: Not on file   Other Topics Concern  . Not on file   Social History Narrative   Married, lives in [place]: Family History  Problem Relation Age of Onset  . Hypertension Mother   . Breast cancer Mother   . Aortic stenosis Father   . Stroke Neg Hx   .  Hypertension Other   . Aortic stenosis Other   . Breast cancer Other   . Diabetes Other   . Other Other     ASHD and epilepsy    ALLERGIES:  is allergic to atenolol; atorvastatin; darvocet; paroxetine; pregabalin; and propoxyphene hcl.  MEDICATIONS:  Current Outpatient Prescriptions  Medication Sig Dispense Refill  . aspirin (ASPIR-LOW) 81 MG EC tablet Take 81 mg by mouth daily.        . baclofen (LIORESAL) 10 MG tablet 1/2 to 1 tablet 2 x day for muscle aches  60 tablet  1  . buPROPion (WELLBUTRIN XL) 300 MG 24 hr tablet Take 1 tablet (300 mg total) by mouth daily. For mood  90 tablet  99  . colchicine 0.6 MG tablet Take 1 tablet (0.6 mg total) by mouth daily.  90 tablet  99  . fish oil-omega-3  fatty acids 1000 MG capsule Take 1 capsule by mouth 2 (two) times daily.        . Magnesium 250 MG TABS Take 1 tablet by mouth 2 (two) times daily.        . Multiple Vitamins-Minerals (STRESS B COMPLEX/ZINC) TABS Take 1 tablet by mouth daily.        . pantoprazole (PROTONIX) 40 MG tablet Take 1 tablet (40 mg total) by mouth daily. For acid reflux and indigestion  90 tablet  99  . sildenafil (REVATIO) 20 MG tablet Take 1 to 5 pills once daily as needed for XXXX  50 tablet  99  . Testosterone (ANDROGEL) 20.25 MG/1.25GM (1.62%) GEL Place 4 Squirts onto the skin daily. Daily   - disp 6 bottles (90 day supply) with refills for 6 months  6 g  3  . traMADol (ULTRAM) 50 MG tablet 1 tablet 2 x day for pain  60 tablet  1  . valsartan-hydrochlorothiazide (DIOVAN-HCT) 320-25 MG per tablet Take 1/2  daily as directed for BP       No current facility-administered medications for this visit.    REVIEW OF SYSTEMS:   Eyes: Denies blurriness of vision, double vision or watery eyes Ears, nose, mouth, throat, and face: Denies mucositis or sore throat Respiratory: Denies cough, dyspnea or wheezes Cardiovascular: Denies palpitation, chest discomfort or lower extremity swelling Gastrointestinal:  Denies nausea, heartburn or change in bowel habits Skin: Denies abnormal skin rashes Lymphatics: Denies new lymphadenopathy or easy bruising Neurological:Denies numbness, tingling or new weaknesses Behavioral/Psych: Mood is stable, no new changes  All other systems were reviewed with the patient and are negative.  PHYSICAL EXAMINATION: ECOG PERFORMANCE STATUS: 1 - Symptomatic but completely ambulatory  Filed Vitals:   04/28/14 1133  BP: 163/91  Pulse: 63  Temp: 98.4 F (36.9 C)  Resp: 18   Filed Weights   04/28/14 1133  Weight: 208 lb 14.4 oz (94.756 kg)    GENERAL:alert, no distress and comfortable. He's mildly obese. SKIN: He is plethoric, texture, turgor are normal, no rashes or significant  lesions EYES: normal, conjunctiva are pink and non-injected, sclera clear OROPHARYNX:no exudate, no erythema and lips, buccal mucosa, and tongue normal  NECK: supple, thyroid normal size, non-tender, without nodularity LYMPH:  no palpable lymphadenopathy in the cervical, axillary or inguinal LUNGS: clear to auscultation and percussion with normal breathing effort HEART: regular rate & rhythm and no murmurs and no lower extremity edema ABDOMEN:abdomen soft, non-tender and normal bowel sounds Musculoskeletal:no cyanosis of digits and no clubbing  PSYCH: alert & oriented x 3 with fluent speech NEURO: no focal  motor/sensory deficits  LABORATORY DATA:  I have reviewed the data as listed Lab Results  Component Value Date   WBC 6.5 04/28/2014   HGB 18.9* 04/28/2014   HCT 55.9* 04/28/2014   MCV 98.1* 04/28/2014   PLT 155 04/28/2014   Review of the report of the CT scan with the patient. ASSESSMENT & PLAN:  Elevated serum immunoglobulin free light chains The patient has mildly elevated urine excretion of free light chains. His blood work showed no detectable M spike. However, his workup was incomplete. Will proceed to order an additional workup but I suspect it may be negative. I will call him with test results.  Erythrocytosis I am concerned about significant erythrocytosis. This is likely due to chronic testosterone replacement therapy. His profound muscle aches and fatigue could be signs of poor circulation. I recommend phlebotomy. During his phlebotomy, we encountered near syncopal episode. After 1 L of IV fluid resuscitation, his symptoms improved. I recommend he start donating blood regularly to help reduce risk of recurrent erythrocytosis and reduce the use of testosterone replacement therapy if possible. The patient is recommended to take aspirin.   Testosterone Deficiency He had chronic testosterone replacement therapy for many years. Recommend he discuss with his primary care provider  about medication taper due to profound erythrocytosis.     Orders Placed This Encounter  Procedures  . CBC with Differential    Standing Status: Future     Number of Occurrences: 1     Standing Expiration Date: 06/02/2015  . Comprehensive metabolic panel    Standing Status: Future     Number of Occurrences: 1     Standing Expiration Date: 06/02/2015  . Kappa/lambda light chains    Standing Status: Future     Number of Occurrences: 1     Standing Expiration Date: 06/02/2015  . SPEP & IFE with QIG    Standing Status: Future     Number of Occurrences: 1     Standing Expiration Date: 06/02/2015  . Erythropoietin    Standing Status: Future     Number of Occurrences: 1     Standing Expiration Date: 06/02/2015    All questions were answered. The patient knows to call the clinic with any problems, questions or concerns. I spent 40 minutes counseling the patient face to face. The total time spent in the appointment was 60 minutes and more than 50% was on counseling.     [facility], Joseff Luckman, MD 04/28/2014 8:46 PM

## 2014-04-28 NOTE — Assessment & Plan Note (Signed)
The patient has mildly elevated urine excretion of free light chains. His blood work showed no detectable M spike. However, his workup was incomplete. Will proceed to order an additional workup but I suspect it may be negative. I will call him with test results.

## 2014-04-29 ENCOUNTER — Ambulatory Visit: Payer: Self-pay | Admitting: Emergency Medicine

## 2014-04-29 ENCOUNTER — Ambulatory Visit (INDEPENDENT_AMBULATORY_CARE_PROVIDER_SITE_OTHER): Payer: Medicare Other | Admitting: Internal Medicine

## 2014-04-29 ENCOUNTER — Encounter: Payer: Self-pay | Admitting: Internal Medicine

## 2014-04-29 VITALS — BP 118/76 | HR 72 | Temp 98.6°F | Resp 16 | Ht 70.75 in | Wt 209.0 lb

## 2014-04-29 DIAGNOSIS — E785 Hyperlipidemia, unspecified: Secondary | ICD-10-CM

## 2014-04-29 DIAGNOSIS — E559 Vitamin D deficiency, unspecified: Secondary | ICD-10-CM

## 2014-04-29 DIAGNOSIS — I10 Essential (primary) hypertension: Secondary | ICD-10-CM

## 2014-04-29 DIAGNOSIS — R7309 Other abnormal glucose: Secondary | ICD-10-CM

## 2014-04-29 DIAGNOSIS — Z79899 Other long term (current) drug therapy: Secondary | ICD-10-CM

## 2014-04-29 LAB — CBC WITH DIFFERENTIAL/PLATELET
Basophils Absolute: 0 10*3/uL (ref 0.0–0.1)
Basophils Relative: 0 % (ref 0–1)
Eosinophils Absolute: 0.2 10*3/uL (ref 0.0–0.7)
Eosinophils Relative: 3 % (ref 0–5)
HCT: 51.8 % (ref 39.0–52.0)
Hemoglobin: 18.7 g/dL — ABNORMAL HIGH (ref 13.0–17.0)
Lymphocytes Relative: 16 % (ref 12–46)
Lymphs Abs: 1 10*3/uL (ref 0.7–4.0)
MCH: 34 pg (ref 26.0–34.0)
MCHC: 36.1 g/dL — ABNORMAL HIGH (ref 30.0–36.0)
MCV: 94.2 fL (ref 78.0–100.0)
Monocytes Absolute: 0.7 10*3/uL (ref 0.1–1.0)
Monocytes Relative: 11 % (ref 3–12)
Neutro Abs: 4.3 10*3/uL (ref 1.7–7.7)
Neutrophils Relative %: 70 % (ref 43–77)
Platelets: 150 10*3/uL (ref 150–400)
RBC: 5.5 MIL/uL (ref 4.22–5.81)
RDW: 14 % (ref 11.5–15.5)
WBC: 6.2 10*3/uL (ref 4.0–10.5)

## 2014-04-29 MED ORDER — VALSARTAN 320 MG PO TABS: ORAL_TABLET | ORAL | Status: DC

## 2014-04-29 NOTE — Progress Notes (Signed)
Patient ID: Curtis Price, male   DOB: 1946/03/28, 68 y.o.   MRN: [id number]   This very nice 68 y.o.MWM presents for 3 month follow up with Hypertension, Hyperlipidemia, Pre-Diabetes and Vitamin D Deficiency.    Patient has long hx/o myalgias/arthralgias - suspect in the past for Fibromyalgia, but recent screening found lambda light chains in the urine and he was referred for hematology consultation and seen by Dr Alvy Bimler who ordered further studies and also a phlebotomy for erythrocythemia yesterday. Apparently, patient had a syncopal episode during the phlebotomy.   Patient is treated for HTN & BP has been controlled at home. Today's BP: 118/76 mmHg. Patient denies any cardiac type chest pain, palpitations, dyspnea/orthopnea/PND, dizziness, claudication, or dependent edema.   Hyperlipidemia is controlled with diet & meds. Patient denies myalgias or other med SE's. Last Lipids were 01/26/2014: Cholesterol, Total 194; HDL Cholesterol by NMR 44; LDL (calc) 110*; Triglycerides 202*   Also, the patient has history of PreDiabetes and patient denies any symptoms of reactive hypoglycemia, diabetic polys, paresthesias or visual blurring.  Last A1c was 01/26/2014: Hemoglobin-A1c 5.3    Further, Patient has history of Vitamin D Deficiency and patient supplements vitamin D without any suspected side-effects. Last vitamin D was 01/26/2014: Vit D, 25-Hydroxy 64     Medication List   acyclovir 800 MG tablet  Commonly known as:  ZOVIRAX     ASPIR-LOW 81 MG EC tablet  Generic drug:  aspirin  Take 81 mg by mouth daily.     baclofen 10 MG tablet  Commonly known as:  LIORESAL  1/2 to 1 tablet 2 x day for muscle aches     buPROPion 300 MG 24 hr tablet  Commonly known as:  WELLBUTRIN XL  Take 1 tablet (300 mg total) by mouth daily. For mood     colchicine 0.6 MG tablet  Take 1 tablet (0.6 mg total) by mouth daily.     fish oil-omega-3 fatty acids 1000 MG capsule  Take 1 capsule by mouth 2 (two) times daily.      Magnesium 250 MG Tabs  Take 1 tablet by mouth 2 (two) times daily.     meloxicam 15 MG tablet  Commonly known as:  MOBIC     pantoprazole 40 MG tablet  Commonly known as:  PROTONIX  Take 1 tablet (40 mg total) by mouth daily. For acid reflux and indigestion     sildenafil 20 MG tablet  Commonly known as:  REVATIO  Take 1 to 5 pills once daily as needed for XXXX     STRESS B COMPLEX/ZINC Tabs  Take 1 tablet by mouth daily.     Testosterone 20.25 MG/1.25GM (1.62%) Gel  Commonly known as:  ANDROGEL  Place 4 Squirts onto the skin daily. Daily   - disp 6 bottles (90 day supply) with refills for 6 months     traMADol 50 MG tablet  Commonly known as:  ULTRAM  1 tablet 2 x day for pain     valsartan-hydrochlorothiazide 320-25 MG per tablet  Commonly known as:  DIOVAN-HCT  Take 1/2  daily as directed for BP       Allergies  Allergen Reactions  . Atenolol   . Atorvastatin   . Darvocet [Propoxyphene N-Acetaminophen]   . Paroxetine   . Pregabalin   . Propoxyphene Hcl    PMHx:   Past Medical History  Diagnosis Date  . BPH (benign prostatic hypertrophy)   . GERD (gastroesophageal reflux disease)   .  Fibromyalgia   . Hypogonadism male   . History of epididymitis   . Myofascial pain syndrome   . Nephrolithiasis   . Prostatitis     Recurrent  . Prolapsed internal hemorrhoids   . History of head injury without skull fracture 1980    Concussion  . Tendinitis 2000    Hx of left bicep  . Rheumatic fever     x3 at ages [age] and 36; No significant valvular abnormality on 5/11 echo  . Exertional dyspnea     ETT myoview 5/11: 8 min, no ischemic ECG changes, stopped due to fatigue/ SOB. EF 60%, no ischemia or infarction. Echo 5/11 EF 60%, no significant valvular abnormalities, mild RV dilation and dysfunction, no complete TR doppler jet so PA pressure hard to gauge. 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 R heart cath 6/11: mean RA 10 mmHg, PA 25/18,  mean PCWP 13 mmHg, CI 2.1  . Hypertension   . Hyperlipidemia     Myalgias with Lipitor  . Vitamin D deficiency   . 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 Erythrocytosis 04/28/2014   FHx:    Reviewed / unchanged SHx:    Reviewed / unchanged  Systems Review:  Constitutional: Denies fever, chills, wt changes, headaches, insomnia, fatigue, night sweats, change in appetite. Eyes: Denies redness, blurred vision, diplopia, discharge, itchy, watery eyes.  ENT: Denies discharge, congestion, post nasal drip, epistaxis, sore throat, earache, hearing loss, dental pain, tinnitus, vertigo, sinus pain, snoring.  CV: Denies chest pain, palpitations, irregular heartbeat, syncope, dyspnea, diaphoresis, orthopnea, PND, claudication or edema. Respiratory: denies cough, dyspnea, DOE, pleurisy, hoarseness, laryngitis, wheezing.  Gastrointestinal: Denies dysphagia, odynophagia, heartburn, reflux, water brash, abdominal pain or cramps, nausea, vomiting, bloating, diarrhea, constipation, hematemesis, melena, hematochezia  or hemorrhoids. Genitourinary: Denies dysuria, frequency, urgency, nocturia, hesitancy, discharge, hematuria or flank pain. Musculoskeletal: Denies arthralgias, myalgias, stiffness, jt. swelling, pain, limping or strain/sprain.  Skin: Denies pruritus, rash, hives, warts, acne, eczema or change in skin lesion(s). Neuro: No weakness, tremor, incoordination, spasms, paresthesia or pain. Psychiatric: Denies confusion, memory loss or sensory loss. Endo: Denies change in weight, skin or hair change.  Heme/Lymph: No excessive bleeding, bruising or enlarged lymph nodes.  Exam:  BP 118/76  Pulse 72  Temp98.6 F   Resp 16  Ht 5' 10.75"   Wt 209 lb  BMI 29.36 kg/m2  Appears well nourished and in no distress. Eyes: PERRLA, EOMs, conjunctiva no swelling or erythema. Sinuses: No frontal/maxillary tenderness ENT/Mouth: EAC's clear, TM's nl w/o erythema, bulging. Nares clear w/o  erythema, swelling, exudates. Oropharynx clear without erythema or exudates. Oral hygiene is good. Tongue normal, non obstructing. Hearing intact.  Neck: Supple. Thyroid nl. Car 2+/2+ without bruits, nodes or JVD. Chest: Respirations nl with BS clear & equal w/o rales, rhonchi, wheezing or stridor.  Cor: Heart sounds normal w/ regular rate and rhythm without sig. murmurs, gallops, clicks, or rubs. Peripheral pulses normal and equal  without edema.  Abdomen: Soft & bowel sounds normal. Non-tender w/o guarding, rebound, hernias, masses, or organomegaly.  Lymphatics: Unremarkable.  Musculoskeletal: Full ROM all peripheral extremities, joint stability, 5/5 strength, and normal gait.  Skin: Warm, dry without exposed rashes, lesions or ecchymosis apparent.  Neuro: Cranial nerves intact, reflexes equal bilaterally. Sensory-motor testing grossly intact. Tendon reflexes grossly intact.  Pysch: Alert & oriented x 3. Insight and judgement nl & appropriate. No ideations.  Assessment and Plan:  1. Hypertension - Continue monitor blood pressure at  home. Diovan/HCT switched to Diovan 320 alone   2. Hyperlipidemia - Continue diet/meds, exercise,& lifestyle modifications. Continue monitor periodic cholesterol/liver & renal functions   3. Pre-Diabetes - Continue diet, exercise, lifestyle modifications. Monitor appropriate labs.  4. Vitamin D Deficiency - Continue supplementation.  5. Testosterone Replacement w/ Erythrocythemia - Androgel dose decreased by 50%.  - Recommended regular exercise, BP monitoring, weight control, and discussed med and SE's. Recommended labs to assess and monitor clinical status. Further disposition pending results of labs.   - ROV in 6 weeks to re-evaluate BP, BMET and Testosterone Tx

## 2014-04-29 NOTE — Patient Instructions (Addendum)
Cut Androgel down to 2 pumps / daily  ++++++++++++++++++++++++++++++++++++++  Switch Valsartin / HCT to plain Valsartin 320 mg x 1/2 tablet / daily  ++++++++++++++++++++++++++++++++++++++++++++++  Recommend the book "The END of DIETING" by Dr Baker Janus   and the book "The END of DIABETES " by Dr Excell Seltzer  At [email or web address] - get book & Audio CD's      Being diabetic has a  300% increased risk for heart attack, stroke, cancer, and alzheimer- type vascular dementia. It is very important that you work harder with diet by avoiding all foods that are white except chicken & fish. Avoid white rice (brown & wild rice is OK), white potatoes (sweetpotatoes in moderation is OK), White bread or wheat bread or anything made out of white flour like bagels, donuts, rolls, buns, biscuits, cakes, pastries, cookies, pizza crust, and pasta (made from white flour & egg whites) - vegetarian pasta or spinach or wheat pasta is OK. Multigrain breads like Arnold's or Pepperidge Farm, or multigrain sandwich thins or flatbreads.  Diet, exercise and weight loss can reverse and cure diabetes in the early stages.  Diet, exercise and weight loss is very important in the control and prevention of complications of diabetes which affects every system in your body, ie. Brain - dementia/stroke, eyes - glaucoma/blindness, heart - heart attack/heart failure, kidneys - dialysis, stomach - gastric paralysis, intestines - malabsorption, nerves - severe painful neuritis, circulation - gangrene & loss of a leg(s), and finally cancer and Alzheimers.    I recommend avoid fried & greasy foods,  sweets/candy, white rice (brown or wild rice or Quinoa is OK), white potatoes (sweet potatoes are OK) - anything made from white flour - bagels, doughnuts, rolls, buns, biscuits,white and wheat breads, pizza crust and traditional pasta made of white flour & egg white(vegetarian pasta or spinach or wheat pasta is OK).  Multi-grain bread is OK - like  multi-grain flat bread or sandwich thins. Avoid alcohol in excess. Exercise is also important.    Eat all the vegetables you want - avoid meat, especially red meat and dairy - especially cheese.  Cheese is the most concentrated form of trans-fats which is the worst thing to clog up our arteries. Veggie cheese is OK which can be found in the fresh produce section at [facility] or [employer] or [employer]

## 2014-04-30 LAB — BASIC METABOLIC PANEL WITH GFR
BUN: 13 mg/dL (ref 6–23)
CO2: 28 mEq/L (ref 19–32)
Calcium: 9.2 mg/dL (ref 8.4–10.5)
Chloride: 101 mEq/L (ref 96–112)
Creat: 1.11 mg/dL (ref 0.50–1.35)
GFR, Est African American: 79 mL/min
GFR, Est Non African American: 68 mL/min
Glucose, Bld: 96 mg/dL (ref 70–99)
Potassium: 3.9 mEq/L (ref 3.5–5.3)
Sodium: 138 mEq/L (ref 135–145)

## 2014-04-30 LAB — HEPATIC FUNCTION PANEL
ALT: 43 U/L (ref 0–53)
AST: 25 U/L (ref 0–37)
Albumin: 4.2 g/dL (ref 3.5–5.2)
Alkaline Phosphatase: 54 U/L (ref 39–117)
Bilirubin, Direct: 0.1 mg/dL (ref 0.0–0.3)
Indirect Bilirubin: 0.7 mg/dL (ref 0.2–1.2)
Total Bilirubin: 0.8 mg/dL (ref 0.2–1.2)
Total Protein: 6.3 g/dL (ref 6.0–8.3)

## 2014-04-30 LAB — SPEP & IFE WITH QIG
Albumin ELP: 64 % (ref 55.8–66.1)
Alpha-1-Globulin: 4.3 % (ref 2.9–4.9)
Alpha-2-Globulin: 8.7 % (ref 7.1–11.8)
Beta 2: 4.2 % (ref 3.2–6.5)
Beta Globulin: 6.8 % (ref 4.7–7.2)
Gamma Globulin: 12 % (ref 11.1–18.8)
IgA: 93 mg/dL (ref 68–379)
IgG (Immunoglobin G), Serum: 798 mg/dL (ref 650–1600)
IgM, Serum: 60 mg/dL (ref 41–251)
Total Protein, Serum Electrophoresis: 6.6 g/dL (ref 6.0–8.3)

## 2014-04-30 LAB — HEMOGLOBIN A1C
Hgb A1c MFr Bld: 5.2 % (ref <5.7)
Mean Plasma Glucose: 103 mg/dL (ref <117)

## 2014-04-30 LAB — LIPID PANEL
Cholesterol: 179 mg/dL (ref 0–200)
HDL: 42 mg/dL (ref >39)
LDL Cholesterol: 100 mg/dL — ABNORMAL HIGH (ref 0–99)
Total CHOL/HDL Ratio: 4.3 Ratio
Triglycerides: 187 mg/dL — ABNORMAL HIGH (ref <150)
VLDL: 37 mg/dL (ref 0–40)

## 2014-04-30 LAB — KAPPA/LAMBDA LIGHT CHAINS
Kappa free light chain: 1 mg/dL (ref 0.33–1.94)
Kappa:Lambda Ratio: 1.25 (ref 0.26–1.65)
Lambda Free Lght Chn: 0.8 mg/dL (ref 0.57–2.63)

## 2014-04-30 LAB — ERYTHROPOIETIN: Erythropoietin: 26.9 m[IU]/mL — ABNORMAL HIGH (ref 2.6–18.5)

## 2014-04-30 LAB — VITAMIN D 25 HYDROXY (VIT D DEFICIENCY, FRACTURES): Vit D, 25-Hydroxy: 62 ng/mL (ref 30–89)

## 2014-04-30 LAB — MAGNESIUM: Magnesium: 1.9 mg/dL (ref 1.5–2.5)

## 2014-04-30 LAB — TSH: TSH: 2.669 u[IU]/mL (ref 0.350–4.500)

## 2014-04-30 LAB — INSULIN, FASTING: Insulin fasting, serum: 112 u[IU]/mL — ABNORMAL HIGH (ref 3–28)

## 2014-05-04 ENCOUNTER — Encounter: Payer: Self-pay | Admitting: *Deleted

## 2014-05-05 ENCOUNTER — Telehealth: Payer: Self-pay | Admitting: *Deleted

## 2014-05-05 NOTE — Telephone Encounter (Signed)
Spoke with pt at home and informed pt re: Per Dr. Alvy Bimler,  All other test results are OK;   MD recommends phlebotomy PRN.  Pt voiced understanding. Pt stated he has labs done with his primary Dr. Melford Aase every 3 months.

## 2014-05-29 ENCOUNTER — Other Ambulatory Visit: Payer: Self-pay | Admitting: Physician Assistant

## 2014-05-29 DIAGNOSIS — I10 Essential (primary) hypertension: Secondary | ICD-10-CM

## 2014-05-29 MED ORDER — VALSARTAN 320 MG PO TABS: ORAL_TABLET | ORAL | Status: DC

## 2014-05-29 MED ORDER — TRAMADOL HCL 50 MG PO TABS: ORAL_TABLET | ORAL | Status: DC

## 2014-06-11 ENCOUNTER — Ambulatory Visit: Payer: Self-pay | Admitting: Internal Medicine

## 2014-06-12 ENCOUNTER — Ambulatory Visit: Payer: Self-pay | Admitting: Internal Medicine

## 2014-06-22 ENCOUNTER — Encounter: Payer: Self-pay | Admitting: Internal Medicine

## 2014-06-22 ENCOUNTER — Ambulatory Visit (INDEPENDENT_AMBULATORY_CARE_PROVIDER_SITE_OTHER): Payer: Medicare Other | Admitting: Internal Medicine

## 2014-06-22 VITALS — BP 114/80 | HR 64 | Temp 98.1°F | Resp 16 | Ht 70.75 in | Wt 206.8 lb

## 2014-06-22 DIAGNOSIS — N419 Inflammatory disease of prostate, unspecified: Secondary | ICD-10-CM

## 2014-06-22 MED ORDER — MELOXICAM 15 MG PO TABS: ORAL_TABLET | ORAL | Status: DC

## 2014-06-22 MED ORDER — CIPROFLOXACIN HCL 500 MG PO TABS: 500.0000 mg | ORAL_TABLET | Freq: Two times a day (BID) | ORAL | Status: AC

## 2014-06-22 NOTE — Progress Notes (Signed)
Subjective:    Patient ID: Curtis Price., male    DOB: 09-Apr-1946, 68 y.o.   MRN: [id number]  HPI 68 yo MWM with hx/o recurrent prostatitis presents with 3 day hx/o perineal and rt testicular aching. No fever, chill, dysuria or prostatism Sx's.   Medication List   ASPIR-LOW 81 MG EC tablet  Generic drug:  aspirin  Take 81 mg by mouth daily.     baclofen 10 MG tablet  Commonly known as:  LIORESAL     buPROPion 300 MG 24 hr tablet  Commonly known as:  WELLBUTRIN XL  Take 1 tablet (300 mg total) by mouth daily. For mood     colchicine 0.6 MG tablet  Take 1 tablet (0.6 mg total) by mouth daily.     fish oil-omega-3 fatty acids 1000 MG capsule  Take 1 capsule by mouth 2 (two) times daily.     Magnesium 250 MG Tabs  Take 1 tablet by mouth 2 (two) times daily.     pantoprazole 40 MG tablet  Commonly known as:  PROTONIX  Take 1 tablet (40 mg total) by mouth daily. For acid reflux and indigestion     sildenafil 20 MG tablet  Commonly known as:  REVATIO  Take 1 to 5 pills once daily as needed for XXXX     STRESS B COMPLEX/ZINC Tabs  Take 1 tablet by mouth daily.     Testosterone 20.25 MG/1.25GM (1.62%) Gel  Commonly known as:  ANDROGEL  Place 4 Squirts onto the skin daily. Daily   - disp 6 bottles (90 day supply) with refills for 6 months     traMADol 50 MG tablet  Commonly known as:  ULTRAM  1 tablet 2 x day for pain     valsartan 320 MG tablet  Commonly known as:  DIOVAN  Take 1/2 to 1 tablet daily as directed for BP     Allergies  Allergen Reactions  . Atenolol   . Atorvastatin   . Darvocet [Propoxyphene N-Acetaminophen]   . Paroxetine   . Pregabalin   . Propoxyphene Hcl    Past Medical History  Diagnosis Date  . BPH (benign prostatic hypertrophy)   . GERD (gastroesophageal reflux disease)   . Fibromyalgia   . Hypogonadism male   . History of epididymitis   . Myofascial pain syndrome   . Nephrolithiasis   . Prostatitis     Recurrent  . Prolapsed  internal hemorrhoids   . History of head injury without skull fracture 1980    Concussion  . Tendinitis 2000    Hx of left bicep  . Rheumatic fever     x3 at ages [age] and 69; No significant valvular abnormality on 5/11 echo  . Exertional dyspnea     ETT myoview 5/11: 8 min, no ischemic ECG changes, stopped due to fatigue/ SOB. EF 60%, no ischemia or infarction. Echo 5/11 EF 60%, no significant valvular abnormalities, mild RV dilation and dysfunction, no complete TR doppler jet so PA pressure hard to gauge. 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 R heart cath 6/11: mean RA 10 mmHg, PA 25/18, mean PCWP 13 mmHg, CI 2.1  . Hypertension   . Hyperlipidemia     Myalgias with Lipitor  . Vitamin D deficiency   . 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 Erythrocytosis 04/28/2014   Review of Systems Negative except as above  Objective:   Physical Exam BP 114/80  Pulse 64  Temp 98.1 F   Resp 16  Ht 5' 10.75"   Wt 206 lb 12.8 oz )  BMI 29.05   Exam limited to DRE finds prostate 2-3 + boggy and tender.  Assessment & Plan:   1. Prostatitis, unspecified  - Rx Cipro 500 mg  #56  & 1 rf - 1 tab bid  - Rx Meloxicam 15 mg #90  1/2 to 1 tab qd with food - prn pain/inflammation.

## 2014-06-22 NOTE — Progress Notes (Deleted)
Patient ID: Curtis Price., male   DOB: 1946/02/08, 68 y.o.   MRN: [id number]

## 2014-06-23 ENCOUNTER — Ambulatory Visit: Payer: Self-pay | Admitting: Internal Medicine

## 2014-07-23 ENCOUNTER — Encounter: Payer: Self-pay | Admitting: Internal Medicine

## 2014-08-13 ENCOUNTER — Encounter: Payer: Self-pay | Admitting: Internal Medicine

## 2014-08-13 ENCOUNTER — Ambulatory Visit (INDEPENDENT_AMBULATORY_CARE_PROVIDER_SITE_OTHER): Payer: Medicare Other | Admitting: Internal Medicine

## 2014-08-13 VITALS — BP 132/82 | HR 72 | Temp 98.1°F | Resp 16 | Ht 70.0 in | Wt 211.0 lb

## 2014-08-13 DIAGNOSIS — Z9181 History of falling: Secondary | ICD-10-CM

## 2014-08-13 DIAGNOSIS — R6889 Other general symptoms and signs: Secondary | ICD-10-CM

## 2014-08-13 DIAGNOSIS — Z1331 Encounter for screening for depression: Secondary | ICD-10-CM

## 2014-08-13 DIAGNOSIS — E785 Hyperlipidemia, unspecified: Secondary | ICD-10-CM

## 2014-08-13 DIAGNOSIS — I10 Essential (primary) hypertension: Secondary | ICD-10-CM

## 2014-08-13 DIAGNOSIS — K219 Gastro-esophageal reflux disease without esophagitis: Secondary | ICD-10-CM

## 2014-08-13 DIAGNOSIS — Z79899 Other long term (current) drug therapy: Secondary | ICD-10-CM

## 2014-08-13 DIAGNOSIS — Z23 Encounter for immunization: Secondary | ICD-10-CM

## 2014-08-13 DIAGNOSIS — Z125 Encounter for screening for malignant neoplasm of prostate: Secondary | ICD-10-CM

## 2014-08-13 DIAGNOSIS — E291 Testicular hypofunction: Secondary | ICD-10-CM

## 2014-08-13 DIAGNOSIS — Z0001 Encounter for general adult medical examination with abnormal findings: Secondary | ICD-10-CM

## 2014-08-13 DIAGNOSIS — Z1212 Encounter for screening for malignant neoplasm of rectum: Secondary | ICD-10-CM

## 2014-08-13 DIAGNOSIS — R7309 Other abnormal glucose: Secondary | ICD-10-CM

## 2014-08-13 DIAGNOSIS — E559 Vitamin D deficiency, unspecified: Secondary | ICD-10-CM

## 2014-08-13 LAB — CBC WITH DIFFERENTIAL/PLATELET
Basophils Absolute: 0.1 10*3/uL (ref 0.0–0.1)
Basophils Relative: 1 % (ref 0–1)
Eosinophils Absolute: 0.4 10*3/uL (ref 0.0–0.7)
Eosinophils Relative: 6 % — ABNORMAL HIGH (ref 0–5)
HCT: 45.7 % (ref 39.0–52.0)
Hemoglobin: 16.7 g/dL (ref 13.0–17.0)
Lymphocytes Relative: 21 % (ref 12–46)
Lymphs Abs: 1.3 10*3/uL (ref 0.7–4.0)
MCH: 32.6 pg (ref 26.0–34.0)
MCHC: 36.5 g/dL — ABNORMAL HIGH (ref 30.0–36.0)
MCV: 89.1 fL (ref 78.0–100.0)
MPV: 11.2 fL (ref 9.4–12.4)
Monocytes Absolute: 0.7 10*3/uL (ref 0.1–1.0)
Monocytes Relative: 11 % (ref 3–12)
Neutro Abs: 3.8 10*3/uL (ref 1.7–7.7)
Neutrophils Relative %: 61 % (ref 43–77)
Platelets: 171 10*3/uL (ref 150–400)
RBC: 5.13 MIL/uL (ref 4.22–5.81)
RDW: 12.9 % (ref 11.5–15.5)
WBC: 6.3 10*3/uL (ref 4.0–10.5)

## 2014-08-13 NOTE — Progress Notes (Signed)
Patient ID: Curtis Price., male   DOB: 25-Mar-1946, 68 y.o.   MRN: [id number]  [facility] VISIT AND CPE  Assessment:   1. Encounter for general adult medical examination with abnormal findings   2. Essential hypertension  - Microalbumin / creatinine urine ratio - EKG 12-Lead - [place], RETROPERITNL ABD,  LTD - TSH  3. Hyperlipidemia  - Lipid panel  4. Abnormal glucose  - Hemoglobin A1c - Insulin, fasting  5. Vitamin D deficiency  - Vit D  25 hydroxy (rtn osteoporosis monitoring)  6. Testosterone Deficiency  - Testosterone  7. Gastroesophageal reflux disease, esophagitis presence not specified   8. Screening for rectal cancer  - POC Hemoccult Bld/Stl (3-Cd Home Screen); Future  9. Prostate cancer screening  - PSA  10. Depression screen   11. At low risk for fall   12. Medication management  - Urine Microscopic - CBC with Differential - BASIC METABOLIC PANEL WITH GFR - Hepatic function panel - Magnesium  13. Need for prophylactic vaccination and inoculation against influenza  - Flu vaccine HIGH DOSE PF  14. Need for prophylactic vaccination against Streptococcus pneumoniae (pneumococcus)  - Pneumococcal conjugate vaccine 13-valent IM   Plan:   During the course of the visit the patient was educated and counseled about appropriate screening and preventive services including:    Pneumococcal vaccine   Influenza vaccine  Td vaccine  Screening electrocardiogram  Bone densitometry screening  Colorectal cancer screening  Diabetes screening  Glaucoma screening  Nutrition counseling   Advanced directives: requested  Screening recommendations, referrals: Vaccinations: Tdap vaccine 04/2009 Influenza vaccine HD 08/13/2014 Pneumococcal vaccine 07/12/2012 Prevnar vaccine 08/13/2014 Shingles vaccine 07/27/2007 Hep B vaccine not indicated  Nutrition assessed and recommended  Colonoscopy 2008 - due 10 yr f/u - Dr  Sharlett Iles Recommended yearly ophthalmology/optometry visit for glaucoma screening and checkup Recommended yearly dental visit for hygiene and checkup Advanced directives - no, but given forms today  Conditions/risks identified: BMI: Discussed weight loss, diet, and increase physical activity.  Increase physical activity: AHA recommends 150 minutes of physical activity a week.  Medications reviewed  No Hx/o Diabetes,but Patent is on an ARB. Urinary Incontinence is not an issue: discussed non pharmacology and pharmacology options.  Fall risk: low- discussed PT, home fall assessment, medications.    Subjective:  Curtis Price. is a 68 y.o. male who presents for Medicare Annual Wellness & Preventative Visit and complete physical.  Date of last medicare wellness visit is unknown.  He has had elevated blood pressure since 1994. His blood pressure has been controlled at home, today their BP is BP: 132/82 mmHg He does workout. He denies chest pain, shortness of breath, dizziness.  He is not on cholesterol medication and denies myalgias. His cholesterol is at goal. The cholesterol last visit was:  Lab Results  Component Value Date   CHOL 179 04/29/2014   HDL 42 04/29/2014   LDLCALC 100* 04/29/2014   LDLDIRECT 151.0 02/10/2010   TRIG 187* 04/29/2014   CHOLHDL 4.3 04/29/2014   He has not had diabetes or prediabetes. Last A1C in the office was:  Lab Results  Component Value Date   HGBA1C 5.2 04/29/2014   Patient is on Vitamin D supplement.  (Vit D was 27 in 2008) Lab Results  Component Value Date   VD25OH 62 04/29/2014      Names of Other Physician/Practitioners you currently use: 1. [facility] here for primary care 2. [facility], eye  doctor, last visit 2014 3. Dr Vanessa Kick, dentist, last visit 2015  Patient Care Team: [name], MD as PCP - General (Internal Medicine) Larey Dresser, MD as Consulting Physician  (Cardiology)  Medication Review: Medication Sig  . aspirin (ASPIR-LOW) 81 MG EC tablet Take 81 mg by mouth daily.    . baclofen (LIORESAL) 10 MG tablet   . buPROPion (WELLBUTRIN XL) 300 MG 24 hr tablet Take 1 tablet (300 mg total) by mouth daily. For mood  . colchicine 0.6 MG tablet Take 1 tablet (0.6 mg total) by mouth daily.  . fish oil-omega-3 fatty acids 1000 MG capsule Take 1 capsule by mouth 2 (two) times daily.    . Magnesium 250 MG TABS Take 1 tablet by mouth 2 (two) times daily.    . meloxicam (MOBIC) 15 MG tablet Take 1/2 to 1 tablet daily with food for pain & inflammation  . Multiple Vitamins-Minerals (STRESS B COMPLEX/ZINC) TABS Take 1 tablet by mouth daily.    . pantoprazole (PROTONIX) 40 MG tablet Take 1 tablet (40 mg total) by mouth daily. For acid reflux and indigestion  . sildenafil (REVATIO) 20 MG tablet Take 1 to 5 pills once daily as needed for XXXX  . Testosterone (ANDROGEL) 20.25 MG/1.25GM (1.62%) GEL Place 4 Squirts onto the skin daily. Daily   - disp 6 bottles (90 day supply) with refills for 6 months  . traMADol (ULTRAM) 50 MG tablet 1 tablet 2 x day for pain  . valsartan (DIOVAN) 320 MG tablet Take 1/2 to 1 tablet daily as directed for BP   Current Problems (verified) Patient Active Problem List   Diagnosis Date Noted  . 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 Erythrocytosis 04/28/2014  . Abnormal glucose 01/26/2014  . Encounter for long-term (current) use of other medications 11/19/2013  . BPH (benign prostatic hypertrophy)   . GERD   . Fibromyalgia   . Testosterone Deficiency   . Hyperlipidemia   . Vitamin D deficiency   . Essential hypertension 02/15/2010    Screening Tests Health Maintenance  Topic Date Due  . COLONOSCOPY  03/25/1997  . INFLUENZA VACCINE  04/26/2015  . TETANUS/TDAP  04/26/2019  . PNEUMOCOCCAL POLYSACCHARIDE VACCINE AGE 75 AND OVER  Completed  . ZOSTAVAX  Completed    Immunization History  Administered  Date(s) Administered  . Influenza, High Dose Seasonal PF 08/13/2014  . Pneumococcal Conjugate-13 08/13/2014  . Pneumococcal Polysaccharide-23 07/12/2012  . Tdap 04/25/2009  . Zoster 07/27/2007    Preventative care: Last colonoscopy: 2008  Prior vaccinations: Tdap: 04/25/2009  Influenza: HD 08/13/2014  Pneumococcal: 07/12/2012 Shingles/Zostavax: 07/27/2007  History reviewed: allergies, current medications, past family history, past medical history, past social history, past surgical history and problem list  Risk Factors: Tobacco History  Substance Use Topics  . Smoking status: Never Smoker   . Smokeless tobacco: Never Used     Comment: Rare cigars  . Alcohol Use: Yes     Comment: Occasional social drinker;cocktail, beer, or mixed drink daily   He does smoke occasional cigar.  Patient is a former smoker. Are there smokers in your home (other than you)?  No  Alcohol Current alcohol use: 5 beers per day(s)per week.  Caffeine Current caffeine use: coffee 0-1 cup/day /day and caffeinated soft drinks 1 /day  Exercise Current exercise: walking  Nutrition/Diet Current diet: in general, a "healthy" diet    Cardiac risk factors: advanced age (older than 72 for men, 63 for women), dyslipidemia, hypertension, male gender,  obesity (BMI >= 30 kg/m2), sedentary lifestyle and smoking/ tobacco exposure.  Depression Screen (Note: if answer to either of the following is "Yes", a more complete depression screening is indicated)   Q1: Over the past two weeks, have you felt down, depressed or hopeless? No  Q2: Over the past two weeks, have you felt little interest or pleasure in doing things? No  Have you lost interest or pleasure in daily life? No  Do you often feel hopeless? No  Do you cry easily over simple problems? No  Activities of Daily Living In your present state of health, do you have any difficulty performing the following activities?:  Driving? No Managing money?   No Feeding yourself? No Getting from bed to chair? No Climbing a flight of stairs? No Preparing food and eating?: No Bathing or showering? No Getting dressed: No Getting to the toilet? No Using the toilet:No Moving around from place to place: No In the past year have you fallen or had a near fall?:No   Are you sexually active?  Yes  Do you have more than one partner?  No  Vision Difficulties: No  Hearing Difficulties: No Do you often ask people to speak up or repeat themselves? No Do you experience ringing or noises in your ears? No Do you have difficulty understanding soft or whispered voices? No  Cognition  Do you feel that you have a problem with memory?No  Do you often misplace items? No  Do you feel safe at home?  Yes  Advanced directives Does patient have a [place]? No Does patient have a Living Will? Yes  In addition to the HPI above,  No Fever-chills,  No Headache, No changes with Vision or hearing,  No problems swallowing food or Liquids,  No Chest pain or productive Cough or Shortness of Breath,  No Abdominal pain, No Nausea or Vomitting, Bowel movements are regular,  No Blood in stool or Urine,  No dysuria,  No new skin rashes or bruises,  No new joints pains-aches,  No new weakness, tingling, numbness in any extremity,  No recent weight loss,  No polyuria, polydypsia or polyphagia,  No significant Mental Stressors.  A full 10 point Review of Systems was done, except as stated above, all other Review of Systems were negative  Objective:     Blood pressure 132/82, pulse 72, temperature 98.1 F (36.7 C), resp. rate 16, height 5\' 10"  (1.778 m), weight 211 lb (95.709 kg). Body mass index is 30.28 kg/(m^2).  General appearance: alert, no distress, WD/WN, male Cognitive Testing  Alert? Yes  Normal Appearance? Yes  Oriented to person? Yes  Place? Yes   Time? Yes  Recall of three objects?  Yes  Can perform simple calculations?  Yes  Displays appropriate judgment? Yes  Can read the correct time from a watch/clock? Yes  HEENT: normocephalic, sclerae anicteric, TMs pearly, nares patent, no discharge or erythema, pharynx normal Oral cavity: MMM, no lesions Neck: supple, no lymphadenopathy, no thyromegaly, no masses Heart: RRR, normal S1, S2, no murmurs Lungs: CTA bilaterally, no wheezes, rhonchi, or rales Abdomen: +bs, soft, non tender, non distended, no masses, no hepatomegaly, no splenomegaly GU: Nl Male and testes/phallus - nl. DRE -prostate benign & nl for age Musculoskeletal: nontender, no swelling, no obvious deformity Extremities: no edema, no cyanosis, no clubbing Pulses: 2+ symmetric, upper and lower extremities, normal cap refill Neurological: alert, oriented x 3, CN2-12 intact, strength normal upper extremities and lower extremities, sensation  normal throughout, DTRs 2+ throughout, no cerebellar signs, gait normal Psychiatric: normal affect, behavior normal, pleasant   Medicare Attestation I have personally reviewed: The patient's medical and social history Their use of alcohol, tobacco or illicit drugs Their current medications and supplements The patient's functional ability including ADLs,fall risks, home safety risks, cognitive, and hearing and visual impairment Diet and physical activities Evidence for depression or mood disorders  The patient's weight, height, BMI, and visual acuity have been recorded in the chart.  I have made referrals, counseling, and provided education to the patient based on review of the above and I have provided the patient with a written personalized care plan for preventive services.    Ryelynn Guedea DAVID, MD   08/13/2014

## 2014-08-13 NOTE — Patient Instructions (Signed)

## 2014-08-14 LAB — BASIC METABOLIC PANEL WITH GFR
BUN: 17 mg/dL (ref 6–23)
CO2: 26 mEq/L (ref 19–32)
Calcium: 9.1 mg/dL (ref 8.4–10.5)
Chloride: 104 mEq/L (ref 96–112)
Creat: 1.06 mg/dL (ref 0.50–1.35)
GFR, Est African American: 84 mL/min
GFR, Est Non African American: 72 mL/min
Glucose, Bld: 95 mg/dL (ref 70–99)
Potassium: 4.1 mEq/L (ref 3.5–5.3)
Sodium: 138 mEq/L (ref 135–145)

## 2014-08-14 LAB — LIPID PANEL
Cholesterol: 187 mg/dL (ref 0–200)
HDL: 42 mg/dL (ref >39)
Total CHOL/HDL Ratio: 4.5 Ratio
Triglycerides: 410 mg/dL — ABNORMAL HIGH (ref <150)

## 2014-08-14 LAB — HEPATIC FUNCTION PANEL
ALT: 47 U/L (ref 0–53)
AST: 25 U/L (ref 0–37)
Albumin: 4 g/dL (ref 3.5–5.2)
Alkaline Phosphatase: 59 U/L (ref 39–117)
Bilirubin, Direct: 0.1 mg/dL (ref 0.0–0.3)
Indirect Bilirubin: 0.6 mg/dL (ref 0.2–1.2)
Total Bilirubin: 0.7 mg/dL (ref 0.2–1.2)
Total Protein: 6 g/dL (ref 6.0–8.3)

## 2014-08-14 LAB — MICROALBUMIN / CREATININE URINE RATIO
Creatinine, Urine: 39.4 mg/dL
Microalb, Ur: <0.2 mg/dL (ref <2.0)

## 2014-08-14 LAB — URINALYSIS, MICROSCOPIC ONLY
Bacteria, UA: NONE SEEN
Casts: NONE SEEN
Crystals: NONE SEEN
Squamous Epithelial / LPF: NONE SEEN

## 2014-08-14 LAB — TESTOSTERONE: Testosterone: 380 ng/dL (ref 300–890)

## 2014-08-14 LAB — PSA: PSA: 0.9 ng/mL (ref <=4.00)

## 2014-08-14 LAB — TSH: TSH: 2.825 u[IU]/mL (ref 0.350–4.500)

## 2014-08-14 LAB — HEMOGLOBIN A1C
Hgb A1c MFr Bld: 5.4 % (ref <5.7)
Mean Plasma Glucose: 108 mg/dL (ref <117)

## 2014-08-14 LAB — VITAMIN D 25 HYDROXY (VIT D DEFICIENCY, FRACTURES): Vit D, 25-Hydroxy: 39 ng/mL (ref 30–100)

## 2014-08-14 LAB — INSULIN, FASTING: Insulin fasting, serum: 20.7 u[IU]/mL — ABNORMAL HIGH (ref 2.0–19.6)

## 2014-08-14 LAB — MAGNESIUM: Magnesium: 1.9 mg/dL (ref 1.5–2.5)

## 2014-08-18 ENCOUNTER — Other Ambulatory Visit: Payer: Self-pay | Admitting: *Deleted

## 2014-08-18 DIAGNOSIS — E291 Testicular hypofunction: Secondary | ICD-10-CM

## 2014-08-18 DIAGNOSIS — I10 Essential (primary) hypertension: Secondary | ICD-10-CM

## 2014-08-18 MED ORDER — TRAMADOL HCL 50 MG PO TABS: ORAL_TABLET | ORAL | Status: DC

## 2014-08-18 MED ORDER — VALSARTAN 320 MG PO TABS: ORAL_TABLET | ORAL | Status: DC

## 2014-08-18 MED ORDER — TESTOSTERONE 20.25 MG/1.25GM (1.62%) TD GEL: 4.0000 | Freq: Every day | TRANSDERMAL | Status: DC

## 2014-09-30 ENCOUNTER — Telehealth: Payer: Self-pay | Admitting: *Deleted

## 2014-09-30 ENCOUNTER — Other Ambulatory Visit: Payer: Self-pay | Admitting: Internal Medicine

## 2014-09-30 DIAGNOSIS — K219 Gastro-esophageal reflux disease without esophagitis: Secondary | ICD-10-CM

## 2014-09-30 MED ORDER — ESOMEPRAZOLE MAGNESIUM 40 MG PO CPDR: 40.0000 mg | DELAYED_RELEASE_CAPSULE | Freq: Every day | ORAL | Status: DC

## 2014-09-30 NOTE — Telephone Encounter (Signed)
Patient called and states Pantoprazole 40 mg not helping his reflux.  Patient was up all night and felt nauseated.  Per  Dr Melford Aase, take 2 pantoprazole 40 mg daily until he uses med up and then start the new RX for Nexium sent in to [facility] by Dr Myrene Galas.  If this med change does not help, patient will need to see a GI doctor.  Left message to inform patient.

## 2014-11-11 ENCOUNTER — Telehealth: Payer: Self-pay | Admitting: Internal Medicine

## 2014-11-11 NOTE — Telephone Encounter (Signed)
resch needed 11-16-14 with JC

## 2014-11-16 ENCOUNTER — Ambulatory Visit: Payer: Self-pay | Admitting: Physician Assistant

## 2014-11-22 NOTE — Patient Instructions (Signed)

## 2014-11-22 NOTE — Progress Notes (Signed)
Patient ID: Curtis Price., male   DOB: 1946-07-09, 69 y.o.   MRN: [id number]  MEDICARE ANNUAL WELLNESS VISIT AND OV  Assessment:   1. Essential hypertension  - TSH  2. Hyperlipidemia  - Lipid panel  3. Abnormal glucose  - Hemoglobin A1c - Insulin, fasting  4. Vitamin D deficiency  - Vit D  25 hydroxy   5. Fibromyalgia   6. Testosterone Deficiency  - Testosterone  7. Gastroesophageal reflux disease  - ranitidine (ZANTAC) 300 MG tablet; Take 1 tablet 2 x day to allow taper off Nexium for heartburn  Dispense: 180 tablet; Refill: 1  8. Depression screen  - Depression Screen Negative  9. Depression, controlled   10. Medication management  - CBC with Differential/Platelet - BASIC METABOLIC PANEL WITH GFR - Hepatic function panel - Magnesium  11. Idiopathic gout, unspecified chronicity, unspecified site  - Uric acid - Colchicine (MITIGARE) 0.6 MG CAPS; Take 1 capsule daily for gout  Dispense: 90 capsule; Refill: 1  12. Gastroesophageal reflux disease  - ranitidine (ZANTAC) 300 MG tablet; Take 1 tablet 2 x d ay to allow taper off Nexium for heartburn  Dispense: 180 tablet; Refill: 1   Plan:   During the course of the visit the patient was educated and counseled about appropriate screening and preventive services including:    Pneumococcal vaccine   Influenza vaccine  Td vaccine  Screening electrocardiogram  Bone densitometry screening  Colorectal cancer screening  Diabetes screening  Glaucoma screening  Nutrition counseling   Advanced directives: requested  Screening recommendations, referrals: Vaccinations: Immunization History  Administered Date(s) Administered  . Influenza, High Dose Seasonal PF 08/13/2014  . Pneumococcal Conjugate-13 08/13/2014  . Pneumococcal Polysaccharide-23 07/12/2012  . Tdap 04/25/2009  . Zoster 07/27/2007  Hep B vaccine not indicated  Nutrition assessed and recommended  Colonoscopy 2008 Dr Sharlett Iles -  10 yr f/u Recommended yearly ophthalmology/optometry visit for glaucoma screening and checkup Recommended yearly dental visit for hygiene and checkup Advanced directives - yes  Conditions/risks identified: BMI: Discussed weight loss, diet, and increase physical activity.  Increase physical activity: AHA recommends 150 minutes of physical activity a week.  Medications reviewed He is screened for PreDiabetes and is at goal, ACE/ARB therapy: Yes. Urinary Incontinence is not an issue: discussed non pharmacology and pharmacology options.  Fall risk: low- discussed PT, home fall assessment, medications.   Subjective:   Curtis Price.  presents for Medicare Annual Wellness Visit and 6 mo OV.  Date of last medicare wellness visit is unknown.This very nice 69 y.o.male presents for  follow up with Hypertension, Hyperlipidemia, Pre-Diabetes Screening , Testosterone  and Vitamin D Deficiency.    Patient is treated for HTN since 1994 & BP has been controlled at home. Today's BP: 116/74 mmHg. Patient has had no complaints of any cardiac type chest pain, palpitations, dyspnea/orthopnea/PND, dizziness, claudication, or dependent edema.   Hyperlipidemia is controlled with diet & meds. Patient denies myalgias or other med SE's. Last Lipids were at goal with  Total Chol 187; HDL 42; LDL  Not calc; with elevated Triglycerides 410 on 08/13/2014.   Also, the patient is screened for PreDiabetes and has had no symptoms of reactive hypoglycemia, diabetic polys, paresthesias or visual blurring.  Last A1c was  5.4% on  08/13/2014.   Further, the patient also has history of Vitamin D Deficiency and supplements vitamin D without any suspected side-effects. Last vitamin D was  39 on 08/13/2014.  Names of Other Physician/Practitioners you currently  use: 1. [facility] here for primary care 2. [place], eye doctor, last visit 2014 3. Dr Vanessa Kick, DDS, dentist, last  visit 4 days ago  Patient Care Team: [name], MD as PCP - General (Internal Medicine) Larey Dresser, MD as Consulting Physician (Cardiology)  Medication Review: Medication Sig  . aspirin (ASPIR-LOW) 81 MG EC tablet Take 81 mg by mouth daily.    . baclofen (LIORESAL) 10 MG tablet   . buPROPion (WELLBUTRIN XL) 300 MG 24 hr tablet Take 1 tablet (300 mg total) by mouth daily. For mood  . esomeprazole (NEXIUM) 40 MG capsule Take 1 capsule (40 mg total) by mouth daily.  . fish oil-omega-3 fatty acids 1000 MG capsule Take 1 capsule by mouth 2 (two) times daily.    . Magnesium 250 MG TABS Take 1 tablet by mouth 2 (two) times daily.    . meloxicam (MOBIC) 15 MG tablet Take 1/2 to 1 tablet daily with food for pain & inflammation  . Multiple Vitamins-Minerals (STRESS B COMPLEX/ZINC) TABS Take 1 tablet by mouth daily.    . sildenafil (REVATIO) 20 MG tablet Take 1 to 5 pills once daily as needed for XXXX  . Testosterone (ANDROGEL) 20.25 MG/1.25GM (1.62%) GEL Place 4 Squirts onto the skin daily. Daily   - disp 6 bottles (90 day supply) with refills for 6 months  . traMADol (ULTRAM) 50 MG tablet 1 tablet 2 x day for pain  . valsartan (DIOVAN) 320 MG tablet Take 1/2 to 1 tablet daily as directed for BP   Current Problems (verified) Patient Active Problem List   Diagnosis Date Noted  . 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 Erythrocytosis 04/28/2014  . Abnormal glucose 01/26/2014  . Medication management 11/19/2013  . BPH (benign prostatic hypertrophy)   . GERD   . Fibromyalgia   . Testosterone Deficiency   . Hyperlipidemia   . Vitamin D deficiency   . Essential hypertension 02/15/2010   Screening Tests Health Maintenance  Topic Date Due  . COLONOSCOPY  03/25/1997  . INFLUENZA VACCINE  04/26/2015  . TETANUS/TDAP  04/26/2019  . PNEUMOCOCCAL POLYSACCHARIDE VACCINE AGE 66 AND OVER  Completed  . ZOSTAVAX  Completed   Immunization History  Administered Date(s)  Administered  . Influenza, High Dose Seasonal PF 08/13/2014  . Pneumococcal Conjugate-13 08/13/2014  . Pneumococcal Polysaccharide-23 07/12/2012  . Tdap 04/25/2009  . Zoster 07/27/2007   Preventative care: Last colonoscopy: 2008 - f/u due 10 yr - Dr Sharlett Iles**  History reviewed: allergies, current medications, past family history, past medical history, past social history, past surgical history and problem list  Risk Factors: Tobacco History  Substance Use Topics  . Smoking status: Never Smoker   . Smokeless tobacco: Never Used     Comment: Rare cigars  . Alcohol Use: Yes     Comment: Occasional social drinker;cocktail, beer, or mixed drink daily   He does not smoke.  Patient is not a former smoker. Are there smokers in your home (other than you)?  No  Alcohol Current alcohol use: 2 beers per day(s)  Caffeine Current caffeine use: coffee 0-1 cup /day  Exercise Current exercise: PushUps & SitUps  Nutrition/Diet Current diet: in general, a "healthy" diet    Cardiac risk factors: advanced age (older than 54 for men, 78 for women), dyslipidemia, hypertension, male gender and obesity (BMI >= 30 kg/m2).  Depression Screen (Note: if answer to either of the following is "Yes", a  more complete depression screening is indicated)   Q1: Over the past two weeks, have you felt down, depressed or hopeless? No  Q2: Over the past two weeks, have you felt little interest or pleasure in doing things? No  Have you lost interest or pleasure in daily life? No  Do you often feel hopeless? No  Do you cry easily over simple problems? No  Activities of Daily Living In your present state of health, do you have any difficulty performing the following activities?:  Driving? No Managing money?  No Feeding yourself? No Getting from bed to chair? No Climbing a flight of stairs? No Preparing food and eating?: No Bathing or showering? No Getting dressed: No Getting to the toilet? No Using  the toilet:No Moving around from place to place: No In the past year have you fallen or had a near fall?:No   Are you sexually active?  Yes  Do you have more than one partner?  No  Vision Difficulties: No  Hearing Difficulties: No Do you often ask people to speak up or repeat themselves? No Do you experience ringing or noises in your ears? No Do you have difficulty understanding soft or whispered voices? No  Cognition  Do you feel that you have a problem with memory?No  Do you often misplace items? No  Do you feel safe at home?  Yes  Advanced directives Does patient have a [place]? Yes Does patient have a Living Will? Yes  Past Medical History  Diagnosis Date  . BPH (benign prostatic hypertrophy)   . GERD (gastroesophageal reflux disease)   . Fibromyalgia   . Hypogonadism male   . History of epididymitis   . Myofascial pain syndrome   . Nephrolithiasis   . Prostatitis     Recurrent  . Prolapsed internal hemorrhoids   . History of head injury without skull fracture 1980    Concussion  . Tendinitis 2000    Hx of left bicep  . Rheumatic fever     x3 at ages [age] and 5; No significant valvular abnormality on 5/11 echo  . Exertional dyspnea     ETT myoview 5/11: 8 min, no ischemic ECG changes, stopped due to fatigue/ SOB. EF 60%, no ischemia or infarction. Echo 5/11 EF 60%, no significant valvular abnormalities, mild RV dilation and dysfunction, no complete TR doppler jet so PA pressure hard to gauge. 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 R heart cath 6/11: mean RA 10 mmHg, PA 25/18, mean PCWP 13 mmHg, CI 2.1  . Hypertension   . Hyperlipidemia     Myalgias with Lipitor  . Vitamin D deficiency   . 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 Erythrocytosis 04/28/2014    Past Surgical History  Procedure Laterality Date  . Cardiac catheterization  02/2010    Left and right  . Heel spur surgery  1960     Bilateral  . Tonsillectomy  1962  . Finger surgery  2000    Repair of injury to right fifth finger  . Bih repair  08/2005    Incarverated fat by mesh repair    ROS: Constitutional: Denies fever, chills, weight loss/gain, headaches, insomnia, fatigue, night sweats or change in appetite. Eyes: Denies redness, blurred vision, diplopia, discharge, itchy or watery eyes.  ENT: Denies discharge, congestion, post nasal drip, epistaxis, sore throat, earache, hearing loss, dental pain, Tinnitus, Vertigo, Sinus pain or snoring.  Cardio: Denies chest pain,  palpitations, irregular heartbeat, syncope, dyspnea, diaphoresis, orthopnea, PND, claudication or edema Respiratory: denies cough, dyspnea, DOE, pleurisy, hoarseness, laryngitis or wheezing.  Gastrointestinal: Denies dysphagia, heartburn, reflux, water brash, pain, cramps, nausea, vomiting, bloating, diarrhea, constipation, hematemesis, melena, hematochezia, jaundice or hemorrhoids Genitourinary: Denies dysuria, frequency, urgency, nocturia, hesitancy, discharge, hematuria or flank pain Musculoskeletal: Denies arthralgia, myalgia, stiffness, Jt. Swelling, pain, limp or strain/sprain. Denies Falls. Skin: Denies puritis, rash, hives, warts, acne, eczema or change in skin lesion Neuro: No weakness, tremor, incoordination, spasms, paresthesia or pain Psychiatric: Denies confusion, memory loss or sensory loss. Denies Depression. Endocrine: Denies change in weight, skin, hair change, nocturia, and paresthesia, diabetic polys, visual blurring or hyper / hypo glycemic episodes.  Heme/Lymph: No excessive bleeding, bruising or enlarged lymph nodes.  Objective:     BP 116/74   Pulse 72  Temp 98.1 F   Resp 16  Ht 5' 10.75"   Wt 211 lb    BMI 29.67   General Appearance:  Alert  WD/WN, male , in no apparent distress. Eyes: PERRLA, EOMs, conjunctiva no swelling or erythema, normal fundi and vessels. Sinuses: No frontal/maxillary tenderness ENT/Mouth:  EACs patent / TMs  nl. Nares clear without erythema, swelling, mucoid exudates. Oral hygiene is good. No erythema, swelling, or exudate. Tongue normal, non-obstructing. Tonsils not swollen or erythematous. Hearing normal.  Neck: Supple, thyroid normal. No bruits, nodes or JVD. Respiratory: Respiratory effort normal.  BS equal and clear bilateral without rales, rhonci, wheezing or stridor. Cardio: Heart sounds are normal with regular rate and rhythm and no murmurs, rubs or gallops. Peripheral pulses are normal and equal bilaterally without edema. No aortic or femoral bruits. Chest: symmetric with normal excursions and percussion.  Abdomen: Flat, soft, with nl bowel sounds. Nontender, benign. Lymphatics: Non tender without lymphadenopathy.   Musculoskeletal: Full ROM all peripheral extremities, joint stability, 5/5 strength, and normal gait. Skin: Warm and dry without rashes, lesions, cyanosis, clubbing or  ecchymosis.  Neuro: Cranial nerves intact, reflexes equal bilaterally. Normal muscle tone, no cerebellar symptoms. Sensation intact.  Pysch: Awake and oriented X 3 with normal affect, insight and judgment appropriate.   Cognitive Testing  Alert? Yes  Normal Appearance? Yes  Oriented to person? Yes  Place? Yes   Time? Yes  Recall of three objects?  Yes  Can perform simple calculations? Yes  Displays appropriate judgment? Yes  Can read the correct time from a watch/clock? Yes  Medicare Attestation I have personally reviewed: The patient's medical and social history Their use of alcohol, tobacco or illicit drugs Their current medications and supplements The patient's functional ability including ADLs,fall risks, home safety risks, cognitive, and hearing and visual impairment Diet and physical activities Evidence for depression or mood disorders  The patient's weight, height, BMI, and visual acuity have been recorded in the chart.  I have made referrals, counseling, and provided education  to the patient based on review of the above and I have provided the patient with a written personalized care plan for preventive services.    Chanah Tidmore DAVID, MD   11/23/2014

## 2014-11-23 ENCOUNTER — Encounter: Payer: Self-pay | Admitting: Internal Medicine

## 2014-11-23 ENCOUNTER — Ambulatory Visit (INDEPENDENT_AMBULATORY_CARE_PROVIDER_SITE_OTHER): Payer: Medicare Other | Admitting: Internal Medicine

## 2014-11-23 VITALS — BP 116/74 | HR 72 | Temp 98.1°F | Resp 16 | Ht 70.75 in | Wt 211.2 lb

## 2014-11-23 DIAGNOSIS — F329 Major depressive disorder, single episode, unspecified: Secondary | ICD-10-CM

## 2014-11-23 DIAGNOSIS — E291 Testicular hypofunction: Secondary | ICD-10-CM

## 2014-11-23 DIAGNOSIS — M1000 Idiopathic gout, unspecified site: Secondary | ICD-10-CM

## 2014-11-23 DIAGNOSIS — K219 Gastro-esophageal reflux disease without esophagitis: Secondary | ICD-10-CM

## 2014-11-23 DIAGNOSIS — F32A Depression, unspecified: Secondary | ICD-10-CM

## 2014-11-23 DIAGNOSIS — E559 Vitamin D deficiency, unspecified: Secondary | ICD-10-CM

## 2014-11-23 DIAGNOSIS — Z9181 History of falling: Secondary | ICD-10-CM

## 2014-11-23 DIAGNOSIS — R7309 Other abnormal glucose: Secondary | ICD-10-CM

## 2014-11-23 DIAGNOSIS — E785 Hyperlipidemia, unspecified: Secondary | ICD-10-CM

## 2014-11-23 DIAGNOSIS — Z79899 Other long term (current) drug therapy: Secondary | ICD-10-CM

## 2014-11-23 DIAGNOSIS — Z1331 Encounter for screening for depression: Secondary | ICD-10-CM

## 2014-11-23 DIAGNOSIS — I10 Essential (primary) hypertension: Secondary | ICD-10-CM

## 2014-11-23 DIAGNOSIS — Z789 Other specified health status: Secondary | ICD-10-CM

## 2014-11-23 DIAGNOSIS — M797 Fibromyalgia: Secondary | ICD-10-CM

## 2014-11-23 LAB — CBC WITH DIFFERENTIAL/PLATELET
Basophils Absolute: 0.1 10*3/uL (ref 0.0–0.1)
Basophils Relative: 1 % (ref 0–1)
Eosinophils Absolute: 0.2 10*3/uL (ref 0.0–0.7)
Eosinophils Relative: 4 % (ref 0–5)
HCT: 50.3 % (ref 39.0–52.0)
Hemoglobin: 18 g/dL — ABNORMAL HIGH (ref 13.0–17.0)
Lymphocytes Relative: 21 % (ref 12–46)
Lymphs Abs: 1.3 10*3/uL (ref 0.7–4.0)
MCH: 32.8 pg (ref 26.0–34.0)
MCHC: 35.8 g/dL (ref 30.0–36.0)
MCV: 91.8 fL (ref 78.0–100.0)
MPV: 10.9 fL (ref 8.6–12.4)
Monocytes Absolute: 0.9 10*3/uL (ref 0.1–1.0)
Monocytes Relative: 14 % — ABNORMAL HIGH (ref 3–12)
Neutro Abs: 3.7 10*3/uL (ref 1.7–7.7)
Neutrophils Relative %: 60 % (ref 43–77)
Platelets: 180 10*3/uL (ref 150–400)
RBC: 5.48 MIL/uL (ref 4.22–5.81)
RDW: 13.2 % (ref 11.5–15.5)
WBC: 6.1 10*3/uL (ref 4.0–10.5)

## 2014-11-23 LAB — HEMOGLOBIN A1C
Hgb A1c MFr Bld: 5.5 % (ref <5.7)
Mean Plasma Glucose: 111 mg/dL (ref <117)

## 2014-11-23 MED ORDER — RANITIDINE HCL 300 MG PO TABS: ORAL_TABLET | ORAL | Status: DC

## 2014-11-23 MED ORDER — COLCHICINE 0.6 MG PO CAPS: ORAL_CAPSULE | ORAL | Status: AC

## 2014-11-24 LAB — BASIC METABOLIC PANEL WITH GFR
BUN: 17 mg/dL (ref 6–23)
CO2: 25 mEq/L (ref 19–32)
Calcium: 9.3 mg/dL (ref 8.4–10.5)
Chloride: 104 mEq/L (ref 96–112)
Creat: 1.06 mg/dL (ref 0.50–1.35)
GFR, Est African American: 84 mL/min
GFR, Est Non African American: 72 mL/min
Glucose, Bld: 103 mg/dL — ABNORMAL HIGH (ref 70–99)
Potassium: 4.5 mEq/L (ref 3.5–5.3)
Sodium: 139 mEq/L (ref 135–145)

## 2014-11-24 LAB — MAGNESIUM: Magnesium: 1.8 mg/dL (ref 1.5–2.5)

## 2014-11-24 LAB — HEPATIC FUNCTION PANEL
ALT: 54 U/L — ABNORMAL HIGH (ref 0–53)
AST: 29 U/L (ref 0–37)
Albumin: 4.1 g/dL (ref 3.5–5.2)
Alkaline Phosphatase: 56 U/L (ref 39–117)
Bilirubin, Direct: 0.1 mg/dL (ref 0.0–0.3)
Indirect Bilirubin: 0.5 mg/dL (ref 0.2–1.2)
Total Bilirubin: 0.6 mg/dL (ref 0.2–1.2)
Total Protein: 5.8 g/dL — ABNORMAL LOW (ref 6.0–8.3)

## 2014-11-24 LAB — TSH: TSH: 2.874 u[IU]/mL (ref 0.350–4.500)

## 2014-11-24 LAB — LIPID PANEL
Cholesterol: 160 mg/dL (ref 0–200)
HDL: 51 mg/dL (ref >=40)
LDL Cholesterol: 85 mg/dL (ref 0–99)
Total CHOL/HDL Ratio: 3.1 Ratio
Triglycerides: 120 mg/dL (ref <150)
VLDL: 24 mg/dL (ref 0–40)

## 2014-11-24 LAB — URIC ACID: Uric Acid, Serum: 6.3 mg/dL (ref 4.0–7.8)

## 2014-11-24 LAB — INSULIN, FASTING: Insulin fasting, serum: 10.2 u[IU]/mL (ref 2.0–19.6)

## 2014-11-24 LAB — TESTOSTERONE: Testosterone: 207 ng/dL — ABNORMAL LOW (ref 300–890)

## 2014-11-24 LAB — VITAMIN D 25 HYDROXY (VIT D DEFICIENCY, FRACTURES): Vit D, 25-Hydroxy: 62 ng/mL (ref 30–100)

## 2014-12-17 ENCOUNTER — Other Ambulatory Visit: Payer: Self-pay | Admitting: Internal Medicine

## 2015-01-11 ENCOUNTER — Other Ambulatory Visit: Payer: Self-pay | Admitting: Internal Medicine

## 2015-01-28 ENCOUNTER — Other Ambulatory Visit: Payer: Self-pay | Admitting: Internal Medicine

## 2015-02-12 ENCOUNTER — Encounter: Payer: Self-pay | Admitting: Internal Medicine

## 2015-02-12 ENCOUNTER — Ambulatory Visit (INDEPENDENT_AMBULATORY_CARE_PROVIDER_SITE_OTHER): Payer: Medicare Other | Admitting: Internal Medicine

## 2015-02-12 VITALS — BP 150/90 | HR 64 | Temp 97.7°F | Resp 16 | Ht 70.75 in | Wt 207.4 lb

## 2015-02-12 DIAGNOSIS — J0410 Acute tracheitis without obstruction: Secondary | ICD-10-CM

## 2015-02-12 DIAGNOSIS — J029 Acute pharyngitis, unspecified: Secondary | ICD-10-CM

## 2015-02-12 DIAGNOSIS — K219 Gastro-esophageal reflux disease without esophagitis: Secondary | ICD-10-CM

## 2015-02-12 MED ORDER — AZITHROMYCIN 250 MG PO TABS: ORAL_TABLET | ORAL | Status: DC

## 2015-02-12 MED ORDER — PHENYLEPH-PROMETHAZINE-COD 5-6.25-10 MG/5ML PO SYRP: ORAL_SOLUTION | ORAL | Status: AC

## 2015-02-12 MED ORDER — PREDNISONE 20 MG PO TABS: ORAL_TABLET | ORAL | Status: DC

## 2015-02-13 NOTE — Progress Notes (Signed)
Subjective:    Patient ID: Curtis Price., male    DOB: 1946/08/30, 69 y.o.   MRN: [id number]  HPI Patient presents w/3-4 days hx/o ST and dry minimally productive cough. Also, c/o "feels like right side of throat swollen & painful". In addition , reports recent HB & reflux. NO rashes, fevers, chills.  Medication Sig  . ANDROGEL PUMP 20.25 MG/ACT (1.62%) GEL   . aspirin (ASPIR-LOW) 81 MG EC tablet Take 81 mg by mouth daily.    . baclofen (LIORESAL) 10 MG tablet   . buPROPion (WELLBUTRIN XL) 300 MG 24 hr tablet TAKE 1 TABLET DAILY FOR MOOD  . Colchicine (MITIGARE) 0.6 MG CAPS Take 1 capsule daily for gout  . fish oil-omega-3 fatty acids 1000 MG capsule Take 1 capsule by mouth 2 (two) times daily.    . Magnesium 250 MG TABS Take 1 tablet by mouth 2 (two) times daily.    . Multiple Vitamins-Minerals (STRESS B COMPLEX/ZINC) TABS Take 1 tablet by mouth daily.    . pantoprazole (PROTONIX) 40 MG tablet   . ranitidine (ZANTAC) 300 MG tablet Take 1 tablet 2 x d ay to allow taper off Nexium for heartburn  . sildenafil (REVATIO) 20 MG tablet Take 1 to 5 pills once daily as needed for XXXX  . Testosterone (ANDROGEL) 20.25 MG/1.25GM (1.62%) GEL Place 4 Squirts onto the skin daily. Daily   - disp 6 bottles (90 day supply) with refills for 6 months  . traMADol (ULTRAM) 50 MG tablet 1 tablet 2 x day for pain  . valsartan (DIOVAN) 320 MG tablet Take 1/2 to 1 tablet daily as directed for BP  . esomeprazole (NEXIUM) 40 MG capsule TAKE 1 CAPSULE (40 MG TOTAL) BY MOUTH DAILY.  . meloxicam (MOBIC) 15 MG tablet Take 1/2 to 1 tablet daily with food for pain & inflammation   Allergies  Allergen Reactions  . Atenolol   . Atorvastatin   . Darvocet [Propoxyphene N-Acetaminophen]   . Paroxetine   . Pregabalin   . Propoxyphene Hcl    Past Medical History  Diagnosis Date  . BPH (benign prostatic hypertrophy)   . GERD (gastroesophageal reflux disease)   . Fibromyalgia   . Hypogonadism male   . History  of epididymitis   . Myofascial pain syndrome   . Nephrolithiasis   . Prostatitis     Recurrent  . Prolapsed internal hemorrhoids   . History of head injury without skull fracture 1980    Concussion  . Tendinitis 2000    Hx of left bicep  . Rheumatic fever     x3 at ages [age] and 30; No significant valvular abnormality on 5/11 echo  . Exertional dyspnea     ETT myoview 5/11: 8 min, no ischemic ECG changes, stopped due to fatigue/ SOB. EF 60%, no ischemia or infarction. Echo 5/11 EF 60%, no significant valvular abnormalities, mild RV dilation and dysfunction, no complete TR doppler jet so PA pressure hard to gauge. 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 R heart cath 6/11: mean RA 10 mmHg, PA 25/18, mean PCWP 13 mmHg, CI 2.1  . Hypertension   . Hyperlipidemia     Myalgias with Lipitor  . Vitamin D deficiency   . 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 Erythrocytosis 04/28/2014   Past Surgical History  Procedure Laterality Date  . Cardiac catheterization  02/2010    Left and right  . Heel spur surgery  1960    Bilateral  . Tonsillectomy  1962  . Finger surgery  2000    Repair of injury to right fifth finger  . Bih repair  08/2005    Incarverated fat by mesh repair   Review of Systems 10 point systems review negative except as above.    Objective:   Physical Exam  BP 150/90 mmHg  Pulse 64  Temp(Src) 97.7 F (36.5 C)  Resp 16  Ht 5' 10.75" (1.797 m)  Wt 207 lb 6.4 oz (94.076 kg)  BMI 29.13 kg/m2   Skin - clear w/o exposed w/o cyanosis rashes. In No distress.  HEENT - Eac's patent. TM's Nl. EOM's full. PERRLA. NasoOroPharynx 2+ injected w/o exudates. Tonsils atrophic. Neck - supple. Nl Thyroid. Carotids 2+ & No bruits, nodes, JVD Chest -with scattered dry rales and no rhonchi, wheezes. Cor - Nl HS. RRR w/o sig MGR. PP 1(+). No edema. Abd - No palpable organomegaly, masses or tenderness. BS nl. MS- FROM w/o deformities.  Muscle power, tone and bulk Nl. Gait Nl. Neuro - No obvious Cr N abnormalities. Sensory, motor and Cerebellar functions appear Nl w/o focal abnormalities. Psyche - Mental status normal & appropriate.  No delusions, ideations or obvious mood abnormalities.    Assessment & Plan:   1. Acute pharyngitis, unspecified pharyngitis type  - predniSONE (DELTASONE) 20 MG tablet; 1 tab 3 x day for 3 days, then 1 tab 2 x day for 3 days, then 1 tab 1 x day for 5 days  Dispense: 20 tablet; Refill: 0 - azithromycin (ZITHROMAX) 250 MG tablet; Take 2 tablets (500 mg) on  Day 1,  followed by 1 tablet (250 mg) once daily on Days 2 through 5.  Dispense: 6 each; Refill: 1  2. Tracheitis  - predniSONE (DELTASONE) 20 MG tablet; 1 tab 3 x day for 3 days, then 1 tab 2 x day for 3 days, then 1 tab 1 x day for 5 days  Dispense: 20 tablet; Refill: 0 - Phenyleph-Promethazine-Cod 5-6.25-10 MG/5ML SYRP; 1 to 2 tsp 4 x da or every 4 hours as needed for cough  Dispense: 240 mL; Refill: 1  3. Gastroesophageal reflux disease, esophagitis presence not specified

## 2015-02-20 ENCOUNTER — Encounter: Payer: Self-pay | Admitting: Internal Medicine

## 2015-02-20 NOTE — Patient Instructions (Addendum)
Mucus Relief 400 mg x 2 tablets 3 x day with meals   ( = 6 tablets / daily)  ++++++++++++++++++++++++++++++++  Recommend Adult Low dose Aspirin or baby Aspirin 81 mg daily   To reduce risk of Colon Cancer 20 %,   Skin Cancer 26 % ,   Melanoma 46%   and   Pancreatic cancer 60%  ++++++++++++++++++  Vitamin D goal is between 70-100.   Please make sure that you are taking your Vitamin D as directed.   It is very important as a natural anti-inflammatory   helping hair, skin, and nails, as well as reducing stroke and heart attack risk.   It helps your bones and helps with mood.  It also decreases numerous cancer risks so please take it as directed.   Low Vit D is associated with a 200-300% higher risk for CANCER   and 200-300% higher risk for HEART   ATTACK  &  STROKE.    .....................................Marland Kitchen  It is also associated with higher death rate at younger ages,   autoimmune diseases like Rheumatoid arthritis, Lupus, Multiple Sclerosis.     Also many other serious conditions, like depression, Alzheimer's  Dementia, infertility, muscle aches, fatigue, fibromyalgia - just to name a few.  +++++++++++++++++++    Recommend the book "The END of DIETING" by Dr Excell Seltzer   & the book "The END of DIABETES " by Dr Excell Seltzer  At [email or web address] - get book & Audio CD's     Being diabetic has a  300% increased risk for heart attack, stroke, cancer, and alzheimer- type vascular dementia. It is very important that you work harder with diet by avoiding all foods that are white. Avoid white rice (brown & wild rice is OK), white potatoes (sweetpotatoes in moderation is OK), White bread or wheat bread or anything made out of white flour like bagels, donuts, rolls, buns, biscuits, cakes, pastries, cookies, pizza crust, and pasta (made from white flour & egg whites) - vegetarian pasta or spinach or wheat pasta is OK. Multigrain breads like Arnold's or [employer], or  multigrain sandwich thins or flatbreads.  Diet, exercise and weight loss can reverse and cure diabetes in the early stages.  Diet, exercise and weight loss is very important in the control and prevention of complications of diabetes which affects every system in your body, ie. Brain - dementia/stroke, eyes - glaucoma/blindness, heart - heart attack/heart failure, kidneys - dialysis, stomach - gastric paralysis, intestines - malabsorption, nerves - severe painful neuritis, circulation - gangrene & loss of a leg(s), and finally cancer and Alzheimers.    I recommend avoid fried & greasy foods,  sweets/candy, white rice (brown or wild rice or Quinoa is OK), white potatoes (sweet potatoes are OK) - anything made from white flour - bagels, doughnuts, rolls, buns, biscuits,white and wheat breads, pizza crust and traditional pasta made of white flour & egg white(vegetarian pasta or spinach or wheat pasta is OK).  Multi-grain bread is OK - like multi-grain flat bread or sandwich thins. Avoid alcohol in excess. Exercise is also important.    Eat all the vegetables you want - avoid meat, especially red meat and dairy - especially cheese.  Cheese is the most concentrated form of trans-fats which is the worst thing to clog up our arteries. Veggie cheese is OK which can be found in the fresh produce section at [facility] or [employer] or [employer]  ++++++++++++++++++++++++++   Preventive Care for Adults  A healthy lifestyle  and preventive care can promote health and wellness. Preventive health guidelines for men include the following key practices:  A routine yearly physical is a good way to check with your health care provider about your health and preventative screening. It is a chance to share any concerns and updates on your health and to receive a thorough exam.  Visit your dentist for a routine exam and preventative care every 6 months. Brush your teeth twice a day and floss once a day. Good oral hygiene  prevents tooth decay and gum disease.  The frequency of eye exams is based on your age, health, family medical history, use of contact lenses, and other factors. Follow your health care provider's recommendations for frequency of eye exams.  Eat a healthy diet. Foods such as vegetables, fruits, whole grains, low-fat dairy products, and lean protein foods contain the nutrients you need without too many calories. Decrease your intake of foods high in solid fats, added sugars, and salt. Eat the right amount of calories for you.Get information about a proper diet from your health care provider, if necessary.  Regular physical exercise is one of the most important things you can do for your health. Most adults should get at least 150 minutes of moderate-intensity exercise (any activity that increases your heart rate and causes you to sweat) each week. In addition, most adults need muscle-strengthening exercises on 2 or more days a week.  Maintain a healthy weight. The body mass index (BMI) is a screening tool to identify possible weight problems. It provides an estimate of body fat based on height and weight. Your health care provider can find your BMI and can help you achieve or maintain a healthy weight.For adults 20 years and older:  A BMI below 18.5 is considered underweight.  A BMI of 18.5 to 24.9 is normal.  A BMI of 25 to 29.9 is considered overweight.  A BMI of 30 and above is considered obese.  Maintain normal blood lipids and cholesterol levels by exercising and minimizing your intake of saturated fat. Eat a balanced diet with plenty of fruit and vegetables. Blood tests for lipids and cholesterol should begin at age 23 and be repeated every 5 years. If your lipid or cholesterol levels are high, you are over 50, or you are at high risk for heart disease, you may need your cholesterol levels checked more frequently.Ongoing high lipid and cholesterol levels should be treated with medicines if  diet and exercise are not working.  If you smoke, find out from your health care provider how to quit. If you do not use tobacco, do not start.  Lung cancer screening is recommended for adults aged 6-80 years who are at high risk for developing lung cancer because of a history of smoking. A yearly low-dose CT scan of the lungs is recommended for people who have at least a 30-pack-year history of smoking and are a current smoker or have quit within the past 15 years. A pack year of smoking is smoking an average of 1 pack of cigarettes a day for 1 year (for example: 1 pack a day for 30 years or 2 packs a day for 15 years). Yearly screening should continue until the smoker has stopped smoking for at least 15 years. Yearly screening should be stopped for people who develop a health problem that would prevent them from having lung cancer treatment.  If you choose to drink alcohol, do not have more than 2 drinks per day. One drink  is considered to be 12 ounces (355 mL) of beer, 5 ounces (148 mL) of wine, or 1.5 ounces (44 mL) of liquor.  Avoid use of street drugs. Do not share needles with anyone. Ask for help if you need support or instructions about stopping the use of drugs.  High blood pressure causes heart disease and increases the risk of stroke. Your blood pressure should be checked at least every 1-2 years. Ongoing high blood pressure should be treated with medicines, if weight loss and exercise are not effective.  If you are 53-33 years old, ask your health care provider if you should take aspirin to prevent heart disease.  Diabetes screening involves taking a blood sample to check your fasting blood sugar level. Testing should be considered at a younger age or be carried out more frequently if you are overweight and have at least 1 risk factor for diabetes.  Colorectal cancer can be detected and often prevented. Most routine colorectal cancer screening begins at the age of 62 and continues  through age 30. However, your health care provider may recommend screening at an earlier age if you have risk factors for colon cancer. On a yearly basis, your health care provider may provide home test kits to check for hidden blood in the stool. Use of a small camera at the end of a tube to directly examine the colon (sigmoidoscopy or colonoscopy) can detect the earliest forms of colorectal cancer. Talk to your health care provider about this at age 67, when routine screening begins. Direct exam of the colon should be repeated every 5-10 years through age 33, unless early forms of precancerous polyps or small growths are found.  Hepatitis C blood testing is recommended for all people born from [birth year] through 1964 and any individual with known risks for hepatitis C.  Screening for abdominal aortic aneurysm (AAA)  by ultrasound is recommended for people who have history of high blood pressure or who are current or former smokers.  Healthy men should  receive prostate-specific antigen (PSA) blood tests as part of routine cancer screening. Talk with your health care provider about prostate cancer screening.  Testicular cancer screening is  recommended for adult males. Screening includes self-exam, a health care provider exam, and other screening tests. Consult with your health care provider about any symptoms you have or any concerns you have about testicular cancer.  Use sunscreen. Apply sunscreen liberally and repeatedly throughout the day. You should seek shade when your shadow is shorter than you. Protect yourself by wearing long sleeves, pants, a wide-brimmed hat, and sunglasses year round, whenever you are outdoors.  Once a month, do a whole-body skin exam, using a mirror to look at the skin on your back. Tell your health care provider about new moles, moles that have irregular borders, moles that are larger than a pencil eraser, or moles that have changed in shape or color.  Stay current with  required vaccines (immunizations).  Influenza vaccine. All adults should be immunized every year.  Tetanus, diphtheria, and acellular pertussis (Td, Tdap) vaccine. An adult who has not previously received Tdap or who does not know his vaccine status should receive 1 dose of Tdap. This initial dose should be followed by tetanus and diphtheria toxoids (Td) booster doses every 10 years. Adults with an unknown or incomplete history of completing a 3-dose immunization series with Td-containing vaccines should begin or complete a primary immunization series including a Tdap dose. Adults should receive a Td booster every 10  years.  Zoster vaccine. One dose is recommended for adults aged 82 years or older unless certain conditions are present.    PREVNAR - Pneumococcal 13-valent conjugate (PCV13) vaccine. When indicated, a person who is uncertain of his immunization history and has no record of immunization should receive the PCV13 vaccine. An adult aged 4 years or older who has certain medical conditions and has not been previously immunized should receive 1 dose of PCV13 vaccine. This PCV13 should be followed with a dose of pneumococcal polysaccharide (PPSV23) vaccine. The PPSV23 vaccine dose should be obtained at least 8 weeks after the dose of PCV13 vaccine. An adult aged 42 years or older who has certain medical conditions and previously received 1 or more doses of PPSV23 vaccine should receive 1 dose of PCV13. The PCV13 vaccine dose should be obtained 1 or more years after the last PPSV23 vaccine dose.    PNEUMOVAX - Pneumococcal polysaccharide (PPSV23) vaccine. When PCV13 is also indicated, PCV13 should be obtained first. All adults aged 49 years and older should be immunized. An adult younger than age 57 years who has certain medical conditions should be immunized. Any person who resides in a nursing home or long-term care facility should be immunized. An adult smoker should be immunized. People with  an immunocompromised condition and certain other conditions should receive both PCV13 and PPSV23 vaccines. People with human immunodeficiency virus (HIV) infection should be immunized as soon as possible after diagnosis. Immunization during chemotherapy or radiation therapy should be avoided. Routine use of PPSV23 vaccine is not recommended for American Indians, [place] Natives, or people younger than 65 years unless there are medical conditions that require PPSV23 vaccine. When indicated, people who have unknown immunization and have no record of immunization should receive PPSV23 vaccine. One-time revaccination 5 years after the first dose of PPSV23 is recommended for people aged 19-64 years who have chronic kidney failure, nephrotic syndrome, asplenia, or immunocompromised conditions. People who received 1-2 doses of PPSV23 before age 45 years should receive another dose of PPSV23 vaccine at age 66 years or later if at least 5 years have passed since the previous dose. Doses of PPSV23 are not needed for people immunized with PPSV23 at or after age 7 years.    Hepatitis A vaccine. Adults who wish to be protected from this disease, have certain high-risk conditions, work with hepatitis A-infected animals, work in hepatitis A research labs, or travel to or work in countries with a high rate of hepatitis A should be immunized. Adults who were previously unvaccinated and who anticipate close contact with an international adoptee during the first 60 days after arrival in the [place] from a country with a high rate of hepatitis A should be immunized.    Hepatitis B vaccine. Adults should be immunized if they wish to be protected from this disease, have certain high-risk conditions, may be exposed to blood or other infectious body fluids, are household contacts or sex partners of hepatitis B positive people, are clients or workers in certain care facilities, or travel to or work in countries with a high  rate of hepatitis B.   Preventive Service / Frequency   Ages 27 and over  Blood pressure check.  Lipid and cholesterol check.  Lung cancer screening. / Every year if you are aged 46-80 years and have a 30-pack-year history of smoking and currently smoke or have quit within the past 15 years. Yearly screening is stopped once you have quit smoking for at least  15 years or develop a health problem that would prevent you from having lung cancer treatment.  Fecal occult blood test (FOBT) of stool. You may not have to do this test if you get a colonoscopy every 10 years.  Flexible sigmoidoscopy** or colonoscopy.** / Every 5 years for a flexible sigmoidoscopy or every 10 years for a colonoscopy beginning at age 67 and continuing until age 74.  Hepatitis C blood test.** / For all people born from [birth year] through 1964 and any individual with known risks for hepatitis C.  Abdominal aortic aneurysm (AAA) screening./ Screening current or former smokers or have Hypertension.  Skin self-exam. / Monthly.  Influenza vaccine. / Every year.  Tetanus, diphtheria, and acellular pertussis (Tdap/Td) vaccine.** / 1 dose of Td every 10 years.   Zoster vaccine.** / 1 dose for adults aged 33 years or older.         Pneumococcal 13-valent conjugate (PCV13) vaccine.    Pneumococcal polysaccharide (PPSV23) vaccine.     Hepatitis A vaccine.** / Consult your health care provider.  Hepatitis B vaccine.** / Consult your health care provider. Screening for abdominal aortic aneurysm (AAA)  by ultrasound is recommended for people who have history of high blood pressure or who are current or former smokers.

## 2015-02-20 NOTE — Progress Notes (Signed)
Patient ID: Curtis Price., male   DOB: 1946/03/01, 69 y.o.   MRN: [id number]  MEDICARE ANNUAL WELLNESS VISIT AND OV  Assessment:   1. Essential hypertension  - TSH  2. Prediabetes  - Hemoglobin A1c - Insulin, random  3. Hyperlipidemia  - Lipid panel  4. Gastroesophageal reflux disease, esophagitis presence not specified  - Vit D  25 hydroxy (rtn osteoporosis monitoring)  5. Testosterone Deficiency  - Testosterone  6. Medication management  - CBC with Differential/Platelet - BASIC METABOLIC PANEL WITH GFR - Hepatic function panel - Magnesium  7. Pharyngitis & Sinusitis  - Rx: Levaquin 500 mg #15 x 1 fr - Rx: Prednisone 20 mg #20 - pulse /taper   Plan:   During the course of the visit the patient was educated and counseled about appropriate screening and preventive services including:    Pneumococcal vaccine   Influenza vaccine  Td vaccine  Screening electrocardiogram  Bone densitometry screening  Colorectal cancer screening  Diabetes screening  Glaucoma screening  Nutrition counseling   Advanced directives: requested  Screening recommendations, referrals: Vaccinations: Immunization History  Administered Date(s) Administered  . Influenza, High Dose Seasonal PF 08/13/2014  . Pneumococcal Conjugate-13 08/13/2014  . Pneumococcal Polysaccharide-23 07/12/2012  . Tdap 04/25/2009  . Zoster 07/27/2007  Hep B vaccine not indicated  Nutrition assessed and recommended  Colonoscopy 2005 A reports he ignored recall letter in 2015, but is agreeable to to a Cologard Recommended yearly ophthalmology/optometry visit for glaucoma screening and checkup Recommended yearly dental visit for hygiene and checkup Advanced directives - yes  Conditions/risks identified: BMI: Discussed weight loss, diet, and increase physical activity.  Increase physical activity: AHA recommends 150 minutes of physical activity a week.  Medications reviewed PreDiabetes is at  goal, ACE/ARB therapy: Yes. Urinary Incontinence is not an issue: discussed non pharmacology and pharmacology options.  Fall risk: low- discussed PT, home fall assessment, medications.   Subjective:    Curtis Price.  presents for [facility] Annual Wellness Visit and OV.  Date of last medicare wellness visit was 08/13/2014. This very nice 69 y.o. MWM also presents for 3 month follow up with Hypertension, Hyperlipidemia, Pre-Diabetes and Vitamin D Deficiency. Patient was treated for a URI/pharyngitis several weeks ago with sx's initially improving then relapsing with persisting ST, sinus drainage , cough and right ear discomfort.    Patient is treated for HTN since 1994 & BP has been controlled at home. Today's BP is 122/80. Patient had a negative Myoview & Normal Heart Cath in 2011. Patient has had no complaints of any cardiac type chest pain, palpitations, dyspnea/orthopnea/PND, dizziness, claudication, or dependent edema.   Hyperlipidemia is controlled with diet & meds. Patient denies myalgias or other med SE's. Last Lipids were at goal -Total Chol 160; HDL 51; LDL 85; Trig 120 on  11/23/2014.   Also, the patient is screened for PreDiabetes and has had no symptoms of reactive hypoglycemia, diabetic polys, paresthesias or visual blurring.  Last A1c was 5.5% on 11/23/2014.    Patient has hx/o GERD and has been off of Pantoprazole and on Ranitidine with worsening sx's and requests to restart pantoprazole because of persistent acid water brash and reflux sx's.  Further, the patient also has history of Vitamin D Deficiency and supplements vitamin D without any suspected side-effects. Last vitamin D was 62 on 11/23/2014.  Names of Other Physician/Practitioners you currently use: 1. [facility] here for primary care 2. [employer], eye doctor,  last visit 2015 3. Dr Shanon Brow Spong,DDS, dentist, last visit 2016/every 6 months  Patient Care  Team: [name], MD as PCP - General (Internal Medicine) Larey Dresser, MD as Consulting Physician (Cardiology) Sable Feil, MD as Consulting Physician (Gastroenterology) Heath Lark, MD as Consulting Physician (Hematology and Oncology) Jolaine Artist, MD as Consulting Physician (Cardiology)  Medication Review: Medication Sig  . ANDROGEL PUMP 20.25 MG/ACT (1.62%) GEL   . aspirin (ASPIR-LOW) 81 MG EC tablet Take 81 mg by mouth daily.    . baclofen (LIORESAL) 10 MG tablet   . buPROPion (WELLBUTRIN XL) 300 MG 24 hr tablet TAKE 1 TABLET DAILY FOR MOOD  . Colchicine (MITIGARE) 0.6 MG CAPS Take 1 capsule daily for gout  . esomeprazole (NEXIUM) 40 MG capsule   . fish oil-omega-3 fatty acids 1000 MG capsule Take 1 capsule by mouth 2 (two) times daily.    . Magnesium 250 MG TABS Take 1 tablet by mouth 2 (two) times daily.    . Multiple Vitamins-Minerals (STRESS B COMPLEX/ZINC) TABS Take 1 tablet by mouth daily.    . pantoprazole (PROTONIX) 40 MG tablet   . ranitidine (ZANTAC) 300 MG tablet Take 1 tablet 2 x d ay to allow taper off Nexium for heartburn  . sildenafil (REVATIO) 20 MG tablet Take 1 to 5 pills once daily as needed for XXXX  . Testosterone (ANDROGEL) 20.25 MG/1.25GM (1.62%) GEL Place 4 Squirts onto the skin daily. Daily   - disp 6 bottles (90 day supply) with refills for 6 months  . traMADol (ULTRAM) 50 MG tablet 1 tablet 2 x day for pain  . valsartan (DIOVAN) 320 MG tablet Take 1/2 to 1 tablet daily as directed for BP   Current Problems (verified) Patient Active Problem List   Diagnosis Date Noted  . 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 Prediabetes 01/26/2014  . Medication management 11/19/2013  . BPH (benign prostatic hypertrophy)   . GERD   . Fibromyalgia   . Testosterone Deficiency   . Hyperlipidemia   . Vitamin D deficiency   . Essential hypertension 02/15/2010   Screening Tests Health Maintenance  Topic Date Due  .  COLONOSCOPY  03/25/1997  . INFLUENZA VACCINE  04/26/2015  . TETANUS/TDAP  04/26/2019  . ZOSTAVAX  Completed  . PNA vac Low Risk Adult  Completed   Immunization History  Administered Date(s) Administered  . Influenza, High Dose Seasonal PF 08/13/2014  . Pneumococcal Conjugate-13 08/13/2014  . Pneumococcal Polysaccharide-23 07/12/2012  . Tdap 04/25/2009  . Zoster 07/27/2007   Preventative care: Last colonoscopy: 2005 - deferred f/u in 2015 , but is amenable to do Cologard  History reviewed: allergies, current medications, past family history, past medical history, past social history, past surgical history and problem list  Risk Factors: Tobacco History  Substance Use Topics  . Smoking status: Never Smoker   . Smokeless tobacco: Never Used     Comment: Rare cigars  . Alcohol Use: 7.2 oz/week    12 Standard drinks or equivalent per week     Comment: Occasional social drinker;cocktail, beer, or mixed drink daily   He does not smoke.  Patient is not a former smoker. Are there smokers in your home (other than you)?  No  Alcohol Current alcohol use: 1 beers per day(s), 1 liquor drinks per day(s)  Caffeine Current caffeine use: coffee 0-1 cups /day  Exercise Current exercise: gardening and yard work  Nutrition/Diet Current diet: in general, a "healthy" diet  Cardiac risk factors: advanced age (older than 54 for men, 87 for women), dyslipidemia, hypertension, male gender and sedentary lifestyle.  Depression Screen (Note: if answer to either of the following is "Yes", a more complete depression screening is indicated)   Q1: Over the past two weeks, have you felt down, depressed or hopeless? No  Q2: Over the past two weeks, have you felt little interest or pleasure in doing things? No  Have you lost interest or pleasure in daily life? No  Do you often feel hopeless? No  Do you cry easily over simple problems? No  Activities of Daily Living In your present state of  health, do you have any difficulty performing the following activities?:  Driving? No Managing money?  No Feeding yourself? No Getting from bed to chair? No Climbing a flight of stairs? No Preparing food and eating?: No Bathing or showering? No Getting dressed: No Getting to the toilet? No Using the toilet:No Moving around from place to place: No In the past year have you fallen or had a near fall?:No   Are you sexually active?  Yes  Do you have more than one partner?  No  Vision Difficulties: No  Hearing Difficulties: No Do you often ask people to speak up or repeat themselves? No Do you experience ringing or noises in your ears? No Do you have difficulty understanding soft or whispered voices? No  Cognition  Do you feel that you have a problem with memory?No  Do you often misplace items? No  Do you feel safe at home?  Yes  Advanced directives Does patient have a [place]? Yes Does patient have a Living Will? Yes  Past Medical History  Diagnosis Date  . BPH (benign prostatic hypertrophy)   . GERD (gastroesophageal reflux disease)   . Fibromyalgia   . Hypogonadism male   . History of epididymitis   . Myofascial pain syndrome   . Nephrolithiasis   . Prostatitis     Recurrent  . Prolapsed internal hemorrhoids   . History of head injury without skull fracture 1980    Concussion  . Tendinitis 2000    Hx of left bicep  . Rheumatic fever     x3 at ages [age] and 37; No significant valvular abnormality on 5/11 echo  . Exertional dyspnea     ETT myoview 5/11: 8 min, no ischemic ECG changes, stopped due to fatigue/ SOB. EF 60%, no ischemia or infarction. Echo 5/11 EF 60%, no significant valvular abnormalities, mild RV dilation and dysfunction, no complete TR doppler jet so PA pressure hard to gauge. 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 R heart cath 6/11: mean RA 10 mmHg, PA 25/18, mean PCWP 13 mmHg, CI 2.1  . Hypertension    . Hyperlipidemia     Myalgias with Lipitor  . Vitamin D deficiency   . 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 Erythrocytosis 04/28/2014   Past Surgical History  Procedure Laterality Date  . Cardiac catheterization  02/2010    Left and right  . Heel spur surgery  1960    Bilateral  . Tonsillectomy  1962  . Finger surgery  2000    Repair of injury to right fifth finger  . Bih repair  08/2005    Incarverated fat by mesh repair   ROS: Constitutional: Denies fever, chills, weight loss/gain, headaches, insomnia, fatigue, night sweats or change in appetite. Eyes: Denies redness, blurred vision, diplopia,  discharge, itchy or watery eyes.  ENT: Denies discharge, congestion, post nasal drip, epistaxis, sore throat, earache, hearing loss, dental pain, Tinnitus, Vertigo, Sinus pain or snoring.  Cardio: Denies chest pain, palpitations, irregular heartbeat, syncope, dyspnea, diaphoresis, orthopnea, PND, claudication or edema Respiratory: denies cough, dyspnea, DOE, pleurisy, hoarseness, laryngitis or wheezing.  Gastrointestinal: Denies dysphagia, heartburn, reflux, water brash, pain, cramps, nausea, vomiting, bloating, diarrhea, constipation, hematemesis, melena, hematochezia, jaundice or hemorrhoids Genitourinary: Denies dysuria, frequency, urgency, nocturia, hesitancy, discharge, hematuria or flank pain Musculoskeletal: Denies arthralgia, myalgia, stiffness, Jt. Swelling, pain, limp or strain/sprain. Denies Falls. Skin: Denies puritis, rash, hives, warts, acne, eczema or change in skin lesion Neuro: No weakness, tremor, incoordination, spasms, paresthesia or pain Psychiatric: Denies confusion, memory loss or sensory loss. Denies Depression. Endocrine: Denies change in weight, skin, hair change, nocturia, and paresthesia, diabetic polys, visual blurring or hyper / hypo glycemic episodes.  Heme/Lymph: No excessive bleeding, bruising or enlarged lymph nodes.  Objective:      BP 122/80   Pulse 68  Temp 97.3 F   Resp 16  Ht 5' 10.75"  Wt 204 lb     BMI 28.66   General Appearance:  Alert  WD/WN, male  in no apparent distress. Eyes: PERRLA, EOMs nl, conjunctiva normal, normal fundi and vessels. Sinuses: Sl bilat.  frontal/maxillary tenderness ENT/Mouth: EACs patent / Lt. TM  Nl. & Rt TM dull pink w/splayed reflex.  Nares clear without erythema, swelling, mucoid exudates. Oral hygiene is good. 2+ erythema, w/o exudate. Tongue normal, non-obstructing. Tonsils not swollen or erythematous. Hearing normal.  Neck: Supple, thyroid normal. No bruits, nodes or JVD. Respiratory: Respiratory effort normal.  BS equal and clear bilateral without rales, rhonci, wheezing or stridor. Cardio: Heart sounds are normal with regular rate and rhythm and no murmurs, rubs or gallops. Peripheral pulses are normal and equal bilaterally without edema. No aortic or femoral bruits. Chest: symmetric with normal excursions and percussion.  Abdomen: Flat, soft, with nl bowel sounds. Nontender, no guarding, rebound, hernias, masses, or organomegaly.  Lymphatics: Non tender without lymphadenopathy.  Musculoskeletal: Full ROM all peripheral extremities, joint stability, 5/5 strength, and normal gait. Skin: Warm and dry without rashes, lesions, cyanosis, clubbing or  ecchymosis.  Neuro: Cranial nerves intact, reflexes equal bilaterally. Normal muscle tone, no cerebellar symptoms. Sensation intact.  Pysch: Alert and oriented X 3 with normal affect, insight and judgment appropriate.   Cognitive Testing  Alert? Yes  Normal Appearance? Yes  Oriented to person? Yes  Place? Yes   Time? Yes  Recall of three objects?  Yes  Can perform simple calculations? Yes  Displays appropriate judgment? Yes  Can read the correct time from a watch/clock? Yes  Medicare Attestation I have personally reviewed: The patient's medical and social history Their use of alcohol, tobacco or illicit drugs Their current  medications and supplements The patient's functional ability including ADLs,fall risks, home safety risks, cognitive, and hearing and visual impairment Diet and physical activities Evidence for depression or mood disorders  The patient's weight, height, BMI, and visual acuity have been recorded in the chart.  I have made referrals, counseling, and provided education to the patient based on review of the above and I have provided the patient with a written personalized care plan for preventive services.  Over 40 minutes of exam, counseling, chart review was performed.  Sven Pinheiro DAVID, MD   02/23/2015

## 2015-02-23 ENCOUNTER — Ambulatory Visit (INDEPENDENT_AMBULATORY_CARE_PROVIDER_SITE_OTHER): Payer: Medicare Other | Admitting: Internal Medicine

## 2015-02-23 ENCOUNTER — Encounter: Payer: Self-pay | Admitting: Internal Medicine

## 2015-02-23 VITALS — BP 122/80 | HR 68 | Temp 97.3°F | Resp 16 | Ht 70.75 in | Wt 204.0 lb

## 2015-02-23 DIAGNOSIS — J320 Chronic maxillary sinusitis: Secondary | ICD-10-CM

## 2015-02-23 DIAGNOSIS — E785 Hyperlipidemia, unspecified: Secondary | ICD-10-CM

## 2015-02-23 DIAGNOSIS — Z9181 History of falling: Secondary | ICD-10-CM

## 2015-02-23 DIAGNOSIS — J029 Acute pharyngitis, unspecified: Secondary | ICD-10-CM

## 2015-02-23 DIAGNOSIS — I10 Essential (primary) hypertension: Secondary | ICD-10-CM

## 2015-02-23 DIAGNOSIS — Z0000 Encounter for general adult medical examination without abnormal findings: Secondary | ICD-10-CM

## 2015-02-23 DIAGNOSIS — R7309 Other abnormal glucose: Secondary | ICD-10-CM

## 2015-02-23 DIAGNOSIS — Z1331 Encounter for screening for depression: Secondary | ICD-10-CM

## 2015-02-23 DIAGNOSIS — E291 Testicular hypofunction: Secondary | ICD-10-CM

## 2015-02-23 DIAGNOSIS — Z0001 Encounter for general adult medical examination with abnormal findings: Secondary | ICD-10-CM

## 2015-02-23 DIAGNOSIS — R7303 Prediabetes: Secondary | ICD-10-CM

## 2015-02-23 DIAGNOSIS — Z79899 Other long term (current) drug therapy: Secondary | ICD-10-CM

## 2015-02-23 DIAGNOSIS — K219 Gastro-esophageal reflux disease without esophagitis: Secondary | ICD-10-CM

## 2015-02-23 LAB — BASIC METABOLIC PANEL WITH GFR
BUN: 19 mg/dL (ref 6–23)
CO2: 29 mEq/L (ref 19–32)
Calcium: 8.7 mg/dL (ref 8.4–10.5)
Chloride: 100 mEq/L (ref 96–112)
Creat: 1.16 mg/dL (ref 0.50–1.35)
GFR, Est African American: 75 mL/min
GFR, Est Non African American: 65 mL/min
Glucose, Bld: 72 mg/dL (ref 70–99)
Potassium: 4.1 mEq/L (ref 3.5–5.3)
Sodium: 138 mEq/L (ref 135–145)

## 2015-02-23 LAB — CBC WITH DIFFERENTIAL/PLATELET
Basophils Absolute: 0.1 10*3/uL (ref 0.0–0.1)
Basophils Relative: 1 % (ref 0–1)
Eosinophils Absolute: 0.2 10*3/uL (ref 0.0–0.7)
Eosinophils Relative: 2 % (ref 0–5)
HCT: 50.5 % (ref 39.0–52.0)
Hemoglobin: 17.8 g/dL — ABNORMAL HIGH (ref 13.0–17.0)
Lymphocytes Relative: 13 % (ref 12–46)
Lymphs Abs: 1.5 10*3/uL (ref 0.7–4.0)
MCH: 32.2 pg (ref 26.0–34.0)
MCHC: 35.2 g/dL (ref 30.0–36.0)
MCV: 91.5 fL (ref 78.0–100.0)
MPV: 10.3 fL (ref 8.6–12.4)
Monocytes Absolute: 1.6 10*3/uL — ABNORMAL HIGH (ref 0.1–1.0)
Monocytes Relative: 14 % — ABNORMAL HIGH (ref 3–12)
Neutro Abs: 7.8 10*3/uL — ABNORMAL HIGH (ref 1.7–7.7)
Neutrophils Relative %: 70 % (ref 43–77)
Platelets: 163 10*3/uL (ref 150–400)
RBC: 5.52 MIL/uL (ref 4.22–5.81)
RDW: 14.2 % (ref 11.5–15.5)
WBC: 11.2 10*3/uL — ABNORMAL HIGH (ref 4.0–10.5)

## 2015-02-23 LAB — HEPATIC FUNCTION PANEL
ALT: 61 U/L — ABNORMAL HIGH (ref 0–53)
AST: 23 U/L (ref 0–37)
Albumin: 3.8 g/dL (ref 3.5–5.2)
Alkaline Phosphatase: 50 U/L (ref 39–117)
Bilirubin, Direct: 0.2 mg/dL (ref 0.0–0.3)
Indirect Bilirubin: 0.8 mg/dL (ref 0.2–1.2)
Total Bilirubin: 1 mg/dL (ref 0.2–1.2)
Total Protein: 5.8 g/dL — ABNORMAL LOW (ref 6.0–8.3)

## 2015-02-23 LAB — LIPID PANEL
Cholesterol: 182 mg/dL (ref 0–200)
HDL: 54 mg/dL (ref >=40)
LDL Cholesterol: 99 mg/dL (ref 0–99)
Total CHOL/HDL Ratio: 3.4 Ratio
Triglycerides: 146 mg/dL (ref <150)
VLDL: 29 mg/dL (ref 0–40)

## 2015-02-23 LAB — MAGNESIUM: Magnesium: 2 mg/dL (ref 1.5–2.5)

## 2015-02-23 LAB — HEMOGLOBIN A1C
Hgb A1c MFr Bld: 5.8 % — ABNORMAL HIGH (ref <5.7)
Mean Plasma Glucose: 120 mg/dL — ABNORMAL HIGH (ref <117)

## 2015-02-23 LAB — TSH: TSH: 3.394 u[IU]/mL (ref 0.350–4.500)

## 2015-02-23 MED ORDER — PREDNISONE 20 MG PO TABS: ORAL_TABLET | ORAL | Status: DC

## 2015-02-23 MED ORDER — PANTOPRAZOLE SODIUM 40 MG PO TBEC: DELAYED_RELEASE_TABLET | ORAL | Status: DC

## 2015-02-23 MED ORDER — LEVOFLOXACIN 500 MG PO TABS: ORAL_TABLET | ORAL | Status: DC

## 2015-02-24 LAB — VITAMIN D 25 HYDROXY (VIT D DEFICIENCY, FRACTURES): Vit D, 25-Hydroxy: 69 ng/mL (ref 30–100)

## 2015-02-24 LAB — TESTOSTERONE: Testosterone: 183 ng/dL — ABNORMAL LOW (ref 300–890)

## 2015-02-24 LAB — INSULIN, RANDOM: Insulin: 7.9 u[IU]/mL (ref 2.0–19.6)

## 2015-02-25 ENCOUNTER — Telehealth: Payer: Self-pay

## 2015-02-25 NOTE — Telephone Encounter (Signed)
Patients wife Lovey Newcomer called and wanted to know what he should do about testosterone level being so low at 103 and he is doing 4 pumps daily of gel daily.. Per Dr Melford Aase, called patients wife and advised he can consider doing injections. States she will discuss with patient

## 2015-03-03 ENCOUNTER — Other Ambulatory Visit: Payer: Self-pay | Admitting: Internal Medicine

## 2015-03-04 ENCOUNTER — Ambulatory Visit (INDEPENDENT_AMBULATORY_CARE_PROVIDER_SITE_OTHER): Payer: Medicare Other | Admitting: Internal Medicine

## 2015-03-04 ENCOUNTER — Ambulatory Visit (HOSPITAL_COMMUNITY)
Admission: RE | Admit: 2015-03-04 | Discharge: 2015-03-04 | Disposition: A | Discharge status: Home / Self Care | Payer: Medicare Other | Source: Ambulatory Visit | Attending: Internal Medicine | Admitting: Internal Medicine

## 2015-03-04 ENCOUNTER — Encounter: Payer: Self-pay | Admitting: Internal Medicine

## 2015-03-04 VITALS — BP 122/62 | HR 76 | Temp 97.3°F | Resp 16 | Ht 70.75 in | Wt 205.6 lb

## 2015-03-04 DIAGNOSIS — J029 Acute pharyngitis, unspecified: Secondary | ICD-10-CM

## 2015-03-04 DIAGNOSIS — J449 Chronic obstructive pulmonary disease, unspecified: Secondary | ICD-10-CM | POA: Insufficient documentation

## 2015-03-04 DIAGNOSIS — J0410 Acute tracheitis without obstruction: Secondary | ICD-10-CM | POA: Diagnosis not present

## 2015-03-04 DIAGNOSIS — R05 Cough: Secondary | ICD-10-CM | POA: Insufficient documentation

## 2015-03-04 DIAGNOSIS — J0101 Acute recurrent maxillary sinusitis: Secondary | ICD-10-CM

## 2015-03-04 LAB — CBC WITH DIFFERENTIAL/PLATELET
Basophils Absolute: 0 10*3/uL (ref 0.0–0.1)
Basophils Relative: 0 % (ref 0–1)
Eosinophils Absolute: 0.2 10*3/uL (ref 0.0–0.7)
Eosinophils Relative: 3 % (ref 0–5)
HCT: 47.9 % (ref 39.0–52.0)
Hemoglobin: 17.6 g/dL — ABNORMAL HIGH (ref 13.0–17.0)
Lymphocytes Relative: 13 % (ref 12–46)
Lymphs Abs: 1 10*3/uL (ref 0.7–4.0)
MCH: 32.9 pg (ref 26.0–34.0)
MCHC: 36.7 g/dL — ABNORMAL HIGH (ref 30.0–36.0)
MCV: 89.5 fL (ref 78.0–100.0)
MPV: 11.1 fL (ref 8.6–12.4)
Monocytes Absolute: 1.1 10*3/uL — ABNORMAL HIGH (ref 0.1–1.0)
Monocytes Relative: 14 % — ABNORMAL HIGH (ref 3–12)
Neutro Abs: 5.5 10*3/uL (ref 1.7–7.7)
Neutrophils Relative %: 70 % (ref 43–77)
Platelets: 144 10*3/uL — ABNORMAL LOW (ref 150–400)
RBC: 5.35 MIL/uL (ref 4.22–5.81)
RDW: 13.9 % (ref 11.5–15.5)
WBC: 7.9 10*3/uL (ref 4.0–10.5)

## 2015-03-04 MED ORDER — AZITHROMYCIN 250 MG PO TABS: ORAL_TABLET | ORAL | Status: DC

## 2015-03-04 MED ORDER — AZITHROMYCIN 250 MG PO TABS: ORAL_TABLET | ORAL | Status: AC

## 2015-03-04 NOTE — Patient Instructions (Signed)
  Recommend take Mucus Relief 400 mg  &  take   2 tablets 3 x daily after meals  Also   Recommend take OTC  Store-brand of Sudafed - PE   (with phenylephrine)

## 2015-03-04 NOTE — Progress Notes (Signed)
Subjective:    Patient ID: Curtis Price., male    DOB: [birth year], 69 y.o.   MRN: [id number]  HPI  Patient is seen in 10 day f/u of recent Tx w/Levaquin & Prednisone for a suspected sinusitis / bronchitis. His WBC was sl elevated at 11.2 K with a left shift. He has continued to feel "bad" an reports a ST, dry cough and dyspnea.   Medication Sig  . aspirin (ASPIR-LOW) 81 MG EC tablet Take 81 mg by mouth daily.    . baclofen (LIORESAL) 10 MG tablet   . buPROPion (WELLBUTRIN XL) 300 MG 24 hr tablet TAKE 1 TABLET DAILY FOR MOOD  . Colchicine (MITIGARE) 0.6 MG CAPS Take 1 capsule daily for gout  . fish oil-omega-3 fatty acids 1000 MG capsule Take 1 capsule by mouth 2 (two) times daily.    Marland Kitchen levofloxacin (LEVAQUIN) 500 MG tablet Take 1 tablet daily with food for infection  . Magnesium 250 MG TABS Take 1 tablet by mouth 2 (two) times daily.    . Multiple Vitamins-Minerals (STRESS B COMPLEX/ZINC) TABS Take 1 tablet by mouth daily.    . pantoprazole (PROTONIX) 40 MG tablet Take 1 tablet daily for acid reflux  . Phenyleph-Promethazine-Cod 5-6.25-10 MG/5ML SYRP 1 to 2 tsp 4 x da or every 4 hours as needed for cough  . predniSONE (DELTASONE) 20 MG tablet 1 tab 3 x day for 3 days, then 1 tab 2 x day for 3 days, then 1 tab 1 x day for 5 days  . ranitidine (ZANTAC) 300 MG tablet Take 1 tablet 2 x d ay to allow taper off Nexium for heartburn  . sildenafil (REVATIO) 20 MG tablet Take 1 to 5 pills once daily as needed for XXXX  . Testosterone (ANDROGEL) 20.25 MG/1.25GM (1.62%) GEL Place 4 Squirts onto the skin daily. Daily   - disp 6 bottles (90 day supply) with refills for 6 months  . traMADol (ULTRAM) 50 MG tablet 1 tablet 2 x day for pain  . valsartan (DIOVAN) 160 MG tablet Take 160 mg by mouth daily.   Allergies  Allergen Reactions  . Atenolol   . Atorvastatin   . Darvocet [Propoxyphene N-Acetaminophen]   . Paroxetine   . Pregabalin   . Propoxyphene Hcl    Past Medical History  Diagnosis  Date  . BPH (benign prostatic hypertrophy)   . GERD (gastroesophageal reflux disease)   . Fibromyalgia   . Hypogonadism male   . History of epididymitis   . Myofascial pain syndrome   . Nephrolithiasis   . Prostatitis     Recurrent  . Prolapsed internal hemorrhoids   . History of head injury without skull fracture 1980    Concussion  . Tendinitis 2000    Hx of left bicep  . Rheumatic fever     x3 at ages [age] and 69; No significant valvular abnormality on 5/11 echo  . Exertional dyspnea     ETT myoview 5/11: 8 min, no ischemic ECG changes, stopped due to fatigue/ SOB. EF 60%, no ischemia or infarction. Echo 5/11 EF 60%, no significant valvular abnormalities, mild RV dilation and dysfunction, no complete TR doppler jet so PA pressure hard to gauge. 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 R heart cath 6/11: mean RA 10 mmHg, PA 25/18, mean PCWP 13 mmHg, CI 2.1  . Hypertension   . Hyperlipidemia     Myalgias with Lipitor  . Vitamin D deficiency   .  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 Erythrocytosis 04/28/2014    Review of Systems In addition to the HPI above,  No Fever-chills,  Some  Headache, No changes with Vision or hearing,  No problems swallowing food or Liquids,  No Chest pain or productive Cough but c/o Shortness of Breath,  No Abdominal pain, No Nausea or Vomitting, Bowel movements are regular,   No new skin rashes or bruises,  No new joints pains-aches,  No new weakness, tingling, numbness in any extremity,  No polyuria, polydypsia  No significant Mental Stressors.  A full 10 point Review of Systems was done, except as stated above, all other Review of Systems were negative    Objective:   Physical Exam  BP 122/62 mmHg  Pulse 76  Temp(Src) 97.3 F (36.3 C)  Resp 16  Ht 5' 10.75" (1.797 m)  Wt 205 lb 9.6 oz (93.26 kg)  BMI 28.88 kg/m2  HEENT - Eac's patent. TM's pink & with splayed reflex on the right - but left  is obscured by cerumen impaction. EOM's full. PERRLA. NasoOroPharynx  2(+) injected with few scattered areas of stomatitis over the hard palate.  Neck - supple. Nl Thyroid. Carotids 2+ & No bruits, nodes. Chest - Clear equal BS w/o Rales, rhonchi, wheezes. Cor - Nl HS. RRR w/o sig MGR.  Abd - No palpable organomegaly, masses or tenderness. BS nl. MS- FROM w/o deformities. Muscle power, tone and bulk Nl. Gait Nl. Neuro - No obvious Cr N abnormalities. Sensory, motor and Cerebellar functions appear Nl w/o focal abnormalities. Psyche - Mental status normal & appropriate.  No delusions, ideations or obvious mood abnormalities.    Assessment & Plan:   1. Acute recurrent maxillary sinusitis  - CBC with Differential/Platelet - azithromycin (ZITHROMAX) 250 MG tablet; Take 2 tablets (500 mg) on  Day 1,  followed by 1 tablet (250 mg) once daily on Days 2 through 5.  Dispense: 6 each; Refill: 1  2. Tracheitis  - CBC with Differential/Platelet - DG Chest 2 View - azithromycin (ZITHROMAX) 250 MG tablet; Take 2 tablets (500 mg) on  Day 1,  followed by 1 tablet (250 mg) once daily on Days 2 through 5.  Dispense: 6 each; Refill: 1 - Hold Levaquin while on Z Pak  3. Acute pharyngitis  - CBC with Differential/Platelet - Epstein-Barr virus VCA antibody panel  - Discussed meds/SE's.  - ROV - prn

## 2015-03-05 LAB — EPSTEIN-BARR VIRUS VCA ANTIBODY PANEL
EBV EA IgG: 39.3 U/mL — ABNORMAL HIGH (ref <9.0)
EBV NA IgG: 167 U/mL — ABNORMAL HIGH (ref <18.0)
EBV VCA IgG: 297 U/mL — ABNORMAL HIGH (ref <18.0)
EBV VCA IgM: <10 U/mL (ref <36.0)

## 2015-03-12 LAB — COLOGUARD

## 2015-03-15 ENCOUNTER — Other Ambulatory Visit: Payer: Self-pay | Admitting: *Deleted

## 2015-03-15 DIAGNOSIS — Z1211 Encounter for screening for malignant neoplasm of colon: Secondary | ICD-10-CM

## 2015-03-17 ENCOUNTER — Encounter: Payer: Self-pay | Admitting: Internal Medicine

## 2015-03-29 ENCOUNTER — Encounter: Payer: Self-pay | Admitting: *Deleted

## 2015-05-26 ENCOUNTER — Ambulatory Visit (AMBULATORY_SURGERY_CENTER): Payer: Self-pay | Admitting: *Deleted

## 2015-05-26 VITALS — Ht 71.0 in | Wt 207.0 lb

## 2015-05-26 DIAGNOSIS — Z86010 Personal history of colonic polyps: Secondary | ICD-10-CM

## 2015-05-26 NOTE — Progress Notes (Signed)
Patient denies any allergies to egg or soy products. Patient denies complications with anesthesia/sedation.  Patient denies oxygen use at home and denies diet medications. Emmi instructions for colonoscopy explained but patient refused.

## 2015-05-28 ENCOUNTER — Ambulatory Visit (INDEPENDENT_AMBULATORY_CARE_PROVIDER_SITE_OTHER): Payer: Medicare Other | Admitting: Internal Medicine

## 2015-05-28 ENCOUNTER — Encounter: Payer: Self-pay | Admitting: Internal Medicine

## 2015-05-28 VITALS — BP 124/70 | HR 72 | Temp 98.2°F | Resp 18 | Ht 70.75 in | Wt 204.0 lb

## 2015-05-28 DIAGNOSIS — I10 Essential (primary) hypertension: Secondary | ICD-10-CM | POA: Diagnosis not present

## 2015-05-28 DIAGNOSIS — E785 Hyperlipidemia, unspecified: Secondary | ICD-10-CM

## 2015-05-28 DIAGNOSIS — R7303 Prediabetes: Secondary | ICD-10-CM

## 2015-05-28 DIAGNOSIS — E559 Vitamin D deficiency, unspecified: Secondary | ICD-10-CM | POA: Diagnosis not present

## 2015-05-28 DIAGNOSIS — Z79899 Other long term (current) drug therapy: Secondary | ICD-10-CM

## 2015-05-28 DIAGNOSIS — R7309 Other abnormal glucose: Secondary | ICD-10-CM

## 2015-05-28 LAB — BASIC METABOLIC PANEL WITH GFR
BUN: 19 mg/dL (ref 7–25)
CO2: 27 mmol/L (ref 20–31)
Calcium: 9.4 mg/dL (ref 8.6–10.3)
Chloride: 104 mmol/L (ref 98–110)
Creat: 1.26 mg/dL — ABNORMAL HIGH (ref 0.70–1.25)
GFR, Est African American: 67 mL/min (ref >=60)
GFR, Est Non African American: 58 mL/min — ABNORMAL LOW (ref >=60)
Glucose, Bld: 94 mg/dL (ref 65–99)
Potassium: 4.4 mmol/L (ref 3.5–5.3)
Sodium: 140 mmol/L (ref 135–146)

## 2015-05-28 LAB — HEPATIC FUNCTION PANEL
ALT: 55 U/L — ABNORMAL HIGH (ref 9–46)
AST: 30 U/L (ref 10–35)
Albumin: 4.1 g/dL (ref 3.6–5.1)
Alkaline Phosphatase: 56 U/L (ref 40–115)
Bilirubin, Direct: 0.2 mg/dL (ref <=0.2)
Indirect Bilirubin: 1 mg/dL (ref 0.2–1.2)
Total Bilirubin: 1.2 mg/dL (ref 0.2–1.2)
Total Protein: 6.4 g/dL (ref 6.1–8.1)

## 2015-05-28 LAB — CBC WITH DIFFERENTIAL/PLATELET
Basophils Absolute: 0 10*3/uL (ref 0.0–0.1)
Basophils Relative: 0 % (ref 0–1)
Eosinophils Absolute: 0.2 10*3/uL (ref 0.0–0.7)
Eosinophils Relative: 4 % (ref 0–5)
HCT: 50.1 % (ref 39.0–52.0)
Hemoglobin: 17.9 g/dL — ABNORMAL HIGH (ref 13.0–17.0)
Lymphocytes Relative: 21 % (ref 12–46)
Lymphs Abs: 1.2 10*3/uL (ref 0.7–4.0)
MCH: 32.7 pg (ref 26.0–34.0)
MCHC: 35.7 g/dL (ref 30.0–36.0)
MCV: 91.6 fL (ref 78.0–100.0)
MPV: 11.3 fL (ref 8.6–12.4)
Monocytes Absolute: 0.9 10*3/uL (ref 0.1–1.0)
Monocytes Relative: 15 % — ABNORMAL HIGH (ref 3–12)
Neutro Abs: 3.5 10*3/uL (ref 1.7–7.7)
Neutrophils Relative %: 60 % (ref 43–77)
Platelets: 167 10*3/uL (ref 150–400)
RBC: 5.47 MIL/uL (ref 4.22–5.81)
RDW: 13.2 % (ref 11.5–15.5)
WBC: 5.8 10*3/uL (ref 4.0–10.5)

## 2015-05-28 LAB — HEMOGLOBIN A1C
Hgb A1c MFr Bld: 5.2 % (ref <5.7)
Mean Plasma Glucose: 103 mg/dL (ref <117)

## 2015-05-28 LAB — LIPID PANEL
Cholesterol: 201 mg/dL — ABNORMAL HIGH (ref 125–200)
HDL: 50 mg/dL (ref >=40)
LDL Cholesterol: 126 mg/dL (ref <130)
Total CHOL/HDL Ratio: 4 Ratio (ref <=5.0)
Triglycerides: 124 mg/dL (ref <150)
VLDL: 25 mg/dL (ref <30)

## 2015-05-28 LAB — MAGNESIUM: Magnesium: 2 mg/dL (ref 1.5–2.5)

## 2015-05-28 LAB — TSH: TSH: 2.507 u[IU]/mL (ref 0.350–4.500)

## 2015-05-28 NOTE — Progress Notes (Signed)
Patient ID: Curtis Price., male   DOB: 1946-06-01, 69 y.o.   MRN: [id number]  Assessment and Plan:  Hypertension:  -Continue medication,  -monitor blood pressure at home.  -Continue DASH diet.   -Reminder to go to the ER if any CP, SOB, nausea, dizziness, severe HA, changes vision/speech, left arm numbness and tingling, and jaw pain.  Cholesterol: -Continue diet and exercise.  -Check cholesterol.   Pre-diabetes: -Continue diet and exercise.  -Check A1C  Vitamin D Def: -check level -continue medications.   Continue diet and meds as discussed. Further disposition pending results of labs.  HPI 69 y.o. male  presents for 3 month follow up with hypertension, hyperlipidemia, prediabetes and vitamin D.   His blood pressure has been controlled at home, today their BP is BP: 124/70 mmHg.   He does workout. He denies chest pain, shortness of breath, dizziness.  He reports that he has been very busy lately.  He reports that his wife has been in the hospital.     He is on cholesterol medication and denies myalgias. His cholesterol is at goal. The cholesterol last visit was:   Lab Results  Component Value Date   CHOL 182 02/23/2015   HDL 54 02/23/2015   LDLCALC 99 02/23/2015   LDLDIRECT 151.0 02/10/2010   TRIG 146 02/23/2015   CHOLHDL 3.4 02/23/2015     He has been working on diet and exercise for prediabetes, and denies foot ulcerations, hyperglycemia, hypoglycemia , increased appetite, nausea, paresthesia of the feet, polydipsia, polyuria, visual disturbances, vomiting and weight loss. Last A1C in the office was:  Lab Results  Component Value Date   HGBA1C 5.8* 02/23/2015  He reports that he has been having some sandwiches and breakfast and has been skipping dinner.    Patient is on Vitamin D supplement.  Lab Results  Component Value Date   VD25OH 69 02/23/2015      Current Medications:  Current Outpatient Prescriptions on File Prior to Visit  Medication Sig Dispense  Refill  . aspirin (ASPIR-LOW) 81 MG EC tablet Take 81 mg by mouth daily.      . baclofen (LIORESAL) 10 MG tablet     . bisacodyl (DULCOLAX) 5 MG EC tablet Take 5 mg by mouth daily as needed for moderate constipation. Dulcolax 5 mg tab take as directed for colonoscopy prep.    Marland Kitchen buPROPion (WELLBUTRIN XL) 300 MG 24 hr tablet TAKE 1 TABLET DAILY FOR MOOD 90 tablet 1  . Colchicine (MITIGARE) 0.6 MG CAPS Take 1 capsule daily for gout 90 capsule 1  . fish oil-omega-3 fatty acids 1000 MG capsule Take 1 capsule by mouth 2 (two) times daily.      . Magnesium 250 MG TABS Take 1 tablet by mouth 2 (two) times daily.      . Multiple Vitamins-Minerals (STRESS B COMPLEX/ZINC) TABS Take 1 tablet by mouth daily.      . pantoprazole (PROTONIX) 40 MG tablet Take 1 tablet daily for acid reflux 90 tablet 99  . polyethylene glycol powder (MIRALAX) powder Take 1 Container by mouth once. Miralax 238 grams as directed for colonoscopy prep.    . ranitidine (ZANTAC) 300 MG tablet Take 1 tablet 2 x d ay to allow taper off Nexium for heartburn 180 tablet 1  . sildenafil (REVATIO) 20 MG tablet Take 1 to 5 pills once daily as needed for XXXX 50 tablet 99  . Testosterone (ANDROGEL) 20.25 MG/1.25GM (1.62%) GEL Place 4 Squirts onto the skin daily.  Daily   - disp 6 bottles (90 day supply) with refills for 6 months 6 g 3  . traMADol (ULTRAM) 50 MG tablet 1 tablet 2 x day for pain 180 tablet 2  . valsartan (DIOVAN) 160 MG tablet Take 160 mg by mouth daily.     No current facility-administered medications on file prior to visit.    Medical History:  Past Medical History  Diagnosis Date  . BPH (benign prostatic hypertrophy)   . GERD (gastroesophageal reflux disease)   . Fibromyalgia   . Hypogonadism male   . History of epididymitis   . Myofascial pain syndrome   . Nephrolithiasis   . Prostatitis     Recurrent  . Prolapsed internal hemorrhoids   . History of head injury without skull fracture 1980    Concussion  .  Tendinitis 2000    Hx of left bicep  . Rheumatic fever     x3 at ages [age] and 78; No significant valvular abnormality on 5/11 echo  . Exertional dyspnea     ETT myoview 5/11: 8 min, no ischemic ECG changes, stopped due to fatigue/ SOB. EF 60%, no ischemia or infarction. Echo 5/11 EF 60%, no significant valvular abnormalities, mild RV dilation and dysfunction, no complete TR doppler jet so PA pressure hard to gauge. 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 R heart cath 6/11: mean RA 10 mmHg, PA 25/18, mean PCWP 13 mmHg, CI 2.1  . Hypertension   . Hyperlipidemia     Myalgias with Lipitor  . Vitamin D deficiency   . 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 Erythrocytosis 04/28/2014    Allergies:  Allergies  Allergen Reactions  . Atenolol   . Atorvastatin   . Darvocet [Propoxyphene N-Acetaminophen]   . Paroxetine   . Pregabalin   . Propoxyphene Hcl      Review of Systems:  Review of Systems  Constitutional: Negative for fever, chills and malaise/fatigue.  HENT: Negative for congestion, ear pain and sore throat.   Eyes: Negative for blurred vision and double vision.  Respiratory: Negative for cough, shortness of breath and wheezing.   Cardiovascular: Negative for chest pain, palpitations and leg swelling.  Gastrointestinal: Negative for heartburn, diarrhea, constipation, blood in stool and melena.  Genitourinary: Negative.   Musculoskeletal: Negative.   Skin: Negative.   Neurological: Negative for dizziness, sensory change, loss of consciousness and headaches.  Psychiatric/Behavioral: Negative for depression. The patient is not nervous/anxious and does not have insomnia.     Family history- Review and unchanged  Social history- Review and unchanged  Physical Exam: BP 124/70 mmHg  Pulse 72  Temp(Src) 98.2 F (36.8 C) (Temporal)  Resp 18  Ht 5' 10.75" (1.797 m)  Wt 204 lb (92.534 kg)  BMI 28.66 kg/m2 Wt Readings from Last 3  Encounters:  05/28/15 204 lb (92.534 kg)  05/26/15 207 lb (93.895 kg)  03/04/15 205 lb 9.6 oz (93.26 kg)    General Appearance: Well nourished well developed, in no apparent distress. Eyes: PERRLA, EOMs, conjunctiva no swelling or erythema ENT/Mouth: Ear canals normal without obstruction, swelling, erythma, discharge.  TMs normal bilaterally.  Oropharynx moist, clear, without exudate, or postoropharyngeal swelling. Neck: Supple, thyroid normal,no cervical adenopathy  Respiratory: Respiratory effort normal, Breath sounds clear A&P without rhonchi, wheeze, or rale.  No retractions, no accessory usage. Cardio: RRR with no MRGs. Brisk peripheral pulses without edema.  Abdomen: Soft, + BS,  Non tender, no guarding, rebound, hernias,  masses. Musculoskeletal: Full ROM, 5/5 strength, Normal gait Skin: Warm, dry without rashes, lesions, ecchymosis.  Neuro: Awake and oriented X 3, Cranial nerves intact. Normal muscle tone, no cerebellar symptoms. Psych: Normal affect, Insight and Judgment appropriate.    Starlyn Skeans, PA-C 9:08 AM [facility]

## 2015-05-29 LAB — VITAMIN D 25 HYDROXY (VIT D DEFICIENCY, FRACTURES): Vit D, 25-Hydroxy: 58 ng/mL (ref 30–100)

## 2015-05-29 LAB — INSULIN, RANDOM: Insulin: 8.2 u[IU]/mL (ref 2.0–19.6)

## 2015-06-03 ENCOUNTER — Other Ambulatory Visit: Payer: Self-pay | Admitting: Internal Medicine

## 2015-06-09 ENCOUNTER — Encounter: Payer: Self-pay | Admitting: Internal Medicine

## 2015-06-09 ENCOUNTER — Ambulatory Visit (AMBULATORY_SURGERY_CENTER): Payer: Medicare Other | Admitting: Internal Medicine

## 2015-06-09 VITALS — BP 120/86 | HR 68 | Temp 97.0°F | Resp 20 | Ht 70.0 in | Wt 204.0 lb

## 2015-06-09 DIAGNOSIS — Z86010 Personal history of colonic polyps: Secondary | ICD-10-CM

## 2015-06-09 DIAGNOSIS — D124 Benign neoplasm of descending colon: Secondary | ICD-10-CM | POA: Diagnosis not present

## 2015-06-09 DIAGNOSIS — D125 Benign neoplasm of sigmoid colon: Secondary | ICD-10-CM

## 2015-06-09 HISTORY — PX: COLONOSCOPY: SHX174

## 2015-06-09 MED ORDER — SODIUM CHLORIDE 0.9 % IV SOLN: 500.0000 mL | INTRAVENOUS | Status: DC

## 2015-06-09 NOTE — Op Note (Signed)
[place]  [address] [place], [address]   COLONOSCOPY PROCEDURE REPORT  PATIENT: Curtis Price, Curtis Price  MR#: [id number] BIRTHDATE: [birth year] , 89  yrs. old GENDER: male ENDOSCOPIST: Gatha Mayer, MD, [facility] PROCEDURE DATE:  06/09/2015 PROCEDURE:   Colonoscopy, surveillance and Colonoscopy with snare polypectomy First Screening Colonoscopy - Avg.  risk and is 50 yrs.  old or older - No.  Prior Negative Screening - Now for repeat screening. N/A  History of Adenoma - Now for follow-up colonoscopy & has been > or = to 3 yrs.  Yes hx of adenoma.  Has been 3 or more years since last colonoscopy.  Polyps removed today? Yes ASA CLASS:   Class II INDICATIONS:Surveillance due to prior colonic neoplasia and PH Colon Adenoma. MEDICATIONS: Propofol 450 mg IV and Monitored anesthesia care  DESCRIPTION OF PROCEDURE:   After the risks benefits and alternatives of the procedure were thoroughly explained, informed consent was obtained.  The digital rectal exam revealed no abnormalities of the rectum, revealed no prostatic nodules, and revealed the prostate was not enlarged.   The LB [id number] endoscope was introduced through the anus and advanced to the cecum, which was identified by both the appendix and ileocecal valve. No adverse events experienced.   The quality of the prep was excellent.  (MiraLax was used)  The instrument was then slowly withdrawn as the colon was fully examined. Estimated blood loss is zero unless otherwise noted in this procedure report.  COLON FINDINGS: Two polypoid shaped sessile polyps ranging from 3 to 2mm in size were found in the descending colon and sigmoid colon. Polypectomies were performed with a cold snare.  The resection was complete, the polyp tissue was completely retrieved and sent to histology.   There was diverticulosis noted in the sigmoid colon. The examination was otherwise normal.  Retroflexed views revealed no  abnormalities. The time to cecum = 3.1 Withdrawal time = 15.0 The scope was withdrawn and the procedure completed. COMPLICATIONS: There were no immediate complications.  ENDOSCOPIC IMPRESSION: 1.   Two sessile polyps ranging from 3 to 10mm in size were found in the descending colon and sigmoid colon; polypectomies were performed with a cold snare 2.   Diverticulosis was noted in the sigmoid colon 3.   The examination was otherwise normal- excellent prep  RECOMMENDATIONS: Timing of repeat colonoscopy will be determined by pathology findings.  He has hx 6 mm adenoma removed 2008  eSigned:  Gatha Mayer, MD, [facility] 06/09/2015 10:06 AM   cc: Dr. [name]     the Patient

## 2015-06-09 NOTE — Progress Notes (Signed)
Called to room to assist during endoscopic procedure.  Patient ID and intended procedure confirmed with present staff. Received instructions for my participation in the procedure from the performing physician.  

## 2015-06-09 NOTE — Progress Notes (Signed)
Transferred to recovery room. A/O x3, pleased with MAC.  VSS.  Report to Celia, RN. 

## 2015-06-09 NOTE — Patient Instructions (Addendum)
I found and removed 2 small polyps. You also have a condition called diverticulosis - common and not usually a problem. Please read the handout provided.  I will let you know pathology results and when to have another routine colonoscopy by mail. I appreciate the opportunity to care for you.  Gatha Mayer, MD, [facility]  Discharge instructions given. Handouts on polyps and diverticulosis. Resume previous medications. YOU HAD AN ENDOSCOPIC PROCEDURE TODAY AT [facility]:   Refer to the procedure report that was given to you for any specific questions about what was found during the examination.  If the procedure report does not answer your questions, please call your gastroenterologist to clarify.  If you requested that your care partner not be given the details of your procedure findings, then the procedure report has been included in a sealed envelope for you to review at your convenience later.  YOU SHOULD EXPECT: Some feelings of bloating in the abdomen. Passage of more gas than usual.  Walking can help get rid of the air that was put into your GI tract during the procedure and reduce the bloating. If you had a lower endoscopy (such as a colonoscopy or flexible sigmoidoscopy) you may notice spotting of blood in your stool or on the toilet paper. If you underwent a bowel prep for your procedure, you may not have a normal bowel movement for a few days.  Please Note:  You might notice some irritation and congestion in your nose or some drainage.  This is from the oxygen used during your procedure.  There is no need for concern and it should clear up in a day or so.  SYMPTOMS TO REPORT IMMEDIATELY:   Following lower endoscopy (colonoscopy or flexible sigmoidoscopy):  Excessive amounts of blood in the stool  Significant tenderness or worsening of abdominal pains  Swelling of the abdomen that is new, acute  Fever of 100F or higher   For urgent or emergent issues, a  gastroenterologist can be reached at any hour by calling [phone].   DIET: Your first meal following the procedure should be a small meal and then it is ok to progress to your normal diet. Heavy or fried foods are harder to digest and may make you feel nauseous or bloated.  Likewise, meals heavy in dairy and vegetables can increase bloating.  Drink plenty of fluids but you should avoid alcoholic beverages for 24 hours.  ACTIVITY:  You should plan to take it easy for the rest of today and you should NOT DRIVE or use heavy machinery until tomorrow (because of the sedation medicines used during the test).    FOLLOW UP: Our staff will call the number listed on your records the next business day following your procedure to check on you and address any questions or concerns that you may have regarding the information given to you following your procedure. If we do not reach you, we will leave a message.  However, if you are feeling well and you are not experiencing any problems, there is no need to return our call.  We will assume that you have returned to your regular daily activities without incident.  If any biopsies were taken you will be contacted by phone or by letter within the next 1-3 weeks.  Please call us at [phone] if you have not heard about the biopsies in 3 weeks.    SIGNATURES/CONFIDENTIALITY: You and/or your care partner have signed paperwork which will be  entered into your electronic medical record.  These signatures attest to the fact that that the information above on your After Visit Summary has been reviewed and is understood.  Full responsibility of the confidentiality of this discharge information lies with you and/or your care-partner. 

## 2015-06-10 ENCOUNTER — Telehealth: Payer: Self-pay

## 2015-06-10 NOTE — Telephone Encounter (Signed)
  Follow up Call-  Call back number 06/09/2015  Post procedure Call Back phone  # [phone]  Permission to leave phone message Yes     Patient questions:  Do you have a fever, pain , or abdominal swelling? No. Pain Score  0 *  Have you tolerated food without any problems? Yes.    Have you been able to return to your normal activities? Yes.    Do you have any questions about your discharge instructions: Diet   No. Medications  No. Follow up visit  No.  Do you have questions or concerns about your Care? No.  Actions: * If pain score is 4 or above: No action needed, pain <4.

## 2015-06-22 ENCOUNTER — Encounter: Payer: Self-pay | Admitting: Internal Medicine

## 2015-06-22 DIAGNOSIS — Z86010 Personal history of colonic polyps: Secondary | ICD-10-CM

## 2015-06-22 NOTE — Progress Notes (Signed)
Quick Note:  2 adenomas - repeat colonoscopy 2021 ______ 

## 2015-08-31 ENCOUNTER — Ambulatory Visit (INDEPENDENT_AMBULATORY_CARE_PROVIDER_SITE_OTHER): Payer: Medicare Other | Admitting: Internal Medicine

## 2015-08-31 ENCOUNTER — Encounter: Payer: Self-pay | Admitting: Internal Medicine

## 2015-08-31 VITALS — BP 104/64 | HR 104 | Temp 97.3°F | Resp 16 | Ht 70.0 in | Wt 209.2 lb

## 2015-08-31 DIAGNOSIS — Z0001 Encounter for general adult medical examination with abnormal findings: Secondary | ICD-10-CM

## 2015-08-31 DIAGNOSIS — F329 Major depressive disorder, single episode, unspecified: Secondary | ICD-10-CM

## 2015-08-31 DIAGNOSIS — Z23 Encounter for immunization: Secondary | ICD-10-CM

## 2015-08-31 DIAGNOSIS — I10 Essential (primary) hypertension: Secondary | ICD-10-CM | POA: Diagnosis not present

## 2015-08-31 DIAGNOSIS — N400 Enlarged prostate without lower urinary tract symptoms: Secondary | ICD-10-CM

## 2015-08-31 DIAGNOSIS — Z125 Encounter for screening for malignant neoplasm of prostate: Secondary | ICD-10-CM

## 2015-08-31 DIAGNOSIS — M797 Fibromyalgia: Secondary | ICD-10-CM

## 2015-08-31 DIAGNOSIS — F32A Depression, unspecified: Secondary | ICD-10-CM

## 2015-08-31 DIAGNOSIS — J324 Chronic pansinusitis: Secondary | ICD-10-CM

## 2015-08-31 DIAGNOSIS — E785 Hyperlipidemia, unspecified: Secondary | ICD-10-CM

## 2015-08-31 DIAGNOSIS — Z79899 Other long term (current) drug therapy: Secondary | ICD-10-CM

## 2015-08-31 DIAGNOSIS — R7309 Other abnormal glucose: Secondary | ICD-10-CM

## 2015-08-31 DIAGNOSIS — Z6830 Body mass index (BMI) 30.0-30.9, adult: Secondary | ICD-10-CM

## 2015-08-31 DIAGNOSIS — Z0000 Encounter for general adult medical examination without abnormal findings: Secondary | ICD-10-CM

## 2015-08-31 DIAGNOSIS — Z1212 Encounter for screening for malignant neoplasm of rectum: Secondary | ICD-10-CM

## 2015-08-31 DIAGNOSIS — Z1331 Encounter for screening for depression: Secondary | ICD-10-CM

## 2015-08-31 DIAGNOSIS — R7303 Prediabetes: Secondary | ICD-10-CM

## 2015-08-31 DIAGNOSIS — E559 Vitamin D deficiency, unspecified: Secondary | ICD-10-CM

## 2015-08-31 DIAGNOSIS — E291 Testicular hypofunction: Secondary | ICD-10-CM

## 2015-08-31 DIAGNOSIS — K219 Gastro-esophageal reflux disease without esophagitis: Secondary | ICD-10-CM

## 2015-08-31 DIAGNOSIS — N529 Male erectile dysfunction, unspecified: Secondary | ICD-10-CM | POA: Insufficient documentation

## 2015-08-31 DIAGNOSIS — Z6828 Body mass index (BMI) 28.0-28.9, adult: Secondary | ICD-10-CM

## 2015-08-31 DIAGNOSIS — Z9181 History of falling: Secondary | ICD-10-CM

## 2015-08-31 LAB — LIPID PANEL
Cholesterol: 202 mg/dL — ABNORMAL HIGH (ref 125–200)
HDL: 54 mg/dL (ref >=40)
LDL Cholesterol: 71 mg/dL (ref <130)
Total CHOL/HDL Ratio: 3.7 Ratio (ref <=5.0)
Triglycerides: 384 mg/dL — ABNORMAL HIGH (ref <150)
VLDL: 77 mg/dL — ABNORMAL HIGH (ref <30)

## 2015-08-31 LAB — BASIC METABOLIC PANEL WITH GFR
BUN: 25 mg/dL (ref 7–25)
CO2: 26 mmol/L (ref 20–31)
Calcium: 9.2 mg/dL (ref 8.6–10.3)
Chloride: 103 mmol/L (ref 98–110)
Creat: 1.18 mg/dL (ref 0.70–1.25)
GFR, Est African American: 73 mL/min (ref >=60)
GFR, Est Non African American: 63 mL/min (ref >=60)
Glucose, Bld: 111 mg/dL — ABNORMAL HIGH (ref 65–99)
Potassium: 4.3 mmol/L (ref 3.5–5.3)
Sodium: 137 mmol/L (ref 135–146)

## 2015-08-31 LAB — HEPATIC FUNCTION PANEL
ALT: 38 U/L (ref 9–46)
AST: 21 U/L (ref 10–35)
Albumin: 3.7 g/dL (ref 3.6–5.1)
Alkaline Phosphatase: 51 U/L (ref 40–115)
Bilirubin, Direct: 0.1 mg/dL (ref <=0.2)
Indirect Bilirubin: 0.8 mg/dL (ref 0.2–1.2)
Total Bilirubin: 0.9 mg/dL (ref 0.2–1.2)
Total Protein: 5.7 g/dL — ABNORMAL LOW (ref 6.1–8.1)

## 2015-08-31 LAB — MAGNESIUM: Magnesium: 1.7 mg/dL (ref 1.5–2.5)

## 2015-08-31 MED ORDER — PREDNISONE 20 MG PO TABS
ORAL_TABLET | ORAL | Status: DC
Start: 2015-08-31 — End: 2015-11-30

## 2015-08-31 MED ORDER — AZITHROMYCIN 250 MG PO TABS: ORAL_TABLET | ORAL | Status: DC

## 2015-08-31 NOTE — Progress Notes (Signed)
Patient ID: Curtis Price., male   DOB: 1946/07/23, 69 y.o.   MRN: [id number]  Annual  Screening/Preventative Visit And Comprehensive Evaluation & Examination  This very nice 69 y.o. MWM presents for presents for a Wellness/Preventative Visit & comprehensive evaluation and management of multiple medical co-morbidities.  Patient has been followed for HTN, Prediabetes, Hyperlipidemia and Vitamin D Deficiency. Patient also c/o recurrent sinusitis w/ greenish nasal drainage. Patient also has GERD controlled with diet and meds. Patient also has hx/o Fibromyalgia which seems to be in remission over the last several years which in hindsight speculate that a lot of his prior symptoms of myalgias, fatigue may have been due to Vit D Deficiency.    HTN predates since 23. Patient's BP has been controlled at home.Today's BP: 104/64 mmHg. Patient denies any cardiac symptoms as chest pain, palpitations, shortness of breath, dizziness or ankle swelling.   Patient's hyperlipidemia is not controlled with poor dietary diet compliance and medications. Patient denies myalgias or other medication SE's. Last lipids were not at goal  Cholesterol 201*; HDL 50; LDL 126; Triglycerides 124 on 05/28/2015.   Patient has Morbid Obesity (BMI 30+) and is screened expectantly for prediabetes and patient denies reactive hypoglycemic symptoms, visual blurring, diabetic polys or paresthesias. Last A1c was 5.2% on 05/28/2015.   Finally, patient has history of Vitamin D Deficiency of 27 in 2008 and last vitamin D was 58 on 05/28/2015.   Medication Sig  . aspirin  81 MG ECt Take 81 mg by mouth daily.    . baclofen (LIORESAL) 10 MG tablet   . buPROPion- XL 300 MG 24 hr tablet TAKE 1 TABLET DAILY FOR MOOD  . Colchicine (MITIGARE) 0.6 MG CAPS Take 1 capsule daily for gout  . fish oil-omega-3  1000 MG  Take 1 capsule by mouth 2 (two) times daily.    . Magnesium 250 MG TABS Take 1 tablet by mouth 2 (two) times daily.    . STRESS B  COMPLEX/ZINC Take 1 tablet by mouth daily.    . pantoprazole  40 MG tablet Take 1 tablet daily for acid reflux  . ranitidine 300 MG tablet Take 1 tablet 2 x day to allow taper off Nexium for heartburn  . ANDROGEL (1.62%) GEL Place 4 Squirts onto the skin daily. Daily   - disp 6 bottles (90 day supply) with refills for 6 months  . traMADol (ULTRAM) 50 MG tablet 1 tablet 2 x day for pain  . valsartan (DIOVAN) 160 MG tablet Take 160 mg by mouth daily.   Allergies  Allergen Reactions  . Atenolol   . Atorvastatin   . Darvocet [Propoxyphene N-Acetaminophen]   . Paroxetine   . Pregabalin   . Propoxyphene Hcl    Past Medical History  Diagnosis Date  . BPH (benign prostatic hypertrophy)   . GERD (gastroesophageal reflux disease)   . Fibromyalgia   . Hypogonadism male   . History of epididymitis   . Myofascial pain syndrome   . Nephrolithiasis   . Prostatitis     Recurrent  . Prolapsed internal hemorrhoids   . History of head injury without skull fracture 1980    Concussion  . Tendinitis 2000    Hx of left bicep  . Rheumatic fever     x3 at ages [age] and 40; No significant valvular abnormality on 5/11 echo  . Exertional dyspnea     ETT myoview 5/11: 8 min, no ischemic ECG changes, stopped due to fatigue/ SOB. EF 60%,  no ischemia or infarction. Echo 5/11 EF 60%, no significant valvular abnormalities, mild RV dilation and dysfunction, no complete TR doppler jet so PA pressure hard to gauge. 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 R heart cath 6/11: mean RA 10 mmHg, PA 25/18, mean PCWP 13 mmHg, CI 2.1  . Hypertension   . Hyperlipidemia     Myalgias with Lipitor  . Vitamin D deficiency   . 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 Erythrocytosis 04/28/2014  . Hx of adenomatous polyp of colon 06/19/2007   Health Maintenance  Topic Date Due  . Hepatitis C Screening  May 12, 1946  . INFLUENZA VACCINE  04/26/2015  . TETANUS/TDAP  04/26/2019  .  COLONOSCOPY  06/08/2020  . ZOSTAVAX  Completed  . PNA vac Low Risk Adult  Completed   Immunization History  Administered Date(s) Administered  . Influenza, High Dose Seasonal PF 08/13/2014  . Pneumococcal Conjugate-13 08/13/2014  . Pneumococcal Polysaccharide-23 07/12/2012  . Tdap 04/25/2009  . Zoster 07/27/2007   Past Surgical History  Procedure Laterality Date  . Cardiac catheterization  02/2010    Left and right  . Heel spur surgery  1960    Bilateral  . Tonsillectomy  1962  . Finger surgery  2000    Repair of injury to right fifth finger  . Bih repair  08/2005    Incarverated fat by mesh repair   Family History  Problem Relation Age of Onset  . Hypertension Mother   . Breast cancer Mother   . Aortic stenosis Father   . Stroke Neg Hx   . Colon cancer Neg Hx   . Colon polyps Neg Hx   . Hypertension Other   . Aortic stenosis Other   . Breast cancer Other   . Diabetes Other   . Other Other     ASHD and epilepsy    Social History   Social History  . Marital Status: Married    Spouse Name: N/A  . Number of Children: N/A  . Years of Education: N/A   Occupational History  . 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 Smoking status: Never Smoker   . Smokeless tobacco: Never Used  . Alcohol Use: 7.2 oz/week    12 Cans of beer per week     Comment: beer and liquoer  . Drug Use: No  . Sexual Activity: Active   Social History Narrative   Married, lives in [place]    ROS Constitutional: Denies fever, chills, weight loss/gain, headaches, insomnia,  night sweats or change in appetite. Does c/o fatigue. Eyes: Denies redness, blurred vision, diplopia, discharge, itchy or watery eyes.  ENT: Denies discharge, congestion, post nasal drip, epistaxis, sore throat, earache, hearing loss, dental pain, Tinnitus, Vertigo, Sinus pain or snoring.  Cardio: Denies chest pain, palpitations,  irregular heartbeat, syncope, dyspnea, diaphoresis, orthopnea, PND, claudication or edema Respiratory: denies cough, dyspnea, DOE, pleurisy, hoarseness, laryngitis or wheezing.  Gastrointestinal: Denies dysphagia, heartburn, reflux, water brash, pain, cramps, nausea, vomiting, bloating, diarrhea, constipation, hematemesis, melena, hematochezia, jaundice or hemorrhoids Genitourinary: Denies dysuria, frequency, urgency, nocturia, hesitancy, discharge, hematuria or flank pain Musculoskeletal: Denies arthralgia, myalgia, stiffness, Jt. Swelling, pain, limp or strain/sprain. Denies Falls. Skin: Denies puritis, rash, hives, warts, acne, eczema or change in skin lesion Neuro: No weakness, tremor, incoordination, spasms, paresthesia or pain Psychiatric: Denies confusion, memory loss or sensory  loss. Denies Depression. Endocrine: Denies change in weight, skin, hair change, nocturia, and paresthesia, diabetic polys, visual blurring or hyper / hypo glycemic episodes.  Heme/Lymph: No excessive bleeding, bruising or enlarged lymph nodes.  Physical Exam  BP 104/64 mmHg  Pulse 104  Temp(Src) 97.3 F (36.3 C)  Resp 16  Ht 5\' 10"  (1.778 m)  Wt 209 lb 3.2 oz (94.892 kg)  BMI 30.02 kg/m2  General Appearance: Well nourished, in no apparent distress. Eyes: PERRLA, EOMs, conjunctiva no swelling or erythema, normal fundi and vessels. Sinuses: No frontal/maxillary tenderness ENT/Mouth: EACs patent / TMs  nl. Nares clear without erythema, swelling, mucoid exudates. Oral hygiene is good. No erythema, swelling, or exudate. Tongue normal, non-obstructing. Tonsils not swollen or erythematous. Hearing normal.  Neck: Supple, thyroid normal. No bruits, nodes or JVD. Respiratory: Respiratory effort normal.  BS equal and clear bilateral without rales, rhonci, wheezing or stridor. Cardio: Heart sounds are normal with regular rate and rhythm and no murmurs, rubs or gallops. Peripheral pulses are normal and equal bilaterally  without edema. No aortic or femoral bruits. Chest: symmetric with normal excursions and percussion.  Abdomen: Flat, soft, with bowl sounds. Nontender, no guarding, rebound, hernias, masses, or organomegaly.  Lymphatics: Non tender without lymphadenopathy.  Genitourinary: No hernias.Testes nl. DRE - prostate nl for age - smooth & firm w/o nodules. Musculoskeletal: Full ROM all peripheral extremities, joint stability, 5/5 strength, and normal gait. Skin: Warm and dry without rashes, lesions, cyanosis, clubbing or  ecchymosis.  Neuro: Cranial nerves intact, reflexes equal bilaterally. Normal muscle tone, no cerebellar symptoms. Sensation intact.  Pysch: Awake and oriented X 3 with normal affect, insight and judgment appropriate.   Assessment and Plan  1. Annual Preventative/Screening Exam   - EKG 12-Lead - [place], RETROPERITNL ABD,  LTD - POC Hemoccult Bld/Stl  - Urinalysis, Routine w reflex microscopic  - Testosterone - CBC with Differential/Platelet - BASIC METABOLIC PANEL WITH GFR - Hepatic function panel - Magnesium - Lipid panel - TSH - Hemoglobin A1c - Insulin, random - VITAMIN D 25 Hydroxy   2. Essential hypertension  - EKG 12-Lead - [place], RETROPERITNL ABD,  LTD - TSH - Microalbumin / creatinine urine ratio  3. Hyperlipidemia  - Lipid panel - TSH  4. Prediabetes  - Hemoglobin A1c - Insulin, random  5. Vitamin D deficiency  - VITAMIN D 25 Hydroxy   6. Testosterone Deficiency  - Testosterone  7. Other abnormal glucose  - Hemoglobin A1c - Insulin, random  8. Gastroesophageal reflux disease   9. Fibromyalgia   10. BPH    11. BMI 28.0-28.9,adult   12. Erectile dysfunction  - Testosterone  13. Screening for rectal cancer  - POC Hemoccult Bld/Stl  14. Prostate cancer screening   15. Depression screen   16. At low risk for fall   17. Pansinusitis, unspecified chronicity  - predniSONE (DELTASONE) 20 MG tablet; 1 tab 3 x day for 3 days,  then 1 tab 2 x day for 3 days, then 1 tab 1 x day for 5 days  Dispense: 20 tablet; Refill: 0 - azithromycin (ZITHROMAX) 250 MG tablet; Take 2 tablets (500 mg) on  Day 1,  followed by 1 tablet (250 mg) once daily on Days 2 through 5.  Dispense: 6 each; Refill:   18. Need for prophylactic vaccination and inoculation against influenza  - Flu vaccine HIGH DOSE PF (Fluzone High dose)  19. BMI 30.0-30.9,adult   20. Medication management  - Urinalysis, Routine w reflex microscopic  -  CBC with Differential/Platelet - BASIC METABOLIC PANEL WITH GFR - Hepatic function panel - Magnesium - Urinalysis, Routine w reflex microscopic  21. Depression, controlled   Continue prudent diet as discussed, weight control, BP monitoring, regular exercise, and medications as discussed.  Discussed med effects and SE's. Routine screening labs and tests as requested with regular follow-up as recommended. Over 40 minutes of exam, counseling, chart review and high complex critical decision making was performed

## 2015-08-31 NOTE — Patient Instructions (Signed)

## 2015-09-01 LAB — URINALYSIS, ROUTINE W REFLEX MICROSCOPIC
Bilirubin Urine: NEGATIVE
Glucose, UA: NEGATIVE
Hgb urine dipstick: NEGATIVE
Ketones, ur: NEGATIVE
Leukocytes, UA: NEGATIVE
Nitrite: NEGATIVE
Protein, ur: NEGATIVE
Specific Gravity, Urine: 1.013 (ref 1.001–1.035)
pH: 6 (ref 5.0–8.0)

## 2015-09-01 LAB — TSH: TSH: 2.481 u[IU]/mL (ref 0.350–4.500)

## 2015-09-01 LAB — HEMOGLOBIN A1C
Hgb A1c MFr Bld: 5.5 % (ref <5.7)
Mean Plasma Glucose: 111 mg/dL (ref <117)

## 2015-09-01 LAB — CBC WITH DIFFERENTIAL/PLATELET
Basophils Absolute: 0 10*3/uL (ref 0.0–0.1)
Basophils Relative: 0 % (ref 0–1)
Eosinophils Absolute: 0.2 10*3/uL (ref 0.0–0.7)
Eosinophils Relative: 3 % (ref 0–5)
HCT: 52.4 % — ABNORMAL HIGH (ref 39.0–52.0)
Hemoglobin: 18.4 g/dL — ABNORMAL HIGH (ref 13.0–17.0)
Lymphocytes Relative: 21 % (ref 12–46)
Lymphs Abs: 1.7 10*3/uL (ref 0.7–4.0)
MCH: 32.7 pg (ref 26.0–34.0)
MCHC: 35.1 g/dL (ref 30.0–36.0)
MCV: 93.1 fL (ref 78.0–100.0)
MPV: 10.9 fL (ref 8.6–12.4)
Monocytes Absolute: 0.7 10*3/uL (ref 0.1–1.0)
Monocytes Relative: 9 % (ref 3–12)
Neutro Abs: 5.5 10*3/uL (ref 1.7–7.7)
Neutrophils Relative %: 67 % (ref 43–77)
Platelets: 167 10*3/uL (ref 150–400)
RBC: 5.63 MIL/uL (ref 4.22–5.81)
RDW: 13.9 % (ref 11.5–15.5)
WBC: 8.2 10*3/uL (ref 4.0–10.5)

## 2015-09-01 LAB — TESTOSTERONE: Testosterone: 113 ng/dL — ABNORMAL LOW (ref 300–890)

## 2015-09-01 LAB — INSULIN, RANDOM: Insulin: 62.6 u[IU]/mL — ABNORMAL HIGH (ref 2.0–19.6)

## 2015-09-01 LAB — VITAMIN D 25 HYDROXY (VIT D DEFICIENCY, FRACTURES): Vit D, 25-Hydroxy: 38 ng/mL (ref 30–100)

## 2015-09-01 LAB — MICROALBUMIN / CREATININE URINE RATIO
Creatinine, Urine: 90 mg/dL (ref 20–370)
Microalb Creat Ratio: 48 mcg/mg creat — ABNORMAL HIGH (ref <30)
Microalb, Ur: 4.3 mg/dL

## 2015-09-05 ENCOUNTER — Encounter: Payer: Self-pay | Admitting: Internal Medicine

## 2015-09-05 DIAGNOSIS — E669 Obesity, unspecified: Secondary | ICD-10-CM | POA: Insufficient documentation

## 2015-09-05 DIAGNOSIS — Z6829 Body mass index (BMI) 29.0-29.9, adult: Secondary | ICD-10-CM | POA: Insufficient documentation

## 2015-09-05 MED ORDER — RANITIDINE HCL 300 MG PO TABS: ORAL_TABLET | ORAL | Status: DC

## 2015-09-05 MED ORDER — TRAMADOL HCL 50 MG PO TABS: ORAL_TABLET | ORAL | Status: DC

## 2015-09-05 MED ORDER — TESTOSTERONE 20.25 MG/ACT (1.62%) TD GEL: TRANSDERMAL | Status: DC

## 2015-11-30 ENCOUNTER — Encounter: Payer: Self-pay | Admitting: Internal Medicine

## 2015-11-30 ENCOUNTER — Other Ambulatory Visit: Payer: Self-pay | Admitting: Internal Medicine

## 2015-11-30 ENCOUNTER — Ambulatory Visit (INDEPENDENT_AMBULATORY_CARE_PROVIDER_SITE_OTHER): Payer: Medicare Other | Admitting: Internal Medicine

## 2015-11-30 VITALS — BP 124/70 | HR 88 | Temp 98.0°F | Resp 16 | Ht 70.75 in | Wt 213.0 lb

## 2015-11-30 DIAGNOSIS — M797 Fibromyalgia: Secondary | ICD-10-CM | POA: Diagnosis not present

## 2015-11-30 DIAGNOSIS — I10 Essential (primary) hypertension: Secondary | ICD-10-CM

## 2015-11-30 DIAGNOSIS — R7303 Prediabetes: Secondary | ICD-10-CM

## 2015-11-30 DIAGNOSIS — E559 Vitamin D deficiency, unspecified: Secondary | ICD-10-CM | POA: Diagnosis not present

## 2015-11-30 DIAGNOSIS — Z79899 Other long term (current) drug therapy: Secondary | ICD-10-CM | POA: Diagnosis not present

## 2015-11-30 DIAGNOSIS — J309 Allergic rhinitis, unspecified: Secondary | ICD-10-CM

## 2015-11-30 DIAGNOSIS — K219 Gastro-esophageal reflux disease without esophagitis: Secondary | ICD-10-CM | POA: Diagnosis not present

## 2015-11-30 DIAGNOSIS — E785 Hyperlipidemia, unspecified: Secondary | ICD-10-CM

## 2015-11-30 LAB — CBC WITH DIFFERENTIAL/PLATELET
Basophils Absolute: 0 10*3/uL (ref 0.0–0.1)
Basophils Relative: 0 % (ref 0–1)
Eosinophils Absolute: 0.3 10*3/uL (ref 0.0–0.7)
Eosinophils Relative: 5 % (ref 0–5)
HCT: 48.3 % (ref 39.0–52.0)
Hemoglobin: 17.5 g/dL — ABNORMAL HIGH (ref 13.0–17.0)
Lymphocytes Relative: 23 % (ref 12–46)
Lymphs Abs: 1.3 10*3/uL (ref 0.7–4.0)
MCH: 33.5 pg (ref 26.0–34.0)
MCHC: 36.2 g/dL — ABNORMAL HIGH (ref 30.0–36.0)
MCV: 92.5 fL (ref 78.0–100.0)
MPV: 10.7 fL (ref 8.6–12.4)
Monocytes Absolute: 0.7 10*3/uL (ref 0.1–1.0)
Monocytes Relative: 12 % (ref 3–12)
Neutro Abs: 3.4 10*3/uL (ref 1.7–7.7)
Neutrophils Relative %: 60 % (ref 43–77)
Platelets: 163 10*3/uL (ref 150–400)
RBC: 5.22 MIL/uL (ref 4.22–5.81)
RDW: 12.9 % (ref 11.5–15.5)
WBC: 5.6 10*3/uL (ref 4.0–10.5)

## 2015-11-30 LAB — BASIC METABOLIC PANEL WITH GFR
BUN: 15 mg/dL (ref 7–25)
CO2: 26 mmol/L (ref 20–31)
Calcium: 9.1 mg/dL (ref 8.6–10.3)
Chloride: 102 mmol/L (ref 98–110)
Creat: 1.18 mg/dL (ref 0.70–1.25)
GFR, Est African American: 73 mL/min (ref >=60)
GFR, Est Non African American: 63 mL/min (ref >=60)
Glucose, Bld: 94 mg/dL (ref 65–99)
Potassium: 4.1 mmol/L (ref 3.5–5.3)
Sodium: 138 mmol/L (ref 135–146)

## 2015-11-30 LAB — LIPID PANEL
Cholesterol: 225 mg/dL — ABNORMAL HIGH (ref 125–200)
HDL: 44 mg/dL (ref >=40)
LDL Cholesterol: 122 mg/dL (ref <130)
Total CHOL/HDL Ratio: 5.1 Ratio — ABNORMAL HIGH (ref <=5.0)
Triglycerides: 294 mg/dL — ABNORMAL HIGH (ref <150)
VLDL: 59 mg/dL — ABNORMAL HIGH (ref <30)

## 2015-11-30 LAB — HEPATIC FUNCTION PANEL
ALT: 31 U/L (ref 9–46)
AST: 20 U/L (ref 10–35)
Albumin: 4 g/dL (ref 3.6–5.1)
Alkaline Phosphatase: 56 U/L (ref 40–115)
Bilirubin, Direct: 0.1 mg/dL (ref <=0.2)
Indirect Bilirubin: 0.6 mg/dL (ref 0.2–1.2)
Total Bilirubin: 0.7 mg/dL (ref 0.2–1.2)
Total Protein: 6.2 g/dL (ref 6.1–8.1)

## 2015-11-30 LAB — TSH: TSH: 2.92 mIU/L (ref 0.40–4.50)

## 2015-11-30 MED ORDER — PREDNISONE 20 MG PO TABS: ORAL_TABLET | ORAL | Status: DC

## 2015-11-30 MED ORDER — CETIRIZINE HCL 10 MG PO TABS: 10.0000 mg | ORAL_TABLET | Freq: Every day | ORAL | Status: DC

## 2015-11-30 MED ORDER — RANITIDINE HCL 300 MG PO TABS: ORAL_TABLET | ORAL | Status: DC

## 2015-11-30 MED ORDER — DESOXIMETASONE 0.25 % EX CREA
1.0000 | TOPICAL_CREAM | Freq: Two times a day (BID) | CUTANEOUS | Status: DC
Start: 2015-11-30 — End: 2018-01-10

## 2015-11-30 MED ORDER — TRAMADOL HCL 50 MG PO TABS: ORAL_TABLET | ORAL | Status: DC

## 2015-11-30 MED ORDER — VALSARTAN 320 MG PO TABS: 320.0000 mg | ORAL_TABLET | Freq: Every day | ORAL | Status: DC

## 2015-11-30 NOTE — Patient Instructions (Signed)
Please start using cetirizine 10 mg daily at bedtime.  Please get a 30 day supply at the store.  [facility]  Please use flonase 2 sprays per nostril every night before bedtime.  Please use saline as often as tolerated.

## 2015-11-30 NOTE — Progress Notes (Signed)
Assessment and Plan:  Hypertension:  -Continue medication,  -monitor blood pressure at home.  -Continue DASH diet.   -Reminder to go to the ER if any CP, SOB, nausea, dizziness, severe HA, changes vision/speech, left arm numbness and tingling, and jaw pain.  Cholesterol: -Continue diet and exercise.  -Check cholesterol.   Pre-diabetes: -Continue diet and exercise.   Vitamin D Def: -continue medications.   Allergic rhinitis -prednisone -flonase -zyrtec -nasal saline  Continue diet and meds as discussed. Further disposition pending results of labs.  HPI 70 y.o. male  presents for 3 month follow up with hypertension, hyperlipidemia, prediabetes and vitamin D.   His blood pressure has been controlled at home, today their BP is BP: 124/70 mmHg.   He does not workout. He denies chest pain, shortness of breath, dizziness.  He is not currently doing any form of exercise.     He is on cholesterol medication and denies myalgias. His cholesterol is at goal. The cholesterol last visit was:   Lab Results  Component Value Date   CHOL 202* 08/31/2015   HDL 54 08/31/2015   LDLCALC 71 08/31/2015   LDLDIRECT 151.0 02/10/2010   TRIG 384* 08/31/2015   CHOLHDL 3.7 08/31/2015     He has been working on diet and exercise for prediabetes, and denies foot ulcerations, hyperglycemia, hypoglycemia , increased appetite, nausea, paresthesia of the feet, polydipsia, polyuria, visual disturbances, vomiting and weight loss. Last A1C in the office was:  Lab Results  Component Value Date   HGBA1C 5.5 08/31/2015    Patient is on Vitamin D supplement.  Lab Results  Component Value Date   VD25OH 38 08/31/2015     Patient reports that he is having a lot of congestion, runny nose, swimmy headed and dizzy at times.  He is taking sudafed for it currently and alkaseltzer plus.  This has been going on for 3-4 weeks.  He reports that he gets better sometimes and then it goes right back to running.      Current Medications:  Current Outpatient Prescriptions on File Prior to Visit  Medication Sig Dispense Refill  . aspirin (ASPIR-LOW) 81 MG EC tablet Take 81 mg by mouth daily.      Marland Kitchen buPROPion (WELLBUTRIN XL) 300 MG 24 hr tablet TAKE 1 TABLET DAILY FOR MOOD 90 tablet 3  . fish oil-omega-3 fatty acids 1000 MG capsule Take 1 capsule by mouth 2 (two) times daily.      . Magnesium 250 MG TABS Take 1 tablet by mouth 2 (two) times daily.      . Multiple Vitamins-Minerals (STRESS B COMPLEX/ZINC) TABS Take 1 tablet by mouth daily.      . pantoprazole (PROTONIX) 40 MG tablet Take 1 tablet daily for acid reflux 90 tablet 99  . ranitidine (ZANTAC) 300 MG tablet Take 1 tablet 2 x d ay to allow taper off Nexium for heartburn 180 tablet 1  . Testosterone (ANDROGEL PUMP) 20.25 MG/ACT (1.62%) GEL Apply 4 pumps daily  - disp 6 bottles (90 day supply) with refills for 6 months 450 g 1  . traMADol (ULTRAM) 50 MG tablet 1 tablet 2 x day for pain 180 tablet 1   No current facility-administered medications on file prior to visit.    Medical History:  Past Medical History  Diagnosis Date  . BPH (benign prostatic hypertrophy)   . GERD (gastroesophageal reflux disease)   . Fibromyalgia   . Hypogonadism male   . History of epididymitis   .  Myofascial pain syndrome   . Nephrolithiasis   . Prostatitis     Recurrent  . Prolapsed internal hemorrhoids   . History of head injury without skull fracture 1980    Concussion  . Tendinitis 2000    Hx of left bicep  . Rheumatic fever     x3 at ages [age] and 2; No significant valvular abnormality on 5/11 echo  . Exertional dyspnea     ETT myoview 5/11: 8 min, no ischemic ECG changes, stopped due to fatigue/ SOB. EF 60%, no ischemia or infarction. Echo 5/11 EF 60%, no significant valvular abnormalities, mild RV dilation and dysfunction, no complete TR doppler jet so PA pressure hard to gauge. 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 R  heart cath 6/11: mean RA 10 mmHg, PA 25/18, mean PCWP 13 mmHg, CI 2.1  . Hypertension   . Hyperlipidemia     Myalgias with Lipitor  . Vitamin D deficiency   . 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 Erythrocytosis 04/28/2014  . Hx of adenomatous polyp of colon 06/19/2007    Allergies:  Allergies  Allergen Reactions  . Atenolol   . Atorvastatin   . Darvocet [Propoxyphene N-Acetaminophen]   . Paroxetine   . Pregabalin   . Propoxyphene Hcl      Review of Systems:  Review of Systems  Constitutional: Positive for malaise/fatigue. Negative for fever and chills.  HENT: Positive for congestion and sore throat. Negative for ear pain.   Eyes: Negative.   Respiratory: Negative for cough, shortness of breath and wheezing.   Cardiovascular: Negative for chest pain, palpitations and leg swelling.  Gastrointestinal: Negative for heartburn, diarrhea, constipation, blood in stool and melena.  Genitourinary: Positive for urgency. Negative for dysuria, frequency and hematuria.  Musculoskeletal: Positive for joint pain.  Neurological: Positive for headaches. Negative for dizziness, sensory change and loss of consciousness.  Psychiatric/Behavioral: Negative for depression. The patient is not nervous/anxious and does not have insomnia.     Family history- Review and unchanged  Social history- Review and unchanged  Physical Exam: BP 124/70 mmHg  Pulse 88  Temp(Src) 98 F (36.7 C) (Temporal)  Resp 16  Ht 5' 10.75" (1.797 m)  Wt 213 lb (96.616 kg)  BMI 29.92 kg/m2 Wt Readings from Last 3 Encounters:  11/30/15 213 lb (96.616 kg)  08/31/15 209 lb 3.2 oz (94.892 kg)  06/09/15 204 lb (92.534 kg)    General Appearance: Well nourished well developed, in no apparent distress. Eyes: PERRLA, EOMs, conjunctiva no swelling or erythema ENT/Mouth: Ear canals normal without obstruction, swelling, erythma, discharge.  TMs normal bilaterally.  Oropharynx moist, clear, without exudate,  or postoropharyngeal swelling. Neck: Supple, thyroid normal,no cervical adenopathy  Respiratory: Respiratory effort normal, Breath sounds clear A&P without rhonchi, wheeze, or rale.  No retractions, no accessory usage. Cardio: RRR with no MRGs. Brisk peripheral pulses without edema.  Abdomen: Soft, + BS,  Non tender, no guarding, rebound, hernias, masses. Musculoskeletal: Full ROM, 5/5 strength, Normal gait Skin: Warm, dry without rashes, lesions, ecchymosis.  Neuro: Awake and oriented X 3, Cranial nerves intact. Normal muscle tone, no cerebellar symptoms. Psych: Normal affect, Insight and Judgment appropriate.    Starlyn Skeans, PA-C 9:49 AM [facility]

## 2015-11-30 NOTE — Addendum Note (Signed)
Addended by: Camila Norville A on: 11/30/2015 11:33 AM   Modules accepted: Orders

## 2015-12-01 LAB — HEMOGLOBIN A1C
Hgb A1c MFr Bld: 5.4 % (ref <5.7)
Mean Plasma Glucose: 108 mg/dL (ref <117)

## 2016-02-22 ENCOUNTER — Ambulatory Visit (INDEPENDENT_AMBULATORY_CARE_PROVIDER_SITE_OTHER): Payer: Medicare Other | Admitting: Physician Assistant

## 2016-02-22 ENCOUNTER — Encounter: Payer: Self-pay | Admitting: Physician Assistant

## 2016-02-22 VITALS — BP 140/82 | HR 105 | Temp 97.9°F | Resp 16 | Ht 70.75 in | Wt 203.2 lb

## 2016-02-22 DIAGNOSIS — Z86010 Personal history of colonic polyps: Secondary | ICD-10-CM | POA: Diagnosis not present

## 2016-02-22 DIAGNOSIS — K219 Gastro-esophageal reflux disease without esophagitis: Secondary | ICD-10-CM | POA: Diagnosis not present

## 2016-02-22 DIAGNOSIS — K4090 Unilateral inguinal hernia, without obstruction or gangrene, not specified as recurrent: Secondary | ICD-10-CM

## 2016-02-22 DIAGNOSIS — R7303 Prediabetes: Secondary | ICD-10-CM | POA: Diagnosis not present

## 2016-02-22 DIAGNOSIS — N529 Male erectile dysfunction, unspecified: Secondary | ICD-10-CM

## 2016-02-22 DIAGNOSIS — R768 Other specified abnormal immunological findings in serum: Secondary | ICD-10-CM | POA: Diagnosis not present

## 2016-02-22 DIAGNOSIS — R6889 Other general symptoms and signs: Secondary | ICD-10-CM | POA: Diagnosis not present

## 2016-02-22 DIAGNOSIS — M797 Fibromyalgia: Secondary | ICD-10-CM | POA: Diagnosis not present

## 2016-02-22 DIAGNOSIS — E559 Vitamin D deficiency, unspecified: Secondary | ICD-10-CM | POA: Diagnosis not present

## 2016-02-22 DIAGNOSIS — N400 Enlarged prostate without lower urinary tract symptoms: Secondary | ICD-10-CM

## 2016-02-22 DIAGNOSIS — Z79899 Other long term (current) drug therapy: Secondary | ICD-10-CM

## 2016-02-22 DIAGNOSIS — E785 Hyperlipidemia, unspecified: Secondary | ICD-10-CM | POA: Diagnosis not present

## 2016-02-22 DIAGNOSIS — Z0000 Encounter for general adult medical examination without abnormal findings: Secondary | ICD-10-CM

## 2016-02-22 DIAGNOSIS — R5383 Other fatigue: Secondary | ICD-10-CM

## 2016-02-22 DIAGNOSIS — Z0001 Encounter for general adult medical examination with abnormal findings: Secondary | ICD-10-CM | POA: Diagnosis not present

## 2016-02-22 DIAGNOSIS — E291 Testicular hypofunction: Secondary | ICD-10-CM | POA: Diagnosis not present

## 2016-02-22 DIAGNOSIS — I10 Essential (primary) hypertension: Secondary | ICD-10-CM | POA: Diagnosis not present

## 2016-02-22 NOTE — Progress Notes (Signed)
Patient ID: Curtis Price., male   DOB: 02/24/1946, 70 y.o.   MRN: [id number]  MEDICARE ANNUAL WELLNESS VISIT AND OV  Assessment:   1. Essential hypertension  - TSH  2. Prediabetes  - Hemoglobin A1c - Insulin, random  3. Hyperlipidemia  - Lipid panel  4. Gastroesophageal reflux disease, esophagitis presence not specified  - Vit D  25 hydroxy (rtn osteoporosis monitoring)  5. Testosterone Deficiency  - Testosterone  6. Medication management  - CBC with Differential/Platelet - BASIC METABOLIC PANEL WITH GFR - Hepatic function panel - Magnesium  7. Vitamin D deficiency  8. Elevated serum immunoglobulin free light chains - CBC with Differential/Platelet  9. BPH (benign prostatic hypertrophy) - Urinalysis, Routine w reflex microscopic (not at [facility]) - Urine culture  10. Erectile dysfunction, unspecified erectile dysfunction type  11. Fibromyalgia ? Causing fatigue, check labs  12. Hx of adenomatous polyp of colon Up to date  10. Encounter for Medicare annual wellness exam  14. Right inguinal hernia Referral Dr. Marlou Starks - Urinalysis, Routine w reflex microscopic (not at [facility]) - Urine culture  15. Other fatigue - TSH - Iron and TIBC - Ferritin - Vitamin B12 - Rocky mtn spotted fvr abs pnl(IgG+IgM) - Lyme Aby, Wstrn. Blt. IgG & IgM w/bands   Plan:   During the course of the visit the patient was educated and counseled about appropriate screening and preventive services including:    Pneumococcal vaccine   Influenza vaccine  Td vaccine  Screening electrocardiogram  Bone densitometry screening  Colorectal cancer screening  Diabetes screening  Glaucoma screening  Nutrition counseling   Advanced directives: requested   Conditions/risks identified: BMI: Discussed weight loss, diet, and increase physical activity.  Increase physical activity: AHA recommends 150 minutes of physical activity a week.  Medications reviewed PreDiabetes is at  goal, ACE/ARB therapy: Yes. Urinary Incontinence is not an issue: discussed non pharmacology and pharmacology options.  Fall risk: low- discussed PT, home fall assessment, medications.   Subjective:  Curtis Price.  presents for [facility] Annual Wellness Visit and OV.  Date of last medicare wellness visit was 01/2015/.   States last Wednesday he was pulling on a ladder and did not feel any pain at that time. Thursday he woke up and right lower groin/AB burning/dull achy pain, progressively worse Friday, Saturday, and Sunday worse. Took aleve Sunday and has been having some improvement. Last BM today normal with no blood, no fever, chills.  Had hernia repair with Dr. Marlou Starks, tried to get appointment in the 5th.   He states that he has severe fatigue, will get up at 7 and want to go back to bed at 9:30AM. No CP, SOB, abnormal palptiations, denies snoring/sleep apnea.   Patient is treated for HTN since 1994 & BP has been controlled at home. BP: 140/82 mmHg . Patient had a negative Myoview & Normal Heart Cath in 2011. Patient has had no complaints of any cardiac type chest pain, palpitations, dyspnea/orthopnea/PND, dizziness, claudication, or dependent edema.  Hyperlipidemia is controlled with diet & meds. Patient denies myalgias or other med SE's. Last Lipids were at goal - Lab Results  Component Value Date   CHOL 225* 11/30/2015   HDL 44 11/30/2015   LDLCALC 122 11/30/2015   LDLDIRECT 151.0 02/10/2010   TRIG 294* 11/30/2015   CHOLHDL 5.1* 11/30/2015   Also, the patient is screened for PreDiabetes and has had no symptoms of reactive hypoglycemia, diabetic polys, paresthesias or visual blurring.  Lab Results  Component Value Date   HGBA1C 5.4 11/30/2015    Further, the patient also has history of Vitamin D Deficiency and supplements vitamin D without any suspected side-effects.  Lab Results  Component Value Date   VD25OH 38 08/31/2015   BMI is Body mass index is 28.54 kg/(m^2)., he is  working on diet and exercise. Wt Readings from Last 3 Encounters:  02/22/16 203 lb 3.2 oz (92.171 kg)  11/30/15 213 lb (96.616 kg)  08/31/15 209 lb 3.2 oz (94.892 kg)   Names of Other Physician/Practitioners you currently use: 1. [facility] here for primary care 2. [employer], glasses, eye doctor, last visit 09/2015 3. Dr Shanon Brow Spong,DDS, dentist, last visit 2017/every 6 months  Patient Care Team: [name], MD as PCP - General (Internal Medicine) Larey Dresser, MD as Consulting Physician (Cardiology) Sable Feil, MD as Consulting Physician (Gastroenterology) Heath Lark, MD as Consulting Physician (Hematology and Oncology) Jolaine Artist, MD as Consulting Physician (Cardiology)  Medication Review:   Medication List       This list is accurate as of: 02/22/16  3:16 PM.  Always use your most recent med list.               ASPIR-LOW 81 MG EC tablet  Generic drug:  aspirin  Take 81 mg by mouth daily.     buPROPion 300 MG 24 hr tablet  Commonly known as:  WELLBUTRIN XL  TAKE 1 TABLET DAILY FOR MOOD     cetirizine 10 MG tablet  Commonly known as:  ZYRTEC  Take 1 tablet (10 mg total) by mouth at bedtime.     desoximetasone 0.25 % cream  Commonly known as:  TOPICORT  Apply 1 application topically 2 (two) times daily.     fish oil-omega-3 fatty acids 1000 MG capsule  Take 1 capsule by mouth 2 (two) times daily.     Magnesium 250 MG Tabs  Take 1 tablet by mouth 2 (two) times daily.     pantoprazole 40 MG tablet  Commonly known as:  PROTONIX  Take 1 tablet daily for acid reflux     ranitidine 300 MG tablet  Commonly known as:  ZANTAC  Take 1 tablet 2 x d ay to allow taper off Nexium for heartburn     STRESS B COMPLEX/ZINC Tabs  Take 1 tablet by mouth daily.     Testosterone 20.25 MG/ACT (1.62%) Gel  Commonly known as:  ANDROGEL PUMP  Apply 4 pumps daily  - disp 6 bottles (90 day supply) with  refills for 6 months     traMADol 50 MG tablet  Commonly known as:  ULTRAM  1 tablet 2 x day for pain     valsartan 320 MG tablet  Commonly known as:  DIOVAN  Take 1 tablet (320 mg total) by mouth daily.        Current Problems (verified) Patient Active Problem List   Diagnosis Date Noted  . BMI 30.0-30.9,adult 09/05/2015  . Other abnormal glucose 08/31/2015  . Erectile dysfunction 08/31/2015  . Encounter for Medicare annual wellness exam 05/28/2015  . 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 Prediabetes 01/26/2014  . Medication management 11/19/2013  . BPH (benign prostatic hypertrophy)   . GERD   . Fibromyalgia   . Testosterone Deficiency   . Hyperlipidemia   . Vitamin D deficiency   . Essential hypertension 02/15/2010  . Hx of adenomatous polyp of colon 06/19/2007  Screening Tests Health Maintenance  Topic Date Due  . Hepatitis C Screening  10-31-1945  . INFLUENZA VACCINE  04/25/2016  . TETANUS/TDAP  04/26/2019  . COLONOSCOPY  06/08/2020  . ZOSTAVAX  Completed  . PNA vac Low Risk Adult  Completed   Immunization History  Administered Date(s) Administered  . Influenza, High Dose Seasonal PF 08/13/2014, 08/31/2015  . Pneumococcal Conjugate-13 08/13/2014  . Pneumococcal Polysaccharide-23 07/12/2012  . Tdap 04/25/2009  . Zoster 07/27/2007   Preventative care: Last colonoscopy: 2016 Dr. Carlean Purl  History reviewed: allergies, current medications, past family history, past medical history, past social history, past surgical history and problem list  Risk Factors: Tobacco Social History  Substance Use Topics  . Smoking status: Never Smoker   . Smokeless tobacco: Never Used  . Alcohol Use: 7.2 oz/week    12 Cans of beer per week     Comment: beer and liquoer   He does not smoke.  Patient is not a former smoker. Are there smokers in your home (other than you)?  No  Alcohol Current alcohol use: 1 beers per day(s), 1 liquor drinks per  day(s)  Caffeine Current caffeine use: coffee 0-1 cups /day  Exercise Current exercise: gardening and yard work  Nutrition/Diet Current diet: in general, a "healthy" diet    Cardiac risk factors: advanced age (older than 64 for men, 24 for women), dyslipidemia, hypertension, male gender and sedentary lifestyle.  Depression Screen (Note: if answer to either of the following is "Yes", a more complete depression screening is indicated)   Q1: Over the past two weeks, have you felt down, depressed or hopeless? No  Q2: Over the past two weeks, have you felt little interest or pleasure in doing things? No  Have you lost interest or pleasure in daily life? No  Do you often feel hopeless? No  Do you cry easily over simple problems? No  Activities of Daily Living In your present state of health, do you have any difficulty performing the following activities?:  Driving? No Managing money?  No Feeding yourself? No Getting from bed to chair? No Climbing a flight of stairs? No Preparing food and eating?: No Bathing or showering? No Getting dressed: No Getting to the toilet? No Using the toilet:No Moving around from place to place: No In the past year have you fallen or had a near fall?:No  Vision Difficulties: No  Hearing Difficulties: No Do you often ask people to speak up or repeat themselves? No Do you experience ringing or noises in your ears? No Do you have difficulty understanding soft or whispered voices? No  Cognition  Do you feel that you have a problem with memory?No  Do you often misplace items? No  Do you feel safe at home?  Yes  Advanced directives Does patient have a [place]? Yes Does patient have a Living Will? Yes  Allergies  Allergen Reactions  . Atenolol   . Atorvastatin   . Darvocet [Propoxyphene N-Acetaminophen]   . Paroxetine   . Pregabalin   . Propoxyphene Hcl     Family History  Problem Relation Age of Onset  .  Hypertension Mother   . Breast cancer Mother   . Aortic stenosis Father   . Stroke Neg Hx   . Colon cancer Neg Hx   . Colon polyps Neg Hx   . Hypertension Other   . Aortic stenosis Other   . Breast cancer Other   . Diabetes Other   .  Other Other     ASHD and epilepsy    Past Surgical History  Procedure Laterality Date  . Cardiac catheterization  02/2010    Left and right  . Heel spur surgery  1960    Bilateral  . Tonsillectomy  1962  . Finger surgery  2000    Repair of injury to right fifth finger  . Bih repair  08/2005    Incarverated fat by mesh repair    Objective:     Blood pressure 140/82, pulse 105, temperature 97.9 F (36.6 C), temperature source Temporal, resp. rate 16, height 5' 10.75" (1.797 m), weight 203 lb 3.2 oz (92.171 kg), SpO2 97 %.   General Appearance:  Alert  WD/WN, male  in no apparent distress. Eyes: PERRLA, EOMs nl, conjunctiva normal, normal fundi and vessels. Sinuses: Sl bilat.  frontal/maxillary tenderness ENT/Mouth: EACs patent / Lt. TM  Nl. & Rt TM dull pink w/splayed reflex.  Nares clear without erythema, swelling, mucoid exudates. Oral hygiene is good. 2+ erythema, w/o exudate. Tongue normal, non-obstructing. Tonsils not swollen or erythematous. Hearing normal.  Neck: Supple, thyroid normal. No bruits, nodes or JVD. Respiratory: Respiratory effort normal.  BS equal and clear bilateral without rales, rhonci, wheezing or stridor. Cardio: Heart sounds are normal with regular rate and rhythm and no murmurs, rubs or gallops. Peripheral pulses are normal and equal bilaterally without edema. No aortic or femoral bruits. Chest: symmetric with normal excursions and percussion.  Abdomen: Distended, firm, soft, with nl bowel sounds, + bulging retractable hernia just superior to well healed surgical scar from previous hernia. Nontender, no guarding, rebound, hernias, masses, or organomegaly.  Lymphatics: Non tender without lymphadenopathy.   Musculoskeletal: Full ROM all peripheral extremities, joint stability, 5/5 strength, and normal gait. Skin: Warm and dry without rashes, lesions, cyanosis, clubbing or  ecchymosis.  Neuro: Cranial nerves intact, reflexes equal bilaterally. Normal muscle tone, no cerebellar symptoms. Sensation intact.  Pysch: Alert and oriented X 3 with normal affect, insight and judgment appropriate.   Cognitive Testing  Alert? Yes  Normal Appearance? Yes  Oriented to person? Yes  Place? Yes   Time? Yes  Recall of three objects?  Yes  Can perform simple calculations? Yes  Displays appropriate judgment? Yes  Can read the correct time from a watch/clock? Yes  Medicare Attestation I have personally reviewed: The patient's medical and social history Their use of alcohol, tobacco or illicit drugs Their current medications and supplements The patient's functional ability including ADLs,fall risks, home safety risks, cognitive, and hearing and visual impairment Diet and physical activities Evidence for depression or mood disorders  The patient's weight, height, BMI, and visual acuity have been recorded in the chart.  I have made referrals, counseling, and provided education to the patient based on review of the above and I have provided the patient with a written personalized care plan for preventive services.  Over 40 minutes of exam, counseling, chart review was performed.  Vicie Mutters, PA-C   02/22/2016

## 2016-02-22 NOTE — Patient Instructions (Signed)
Fatigue Fatigue is feeling tired all of the time, a lack of energy, or a lack of motivation. Occasional or mild fatigue is often a normal response to activity or life in general. However, long-lasting (chronic) or extreme fatigue may indicate an underlying medical condition. HOME CARE INSTRUCTIONS  Watch your fatigue for any changes. The following actions may help to lessen any discomfort you are feeling:  Talk to your health care provider about how much sleep you need each night. Try to get the required amount every night.  Take medicines only as directed by your health care provider.  Eat a healthy and nutritious diet. Ask your health care provider if you need help changing your diet.  Drink enough fluid to keep your urine clear or pale yellow.  Practice ways of relaxing, such as yoga, meditation, massage therapy, or acupuncture.  Exercise regularly.   Change situations that cause you stress. Try to keep your work and personal routine reasonable.  Do not abuse illegal drugs.  Limit alcohol intake to no more than 1 drink per day for nonpregnant women and 2 drinks per day for men. One drink equals 12 ounces of beer, 5 ounces of wine, or 1 ounces of hard liquor.  Take a multivitamin, if directed by your health care provider. SEEK MEDICAL CARE IF:   Your fatigue does not get better.  You have a fever.   You have unintentional weight loss or gain.  You have headaches.   You have difficulty:   Falling asleep.  Sleeping throughout the night.  You feel angry, guilty, anxious, or sad.   You are unable to have a bowel movement (constipation).   You skin is dry.   Your legs or another part of your body is swollen.  SEEK IMMEDIATE MEDICAL CARE IF:   You feel confused.   Your vision is blurry.  You feel faint or pass out.   You have a severe headache.   You have severe abdominal, pelvic, or back pain.   You have chest pain, shortness of breath, or an  irregular or fast heartbeat.   You are unable to urinate or you urinate less than normal.   You develop abnormal bleeding, such as bleeding from the rectum, vagina, nose, lungs, or nipples.  You vomit blood.   You have thoughts about harming yourself or committing suicide.   You are worried that you might harm someone else.    This information is not intended to replace advice given to you by your health care provider. Make sure you discuss any questions you have with your health care provider.   Document Released: 07/09/2007 Document Revised: 10/02/2014 Document Reviewed: 01/13/2014 Elsevier Interactive Patient Education 2016 [place], Adult A hernia is the bulging of an organ or tissue through a weak spot in the muscles of the abdomen (abdominal wall). Hernias develop most often near the navel or groin. There are many kinds of hernias. Common kinds include:  Femoral hernia. This kind of hernia develops under the groin in the upper thigh area.  Inguinal hernia. This kind of hernia develops in the groin or scrotum.  Umbilical hernia. This kind of hernia develops near the navel.  Hiatal hernia. This kind of hernia causes part of the stomach to be pushed up into the chest.  Incisional hernia. This kind of hernia bulges through a scar from an abdominal surgery. CAUSES This condition may be caused by:  Heavy lifting.  Coughing over a long period of time.  Straining to have a bowel movement.  An incision made during an abdominal surgery.  A birth defect (congenital defect).  Excess weight or obesity.  Smoking.  Poor nutrition.  Cystic fibrosis.  Excess fluid in the abdomen.  Undescended testicles. SYMPTOMS Symptoms of a hernia include:  A lump on the abdomen. This is the first sign of a hernia. The lump may become more obvious with standing, straining, or coughing. It may get bigger over time if it is not treated or if the condition causing it is  not treated.  Pain. A hernia is usually painless, but it may become painful over time if treatment is delayed. The pain is usually dull and may get worse with standing or lifting heavy objects. Sometimes a hernia gets tightly squeezed in the weak spot (strangulated) or stuck there (incarcerated) and causes additional symptoms. These symptoms may include:  Vomiting.  Nausea.  Constipation.  Irritability. DIAGNOSIS A hernia may be diagnosed with:  A physical exam. During the exam your health care provider may ask you to cough or to make a specific movement, because a hernia is usually more visible when you move.  Imaging tests. These can include:  X-rays.  Ultrasound.  CT scan. TREATMENT A hernia that is small and painless may not need to be treated. A hernia that is large or painful may be treated with surgery. Inguinal hernias may be treated with surgery to prevent incarceration or strangulation. Strangulated hernias are always treated with surgery, because lack of blood to the trapped organ or tissue can cause it to die. Surgery to treat a hernia involves pushing the bulge back into place and repairing the weak part of the abdomen. HOME CARE INSTRUCTIONS  Avoid straining.  Do not lift anything heavier than 10 lb (4.5 kg).  Lift with your leg muscles, not your back muscles. This helps avoid strain.  When coughing, try to cough gently.  Prevent constipation. Constipation leads to straining with bowel movements, which can make a hernia worse or cause a hernia repair to break down. You can prevent constipation by:  Eating a high-fiber diet that includes plenty of fruits and vegetables.  Drinking enough fluids to keep your urine clear or pale yellow. Aim to drink 6-8 glasses of water per day.  Using a stool softener as directed by your health care provider.  Lose weight, if you are overweight.  Do not use any tobacco products, including cigarettes, chewing tobacco, or  electronic cigarettes. If you need help quitting, ask your health care provider.  Keep all follow-up visits as directed by your health care provider. This is important. Your health care provider may need to monitor your condition. SEEK MEDICAL CARE IF:  You have swelling, redness, and pain in the affected area.  Your bowel habits change. SEEK IMMEDIATE MEDICAL CARE IF:  You have a fever.  You have abdominal pain that is getting worse.  You feel nauseous or you vomit.  You cannot push the hernia back in place by gently pressing on it while you are lying down.  The hernia:  Changes in shape or size.  Is stuck outside the abdomen.  Becomes discolored.  Feels hard or tender.   This information is not intended to replace advice given to you by your health care provider. Make sure you discuss any questions you have with your health care provider.   Document Released: 09/11/2005 Document Revised: 10/02/2014 Document Reviewed: 07/22/2014 Elsevier Interactive Patient Education [employer].

## 2016-02-23 LAB — CBC WITH DIFFERENTIAL/PLATELET
Basophils Absolute: 0 cells/uL (ref 0–200)
Basophils Relative: 0 %
Eosinophils Absolute: 384 cells/uL (ref 15–500)
Eosinophils Relative: 3 %
HCT: 46.1 % (ref 38.5–50.0)
Hemoglobin: 15.9 g/dL (ref 13.2–17.1)
Lymphocytes Relative: 8 %
Lymphs Abs: 1024 cells/uL (ref 850–3900)
MCH: 31.7 pg (ref 27.0–33.0)
MCHC: 34.5 g/dL (ref 32.0–36.0)
MCV: 91.8 fL (ref 80.0–100.0)
MPV: 10.7 fL (ref 7.5–12.5)
Monocytes Absolute: 1664 cells/uL — ABNORMAL HIGH (ref 200–950)
Monocytes Relative: 13 %
Neutro Abs: 9728 cells/uL — ABNORMAL HIGH (ref 1500–7800)
Neutrophils Relative %: 76 %
Platelets: 215 10*3/uL (ref 140–400)
RBC: 5.02 MIL/uL (ref 4.20–5.80)
RDW: 13.3 % (ref 11.0–15.0)
WBC: 12.8 10*3/uL — ABNORMAL HIGH (ref 3.8–10.8)

## 2016-02-23 LAB — URINALYSIS, ROUTINE W REFLEX MICROSCOPIC
Bilirubin Urine: NEGATIVE
Glucose, UA: NEGATIVE
Hgb urine dipstick: NEGATIVE
Ketones, ur: NEGATIVE
Leukocytes, UA: NEGATIVE
Nitrite: NEGATIVE
Protein, ur: NEGATIVE
Specific Gravity, Urine: 1.022 (ref 1.001–1.035)
pH: 6 (ref 5.0–8.0)

## 2016-02-23 LAB — IRON AND TIBC
%SAT: 7 % — ABNORMAL LOW (ref 15–60)
Iron: 18 ug/dL — ABNORMAL LOW (ref 50–180)
TIBC: 259 ug/dL (ref 250–425)
UIBC: 241 ug/dL (ref 125–400)

## 2016-02-23 LAB — FERRITIN: Ferritin: 312 ng/mL (ref 20–380)

## 2016-02-23 LAB — HEPATIC FUNCTION PANEL
ALT: 19 U/L (ref 9–46)
AST: 15 U/L (ref 10–35)
Albumin: 3.7 g/dL (ref 3.6–5.1)
Alkaline Phosphatase: 61 U/L (ref 40–115)
Bilirubin, Direct: 0.2 mg/dL (ref <=0.2)
Indirect Bilirubin: 0.5 mg/dL (ref 0.2–1.2)
Total Bilirubin: 0.7 mg/dL (ref 0.2–1.2)
Total Protein: 6 g/dL — ABNORMAL LOW (ref 6.1–8.1)

## 2016-02-23 LAB — URINE CULTURE
Colony Count: NO GROWTH
Organism ID, Bacteria: NO GROWTH

## 2016-02-23 LAB — BASIC METABOLIC PANEL WITH GFR
BUN: 19 mg/dL (ref 7–25)
CO2: 22 mmol/L (ref 20–31)
Calcium: 8.7 mg/dL (ref 8.6–10.3)
Chloride: 103 mmol/L (ref 98–110)
Creat: 1.18 mg/dL (ref 0.70–1.25)
GFR, Est African American: 73 mL/min (ref >=60)
GFR, Est Non African American: 63 mL/min (ref >=60)
Glucose, Bld: 102 mg/dL — ABNORMAL HIGH (ref 65–99)
Potassium: 4.3 mmol/L (ref 3.5–5.3)
Sodium: 139 mmol/L (ref 135–146)

## 2016-02-23 LAB — VITAMIN B12: Vitamin B-12: 873 pg/mL (ref 200–1100)

## 2016-02-23 LAB — TSH: TSH: 3.53 mIU/L (ref 0.40–4.50)

## 2016-02-24 LAB — LYME ABY, WSTRN BLT IGG & IGM W/BANDS

## 2016-02-24 LAB — ROCKY MTN SPOTTED FVR ABS PNL(IGG+IGM)
RMSF IgG: NOT DETECTED
RMSF IgM: NOT DETECTED

## 2016-03-09 ENCOUNTER — Ambulatory Visit (INDEPENDENT_AMBULATORY_CARE_PROVIDER_SITE_OTHER): Payer: Medicare Other | Admitting: Internal Medicine

## 2016-03-09 ENCOUNTER — Encounter: Payer: Self-pay | Admitting: Internal Medicine

## 2016-03-09 VITALS — BP 120/82 | HR 64 | Temp 97.5°F | Resp 16 | Ht 70.0 in | Wt 201.0 lb

## 2016-03-09 DIAGNOSIS — Z79899 Other long term (current) drug therapy: Secondary | ICD-10-CM

## 2016-03-09 DIAGNOSIS — R7309 Other abnormal glucose: Secondary | ICD-10-CM

## 2016-03-09 DIAGNOSIS — E785 Hyperlipidemia, unspecified: Secondary | ICD-10-CM

## 2016-03-09 DIAGNOSIS — E559 Vitamin D deficiency, unspecified: Secondary | ICD-10-CM | POA: Diagnosis not present

## 2016-03-09 DIAGNOSIS — I10 Essential (primary) hypertension: Secondary | ICD-10-CM

## 2016-03-09 LAB — TSH: TSH: 3.25 mIU/L (ref 0.40–4.50)

## 2016-03-09 NOTE — Patient Instructions (Signed)

## 2016-03-09 NOTE — Progress Notes (Signed)
Patient ID: Curtis Price., male   DOB: 26-Jul-1946, 70 y.o.   MRN: [id number]  [facility] ADULT & ADOLESCENT INTERNAL MEDICINE                       Curtis Price, M.D.        Curtis Price. Curtis Price, P.A.-C       Curtis Price, P.A.-C   Curtis Price                [address] [place], [place] [id number] Telephone [phone] Telefax [phone] _________________________________________________________________________     This very nice 70 y.o. MWM presents for 3 month follow up with Hypertension, Hyperlipidemia, Pre-Diabetes screening, Testosterone and Vitamin D Deficiency.      Patient is treated for HTN circa 1994 & BP has been controlled at home. Today's BP: 120/82 mmHg. Patient has had no complaints of any cardiac type chest pain, palpitations, dyspnea/orthopnea/PND, dizziness, claudication, or dependent edema.     Hyperlipidemia is not controlled with diet & meds. Patient denies myalgias or other med SE's. Last Lipids were not at goal with Cholesterol 225; HDL 44; LDL 122; & elevated Triglycerides 294 on 11/30/2015.     Patient has been on Also, the patient is screened expectantly for PreDiabetes  and has had no symptoms of reactive hypoglycemia, diabetic polys, paresthesias or visual blurring.  Last A1c was 5.4% on 11/30/2015.     Patient has been on Testosterone replacement since 2013 and also has hx of Erythrocythemia and has has recommended phlebotomies for same. Further, the patient also has history of Vitamin D Deficiency and supplements vitamin D without any suspected side-effects. Last vitamin D was  38 on 08/31/2015.  Medication Sig  . aspirin81 MG EC Take 81 mg by mouth daily.    Marland Kitchen buPROPion-XL 300 MG TAKE 1 TABLET DAILY FOR MOOD  . cetirizine (ZYRTEC) 10 MG  Take 1 tablet (10 mg total) by mouth at bedtime.  . TOPICORT 0.25 % cream Apply 1 application topically 2 (two) times daily.  . fish oil-omega-31000 MG  Take 1 capsule by  mouth 2 (two) times daily.    . Magnesium 250 MG TABS Take 1 tablet by mouth 2 (two) times daily.    . STRESS B COMPLEX/ZINC Take 1 tablet by mouth daily.    . ranitidine  300 MG  Take 1 tablet 2 x d ay to allow taper off Nexium for heartburn  . ANDROGEL PUMP 1.62% GEL Apply 4 pumps daily  - disp 6 bottles (90 day supply) with refills for 6 months  . traMADol  50 MG  1 tablet 2 x day for pain  . valsartan  320 MG  Take 1 tablet (320 mg total) by mouth daily.   Allergies  Allergen Reactions  . Atenolol   . Atorvastatin   . Darvocet [Propoxyphene N-Acetaminophen]   . Paroxetine   . Pregabalin   . Propoxyphene Hcl    PMHx:   Past Medical History  Diagnosis Date  . BPH (benign prostatic hypertrophy)   . GERD (gastroesophageal reflux disease)   . Fibromyalgia   . Hypogonadism male   . History of epididymitis   . Myofascial pain syndrome   . Nephrolithiasis   . Prostatitis     Recurrent  . Prolapsed internal hemorrhoids   . History of head injury without skull fracture 1980  Concussion  . Tendinitis 2000    Hx of left bicep  . Rheumatic fever     x3 at ages [age] and 29; No significant valvular abnormality on 5/11 echo  . Exertional dyspnea     ETT myoview 5/11: 8 min, no ischemic ECG changes, stopped due to fatigue/ SOB. EF 60%, no ischemia or infarction. Echo 5/11 EF 60%, no significant valvular abnormalities, mild RV dilation and dysfunction, no complete TR doppler jet so PA pressure hard to gauge. 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 R heart cath 6/11: mean RA 10 mmHg, PA 25/18, mean PCWP 13 mmHg, CI 2.1  . Hypertension   . Hyperlipidemia     Myalgias with Lipitor  . Vitamin D deficiency   . 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 Erythrocytosis 04/28/2014  . Hx of adenomatous polyp of colon 06/19/2007   Immunization History  Administered Date(s) Administered  . Influenza, High Dose Seasonal PF 08/13/2014, 08/31/2015  .  Pneumococcal Conjugate-13 08/13/2014  . Pneumococcal Polysaccharide-23 07/12/2012  . Tdap 04/25/2009  . Zoster 07/27/2007   Past Surgical History  Procedure Laterality Date  . Cardiac catheterization  02/2010    Left and right  . Heel spur surgery  1960    Bilateral  . Tonsillectomy  1962  . Finger surgery  2000    Repair of injury to right fifth finger  . Bih repair  08/2005    Incarverated fat by mesh repair   FHx:    Reviewed / unchanged  SHx:    Reviewed / unchanged  Systems Review:  Constitutional: Denies fever, chills, wt changes, headaches, insomnia, fatigue, night sweats, change in appetite. Eyes: Denies redness, blurred vision, diplopia, discharge, itchy, watery eyes.  ENT: Denies discharge, congestion, post nasal drip, epistaxis, sore throat, earache, hearing loss, dental pain, tinnitus, vertigo, sinus pain, snoring.  CV: Denies chest pain, palpitations, irregular heartbeat, syncope, dyspnea, diaphoresis, orthopnea, PND, claudication or edema. Respiratory: denies cough, dyspnea, DOE, pleurisy, hoarseness, laryngitis, wheezing.  Gastrointestinal: Denies dysphagia, odynophagia, heartburn, reflux, water brash, abdominal pain or cramps, nausea, vomiting, bloating, diarrhea, constipation, hematemesis, melena, hematochezia  or hemorrhoids. Genitourinary: Denies dysuria, frequency, urgency, nocturia, hesitancy, discharge, hematuria or flank pain. Musculoskeletal: Denies arthralgias, myalgias, stiffness, jt. swelling, pain, limping or strain/sprain.  Skin: Denies pruritus, rash, hives, warts, acne, eczema or change in skin lesion(s). Neuro: No weakness, tremor, incoordination, spasms, paresthesia or pain. Psychiatric: Denies confusion, memory loss or sensory loss. Endo: Denies change in weight, skin or hair change.  Heme/Lymph: No excessive bleeding, bruising or enlarged lymph nodes.  Physical Exam  BP 120/82 mmHg  Pulse 64  Temp(Src) 97.5 F (36.4 C)  Resp 16  Ht 5\' 10"   (1.778 m)  Wt 201 lb (91.173 kg)  BMI 28.84 kg/m2  Appears well nourished and in no distress. Eyes: PERRLA, EOMs, conjunctiva no swelling or erythema. Sinuses: No frontal/maxillary tenderness ENT/Mouth: EAC's clear, TM's nl w/o erythema, bulging. Nares clear w/o erythema, swelling, exudates. Oropharynx clear without erythema or exudates. Oral hygiene is good. Tongue normal, non obstructing. Hearing intact.  Neck: Supple. Thyroid nl. Car 2+/2+ without bruits, nodes or JVD. Chest: Respirations nl with BS clear & equal w/o rales, rhonchi, wheezing or stridor.  Cor: Heart sounds normal w/ regular rate and rhythm without sig. murmurs, gallops, clicks, or rubs. Peripheral pulses normal and equal  without edema.  Abdomen: Soft & bowel sounds normal. Non-tender w/o guarding, rebound, hernias, masses, or organomegaly.  Lymphatics: Unremarkable.  Musculoskeletal: Full ROM all peripheral extremities, joint stability, 5/5 strength, and normal gait.  Skin: Warm, dry without exposed rashes, lesions or ecchymosis apparent.  Neuro: Cranial nerves intact, reflexes equal bilaterally. Sensory-motor testing grossly intact. Tendon reflexes grossly intact.  Pysch: Alert & oriented x 3.  Insight and judgement nl & appropriate. No ideations.  Assessment and Plan:  1. Essential hypertension  - TSH  2. Hyperlipidemia  - Lipid panel - TSH  3. Other abnormal glucose  - Hemoglobin A1c - Insulin, random  4. Vitamin D deficiency  - VITAMIN D 25 Hydroxy   5. Medication management  - CBC with Differential/Platelet - BASIC METABOLIC PANEL WITH GFR - Hepatic function panel - Magnesium   Recommended regular exercise, BP monitoring, weight control, and discussed med and SE's. Recommended labs to assess and monitor clinical status. Further disposition pending results of labs. Over 30 minutes of exam, counseling, chart review was performed

## 2016-03-10 LAB — LIPID PANEL
Cholesterol: 186 mg/dL (ref 125–200)
HDL: 38 mg/dL — ABNORMAL LOW (ref >=40)
LDL Cholesterol: 109 mg/dL (ref <130)
Total CHOL/HDL Ratio: 4.9 Ratio (ref <=5.0)
Triglycerides: 196 mg/dL — ABNORMAL HIGH (ref <150)
VLDL: 39 mg/dL — ABNORMAL HIGH (ref <30)

## 2016-03-10 LAB — BASIC METABOLIC PANEL WITH GFR
BUN: 17 mg/dL (ref 7–25)
CO2: 25 mmol/L (ref 20–31)
Calcium: 9 mg/dL (ref 8.6–10.3)
Chloride: 105 mmol/L (ref 98–110)
Creat: 1.03 mg/dL (ref 0.70–1.25)
GFR, Est African American: 86 mL/min (ref >=60)
GFR, Est Non African American: 74 mL/min (ref >=60)
Glucose, Bld: 107 mg/dL — ABNORMAL HIGH (ref 65–99)
Potassium: 4.4 mmol/L (ref 3.5–5.3)
Sodium: 140 mmol/L (ref 135–146)

## 2016-03-10 LAB — VITAMIN D 25 HYDROXY (VIT D DEFICIENCY, FRACTURES): Vit D, 25-Hydroxy: 51 ng/mL (ref 30–100)

## 2016-03-10 LAB — HEMOGLOBIN A1C
Hgb A1c MFr Bld: 5.8 % — ABNORMAL HIGH (ref <5.7)
Mean Plasma Glucose: 120 mg/dL

## 2016-03-10 LAB — HEPATIC FUNCTION PANEL
ALT: 29 U/L (ref 9–46)
AST: 23 U/L (ref 10–35)
Albumin: 3.7 g/dL (ref 3.6–5.1)
Alkaline Phosphatase: 60 U/L (ref 40–115)
Bilirubin, Direct: 0.1 mg/dL (ref <=0.2)
Indirect Bilirubin: 0.3 mg/dL (ref 0.2–1.2)
Total Bilirubin: 0.4 mg/dL (ref 0.2–1.2)
Total Protein: 6.1 g/dL (ref 6.1–8.1)

## 2016-03-10 LAB — CBC WITH DIFFERENTIAL/PLATELET
Basophils Absolute: 55 cells/uL (ref 0–200)
Basophils Relative: 1 %
Eosinophils Absolute: 275 cells/uL (ref 15–500)
Eosinophils Relative: 5 %
HCT: 44 % (ref 38.5–50.0)
Hemoglobin: 15.4 g/dL (ref 13.2–17.1)
Lymphocytes Relative: 21 %
Lymphs Abs: 1155 cells/uL (ref 850–3900)
MCH: 31.6 pg (ref 27.0–33.0)
MCHC: 35 g/dL (ref 32.0–36.0)
MCV: 90.2 fL (ref 80.0–100.0)
MPV: 9.6 fL (ref 7.5–12.5)
Monocytes Absolute: 660 cells/uL (ref 200–950)
Monocytes Relative: 12 %
Neutro Abs: 3355 cells/uL (ref 1500–7800)
Neutrophils Relative %: 61 %
Platelets: 288 10*3/uL (ref 140–400)
RBC: 4.88 MIL/uL (ref 4.20–5.80)
RDW: 13.4 % (ref 11.0–15.0)
WBC: 5.5 10*3/uL (ref 3.8–10.8)

## 2016-03-10 LAB — MAGNESIUM: Magnesium: 1.8 mg/dL (ref 1.5–2.5)

## 2016-03-10 LAB — INSULIN, RANDOM: Insulin: 11.6 u[IU]/mL (ref 2.0–19.6)

## 2016-03-29 ENCOUNTER — Other Ambulatory Visit: Payer: Self-pay | Admitting: *Deleted

## 2016-03-29 DIAGNOSIS — J309 Allergic rhinitis, unspecified: Secondary | ICD-10-CM

## 2016-03-29 DIAGNOSIS — M797 Fibromyalgia: Secondary | ICD-10-CM

## 2016-03-29 MED ORDER — CETIRIZINE HCL 10 MG PO TABS: 10.0000 mg | ORAL_TABLET | Freq: Every day | ORAL | Status: DC

## 2016-03-29 MED ORDER — TRAMADOL HCL 50 MG PO TABS: ORAL_TABLET | ORAL | Status: DC

## 2016-04-20 ENCOUNTER — Other Ambulatory Visit: Payer: Self-pay | Admitting: Internal Medicine

## 2016-04-25 ENCOUNTER — Other Ambulatory Visit: Payer: Self-pay | Admitting: Internal Medicine

## 2016-04-25 DIAGNOSIS — K219 Gastro-esophageal reflux disease without esophagitis: Secondary | ICD-10-CM

## 2016-06-11 ENCOUNTER — Other Ambulatory Visit: Payer: Self-pay | Admitting: Internal Medicine

## 2016-06-15 ENCOUNTER — Encounter: Payer: Self-pay | Admitting: Physician Assistant

## 2016-06-15 ENCOUNTER — Ambulatory Visit (INDEPENDENT_AMBULATORY_CARE_PROVIDER_SITE_OTHER): Payer: Medicare Other | Admitting: Physician Assistant

## 2016-06-15 VITALS — BP 126/74 | HR 77 | Temp 97.7°F | Resp 16 | Ht 70.75 in | Wt 213.0 lb

## 2016-06-15 DIAGNOSIS — R7309 Other abnormal glucose: Secondary | ICD-10-CM | POA: Diagnosis not present

## 2016-06-15 DIAGNOSIS — E559 Vitamin D deficiency, unspecified: Secondary | ICD-10-CM | POA: Diagnosis not present

## 2016-06-15 DIAGNOSIS — I10 Essential (primary) hypertension: Secondary | ICD-10-CM

## 2016-06-15 DIAGNOSIS — E785 Hyperlipidemia, unspecified: Secondary | ICD-10-CM | POA: Diagnosis not present

## 2016-06-15 DIAGNOSIS — M1810 Unilateral primary osteoarthritis of first carpometacarpal joint, unspecified hand: Secondary | ICD-10-CM

## 2016-06-15 DIAGNOSIS — Z79899 Other long term (current) drug therapy: Secondary | ICD-10-CM

## 2016-06-15 DIAGNOSIS — M19049 Primary osteoarthritis, unspecified hand: Secondary | ICD-10-CM | POA: Diagnosis not present

## 2016-06-15 LAB — CBC WITH DIFFERENTIAL/PLATELET
Basophils Absolute: 0 cells/uL (ref 0–200)
Basophils Relative: 0 %
Eosinophils Absolute: 348 cells/uL (ref 15–500)
Eosinophils Relative: 6 %
HCT: 46.7 % (ref 38.5–50.0)
Hemoglobin: 16.7 g/dL (ref 13.2–17.1)
Lymphocytes Relative: 22 %
Lymphs Abs: 1276 cells/uL (ref 850–3900)
MCH: 32.9 pg (ref 27.0–33.0)
MCHC: 35.8 g/dL (ref 32.0–36.0)
MCV: 91.9 fL (ref 80.0–100.0)
MPV: 10.5 fL (ref 7.5–12.5)
Monocytes Absolute: 928 cells/uL (ref 200–950)
Monocytes Relative: 16 %
Neutro Abs: 3248 cells/uL (ref 1500–7800)
Neutrophils Relative %: 56 %
Platelets: 147 10*3/uL (ref 140–400)
RBC: 5.08 MIL/uL (ref 4.20–5.80)
RDW: 13.7 % (ref 11.0–15.0)
WBC: 5.8 10*3/uL (ref 3.8–10.8)

## 2016-06-15 LAB — BASIC METABOLIC PANEL WITH GFR
BUN: 16 mg/dL (ref 7–25)
CO2: 23 mmol/L (ref 20–31)
Calcium: 8.7 mg/dL (ref 8.6–10.3)
Chloride: 108 mmol/L (ref 98–110)
Creat: 1.07 mg/dL (ref 0.70–1.25)
GFR, Est African American: 81 mL/min (ref >=60)
GFR, Est Non African American: 70 mL/min (ref >=60)
Glucose, Bld: 95 mg/dL (ref 65–99)
Potassium: 4.5 mmol/L (ref 3.5–5.3)
Sodium: 141 mmol/L (ref 135–146)

## 2016-06-15 LAB — TSH: TSH: 2.66 mIU/L (ref 0.40–4.50)

## 2016-06-15 LAB — LIPID PANEL
Cholesterol: 200 mg/dL (ref 125–200)
HDL: 41 mg/dL (ref >=40)
LDL Cholesterol: 112 mg/dL (ref <130)
Total CHOL/HDL Ratio: 4.9 Ratio (ref <=5.0)
Triglycerides: 236 mg/dL — ABNORMAL HIGH (ref <150)
VLDL: 47 mg/dL — ABNORMAL HIGH (ref <30)

## 2016-06-15 LAB — HEPATIC FUNCTION PANEL
ALT: 33 U/L (ref 9–46)
AST: 24 U/L (ref 10–35)
Albumin: 4 g/dL (ref 3.6–5.1)
Alkaline Phosphatase: 51 U/L (ref 40–115)
Bilirubin, Direct: 0.1 mg/dL (ref <=0.2)
Indirect Bilirubin: 0.5 mg/dL (ref 0.2–1.2)
Total Bilirubin: 0.6 mg/dL (ref 0.2–1.2)
Total Protein: 6 g/dL — ABNORMAL LOW (ref 6.1–8.1)

## 2016-06-15 MED ORDER — DICLOFENAC SODIUM 1 % TD GEL: 2.0000 g | Freq: Four times a day (QID) | TRANSDERMAL | 1 refills | Status: DC

## 2016-06-15 NOTE — Progress Notes (Signed)
Patient ID: Curtis Manifold., male   DOB: 1946/09/04, 70 y.o.   MRN: [id number]  Assessment and Plan:  Hypertension:  -Continue medication,  -monitor blood pressure at home.  -Continue DASH diet.   -Reminder to go to the ER if any CP, SOB, nausea, dizziness, severe HA, changes vision/speech, left arm numbness and tingling, and jaw pain.  Cholesterol: -Continue diet and exercise.  -Check cholesterol.   Pre-diabetes: -Continue diet and exercise.  -Check A1C  Vitamin D Def: -check level -continue medications.   Hypogonadism - continue replacement therapy, check testosterone levels as needed.  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 Further disposition pending results of labs. Future Appointments Date Time Provider [place]  11/16/2016 2:00 PM [name], MD [facility] None    HPI 70 y.o. male  presents for 3 month follow up with hypertension, hyperlipidemia, prediabetes and vitamin D.   His blood pressure has been controlled at home, today their BP is BP: 126/74.   He does workout. He denies chest pain, shortness of breath, dizziness.  He reports that he has been very busy lately.  Has been hammering a lot so has bilateral thumb MCP joints.   He is on cholesterol medication and denies myalgias. His cholesterol is at goal. The cholesterol last visit was:   Lab Results  Component Value Date   CHOL 186 03/09/2016   HDL 38 (L) 03/09/2016   LDLCALC 109 03/09/2016   LDLDIRECT 151.0 02/10/2010   TRIG 196 (H) 03/09/2016   CHOLHDL 4.9 03/09/2016    He has been working on diet and exercise for prediabetes, and denies foot ulcerations, hyperglycemia, hypoglycemia , increased appetite, nausea, paresthesia of the feet, polydipsia, polyuria, visual  disturbances, vomiting and weight loss. Last A1C in the office was:  Lab Results  Component Value Date   HGBA1C 5.8 (H) 03/09/2016  He reports that he has been having some sandwiches and breakfast and has been skipping dinner.    Patient is on Vitamin D supplement.  Lab Results  Component Value Date   VD25OH 51 03/09/2016     BMI is Body mass index is 29.92 kg/m., he is working on diet and exercise. Wt Readings from Last 3 Encounters:  06/15/16 213 lb (96.6 kg)  03/09/16 201 lb (91.2 kg)  02/22/16 203 lb 3.2 oz (92.2 kg)   He has a history of testosterone deficiency and is on testosterone replacement, 2 pumps a day. He states that the testosterone helps with his energy, libido, muscle mass. Lab Results  Component Value Date   TESTOSTERONE 113 (L) 08/31/2015    Current Medications:  Current Outpatient Prescriptions on File Prior to Visit  Medication Sig Dispense Refill  . aspirin (ASPIR-LOW) 81 MG EC tablet Take 81 mg by mouth daily.      Marland Kitchen buPROPion (WELLBUTRIN XL) 300 MG 24 hr tablet TAKE 1 TABLET DAILY FOR MOOD 90 tablet 3  . cetirizine (ZYRTEC) 10 MG tablet Take 1 tablet (10 mg total) by mouth at bedtime. 90 tablet 1  . desoximetasone (TOPICORT) 0.25 % cream Apply 1 application topically 2 (two) times daily. 30 g 0  . fish oil-omega-3 fatty acids 1000 MG capsule Take 1 capsule by mouth 2 (two) times daily.      Marland Kitchen  Magnesium 250 MG TABS Take 1 tablet by mouth 2 (two) times daily.      . Multiple Vitamins-Minerals (STRESS B COMPLEX/ZINC) TABS Take 1 tablet by mouth daily.      . pantoprazole (PROTONIX) 40 MG tablet TAKE 1 TABLET DAILY FOR ACID REFLUX 90 tablet 98  . ranitidine (ZANTAC) 300 MG tablet Take 1 tablet 2 x d ay to allow taper off Nexium for heartburn 180 tablet 1  . traMADol (ULTRAM) 50 MG tablet 1 tablet 2 x day for pain 180 tablet 1  . valsartan (DIOVAN) 320 MG tablet TAKE 1 TABLET DAILY 90 tablet 1  . Testosterone (ANDROGEL PUMP) 20.25 MG/ACT (1.62%) GEL Apply 4  pumps daily  - disp 6 bottles (90 day supply) with refills for 6 months 450 g 1   No current facility-administered medications on file prior to visit.     Medical History:  Past Medical History:  Diagnosis Date  . BPH (benign prostatic hypertrophy)   . 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 Erythrocytosis 04/28/2014  . Exertional dyspnea    ETT myoview 5/11: 8 min, no ischemic ECG changes, stopped due to fatigue/ SOB. EF 60%, no ischemia or infarction. Echo 5/11 EF 60%, no significant valvular abnormalities, mild RV dilation and dysfunction, no complete TR doppler jet so PA pressure hard to gauge. 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 R heart cath 6/11: mean RA 10 mmHg, PA 25/18, mean PCWP 13 mmHg, CI 2.1  . Fibromyalgia   . GERD (gastroesophageal reflux disease)   . History of epididymitis   . History of head injury without skull fracture 1980   Concussion  . Hx of adenomatous polyp of colon 06/19/2007  . Hyperlipidemia    Myalgias with Lipitor  . Hypertension   . Hypogonadism male   . Myofascial pain syndrome   . Nephrolithiasis   . Prolapsed internal hemorrhoids   . Prostatitis    Recurrent  . Rheumatic fever    x3 at ages [age] and 8; No significant valvular abnormality on 5/11 echo  . Tendinitis 2000   Hx of left bicep  . Vitamin D deficiency     Allergies:  Allergies  Allergen Reactions  . Atenolol   . Atorvastatin   . Darvocet [Propoxyphene N-Acetaminophen]   . Paroxetine   . Pregabalin   . Propoxyphene Hcl      Review of Systems:  Review of Systems  Constitutional: Negative for chills, fever and malaise/fatigue.  HENT: Negative for congestion, ear pain and sore throat.   Eyes: Negative for blurred vision and double vision.  Respiratory: Negative for cough, shortness of breath and wheezing.   Cardiovascular: Negative for chest pain, palpitations and leg swelling.  Gastrointestinal: Negative for blood in stool,  constipation, diarrhea, heartburn and melena.  Genitourinary: Negative.   Musculoskeletal: Positive for joint pain.  Skin: Negative.   Neurological: Negative for dizziness, sensory change, loss of consciousness and headaches.  Psychiatric/Behavioral: Negative for depression. The patient is not nervous/anxious and does not have insomnia.     Family history- Review and unchanged  Social history- Review and unchanged  Physical Exam: BP 126/74   Pulse 77   Temp 97.7 F (36.5 C)   Resp 16   Ht 5' 10.75" (1.797 m)   Wt 213 lb (96.6 kg)   SpO2 97%   BMI 29.92 kg/m  Wt Readings from Last 3 Encounters:  06/15/16 213 lb (96.6 kg)  03/09/16 201 lb (  91.2 kg)  02/22/16 203 lb 3.2 oz (92.2 kg)    General Appearance: Well nourished well developed, in no apparent distress. Eyes: PERRLA, EOMs, conjunctiva no swelling or erythema ENT/Mouth: Ear canals normal without obstruction, swelling, erythma, discharge.  TMs normal bilaterally.  Oropharynx moist, clear, without exudate, or postoropharyngeal swelling. Neck: Supple, thyroid normal,no cervical adenopathy  Respiratory: Respiratory effort normal, Breath sounds clear A&P without rhonchi, wheeze, or rale.  No retractions, no accessory usage. Cardio: RRR with no MRGs. Brisk peripheral pulses without edema.  Abdomen: Soft, + BS,  Non tender, no guarding, rebound, hernias, masses. Musculoskeletal: Full ROM, 5/5 strength, Normal gait. + finkelstein left thumb, bilateral thumbs with pain with ROM, no warmth, good distal neurovascular exam Skin: Warm, dry without rashes, lesions, ecchymosis.  Neuro: Awake and oriented X 3, Cranial nerves intact. Normal muscle tone, no cerebellar symptoms. Psych: Normal affect, Insight and Judgment appropriate.    Vicie Mutters, PA-C 9:52 AM [facility]

## 2016-06-15 NOTE — Patient Instructions (Signed)
Get thumb spica splint Alfonse Ras Disease Alfonse Ras disease is inflammation of the tendon on the thumb side of the wrist. Tendons are cords of tissue that connect bones to muscles. The tendons in your hand pass through a tunnel, or sheath. A slippery layer of tissue (synovium) lets the tendons move smoothly in the sheath. With de Quervain disease, the sheath swells or thickens, causing friction and pain. The condition is also called de Quervain tendinosis and de Quervain syndrome. It occurs most often in women who are 76-2 years old. CAUSES  The exact cause of de Quervain disease is not known. It may result from:   Overusing your hands, especially with repetitive motions that involve twisting your hand or using a forceful grip.  Pregnancy.  Rheumatoid disease. RISK FACTORS You may have a greater risk for de Quervain disease if you:  Are a middle-aged woman.  Are pregnant.  Have rheumatoid arthritis.  Have diabetes.  Use your hands far more than normal, especially with a tight grip or excessive twisting. SIGNS AND SYMPTOMS Pain on the thumb side of your wrist is the main symptom of de Quervain disease. Other signs and symptoms include:  Pain that gets worse when you grasp something or turn your wrist.  Pain that extends up the forearm.  Cysts in the area of the pain.  Swelling of your wrist and hand.  A sensation of snapping in the wrist.  Trouble moving the thumb and wrist. DIAGNOSIS  Your health care provider may diagnose de Quervain disease based on your signs and symptoms. A physical exam will also be done. A simple test Wynn Maudlin test) that involves pulling your thumb and wrist to see if this causes pain can help determine whether you have the condition. Sometimes you may need to have an X-ray.  TREATMENT  Avoiding any activity that causes pain and swelling is the best treatment. Other options include:  Wearing a splint.  Taking medicine. Anti-inflammatory  medicines and corticosteroid injections may reduce inflammation and relieve pain.  Having surgery if other treatments do not work. HOME CARE INSTRUCTIONS   Using ice can be helpful after doing activities that involve the sore wrist. To apply ice to the injured area:  Put ice in a plastic bag.  Place a towel between your skin and the bag.  Leave the ice on for 20 minutes, 2-3 times a day.  Take medicines only as directed by your health care provider.  Wear your splint as directed. This will allow your hand to rest and heal. SEEK MEDICAL CARE IF:   Your pain medicine does not help.   Your pain gets worse.  You develop new symptoms. MAKE SURE YOU:   Understand these instructions.  Will watch your condition.  Will get help right away if you are not doing well or get worse.   This information is not intended to replace advice given to you by your health care provider. Make sure you discuss any questions you have with your health care provider.   Document Released: 06/06/2001 Document Revised: 10/02/2014 Document Reviewed: 01/14/2014 Elsevier Interactive Patient Education [employer].

## 2016-06-16 LAB — HEMOGLOBIN A1C
Hgb A1c MFr Bld: 5 % (ref <5.7)
Mean Plasma Glucose: 97 mg/dL

## 2016-06-18 ENCOUNTER — Other Ambulatory Visit: Payer: Self-pay | Admitting: Internal Medicine

## 2016-06-18 DIAGNOSIS — K219 Gastro-esophageal reflux disease without esophagitis: Secondary | ICD-10-CM

## 2016-08-21 ENCOUNTER — Other Ambulatory Visit: Payer: Self-pay | Admitting: Internal Medicine

## 2016-08-21 DIAGNOSIS — M797 Fibromyalgia: Secondary | ICD-10-CM

## 2016-08-21 DIAGNOSIS — E291 Testicular hypofunction: Secondary | ICD-10-CM

## 2016-08-21 MED ORDER — TRAMADOL HCL 50 MG PO TABS: ORAL_TABLET | ORAL | 1 refills | Status: DC

## 2016-08-21 MED ORDER — TESTOSTERONE 20.25 MG/ACT (1.62%) TD GEL: TRANSDERMAL | 1 refills | Status: DC

## 2016-09-07 ENCOUNTER — Encounter: Payer: Self-pay | Admitting: Internal Medicine

## 2016-11-03 ENCOUNTER — Other Ambulatory Visit: Payer: Self-pay | Admitting: Internal Medicine

## 2016-11-16 ENCOUNTER — Ambulatory Visit (INDEPENDENT_AMBULATORY_CARE_PROVIDER_SITE_OTHER): Payer: Medicare Other | Admitting: Internal Medicine

## 2016-11-16 ENCOUNTER — Other Ambulatory Visit: Payer: Self-pay | Admitting: *Deleted

## 2016-11-16 ENCOUNTER — Encounter: Payer: Self-pay | Admitting: Internal Medicine

## 2016-11-16 VITALS — BP 128/72 | HR 80 | Temp 97.3°F | Resp 16 | Ht 70.0 in | Wt 210.6 lb

## 2016-11-16 DIAGNOSIS — I10 Essential (primary) hypertension: Secondary | ICD-10-CM

## 2016-11-16 DIAGNOSIS — Z0000 Encounter for general adult medical examination without abnormal findings: Secondary | ICD-10-CM | POA: Diagnosis not present

## 2016-11-16 DIAGNOSIS — R7303 Prediabetes: Secondary | ICD-10-CM

## 2016-11-16 DIAGNOSIS — Z125 Encounter for screening for malignant neoplasm of prostate: Secondary | ICD-10-CM

## 2016-11-16 DIAGNOSIS — Z136 Encounter for screening for cardiovascular disorders: Secondary | ICD-10-CM

## 2016-11-16 DIAGNOSIS — E559 Vitamin D deficiency, unspecified: Secondary | ICD-10-CM

## 2016-11-16 DIAGNOSIS — Z79899 Other long term (current) drug therapy: Secondary | ICD-10-CM

## 2016-11-16 DIAGNOSIS — E782 Mixed hyperlipidemia: Secondary | ICD-10-CM

## 2016-11-16 DIAGNOSIS — Z1212 Encounter for screening for malignant neoplasm of rectum: Secondary | ICD-10-CM

## 2016-11-16 DIAGNOSIS — Z0001 Encounter for general adult medical examination with abnormal findings: Secondary | ICD-10-CM

## 2016-11-16 DIAGNOSIS — K219 Gastro-esophageal reflux disease without esophagitis: Secondary | ICD-10-CM

## 2016-11-16 DIAGNOSIS — E291 Testicular hypofunction: Secondary | ICD-10-CM

## 2016-11-16 DIAGNOSIS — M797 Fibromyalgia: Secondary | ICD-10-CM

## 2016-11-16 LAB — CBC WITH DIFFERENTIAL/PLATELET
Basophils Absolute: 0 cells/uL (ref 0–200)
Basophils Relative: 0 %
Eosinophils Absolute: 280 cells/uL (ref 15–500)
Eosinophils Relative: 4 %
HCT: 49.3 % (ref 38.5–50.0)
Hemoglobin: 17.6 g/dL — ABNORMAL HIGH (ref 13.2–17.1)
Lymphocytes Relative: 14 %
Lymphs Abs: 980 cells/uL (ref 850–3900)
MCH: 32.7 pg (ref 27.0–33.0)
MCHC: 35.7 g/dL (ref 32.0–36.0)
MCV: 91.6 fL (ref 80.0–100.0)
MPV: 10.5 fL (ref 7.5–12.5)
Monocytes Absolute: 770 cells/uL (ref 200–950)
Monocytes Relative: 11 %
Neutro Abs: 4970 cells/uL (ref 1500–7800)
Neutrophils Relative %: 71 %
Platelets: 173 10*3/uL (ref 140–400)
RBC: 5.38 MIL/uL (ref 4.20–5.80)
RDW: 13.3 % (ref 11.0–15.0)
WBC: 7 10*3/uL (ref 3.8–10.8)

## 2016-11-16 LAB — BASIC METABOLIC PANEL WITH GFR
BUN: 18 mg/dL (ref 7–25)
CO2: 22 mmol/L (ref 20–31)
Calcium: 9 mg/dL (ref 8.6–10.3)
Chloride: 105 mmol/L (ref 98–110)
Creat: 1.14 mg/dL (ref 0.70–1.25)
GFR, Est African American: 75 mL/min (ref >=60)
GFR, Est Non African American: 65 mL/min (ref >=60)
Glucose, Bld: 127 mg/dL — ABNORMAL HIGH (ref 65–99)
Potassium: 4.2 mmol/L (ref 3.5–5.3)
Sodium: 139 mmol/L (ref 135–146)

## 2016-11-16 LAB — HEPATIC FUNCTION PANEL
ALT: 32 U/L (ref 9–46)
AST: 20 U/L (ref 10–35)
Albumin: 3.9 g/dL (ref 3.6–5.1)
Alkaline Phosphatase: 49 U/L (ref 40–115)
Bilirubin, Direct: 0.1 mg/dL (ref <=0.2)
Indirect Bilirubin: 0.5 mg/dL (ref 0.2–1.2)
Total Bilirubin: 0.6 mg/dL (ref 0.2–1.2)
Total Protein: 6.2 g/dL (ref 6.1–8.1)

## 2016-11-16 LAB — LIPID PANEL
Cholesterol: 187 mg/dL (ref <200)
HDL: 45 mg/dL (ref >40)
LDL Cholesterol: 100 mg/dL — ABNORMAL HIGH (ref <100)
Total CHOL/HDL Ratio: 4.2 Ratio (ref <5.0)
Triglycerides: 210 mg/dL — ABNORMAL HIGH (ref <150)
VLDL: 42 mg/dL — ABNORMAL HIGH (ref <30)

## 2016-11-16 LAB — PSA: PSA: 1.5 ng/mL (ref <=4.0)

## 2016-11-16 LAB — TSH: TSH: 2.59 mIU/L (ref 0.40–4.50)

## 2016-11-16 MED ORDER — VALSARTAN 320 MG PO TABS: 320.0000 mg | ORAL_TABLET | Freq: Every day | ORAL | 1 refills | Status: DC

## 2016-11-16 MED ORDER — CETIRIZINE HCL 10 MG PO TABS: 10.0000 mg | ORAL_TABLET | Freq: Every day | ORAL | 1 refills | Status: DC

## 2016-11-16 NOTE — Progress Notes (Signed)
[facility]   [name], M.D.    Uvaldo Bristle. Silverio Lay, P.A.-C      Starlyn Skeans, P.A.-C  [facility]                [address] [place] [id number] Telephone [phone] Telefax [phone] Annual  Screening/Preventative Visit  & Comprehensive Evaluation & Examination     This very nice 70 y.o. MWM presents for a Screening/Preventative Visit & comprehensive evaluation and management of multiple medical co-morbidities.  Patient has been followed for HTN, Prediabetes, Hyperlipidemia,  Low Testosterone  and Vitamin D Deficiency. Patient also has hx/o GERD with occasional break thru of sx's.      HTN predates since 61. Patient's BP has been controlled at home.  Today's BP is at goal - 128/72. Heart cath in 2011 was negative for significant CAD. Patient denies any cardiac symptoms as chest pain, palpitations, shortness of breath, dizziness or ankle swelling.     Patient's hyperlipidemia is controlled with diet and medications. Patient denies myalgias or other medication SE's. Last lipids were not at goal with elevated LDL & Trig's: Lab Results  Component Value Date   CHOL 200 06/15/2016   HDL 41 06/15/2016   LDLCALC 112 06/15/2016   TRIG 236 (H) 06/15/2016   CHOLHDL 4.9 06/15/2016      Patient is proactively screened for prediabetes and patient denies reactive hypoglycemic symptoms, visual blurring, diabetic polys or paresthesias. Last A1c was at goal:  Lab Results  Component Value Date   HGBA1C 5.0 06/15/2016       Parient has Low Testosterone and has been on replacement since 2013 with improved sense of well-being, mood stamina but does recount difficulty with ED.  Finally, patient has history of Vitamin D Deficiency ("56" in Dec 2016) and last vitamin D was still relatively low (goal 70-100): Lab Results  Component Value Date   VD25OH 51 03/09/2016   Current Outpatient Prescriptions  on File Prior to Visit  Medication Sig  . aspirin  81 MG  Take 81 mg by mouth daily.    Marland Kitchen buPROPion-XL 300 MG  TAKE 1 TABLET DAILY FOR MOOD  . cetirizine  10 MG tablet Take 1 tablet (10 mg total) by mouth at bedtime.  Marland Kitchen desoximetasone 0.25 % crm Apply 1 application topically 2 (two) times daily.  . diclofenac  1 % GEL Apply 2 g topically 4 (four) times daily.  . fish oil-omega-3  1000 MG  Take 1 capsule by mouth 2 (two) times daily.    . Magnesium 250 MG TABS Take 1 tablet by mouth 2 (two) times daily.    . STRESS B COMPLEX/ZINC Take 1 tablet by mouth daily.    . pantoprazole 40 MG tablet TAKE 1 TABLET DAILY FOR ACID REFLUX  . ranitidine  300 MG tablet TAKE 1 TABLET TWICE A DAY   . ANDROGEL PMP (1.62%) GEL Apply 4 pumps daily   . traMADol  50 MG tablet 1 tablet 2 x day for pain  . valsartan  320 MG tablet TAKE 1 TABLET DAILY   Allergies  Allergen Reactions  . Atenolol   . Atorvastatin   . Darvocet [Propoxyphene N-Acetaminophen]   . Paroxetine   . Pregabalin   . Propoxyphene Hcl    Past Medical History:  Diagnosis Date  . BPH (benign prostatic hypertrophy)   . 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 Erythrocytosis 04/28/2014  . Exertional dyspnea    ETT myoview 5/11: 8 min, no ischemic ECG changes, stopped due to fatigue/ SOB. EF 60%, no ischemia or infarction. Echo 5/11 EF 60%, no significant valvular abnormalities, mild RV dilation and dysfunction, no complete TR doppler jet so PA pressure hard to gauge. 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 R heart cath 6/11: mean RA 10 mmHg, PA 25/18, mean PCWP 13 mmHg, CI 2.1  . Fibromyalgia   . GERD (gastroesophageal reflux disease)   . History of epididymitis   . History of head injury without skull fracture 1980   Concussion  . Hx of adenomatous polyp of colon 06/19/2007  . Hyperlipidemia    Myalgias with Lipitor  . Hypertension   . Hypogonadism male   . Myofascial pain syndrome   .  Nephrolithiasis   . Prolapsed internal hemorrhoids   . Prostatitis    Recurrent  . Rheumatic fever    x3 at ages [age] and 38; No significant valvular abnormality on 5/11 echo  . Tendinitis 2000   Hx of left bicep  . Vitamin D deficiency    Health Maintenance  Topic Date Due  . Hepatitis C Screening  07-15-1946  . INFLUENZA VACCINE  04/25/2016  . TETANUS/TDAP  04/26/2019  . COLONOSCOPY  06/08/2020  . PNA vac Low Risk Adult  Completed   Immunization History  Administered Date(s) Administered  . Influenza, High Dose Seasonal PF 08/13/2014, 08/31/2015  . Pneumococcal Conjugate-13 08/13/2014  . Pneumococcal Polysaccharide-23 07/12/2012  . Tdap 04/25/2009  . Zoster 07/27/2007   Past Surgical History:  Procedure Laterality Date  . BIH REPAIR  08/2005   Incarverated fat by mesh repair  . CARDIAC CATHETERIZATION  02/2010   Left and right  . FINGER SURGERY  2000   Repair of injury to right fifth finger  . HEEL SPUR SURGERY  1960   Bilateral  . TONSILLECTOMY  1962   Family History  Problem Relation Age of Onset  . Hypertension Mother   . Breast cancer Mother   . Aortic stenosis Father   . Stroke Neg Hx   . Colon cancer Neg Hx   . Colon polyps Neg Hx   . Hypertension Other   . Aortic stenosis Other   . Breast cancer Other   . Diabetes Other   . Other Other     ASHD and epilepsy   Social History   Social History  . Marital status: Married    Spouse name: N/A  . Number of children: N/A  . Years of education: N/A   Occupational History  . 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 Smoking status: Never Smoker  . Smokeless tobacco: Never Used  . Alcohol use 7.2 oz/week    12 Cans of beer per week     Comment: beer and liquoer  . Drug use: No  . Sexual activity: Not on file   Social History Narrative   Married, lives in [place]    ROS Constitutional: Denies fever, chills, weight  loss/gain, headaches, insomnia,  night sweats or change in appetite. Does c/o fatigue. Eyes: Denies redness, blurred vision, diplopia, discharge, itchy or watery eyes.  ENT: Denies discharge, congestion, post nasal drip, epistaxis, sore throat, earache, hearing loss, dental pain, Tinnitus, Vertigo, Sinus pain or snoring.  Cardio:  Denies chest pain, palpitations, irregular heartbeat, syncope, dyspnea, diaphoresis, orthopnea, PND, claudication or edema Respiratory: denies cough, dyspnea, DOE, pleurisy, hoarseness, laryngitis or wheezing.  Gastrointestinal: Denies dysphagia, heartburn, reflux, water brash, pain, cramps, nausea, vomiting, bloating, diarrhea, constipation, hematemesis, melena, hematochezia, jaundice or hemorrhoids Genitourinary: Denies dysuria, frequency, urgency, nocturia, hesitancy, discharge, hematuria or flank pain Musculoskeletal: Denies arthralgia, myalgia, stiffness, Jt. Swelling, pain, limp or strain/sprain. Denies Falls. Skin: Denies puritis, rash, hives, warts, acne, eczema or change in skin lesion Neuro: No weakness, tremor, incoordination, spasms, paresthesia or pain Psychiatric: Denies confusion, memory loss or sensory loss. Denies Depression. Endocrine: Denies change in weight, skin, hair change, nocturia, and paresthesia, diabetic polys, visual blurring or hyper / hypo glycemic episodes.  Heme/Lymph: No excessive bleeding, bruising or enlarged lymph nodes.  Physical Exam  BP 128/72   Pulse 80   Temp 97.3 F (36.3 C)   Resp 16   Ht 5\' 10"  (1.778 m)   Wt 210 lb 9.6 oz (95.5 kg)   BMI 30.22 kg/m   General Appearance: Well nourished, in no apparent distress.  Eyes: PERRLA, EOMs, conjunctiva no swelling or erythema, normal fundi and vessels. Sinuses: No frontal/maxillary tenderness ENT/Mouth: EACs patent / TMs  nl. Nares clear without erythema, swelling, mucoid exudates. Oral hygiene is good. No erythema, swelling, or exudate. Tongue normal, non-obstructing.  Tonsils not swollen or erythematous. Hearing normal.  Neck: Supple, thyroid normal. No bruits, nodes or JVD. Respiratory: Respiratory effort normal.  BS equal and clear bilateral without rales, rhonci, wheezing or stridor. Cardio: Heart sounds are normal with regular rate and rhythm and no murmurs, rubs or gallops. Peripheral pulses are normal and equal bilaterally without edema. No aortic or femoral bruits. Chest: symmetric with normal excursions and percussion.  Abdomen: Soft, with Nl bowel sounds. Nontender, no guarding, rebound, hernias, masses, or organomegaly.  Lymphatics: Non tender without lymphadenopathy.  Genitourinary: No hernias.Testes nl. DRE - prostate nl for age - smooth & firm w/o nodules. Musculoskeletal: Full ROM all peripheral extremities, joint stability, 5/5 strength, and normal gait. Skin: Warm and dry without rashes, lesions, cyanosis, clubbing or  ecchymosis.  Neuro: Cranial nerves intact, reflexes equal bilaterally. Normal muscle tone, no cerebellar symptoms. Sensation intact.  Pysch: Alert and oriented X 3 with normal affect, insight and judgment appropriate.   Assessment and Plan  1. Annual Preventative/Screening Exam   2. Essential hypertension  - Microalbumin / creatinine urine ratio - EKG 12-Lead - [place], RETROPERITNL ABD,  LTD - Urinalysis, Routine w reflex microscopic - CBC with Differential/Platelet - BASIC METABOLIC PANEL WITH GFR - TSH  3. Mixed hyperlipidemia  - EKG 12-Lead - [place], RETROPERITNL ABD,  LTD - Hepatic function panel - Lipid panel - TSH  4. Prediabetes  - EKG 12-Lead - [place], RETROPERITNL ABD,  LTD - Hemoglobin A1c - Insulin, random  5. Vitamin D deficiency  - VITAMIN D 25 Hydroxy   6. Testosterone Deficiency  - Testosterone  7. Gastroesophageal reflux disease   8. Fibromyalgia   9. Screening for rectal cancer  - POC Hemoccult Bld/Stl   10. Prostate cancer screening  - PSA  11. Screening for ischemic heart  disease  - EKG 12-Lead  12. Screening for AAA (aortic abdominal aneurysm)  - [place], RETROPERITNL ABD,  LTD  13. Medication management  - Urinalysis, Routine w reflex microscopic - Testosterone - CBC with Differential/Platelet - BASIC METABOLIC PANEL WITH GFR - Hepatic function panel - Magnesium - Lipid panel - TSH - Hemoglobin A1c - Insulin, random -  VITAMIN D 25 Hydroxy       Continue prudent diet as discussed, weight control, BP monitoring, regular exercise, and medications as discussed.  Discussed med effects and SE's. Routine screening labs and tests as requested with regular follow-up as recommended. Over 40 minutes of exam, counseling, chart review and high complex critical decision making was performed

## 2016-11-16 NOTE — Patient Instructions (Signed)

## 2016-11-17 LAB — URINALYSIS, ROUTINE W REFLEX MICROSCOPIC
Bilirubin Urine: NEGATIVE
Glucose, UA: NEGATIVE
Hgb urine dipstick: NEGATIVE
Ketones, ur: NEGATIVE
Nitrite: NEGATIVE
Protein, ur: NEGATIVE
Specific Gravity, Urine: 1.014 (ref 1.001–1.035)
pH: 6.5 (ref 5.0–8.0)

## 2016-11-17 LAB — URINALYSIS, MICROSCOPIC ONLY
Bacteria, UA: NONE SEEN [HPF]
Casts: NONE SEEN [LPF]
Crystals: NONE SEEN [HPF]
RBC / HPF: NONE SEEN RBC/HPF (ref <=2)
Squamous Epithelial / LPF: NONE SEEN [HPF] (ref <=5)
Yeast: NONE SEEN [HPF]

## 2016-11-17 LAB — MICROALBUMIN / CREATININE URINE RATIO
Creatinine, Urine: 102 mg/dL (ref 20–370)
Microalb Creat Ratio: 4 mcg/mg creat (ref <30)
Microalb, Ur: 0.4 mg/dL

## 2016-11-17 LAB — TESTOSTERONE: Testosterone: 460 ng/dL (ref 250–827)

## 2016-11-17 LAB — MAGNESIUM: Magnesium: 1.7 mg/dL (ref 1.5–2.5)

## 2016-11-17 LAB — HEMOGLOBIN A1C
Hgb A1c MFr Bld: 5.1 % (ref <5.7)
Mean Plasma Glucose: 100 mg/dL

## 2016-11-17 LAB — INSULIN, RANDOM: Insulin: 74.5 u[IU]/mL — ABNORMAL HIGH (ref 2.0–19.6)

## 2016-11-17 LAB — VITAMIN D 25 HYDROXY (VIT D DEFICIENCY, FRACTURES): Vit D, 25-Hydroxy: 89 ng/mL (ref 30–100)

## 2016-12-30 ENCOUNTER — Other Ambulatory Visit: Payer: Self-pay | Admitting: Internal Medicine

## 2016-12-30 DIAGNOSIS — K219 Gastro-esophageal reflux disease without esophagitis: Secondary | ICD-10-CM

## 2017-02-25 NOTE — Progress Notes (Deleted)
Patient ID: Curtis Price., male   DOB: Feb 18, 1946, 71 y.o.   MRN: [id number]  MEDICARE ANNUAL WELLNESS VISIT AND OV  Assessment:   Essential hypertension Hypothyroidism-check TSH level, continue medications the same, reminded to take on an empty stomach 30-83mins before food.  - TSH  Prediabetes Discussed general issues about diabetes pathophysiology and management., Educational material distributed., Suggested low cholesterol diet., Encouraged aerobic exercise., Discussed foot care., Reminded to get yearly retinal exam. - Hemoglobin A1c - Insulin, random  Hyperlipidemia -continue medications, check lipids, decrease fatty foods, increase activity.  - Lipid panel   Gastroesophageal reflux disease, esophagitis presence not specified -Continue PPI/H2 blocker, diet discussed  Testosterone Deficiency Hypogonadism- continue replacement therapy, check testosterone levels as needed.   Medication management - CBC with Differential/Platelet - BASIC METABOLIC PANEL WITH GFR - Hepatic function panel - Magnesium   Vitamin D deficiency  Elevated serum immunoglobulin free light chains - CBC with Differential/Platelet   BPH (benign prostatic hypertrophy) - Urinalysis, Routine w reflex microscopic (not at [facility]) - Urine culture  Erectile dysfunction, unspecified erectile dysfunction type  Fibromyalgia ? Causing fatigue, check labs  Hx of adenomatous polyp of colon Up to date  Encounter for Medicare annual wellness exam   Plan:   During the course of the visit the patient was educated and counseled about appropriate screening and preventive services including:    Pneumococcal vaccine   Influenza vaccine  Td vaccine  Screening electrocardiogram  Bone densitometry screening  Colorectal cancer screening  Diabetes screening  Glaucoma screening  Nutrition counseling   Advanced directives: requested   Subjective:  Curtis Price.  presents for Medicare  Annual Wellness Visit and OV.    He states that he has severe fatigue, will get up at 7 and want to go back to bed at 9:30AM. No CP, SOB, abnormal palptiations, denies snoring/sleep apnea.   Patient is treated for HTN since 1994 & BP has been controlled at home.   . Patient had a negative Myoview & Normal Heart Cath in 2011. Patient has had no complaints of any cardiac type chest pain, palpitations, dyspnea/orthopnea/PND, dizziness, claudication, or dependent edema.  Hyperlipidemia is controlled with diet & meds. Patient denies myalgias or other med SE's. Last Lipids were at goal - Lab Results  Component Value Date   CHOL 187 11/16/2016   HDL 45 11/16/2016   LDLCALC 100 (H) 11/16/2016   LDLDIRECT 151.0 02/10/2010   TRIG 210 (H) 11/16/2016   CHOLHDL 4.2 11/16/2016   Also, the patient is screened for PreDiabetes and has had no symptoms of reactive hypoglycemia, diabetic polys, paresthesias or visual blurring.  Lab Results  Component Value Date   HGBA1C 5.1 11/16/2016    Further, the patient also has history of Vitamin D Deficiency and supplements vitamin D without any suspected side-effects.  Lab Results  Component Value Date   VD25OH 89 11/16/2016   BMI is There is no height or weight on file to calculate BMI., he is working on diet and exercise. Wt Readings from Last 3 Encounters:  11/16/16 210 lb 9.6 oz (95.5 kg)  06/15/16 213 lb (96.6 kg)  03/09/16 201 lb (91.2 kg)   Names of Other Physician/Practitioners you currently use: 1. [facility] here for primary care 2. [employer], glasses, eye doctor, last visit 09/2015 3. Dr Shanon Brow Spong,DDS, dentist, last visit 2017/every 6 months  Patient Care Team: [name], MD as PCP - General (Internal Medicine) Loralie Champagne  Chauncey Cruel, MD as Consulting Physician (Cardiology) Sable Feil, MD as Consulting Physician (Gastroenterology) Heath Lark, MD as Consulting Physician  (Hematology and Oncology) Bensimhon, Shaune Pascal, MD as Consulting Physician (Cardiology)  Medication Review: Current Outpatient Prescriptions on File Prior to Visit  Medication Sig  . aspirin (ASPIR-LOW) 81 MG EC tablet Take 81 mg by mouth daily.    Marland Kitchen buPROPion (WELLBUTRIN XL) 300 MG 24 hr tablet TAKE 1 TABLET DAILY FOR MOOD  . cetirizine (ZYRTEC) 10 MG tablet Take 1 tablet (10 mg total) by mouth at bedtime.  Marland Kitchen desoximetasone (TOPICORT) 0.25 % cream Apply 1 application topically 2 (two) times daily.  . diclofenac sodium (VOLTAREN) 1 % GEL Apply 2 g topically 4 (four) times daily.  . fish oil-omega-3 fatty acids 1000 MG capsule Take 1 capsule by mouth 2 (two) times daily.    . Magnesium 250 MG TABS Take 1 tablet by mouth 2 (two) times daily.    . Multiple Vitamins-Minerals (STRESS B COMPLEX/ZINC) TABS Take 1 tablet by mouth daily.    . pantoprazole (PROTONIX) 40 MG tablet TAKE 1 TABLET DAILY FOR ACID REFLUX  . ranitidine (ZANTAC) 300 MG tablet TAKE 1 TABLET TWICE A DAY TO ALLOW TAPER OFF NEXIUM FOR HEARTBURN  . Testosterone (ANDROGEL PUMP) 20.25 MG/ACT (1.62%) GEL Apply 4 pumps daily  - disp 6 bottles (90 day supply) with refills for 6 months  . traMADol (ULTRAM) 50 MG tablet 1 tablet 2 x day for pain  . valsartan (DIOVAN) 320 MG tablet Take 1 tablet (320 mg total) by mouth daily.   No current facility-administered medications on file prior to visit.     Current Problems (verified) Patient Active Problem List   Diagnosis Date Noted  . Obesity (BMI 30-39.9) 09/05/2015  . Other abnormal glucose 08/31/2015  . Erectile dysfunction 08/31/2015  . Encounter for Medicare annual wellness exam 05/28/2015  . 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 Prediabetes 01/26/2014  . Medication management 11/19/2013  . BPH (benign prostatic hypertrophy)   . GERD   . Fibromyalgia   . Testosterone Deficiency   . Hyperlipidemia   . Vitamin D deficiency   . Essential hypertension  02/15/2010  . Hx of adenomatous polyp of colon 06/19/2007   Screening Tests Immunization History  Administered Date(s) Administered  . Influenza, High Dose Seasonal PF 08/13/2014, 08/31/2015  . Pneumococcal Conjugate-13 08/13/2014  . Pneumococcal Polysaccharide-23 07/12/2012  . Tdap 04/25/2009  . Zoster 07/27/2007   Preventative care: Last colonoscopy: 2016 Dr. Carlean Purl  History reviewed: allergies, current medications, past family history, past medical history, past social history, past surgical history and problem list  Allergies Allergies  Allergen Reactions  . Atenolol   . Atorvastatin   . Darvocet [Propoxyphene N-Acetaminophen]   . Paroxetine   . Pregabalin   . Propoxyphene Hcl     SURGICAL HISTORY He  has a past surgical history that includes Cardiac catheterization (02/2010); Heel spur surgery (1960); Tonsillectomy (1962); Finger surgery (2000); and BIH REPAIR (08/2005). FAMILY HISTORY His family history includes Aortic stenosis in his father and other; Breast cancer in his mother and other; Diabetes in his other; Hypertension in his mother and other; Other in his other. SOCIAL HISTORY He  reports that he has never smoked. He has never used smokeless tobacco. He reports that he drinks about 7.2 oz of alcohol per week . He reports that he does not use drugs.  MEDICARE WELLNESS OBJECTIVES: Physical activity:   Cardiac risk factors:   Depression/mood  screen:   Depression screen [facility] 2/9 11/16/2016  Decreased Interest 0  Down, Depressed, Hopeless 0  PHQ - 2 Score 0    ADLs:  In your present state of health, do you have any difficulty performing the following activities: 11/16/2016  Hearing? N  Vision? N  Difficulty concentrating or making decisions? N  Walking or climbing stairs? N  Dressing or bathing? N  Doing errands, shopping? N  Some recent data might be hidden     Cognitive Testing  Alert? Yes  Normal Appearance?Yes  Oriented to person? Yes  Place? Yes    Time? Yes  Recall of three objects?  Yes  Can perform simple calculations? Yes  Displays appropriate judgment?Yes  Can read the correct time from a watch face?Yes  EOL planning:     Objective:     There were no vitals taken for this visit.   General Appearance:  Alert  WD/WN, male  in no apparent distress. Eyes: PERRLA, EOMs nl, conjunctiva normal, normal fundi and vessels. Sinuses: Sl bilat.  frontal/maxillary tenderness ENT/Mouth: EACs patent / Lt. TM  Nl. & Rt TM dull pink w/splayed reflex.  Nares clear without erythema, swelling, mucoid exudates. Oral hygiene is good. 2+ erythema, w/o exudate. Tongue normal, non-obstructing. Tonsils not swollen or erythematous. Hearing normal.  Neck: Supple, thyroid normal. No bruits, nodes or JVD. Respiratory: Respiratory effort normal.  BS equal and clear bilateral without rales, rhonci, wheezing or stridor. Cardio: Heart sounds are normal with regular rate and rhythm and no murmurs, rubs or gallops. Peripheral pulses are normal and equal bilaterally without edema. No aortic or femoral bruits. Chest: symmetric with normal excursions and percussion.  Abdomen: Distended, firm, soft, with nl bowel sounds, + bulging retractable hernia just superior to well healed surgical scar from previous hernia. Nontender, no guarding, rebound, hernias, masses, or organomegaly.  Lymphatics: Non tender without lymphadenopathy.  Musculoskeletal: Full ROM all peripheral extremities, joint stability, 5/5 strength, and normal gait. Skin: Warm and dry without rashes, lesions, cyanosis, clubbing or  ecchymosis.  Neuro: Cranial nerves intact, reflexes equal bilaterally. Normal muscle tone, no cerebellar symptoms. Sensation intact.  Pysch: Alert and oriented X 3 with normal affect, insight and judgment appropriate.   Medicare Attestation I have personally reviewed: The patient's medical and social history Their use of alcohol, tobacco or illicit drugs Their current  medications and supplements The patient's functional ability including ADLs,fall risks, home safety risks, cognitive, and hearing and visual impairment Diet and physical activities Evidence for depression or mood disorders  The patient's weight, height, BMI, and visual acuity have been recorded in the chart.  I have made referrals, counseling, and provided education to the patient based on review of the above and I have provided the patient with a written personalized care plan for preventive services.  Over 40 minutes of exam, counseling, chart review was performed.  Vicie Mutters, PA-C   02/25/2017

## 2017-02-27 ENCOUNTER — Ambulatory Visit: Payer: Self-pay | Admitting: Physician Assistant

## 2017-03-21 NOTE — Progress Notes (Signed)
Patient ID: Curtis Price., male   DOB: [birth year], 71 y.o.   MRN: [id number]  MEDICARE ANNUAL WELLNESS VISIT AND OV  Assessment:   Essential hypertension - continue medications, DASH diet, exercise and monitor at home. Call if greater than 130/80.  -     CBC with Differential/Platelet -     BASIC METABOLIC PANEL WITH GFR -     Hepatic function panel -     TSH  Gastroesophageal reflux disease, esophagitis presence not specified Continue PPI/H2 blocker, diet discussed  Testosterone Deficiency Hypogonadism- continue replacement therapy, check testosterone levels as needed.   Benign prostatic hyperplasia with urinary frequency Continue meds, monitor  Erectile dysfunction, unspecified erectile dysfunction type Weight loss, continue meds PRN  Fibromyalgia Continue medications  Mixed hyperlipidemia -continue medications, check lipids, decrease fatty foods, increase activity.  -     Lipid panel  Vitamin D deficiency  Medication management -     Magnesium  Elevated serum immunoglobulin free light chains -     CBC with Differential/Platelet  Encounter for Medicare annual wellness exam  Hx of adenomatous polyp of colon  Other abnormal glucose Discussed general issues about diabetes pathophysiology and management., Educational material distributed., Suggested low cholesterol diet., Encouraged aerobic exercise., Discussed foot care., Reminded to get yearly retinal exam. -     Hemoglobin A1c  Obesity (BMI 30-39.9) - long discussion about weight loss, diet, and exercise  Sore throat -     amoxicillin-clavulanate (AUGMENTIN) 875-125 MG tablet; Take 1 tablet by mouth 2 (two) times daily. -     predniSONE (DELTASONE) 20 MG tablet; 2 tablets daily for 3 days, 1 tablet daily for 4 days.    Future Appointments Date Time Provider [place]  06/04/2017 9:30 AM [name], MD [facility] None  12/19/2017 2:00 PM [name], MD [facility] None     Plan:    During the course of the visit the patient was educated and counseled about appropriate screening and preventive services including:    Pneumococcal vaccine   Influenza vaccine  Td vaccine  Screening electrocardiogram  Bone densitometry screening  Colorectal cancer screening  Diabetes screening  Glaucoma screening  Nutrition counseling   Advanced directives: requested  Subjective:  Curtis Price.  presents for Medicare Annual Wellness Visit and OV.   Patient is treated for HTN since 1994 & BP has been controlled at home. BP: 120/80 . Patient had a negative Myoview & Normal Heart Cath in 2011. Patient has had no complaints of any cardiac type chest pain, palpitations, dyspnea/orthopnea/PND, dizziness, claudication, or dependent edema.  He has had sore throat, neck lymph pain x 3 weeks, sore roof of his mouth. Has had headaches across his sinuses x 3 weeks. No fever, chills, no one sick around him, not taking anything for it.   Hyperlipidemia is controlled with diet & meds. Patient denies myalgias or other med SE's. Last Lipids were at goal - Lab Results  Component Value Date   CHOL 187 11/16/2016   HDL 45 11/16/2016   LDLCALC 100 (H) 11/16/2016   LDLDIRECT 151.0 02/10/2010   TRIG 210 (H) 11/16/2016   CHOLHDL 4.2 11/16/2016   Also, the patient is screened for PreDiabetes and has had no symptoms of reactive hypoglycemia, diabetic polys, paresthesias or visual blurring.  Lab Results  Component Value Date   HGBA1C 5.1 11/16/2016    Further, the patient also has history of Vitamin D Deficiency Lab Results  Component Value Date   VD25OH 61  11/16/2016   He has a history of testosterone deficiency and is on testosterone replacement, the gel. He states that the testosterone helps with his energy, libido, muscle mass. Lab Results  Component Value Date   TESTOSTERONE 460 11/16/2016   BMI is Body mass index is 30.28 kg/m., he is working on diet and exercise. Wt  Readings from Last 3 Encounters:  03/23/17 211 lb (95.7 kg)  11/16/16 210 lb 9.6 oz (95.5 kg)  06/15/16 213 lb (96.6 kg)   Names of Other Physician/Practitioners you currently use: 1. [facility] here for primary care 2. [employer], glasses, eye doctor, last visit 09/2015 3. Dr Shanon Brow Spong,DDS, dentist, last visit 2018/every 6 months  Patient Care Team: [name], MD as PCP - General (Internal Medicine) Larey Dresser, MD as Consulting Physician (Cardiology) Sable Feil, MD as Consulting Physician (Gastroenterology) Heath Lark, MD as Consulting Physician (Hematology and Oncology) Bensimhon, Shaune Pascal, MD as Consulting Physician (Cardiology)  Medication Review: Current Outpatient Prescriptions on File Prior to Visit  Medication Sig  . aspirin (ASPIR-LOW) 81 MG EC tablet Take 81 mg by mouth daily.    Marland Kitchen buPROPion (WELLBUTRIN XL) 300 MG 24 hr tablet TAKE 1 TABLET DAILY FOR MOOD  . cetirizine (ZYRTEC) 10 MG tablet Take 1 tablet (10 mg total) by mouth at bedtime.  Marland Kitchen desoximetasone (TOPICORT) 0.25 % cream Apply 1 application topically 2 (two) times daily.  . diclofenac sodium (VOLTAREN) 1 % GEL Apply 2 g topically 4 (four) times daily.  . fish oil-omega-3 fatty acids 1000 MG capsule Take 1 capsule by mouth 2 (two) times daily.    . Magnesium 250 MG TABS Take 1 tablet by mouth 2 (two) times daily.    . Multiple Vitamins-Minerals (STRESS B COMPLEX/ZINC) TABS Take 1 tablet by mouth daily.    . pantoprazole (PROTONIX) 40 MG tablet TAKE 1 TABLET DAILY FOR ACID REFLUX  . ranitidine (ZANTAC) 300 MG tablet TAKE 1 TABLET TWICE A DAY TO ALLOW TAPER OFF NEXIUM FOR HEARTBURN  . traMADol (ULTRAM) 50 MG tablet 1 tablet 2 x day for pain  . valsartan (DIOVAN) 320 MG tablet Take 1 tablet (320 mg total) by mouth daily.  . Testosterone (ANDROGEL PUMP) 20.25 MG/ACT (1.62%) GEL Apply 4 pumps daily  - disp 6 bottles (90 day supply) with  refills for 6 months   No current facility-administered medications on file prior to visit.     Current Problems (verified) Patient Active Problem List   Diagnosis Date Noted  . Obesity (BMI 30-39.9) 09/05/2015  . Other abnormal glucose 08/31/2015  . Erectile dysfunction 08/31/2015  . Encounter for Medicare annual wellness exam 05/28/2015  . 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 Medication management 11/19/2013  . Benign prostatic hyperplasia   . GERD   . Fibromyalgia   . Testosterone Deficiency   . Hyperlipidemia   . Vitamin D deficiency   . Essential hypertension 02/15/2010  . Hx of adenomatous polyp of colon 06/19/2007   Screening Tests Immunization History  Administered Date(s) Administered  . Influenza, High Dose Seasonal PF 08/13/2014, 08/31/2015  . Pneumococcal Conjugate-13 08/13/2014  . Pneumococcal Polysaccharide-23 07/12/2012  . Tdap 04/25/2009  . Zoster 07/27/2007   Preventative care: Last colonoscopy: 2016 Dr. Carlean Purl  History reviewed: allergies, current medications, past family history, past medical history, past social history, past surgical history and problem list  Allergies Allergies  Allergen Reactions  . Atenolol   . Atorvastatin   .  Darvocet [Propoxyphene N-Acetaminophen]   . Paroxetine   . Pregabalin   . Propoxyphene Hcl     SURGICAL HISTORY He  has a past surgical history that includes Cardiac catheterization (02/2010); Heel spur surgery (1960); Tonsillectomy (1962); Finger surgery (2000); and BIH REPAIR (08/2005). FAMILY HISTORY His family history includes Aortic stenosis in his father and other; Breast cancer in his mother and other; Diabetes in his other; Hypertension in his mother and other; Other in his other. SOCIAL HISTORY He  reports that he has never smoked. He has never used smokeless tobacco. He reports that he drinks about 7.2 oz of alcohol per week . He reports that he does not use drugs.  MEDICARE  WELLNESS OBJECTIVES: Physical activity: Current Exercise Habits: The patient has a physically strenous job, but has no regular exercise apart from work. Cardiac risk factors: Cardiac Risk Factors include: advanced age (>79men, >80 women);dyslipidemia;hypertension;male gender;sedentary lifestyle;obesity (BMI >30kg/m2) Depression/mood screen:   Depression screen [facility] 2/9 03/23/2017  Decreased Interest 0  Down, Depressed, Hopeless 0  PHQ - 2 Score 0    ADLs:  In your present state of health, do you have any difficulty performing the following activities: 03/23/2017 11/16/2016  Hearing? N N  Vision? N N  Difficulty concentrating or making decisions? N N  Walking or climbing stairs? N N  Dressing or bathing? N N  Doing errands, shopping? N N  Some recent data might be hidden     Cognitive Testing  Alert? Yes  Normal Appearance?Yes  Oriented to person? Yes  Place? Yes   Time? Yes  Recall of three objects?  Yes  Can perform simple calculations? Yes  Displays appropriate judgment?Yes  Can read the correct time from a watch face?Yes  EOL planning: Does Patient Have a Medical Advance Directive?: Yes Type of Advance Directive: Healthcare Power of Attorney, Living will [place] in Chart?: No - copy requested  Objective:     Blood pressure 120/80, pulse 61, temperature 97.9 F (36.6 C), resp. rate 18, height 5\' 10"  (1.778 m), weight 211 lb (95.7 kg), SpO2 96 %.   General Appearance:  Alert  WD/WN, male  in no apparent distress. Eyes: PERRLA, EOMs nl, conjunctiva normal, normal fundi and vessels. Sinuses: + frontal/maxillary tenderness ENT/Mouth: EACs patent / Lt. TM  Nl. & Rt TM dull pink w/splayed reflex.  Nares clear without erythema, swelling, mucoid exudates. Oral hygiene is good. 2+ erythema, w/o exudate. Tongue normal, non-obstructing. Tonsils not swollen but are erythematous. Hearing normal.  Neck: Supple, thyroid normal. No bruits, nodes or  JVD. Respiratory: Respiratory effort normal.  BS equal and clear bilateral without rales, rhonci, wheezing or stridor. Cardio: Heart sounds are normal with regular rate and rhythm and no murmurs, rubs or gallops. Peripheral pulses are normal and equal bilaterally without edema. No aortic or femoral bruits. Chest: symmetric with normal excursions and percussion.  Abdomen: Distended, firm, soft, with nl bowel sounds, + bulging retractable hernia just superior to well healed surgical scar from previous hernia. Nontender, no guarding, rebound, hernias, masses, or organomegaly.  Lymphatics: Non tender without lymphadenopathy.  Musculoskeletal: Full ROM all peripheral extremities, joint stability, 5/5 strength, and normal gait. Skin: Warm and dry without rashes, lesions, cyanosis, clubbing or  ecchymosis.  Neuro: Cranial nerves intact, reflexes equal bilaterally. Normal muscle tone, no cerebellar symptoms. Sensation intact.  Pysch: Alert and oriented X 3 with normal affect, insight and judgment appropriate.   Medicare Attestation I have personally reviewed: The patient's  medical and social history Their use of alcohol, tobacco or illicit drugs Their current medications and supplements The patient's functional ability including ADLs,fall risks, home safety risks, cognitive, and hearing and visual impairment Diet and physical activities Evidence for depression or mood disorders  The patient's weight, height, BMI, and visual acuity have been recorded in the chart.  I have made referrals, counseling, and provided education to the patient based on review of the above and I have provided the patient with a written personalized care plan for preventive services.  Over 40 minutes of exam, counseling, chart review was performed.  Vicie Mutters, PA-C   03/23/2017

## 2017-03-23 ENCOUNTER — Ambulatory Visit (INDEPENDENT_AMBULATORY_CARE_PROVIDER_SITE_OTHER): Payer: Medicare Other | Admitting: Physician Assistant

## 2017-03-23 ENCOUNTER — Encounter: Payer: Self-pay | Admitting: Physician Assistant

## 2017-03-23 VITALS — BP 120/80 | HR 61 | Temp 97.9°F | Resp 18 | Ht 70.0 in | Wt 211.0 lb

## 2017-03-23 DIAGNOSIS — Z0001 Encounter for general adult medical examination with abnormal findings: Secondary | ICD-10-CM

## 2017-03-23 DIAGNOSIS — I10 Essential (primary) hypertension: Secondary | ICD-10-CM | POA: Diagnosis not present

## 2017-03-23 DIAGNOSIS — Z79899 Other long term (current) drug therapy: Secondary | ICD-10-CM

## 2017-03-23 DIAGNOSIS — E782 Mixed hyperlipidemia: Secondary | ICD-10-CM | POA: Diagnosis not present

## 2017-03-23 DIAGNOSIS — M797 Fibromyalgia: Secondary | ICD-10-CM | POA: Diagnosis not present

## 2017-03-23 DIAGNOSIS — K219 Gastro-esophageal reflux disease without esophagitis: Secondary | ICD-10-CM | POA: Diagnosis not present

## 2017-03-23 DIAGNOSIS — E291 Testicular hypofunction: Secondary | ICD-10-CM

## 2017-03-23 DIAGNOSIS — N529 Male erectile dysfunction, unspecified: Secondary | ICD-10-CM

## 2017-03-23 DIAGNOSIS — Z86010 Personal history of colonic polyps: Secondary | ICD-10-CM

## 2017-03-23 DIAGNOSIS — R6889 Other general symptoms and signs: Secondary | ICD-10-CM

## 2017-03-23 DIAGNOSIS — J029 Acute pharyngitis, unspecified: Secondary | ICD-10-CM

## 2017-03-23 DIAGNOSIS — R350 Frequency of micturition: Secondary | ICD-10-CM

## 2017-03-23 DIAGNOSIS — N401 Enlarged prostate with lower urinary tract symptoms: Secondary | ICD-10-CM

## 2017-03-23 DIAGNOSIS — R768 Other specified abnormal immunological findings in serum: Secondary | ICD-10-CM

## 2017-03-23 DIAGNOSIS — Z0000 Encounter for general adult medical examination without abnormal findings: Secondary | ICD-10-CM

## 2017-03-23 DIAGNOSIS — E559 Vitamin D deficiency, unspecified: Secondary | ICD-10-CM

## 2017-03-23 DIAGNOSIS — E669 Obesity, unspecified: Secondary | ICD-10-CM

## 2017-03-23 DIAGNOSIS — R7309 Other abnormal glucose: Secondary | ICD-10-CM

## 2017-03-23 LAB — HEPATIC FUNCTION PANEL
ALT: 31 U/L (ref 9–46)
AST: 21 U/L (ref 10–35)
Albumin: 3.9 g/dL (ref 3.6–5.1)
Alkaline Phosphatase: 50 U/L (ref 40–115)
Bilirubin, Direct: 0.2 mg/dL (ref <=0.2)
Indirect Bilirubin: 0.5 mg/dL (ref 0.2–1.2)
Total Bilirubin: 0.7 mg/dL (ref 0.2–1.2)
Total Protein: 5.8 g/dL — ABNORMAL LOW (ref 6.1–8.1)

## 2017-03-23 LAB — CBC WITH DIFFERENTIAL/PLATELET
Basophils Absolute: 0 cells/uL (ref 0–200)
Basophils Relative: 0 %
Eosinophils Absolute: 276 cells/uL (ref 15–500)
Eosinophils Relative: 4 %
HCT: 46.9 % (ref 38.5–50.0)
Hemoglobin: 16.1 g/dL (ref 13.2–17.1)
Lymphocytes Relative: 24 %
Lymphs Abs: 1656 cells/uL (ref 850–3900)
MCH: 32.5 pg (ref 27.0–33.0)
MCHC: 34.3 g/dL (ref 32.0–36.0)
MCV: 94.7 fL (ref 80.0–100.0)
MPV: 10.5 fL (ref 7.5–12.5)
Monocytes Absolute: 966 cells/uL — ABNORMAL HIGH (ref 200–950)
Monocytes Relative: 14 %
Neutro Abs: 4002 cells/uL (ref 1500–7800)
Neutrophils Relative %: 58 %
Platelets: 158 10*3/uL (ref 140–400)
RBC: 4.95 MIL/uL (ref 4.20–5.80)
RDW: 14.3 % (ref 11.0–15.0)
WBC: 6.9 10*3/uL (ref 3.8–10.8)

## 2017-03-23 LAB — BASIC METABOLIC PANEL WITH GFR
BUN: 17 mg/dL (ref 7–25)
CO2: 23 mmol/L (ref 20–31)
Calcium: 8.8 mg/dL (ref 8.6–10.3)
Chloride: 104 mmol/L (ref 98–110)
Creat: 1.14 mg/dL (ref 0.70–1.25)
GFR, Est African American: 75 mL/min (ref >=60)
GFR, Est Non African American: 65 mL/min (ref >=60)
Glucose, Bld: 98 mg/dL (ref 65–99)
Potassium: 4.2 mmol/L (ref 3.5–5.3)
Sodium: 139 mmol/L (ref 135–146)

## 2017-03-23 LAB — LIPID PANEL
Cholesterol: 206 mg/dL — ABNORMAL HIGH (ref <200)
HDL: 56 mg/dL (ref >40)
LDL Cholesterol: 111 mg/dL — ABNORMAL HIGH (ref <100)
Total CHOL/HDL Ratio: 3.7 Ratio (ref <5.0)
Triglycerides: 193 mg/dL — ABNORMAL HIGH (ref <150)
VLDL: 39 mg/dL — ABNORMAL HIGH (ref <30)

## 2017-03-23 LAB — TSH: TSH: 3.01 mIU/L (ref 0.40–4.50)

## 2017-03-23 MED ORDER — PREDNISONE 20 MG PO TABS: ORAL_TABLET | ORAL | 0 refills | Status: DC

## 2017-03-23 MED ORDER — AMOXICILLIN-POT CLAVULANATE 875-125 MG PO TABS: 1.0000 | ORAL_TABLET | Freq: Two times a day (BID) | ORAL | 0 refills | Status: DC

## 2017-03-23 NOTE — Patient Instructions (Addendum)
Zinc 20-50 mg a day Stop the gel And see if there is a difference  Make sure you are on an allergy pill, see below for more details. Please take the prednisone as directed below, this is NOT an antibiotic so you do NOT have to finish it. You can take it for a few days and stop it if you are doing better.   Please take the prednisone to help decrease inflammation and therefore decrease symptoms. Take it it with food to avoid GI upset. It can cause increased energy but on the other hand it can make it hard to sleep at night so please take it AT [place], it takes 8-12 hours to start working so it will NOT affect your sleeping if you take it at night with your food!!  If you are diabetic it will increase your sugars so decrease carbs and monitor your sugars closely.     Sinusitis, Adult Sinusitis is soreness and inflammation of your sinuses. Sinuses are hollow spaces in the bones around your face. Your sinuses are located:  Around your eyes.  In the middle of your forehead.  Behind your nose.  In your cheekbones.  Your sinuses and nasal passages are lined with a stringy fluid (mucus). Mucus normally drains out of your sinuses. When your nasal tissues become inflamed or swollen, the mucus can become trapped or blocked so air cannot flow through your sinuses. This allows bacteria, viruses, and funguses to grow, which leads to infection. Sinusitis can develop quickly and last for 7?10 days (acute) or for more than 12 weeks (chronic). Sinusitis often develops after a cold. What are the causes? This condition is caused by anything that creates swelling in the sinuses or stops mucus from draining, including:  Allergies.  Asthma.  Bacterial or viral infection.  Abnormally shaped bones between the nasal passages.  Nasal growths that contain mucus (nasal polyps).  Narrow sinus openings.  Pollutants, such as chemicals or irritants in the air.  A foreign object stuck in the  nose.  A fungal infection. This is rare.  What increases the risk? The following factors may make you more likely to develop this condition:  Having allergies or asthma.  Having had a recent cold or respiratory tract infection.  Having structural deformities or blockages in your nose or sinuses.  Having a weak immune system.  Doing a lot of swimming or diving.  Overusing nasal sprays.  Smoking.  What are the signs or symptoms? The main symptoms of this condition are pain and a feeling of pressure around the affected sinuses. Other symptoms include:  Upper toothache.  Earache.  Headache.  Bad breath.  Decreased sense of smell and taste.  A cough that may get worse at night.  Fatigue.  Fever.  Thick drainage from your nose. The drainage is often green and it may contain pus (purulent).  Stuffy nose or congestion.  Postnasal drip. This is when extra mucus collects in the throat or back of the nose.  Swelling and warmth over the affected sinuses.  Sore throat.  Sensitivity to light.  How is this diagnosed? This condition is diagnosed based on symptoms, a medical history, and a physical exam. To find out if your condition is acute or chronic, your health care provider may:  Look in your nose for signs of nasal polyps.  Tap over the affected sinus to check for signs of infection.  View the inside of your sinuses using an imaging device that has  a light attached (endoscope).  If your health care provider suspects that you have chronic sinusitis, you may also:  Be tested for allergies.  Have a sample of mucus taken from your nose (nasal culture) and checked for bacteria.  Have a mucus sample examined to see if your sinusitis is related to an allergy.  If your sinusitis does not respond to treatment and it lasts longer than 8 weeks, you may have an MRI or CT scan to check your sinuses. These scans also help to determine how severe your infection is. In  rare cases, a bone biopsy may be done to rule out more serious types of fungal sinus disease. How is this treated? Treatment for sinusitis depends on the cause and whether your condition is chronic or acute. If a virus is causing your sinusitis, your symptoms will go away on their own within 10 days. You may be given medicines to relieve your symptoms, including:  Topical nasal decongestants. They shrink swollen nasal passages and let mucus drain from your sinuses.  Antihistamines. These drugs block inflammation that is triggered by allergies. This can help to ease swelling in your nose and sinuses.  Topical nasal corticosteroids. These are nasal sprays that ease inflammation and swelling in your nose and sinuses.  Nasal saline washes. These rinses can help to get rid of thick mucus in your nose.  If your condition is caused by bacteria, you will be given an antibiotic medicine. If your condition is caused by a fungus, you will be given an antifungal medicine. Surgery may be needed to correct underlying conditions, such as narrow nasal passages. Surgery may also be needed to remove polyps. Follow these instructions at home: Medicines  Take, use, or apply over-the-counter and prescription medicines only as told by your health care provider. These may include nasal sprays.  If you were prescribed an antibiotic medicine, take it as told by your health care provider. Do not stop taking the antibiotic even if you start to feel better. Hydrate and Humidify  Drink enough water to keep your urine clear or pale yellow. Staying hydrated will help to thin your mucus.  Use a cool mist humidifier to keep the humidity level in your home above 50%.  Inhale steam for 10-15 minutes, 3-4 times a day or as told by your health care provider. You can do this in the bathroom while a hot shower is running.  Limit your exposure to cool or dry air. Rest  Rest as much as possible.  Sleep with your head raised  (elevated).  Make sure to get enough sleep each night. General instructions  Apply a warm, moist washcloth to your face 3-4 times a day or as told by your health care provider. This will help with discomfort.  Wash your hands often with soap and water to reduce your exposure to viruses and other germs. If soap and water are not available, use hand sanitizer.  Do not smoke. Avoid being around people who are smoking (secondhand smoke).  Keep all follow-up visits as told by your health care provider. This is important. Contact a health care provider if:  You have a fever.  Your symptoms get worse.  Your symptoms do not improve within 10 days. Get help right away if:  You have a severe headache.  You have persistent vomiting.  You have pain or swelling around your face or eyes.  You have vision problems.  You develop confusion.  Your neck is stiff.  You have  trouble breathing. This information is not intended to replace advice given to you by your health care provider. Make sure you discuss any questions you have with your health care provider. Document Released: 09/11/2005 Document Revised: 05/07/2016 Document Reviewed: 07/07/2015 Elsevier Interactive Patient Education  2017 [employer].

## 2017-03-24 LAB — MAGNESIUM: Magnesium: 1.9 mg/dL (ref 1.5–2.5)

## 2017-03-24 LAB — HEMOGLOBIN A1C
Hgb A1c MFr Bld: 5.2 % (ref <5.7)
Mean Plasma Glucose: 103 mg/dL

## 2017-03-26 NOTE — Progress Notes (Signed)
Pt aware of lab results & voiced understanding of those results.

## 2017-04-03 ENCOUNTER — Other Ambulatory Visit: Payer: Self-pay | Admitting: Physician Assistant

## 2017-04-03 ENCOUNTER — Telehealth: Payer: Self-pay

## 2017-04-03 DIAGNOSIS — M797 Fibromyalgia: Secondary | ICD-10-CM

## 2017-04-03 MED ORDER — PREDNISONE 20 MG PO TABS: ORAL_TABLET | ORAL | 0 refills | Status: DC

## 2017-04-03 MED ORDER — TRAMADOL HCL 50 MG PO TABS: ORAL_TABLET | ORAL | 1 refills | Status: DC

## 2017-04-03 MED ORDER — BUPROPION HCL ER (XL) 300 MG PO TB24: ORAL_TABLET | ORAL | 3 refills | Status: DC

## 2017-04-03 MED ORDER — PROMETHAZINE-DM 6.25-15 MG/5ML PO SYRP: 5.0000 mL | ORAL_SOLUTION | Freq: Four times a day (QID) | ORAL | 1 refills | Status: DC | PRN

## 2017-04-03 NOTE — Telephone Encounter (Signed)
Pt reports still not feeling well & has finished his 1st round of ABX & would like a refill on ABX for his cough.  Per provider a cough can last at times for 2wks. But if he should start to have any fever,chills, he should call back for an office visit. Pred. & cough syrup were sent to pharmacy. Pt agreed & hung up. Pt was also informed of his other [facility] that [facility].

## 2017-05-02 ENCOUNTER — Other Ambulatory Visit: Payer: Self-pay | Admitting: *Deleted

## 2017-05-02 MED ORDER — LOSARTAN POTASSIUM 100 MG PO TABS: 100.0000 mg | ORAL_TABLET | Freq: Every day | ORAL | 1 refills | Status: DC

## 2017-06-04 ENCOUNTER — Ambulatory Visit: Payer: Self-pay | Admitting: Internal Medicine

## 2017-07-05 ENCOUNTER — Ambulatory Visit (INDEPENDENT_AMBULATORY_CARE_PROVIDER_SITE_OTHER): Payer: Medicare Other | Admitting: Internal Medicine

## 2017-07-05 VITALS — BP 120/78 | HR 76 | Temp 97.5°F | Resp 18 | Ht 70.0 in | Wt 210.0 lb

## 2017-07-05 DIAGNOSIS — R7303 Prediabetes: Secondary | ICD-10-CM

## 2017-07-05 DIAGNOSIS — K219 Gastro-esophageal reflux disease without esophagitis: Secondary | ICD-10-CM

## 2017-07-05 DIAGNOSIS — E782 Mixed hyperlipidemia: Secondary | ICD-10-CM | POA: Diagnosis not present

## 2017-07-05 DIAGNOSIS — E559 Vitamin D deficiency, unspecified: Secondary | ICD-10-CM | POA: Diagnosis not present

## 2017-07-05 DIAGNOSIS — E291 Testicular hypofunction: Secondary | ICD-10-CM

## 2017-07-05 DIAGNOSIS — I10 Essential (primary) hypertension: Secondary | ICD-10-CM | POA: Diagnosis not present

## 2017-07-05 DIAGNOSIS — J0111 Acute recurrent frontal sinusitis: Secondary | ICD-10-CM | POA: Diagnosis not present

## 2017-07-05 DIAGNOSIS — Z79899 Other long term (current) drug therapy: Secondary | ICD-10-CM | POA: Diagnosis not present

## 2017-07-05 MED ORDER — PREDNISONE 20 MG PO TABS: ORAL_TABLET | ORAL | 1 refills | Status: DC

## 2017-07-05 MED ORDER — AZITHROMYCIN 250 MG PO TABS: ORAL_TABLET | ORAL | 1 refills | Status: DC

## 2017-07-05 NOTE — Patient Instructions (Signed)
,Recommend Adult Low Dose Aspirin or  coated  Aspirin 81 mg daily  To reduce risk of Colon Cancer 20 %,  Skin Cancer 26 % ,  Melanoma 46%  and  Pancreatic cancer 60% +++++++++++++++++++++++++ Vitamin D goal  is between 70-100.  Please make sure that you are taking your Vitamin D as directed.  It is very important as a natural anti-inflammatory  helping hair, skin, and nails, as well as reducing stroke and heart attack risk.  It helps your bones and helps with mood. It also decreases numerous cancer risks so please take it as directed.  Low Vit D is associated with a 200-300% higher risk for CANCER  and 200-300% higher risk for HEART   ATTACK  &  STROKE.   ...................................... It is also associated with higher death rate at younger ages,  autoimmune diseases like Rheumatoid arthritis, Lupus, Multiple Sclerosis.    Also many other serious conditions, like depression, Alzheimer's Dementia, infertility, muscle aches, fatigue, fibromyalgia - just to name a few. ++++++++++++++++++++ Recommend the book "The END of DIETING" by Dr Joel Fuhrman  & the book "The END of DIABETES " by Dr Joel Fuhrman At [employer] - get book & Audio CD's    Being diabetic has a  300% increased risk for heart attack, stroke, cancer, and alzheimer- type vascular dementia. It is very important that you work harder with diet by avoiding all foods that are white. Avoid white rice (brown & wild rice is OK), white potatoes (sweetpotatoes in moderation is OK), White bread or wheat bread or anything made out of white flour like bagels, donuts, rolls, buns, biscuits, cakes, pastries, cookies, pizza crust, and pasta (made from white flour & egg whites) - vegetarian pasta or spinach or wheat pasta is OK. Multigrain breads like Arnold's or [employer], or multigrain sandwich thins or flatbreads.  Diet, exercise and weight loss can reverse and cure diabetes in the early stages.  Diet, exercise and weight loss is  very important in the control and prevention of complications of diabetes which affects every system in your body, ie. Brain - dementia/stroke, eyes - glaucoma/blindness, heart - heart attack/heart failure, kidneys - dialysis, stomach - gastric paralysis, intestines - malabsorption, nerves - severe painful neuritis, circulation - gangrene & loss of a leg(s), and finally cancer and Alzheimers.    I recommend avoid fried & greasy foods,  sweets/candy, white rice (brown or wild rice or Quinoa is OK), white potatoes (sweet potatoes are OK) - anything made from white flour - bagels, doughnuts, rolls, buns, biscuits,white and wheat breads, pizza crust and traditional pasta made of white flour & egg white(vegetarian pasta or spinach or wheat pasta is OK).  Multi-grain bread is OK - like multi-grain flat bread or sandwich thins. Avoid alcohol in excess. Exercise is also important.    Eat all the vegetables you want - avoid meat, especially red meat and dairy - especially cheese.  Cheese is the most concentrated form of trans-fats which is the worst thing to clog up our arteries. Veggie cheese is OK which can be found in the fresh produce section at [employer] or [employer] or [employer]  +++++++++++++++++++++ DASH Eating Plan  DASH stands for "Dietary Approaches to Stop Hypertension."   The DASH eating plan is a healthy eating plan that has been shown to reduce high blood pressure (hypertension). Additional health benefits may include reducing the risk of type 2 diabetes mellitus, heart disease, and stroke. The DASH eating plan may also   help with weight loss. WHAT DO I NEED TO KNOW ABOUT THE DASH EATING PLAN? For the DASH eating plan, you will follow these general guidelines:  Choose foods with a percent daily value for sodium of less than 5% (as listed on the food label).  Use salt-free seasonings or herbs instead of table salt or sea salt.  Check with your health care provider or pharmacist before  using salt substitutes.  Eat lower-sodium products, often labeled as "lower sodium" or "no salt added."  Eat fresh foods.  Eat more vegetables, fruits, and low-fat dairy products.  Choose whole grains. Look for the word "whole" as the first word in the ingredient list.  Choose fish   Limit sweets, desserts, sugars, and sugary drinks.  Choose heart-healthy fats.  Eat veggie cheese   Eat more home-cooked food and less restaurant, buffet, and fast food.  Limit fried foods.  Cook foods using methods other than frying.  Limit canned vegetables. If you do use them, rinse them well to decrease the sodium.  When eating at a restaurant, ask that your food be prepared with less salt, or no salt if possible.                      WHAT FOODS CAN I EAT? Read Dr Joel Fuhrman's books on The End of Dieting & The End of Diabetes  Grains Whole grain or whole wheat bread. Brown rice. Whole grain or whole wheat pasta. Quinoa, bulgur, and whole grain cereals. Low-sodium cereals. Corn or whole wheat flour tortillas. Whole grain cornbread. Whole grain crackers. Low-sodium crackers.  Vegetables Fresh or frozen vegetables (raw, steamed, roasted, or grilled). Low-sodium or reduced-sodium tomato and vegetable juices. Low-sodium or reduced-sodium tomato sauce and paste. Low-sodium or reduced-sodium canned vegetables.   Fruits All fresh, canned (in natural juice), or frozen fruits.  Protein Products  All fish and seafood.  Dried beans, peas, or lentils. Unsalted nuts and seeds. Unsalted canned beans.  Dairy Low-fat dairy products, such as skim or 1% milk, 2% or reduced-fat cheeses, low-fat ricotta or cottage cheese, or plain low-fat yogurt. Low-sodium or reduced-sodium cheeses.  Fats and Oils Tub margarines without trans fats. Light or reduced-fat mayonnaise and salad dressings (reduced sodium). Avocado. Safflower, olive, or canola oils. Natural peanut or almond butter.  Other Unsalted popcorn  and pretzels. The items listed above may not be a complete list of recommended foods or beverages. Contact your dietitian for more options.  +++++++++++++++  WHAT FOODS ARE NOT RECOMMENDED? Grains/ White flour or wheat flour White bread. White pasta. White rice. Refined cornbread. Bagels and croissants. Crackers that contain trans fat.  Vegetables  Creamed or fried vegetables. Vegetables in a . Regular canned vegetables. Regular canned tomato sauce and paste. Regular tomato and vegetable juices.  Fruits Dried fruits. Canned fruit in light or heavy syrup. Fruit juice.  Meat and Other Protein Products Meat in general - RED meat & White meat.  Fatty cuts of meat. Ribs, chicken wings, all processed meats as bacon, sausage, bologna, salami, fatback, hot dogs, bratwurst and packaged luncheon meats.  Dairy Whole or 2% milk, cream, half-and-half, and cream cheese. Whole-fat or sweetened yogurt. Full-fat cheeses or blue cheese. Non-dairy creamers and whipped toppings. Processed cheese, cheese spreads, or cheese curds.  Condiments Onion and garlic salt, seasoned salt, table salt, and sea salt. Canned and packaged gravies. Worcestershire sauce. Tartar sauce. Barbecue sauce. Teriyaki sauce. Soy sauce, including reduced sodium. Steak sauce. Fish sauce. Oyster sauce. Cocktail   sauce. Horseradish. Ketchup and mustard. Meat flavorings and tenderizers. Bouillon cubes. Hot sauce. Tabasco sauce. Marinades. Taco seasonings. Relishes.  Fats and Oils Butter, stick margarine, lard, shortening and bacon fat. Coconut, palm kernel, or palm oils. Regular salad dressings.  Pickles and olives. Salted popcorn and pretzels.  The items listed above may not be a complete list of foods and beverages to avoid.   

## 2017-07-05 NOTE — Progress Notes (Signed)
This very nice 71 y.o. MWM presents for 6 month follow up with Hypertension, Hyperlipidemia, Pre-Diabetes, Testosterone Deficiency  and Vitamin D Deficiency. Today, he's also c/o sinus pressure congestion, HA and purulent nasal secretions.      Patient is treated for HTN (1994) & BP has been controlled at home. Today's BP is at goal -  120/78.  Patient had a negative Heart Cath in 2011. Patient has had no complaints of any cardiac type chest pain, palpitations, dyspnea/orthopnea/PND, dizziness, claudication, or dependent edema.     Hyperlipidemia is not controlled with diet & meds. Patient denies myalgias or other med SE's. Last Lipids were not at goal: Lab Results  Component Value Date   CHOL 206 (H) 03/23/2017   HDL 56 03/23/2017   LDLCALC 111 (H) 03/23/2017   LDLDIRECT 151.0 02/10/2010   TRIG 193 (H) 03/23/2017   CHOLHDL 3.7 03/23/2017      Patient has been on Testosterone Replacement since 2013 with improved stamina and sense of well-being. Also, the patient is monitored expectantly for PreDiabetes and has had no symptoms of reactive hypoglycemia, diabetic polys, paresthesias or visual blurring.  Last A1c was at goal: Lab Results  Component Value Date   HGBA1C 5.2 03/23/2017      Further, the patient also has history of Vitamin D Deficiency ("38" / 2016)  and supplements vitamin D without any suspected side-effects. Last vitamin D was at goal:  Lab Results  Component Value Date   VD25OH 89 11/16/2016   Current Outpatient Prescriptions on File Prior to Visit  Medication Sig  . aspirin (ASPIR-LOW) 81 MG EC tablet Take 81 mg by mouth daily.    Marland Kitchen buPROPion (WELLBUTRIN XL) 300 MG 24 hr tablet TAKE 1 TABLET DAILY FOR MOOD  . cetirizine (ZYRTEC) 10 MG tablet Take 1 tablet (10 mg total) by mouth at bedtime.  Marland Kitchen desoximetasone (TOPICORT) 0.25 % cream Apply 1 application topically 2 (two) times daily.  . diclofenac sodium (VOLTAREN) 1 % GEL Apply 2 g topically 4 (four) times daily.  .  fish oil-omega-3 fatty acids 1000 MG capsule Take 1 capsule by mouth 2 (two) times daily.    . Magnesium 250 MG TABS Take 1 tablet by mouth 2 (two) times daily.    . Multiple Vitamins-Minerals (STRESS B COMPLEX/ZINC) TABS Take 1 tablet by mouth daily.    . pantoprazole (PROTONIX) 40 MG tablet TAKE 1 TABLET DAILY FOR ACID REFLUX  . ranitidine (ZANTAC) 300 MG tablet TAKE 1 TABLET TWICE A DAY TO ALLOW TAPER OFF NEXIUM FOR HEARTBURN  . traMADol (ULTRAM) 50 MG tablet 1 tablet 2 x day for pain  . losartan (COZAAR) 100 MG tablet Take 1 tablet (100 mg total) by mouth daily. (Patient not taking: Reported on 07/05/2017)  . Testosterone (ANDROGEL PUMP) 20.25 MG/ACT (1.62%) GEL Apply 4 pumps daily  - disp 6 bottles (90 day supply) with refills for 6 months   No current facility-administered medications on file prior to visit.    Allergies  Allergen Reactions  . Atenolol   . Atorvastatin   . Darvocet [Propoxyphene N-Acetaminophen]   . Paroxetine   . Pregabalin   . Propoxyphene Hcl    PMHx:   Past Medical History:  Diagnosis Date  . BPH (benign prostatic hypertrophy)   . 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 Erythrocytosis 04/28/2014  . Exertional dyspnea    ETT myoview 5/11: 8 min, no ischemic ECG changes, stopped due to fatigue/ SOB.  EF 60%, no ischemia or infarction. Echo 5/11 EF 60%, no significant valvular abnormalities, mild RV dilation and dysfunction, no complete TR doppler jet so PA pressure hard to gauge. 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 R heart cath 6/11: mean RA 10 mmHg, PA 25/18, mean PCWP 13 mmHg, CI 2.1  . Fibromyalgia   . GERD (gastroesophageal reflux disease)   . History of epididymitis   . History of head injury without skull fracture 1980   Concussion  . Hx of adenomatous polyp of colon 06/19/2007  . Hyperlipidemia    Myalgias with Lipitor  . Hypertension   . Hypogonadism male   . Myofascial pain syndrome   .  Nephrolithiasis   . Prolapsed internal hemorrhoids   . Prostatitis    Recurrent  . Rheumatic fever    x3 at ages [age] and 69; No significant valvular abnormality on 5/11 echo  . Tendinitis 2000   Hx of left bicep  . Vitamin D deficiency    Immunization History  Administered Date(s) Administered  . Influenza, High Dose Seasonal PF 08/13/2014, 08/31/2015  . Pneumococcal Conjugate-13 08/13/2014  . Pneumococcal Polysaccharide-23 07/12/2012  . Tdap 04/25/2009  . Zoster 07/27/2007   Past Surgical History:  Procedure Laterality Date  . BIH REPAIR  08/2005   Incarverated fat by mesh repair  . CARDIAC CATHETERIZATION  02/2010   Left and right  . FINGER SURGERY  2000   Repair of injury to right fifth finger  . HEEL SPUR SURGERY  1960   Bilateral  . TONSILLECTOMY  1962   FHx:    Reviewed / unchanged  SHx:    Reviewed / unchanged  Systems Review:  Constitutional: Denies fever, chills, wt changes, headaches, insomnia, fatigue, night sweats, change in appetite. Eyes: Denies redness, blurred vision, diplopia, discharge, itchy, watery eyes.  ENT: Denies discharge, congestion, post nasal drip, epistaxis, sore throat, earache, hearing loss, dental pain, tinnitus, vertigo, sinus pain, snoring.  CV: Denies chest pain, palpitations, irregular heartbeat, syncope, dyspnea, diaphoresis, orthopnea, PND, claudication or edema. Respiratory: denies cough, dyspnea, DOE, pleurisy, hoarseness, laryngitis, wheezing.  Gastrointestinal: Denies dysphagia, odynophagia, heartburn, reflux, water brash, abdominal pain or cramps, nausea, vomiting, bloating, diarrhea, constipation, hematemesis, melena, hematochezia  or hemorrhoids. Genitourinary: Denies dysuria, frequency, urgency, nocturia, hesitancy, discharge, hematuria or flank pain. Musculoskeletal: Denies arthralgias, myalgias, stiffness, jt. swelling, pain, limping or strain/sprain.  Skin: Denies pruritus, rash, hives, warts, acne, eczema or change in skin  lesion(s). Neuro: No weakness, tremor, incoordination, spasms, paresthesia or pain. Psychiatric: Denies confusion, memory loss or sensory loss. Endo: Denies change in weight, skin or hair change.  Heme/Lymph: No excessive bleeding, bruising or enlarged lymph nodes.  Physical Exam  BP 120/78   Pulse 76   Temp (!) 97.5 F (36.4 C)   Resp 18   Ht 5\' 10"  (1.778 m)   Wt 210 lb (95.3 kg)   BMI 30.13 kg/m   Appears well nourished, well groomed  and in no distress.  Eyes: PERRLA, EOMs, conjunctiva no swelling or erythema. Sinuses: (+) frontal/maxillary tenderness. ENT/Mouth: EAC's clear, TM's nl w/o erythema, bulging. Nares clear w/o erythema, swelling, exudates. Oropharynx clear without erythema or exudates. Oral hygiene is good. Tongue normal, non obstructing. Hearing intact.  Neck: Supple. Thyroid nl. Car 2+/2+ without bruits, nodes or JVD. Chest: Respirations nl with BS clear & equal w/o rales, rhonchi, wheezing or stridor.  Cor: Heart sounds normal w/ regular rate and rhythm without sig. murmurs, gallops, clicks or rubs.  Peripheral pulses normal and equal  without edema.  Abdomen: Soft & bowel sounds normal. Non-tender w/o guarding, rebound, hernias, masses or organomegaly.  Lymphatics: Unremarkable.  Musculoskeletal: Full ROM all peripheral extremities, joint stability, 5/5 strength and normal gait.  Skin: Warm, dry without exposed rashes, lesions or ecchymosis apparent.  Neuro: Cranial nerves intact, reflexes equal bilaterally. Sensory-motor testing grossly intact. Tendon reflexes grossly intact.  Pysch: Alert & oriented x 3.  Insight and judgement nl & appropriate. No ideations.  Assessment and Plan:  1. Essential hypertension  - Continue medication, monitor blood pressure at home.  - Continue DASH diet. Reminder to go to the ER if any CP,  SOB, nausea, dizziness, severe HA, changes vision/speech.  - CBC with Differential/Platelet - BASIC METABOLIC PANEL WITH GFR -  Magnesium - TSH  2. Hyperlipidemia, mixed  - Continue diet/meds, exercise,& lifestyle modifications.  - Continue monitor periodic cholesterol/liver & renal functions   - Hepatic function panel - Lipid panel - TSH  3. Prediabetes  - Continue diet, exercise, lifestyle modifications.  - Monitor appropriate labs.  - Hemoglobin A1c - Insulin, random  4. Vitamin D deficiency  - Continue supplementation. - VITAMIN D 25 Hydroxy  5. Gastroesophageal reflux disease  - CBC with Differential/Platelet  6. Testosterone Deficiency  - Testosterone  7. Acute recurrent frontal sinusitis  - predniSONE  20 MG ; 1 tab 3 x day for 3 days, then 1 tab 2 x day for 3 days, then 1 tab 1 x day for 5 days  Dispense: 20 tab; Refill: 1 - azithromycin  250 MG ; Take 2 tablets  on  Day 1,  followed by 1 tablet once daily on Days 2 through 5.  Dispense: 6 each; Refill: 1  8. Medication management  - CBC with Differential/Platelet - BASIC METABOLIC PANEL WITH GFR - Hepatic function panel - Magnesium - Lipid panel - TSH - Hemoglobin A1c - Insulin, random - VITAMIN D 25 Hydroxy  - Testosterone      Discussed  regular exercise, BP monitoring, weight control to achieve/maintain BMI less than 25 and discussed med and SE's. Recommended labs to assess and monitor clinical status with further disposition pending results of labs. Over 30 minutes of exam, counseling, chart review was performed.

## 2017-07-06 ENCOUNTER — Other Ambulatory Visit: Payer: Self-pay | Admitting: *Deleted

## 2017-07-06 ENCOUNTER — Encounter: Payer: Self-pay | Admitting: Internal Medicine

## 2017-07-06 DIAGNOSIS — E291 Testicular hypofunction: Secondary | ICD-10-CM

## 2017-07-06 LAB — BASIC METABOLIC PANEL WITH GFR
BUN/Creatinine Ratio: 12 (calc) (ref 6–22)
BUN: 17 mg/dL (ref 7–25)
CO2: 23 mmol/L (ref 20–32)
Calcium: 9 mg/dL (ref 8.6–10.3)
Chloride: 105 mmol/L (ref 98–110)
Creat: 1.39 mg/dL — ABNORMAL HIGH (ref 0.70–1.18)
GFR, Est African American: 59 mL/min/{1.73_m2} — ABNORMAL LOW (ref >=60)
GFR, Est Non African American: 51 mL/min/{1.73_m2} — ABNORMAL LOW (ref >=60)
Glucose, Bld: 117 mg/dL — ABNORMAL HIGH (ref 65–99)
Potassium: 4 mmol/L (ref 3.5–5.3)
Sodium: 138 mmol/L (ref 135–146)

## 2017-07-06 LAB — INSULIN, RANDOM: Insulin: 36.6 u[IU]/mL — ABNORMAL HIGH (ref 2.0–19.6)

## 2017-07-06 LAB — TSH: TSH: 2.06 mIU/L (ref 0.40–4.50)

## 2017-07-06 LAB — HEMOGLOBIN A1C
Hgb A1c MFr Bld: 5 % of total Hgb (ref <5.7)
Mean Plasma Glucose: 97 (calc)
eAG (mmol/L): 5.4 (calc)

## 2017-07-06 LAB — CBC WITH DIFFERENTIAL/PLATELET
Basophils Absolute: 42 cells/uL (ref 0–200)
Basophils Relative: 0.6 %
Eosinophils Absolute: 315 cells/uL (ref 15–500)
Eosinophils Relative: 4.5 %
HCT: 45.7 % (ref 38.5–50.0)
Hemoglobin: 16.6 g/dL (ref 13.2–17.1)
Lymphs Abs: 1197 cells/uL (ref 850–3900)
MCH: 33.5 pg — ABNORMAL HIGH (ref 27.0–33.0)
MCHC: 36.3 g/dL — ABNORMAL HIGH (ref 32.0–36.0)
MCV: 92.1 fL (ref 80.0–100.0)
MPV: 11.5 fL (ref 7.5–12.5)
Monocytes Relative: 9.8 %
Neutro Abs: 4760 cells/uL (ref 1500–7800)
Neutrophils Relative %: 68 %
Platelets: 179 10*3/uL (ref 140–400)
RBC: 4.96 10*6/uL (ref 4.20–5.80)
RDW: 12.5 % (ref 11.0–15.0)
Total Lymphocyte: 17.1 %
WBC mixed population: 686 cells/uL (ref 200–950)
WBC: 7 10*3/uL (ref 3.8–10.8)

## 2017-07-06 LAB — LIPID PANEL
Cholesterol: 197 mg/dL (ref <200)
HDL: 42 mg/dL (ref >40)
Non-HDL Cholesterol (Calc): 155 mg/dL (calc) — ABNORMAL HIGH (ref <130)
Total CHOL/HDL Ratio: 4.7 (calc) (ref <5.0)
Triglycerides: 429 mg/dL — ABNORMAL HIGH (ref <150)

## 2017-07-06 LAB — HEPATIC FUNCTION PANEL
AG Ratio: 2 (calc) (ref 1.0–2.5)
ALT: 22 U/L (ref 9–46)
AST: 21 U/L (ref 10–35)
Albumin: 3.9 g/dL (ref 3.6–5.1)
Alkaline phosphatase (APISO): 47 U/L (ref 40–115)
Bilirubin, Direct: 0.1 mg/dL (ref 0.0–0.2)
Globulin: 2 g/dL (calc) (ref 1.9–3.7)
Indirect Bilirubin: 0.6 mg/dL (calc) (ref 0.2–1.2)
Total Bilirubin: 0.7 mg/dL (ref 0.2–1.2)
Total Protein: 5.9 g/dL — ABNORMAL LOW (ref 6.1–8.1)

## 2017-07-06 LAB — TESTOSTERONE: Testosterone: 630 ng/dL (ref 250–827)

## 2017-07-06 LAB — VITAMIN D 25 HYDROXY (VIT D DEFICIENCY, FRACTURES): Vit D, 25-Hydroxy: 57 ng/mL (ref 30–100)

## 2017-07-06 LAB — MAGNESIUM: Magnesium: 1.9 mg/dL (ref 1.5–2.5)

## 2017-07-06 MED ORDER — TESTOSTERONE 20.25 MG/ACT (1.62%) TD GEL: TRANSDERMAL | 1 refills | Status: DC

## 2017-07-19 ENCOUNTER — Other Ambulatory Visit: Payer: Self-pay | Admitting: Physician Assistant

## 2017-07-19 DIAGNOSIS — K219 Gastro-esophageal reflux disease without esophagitis: Secondary | ICD-10-CM

## 2017-07-21 ENCOUNTER — Other Ambulatory Visit: Payer: Self-pay | Admitting: Internal Medicine

## 2017-07-21 DIAGNOSIS — J0111 Acute recurrent frontal sinusitis: Secondary | ICD-10-CM

## 2017-07-27 ENCOUNTER — Other Ambulatory Visit: Payer: Self-pay | Admitting: *Deleted

## 2017-07-27 DIAGNOSIS — K219 Gastro-esophageal reflux disease without esophagitis: Secondary | ICD-10-CM

## 2017-07-27 MED ORDER — RANITIDINE HCL 300 MG PO TABS: ORAL_TABLET | ORAL | 1 refills | Status: DC

## 2017-10-11 NOTE — Progress Notes (Signed)
FOLLOW UP  Assessment and Plan:   Hypertension Above goal with current medications; adding hctz 25 mg daily Monitor blood pressure at home; patient to call if consistently greater than 130/80 Continue DASH diet.   Reminder to go to the ER if any CP, SOB, nausea, dizziness, severe HA, changes vision/speech, left arm numbness and tingling and jaw pain.  Cholesterol Currently above goal; mild LDL elevations and severe triglycerides - fenofibrate discussed should triglycerides not be significantly improved on today's check  Continue low cholesterol diet and exercise.  Check lipid panel.   Other abnormal glucose A1Cs recently well controlled Continue diet and exercise.  Perform daily foot/skin check, notify office of any concerning changes.  Defer A1C; check BMP  Obesity with co morbidities Long discussion about weight loss, diet, and exercise Recommended diet heavy in fruits and veggies and low in animal meats, cheeses, and dairy products, appropriate calorie intake Discussed ideal weight for height  Patient unwilling to initiate any changes at this time Will follow up in 3 months  Vitamin D Def Not at goal at last visit; increase supplementation for goal of 70-100 Defer Vit D level  Cough present for greater than 3 weeks -     DG Chest 2 View; Future Try adding allegra x 2 weeks, then try adding nexium BID x 2 weeks.  Follow up at next visit with results.   Right-sided chest wall pain -     DG Chest 2 View; Future  Impacted cerumen of left ear Irrigated, information for home cleaning provided  Decreased hearing, unspecified laterality Present for hearing test as discussed   Continue diet and meds as discussed. Further disposition pending results of labs. Discussed med's effects and SE's.   Over 30 minutes of exam, counseling, chart review, and critical decision making was performed.   Future Appointments  Date Time Provider [place]  01/10/2018 11:00 AM  [name], MD [facility] None    ----------------------------------------------------------------------------------------------------------------------  HPI 72 y.o. male  presents for 3 month follow up on hypertension, cholesterol,glucose management, obesity and vitamin D deficiency. He also endorses a intermittent cough x 6 months or so - he also endorses runny nose and sinus congestion intermittently ongoing.   BMI is Body mass index is 30.85 kg/m., he has not been working on diet and exercise - works a physically intense job.  Wt Readings from Last 3 Encounters:  10/12/17 215 lb (97.5 kg)  07/05/17 210 lb (95.3 kg)  03/23/17 211 lb (95.7 kg)   he has a diagnosis of GERD which is currently managed by ranitidine 300 mg BID. he reports symptoms is currently well controlled, and denies breakthrough reflux, burning in chest, hoarseness.    He does not check his blood pressure, today their BP is BP: (!) 142/86  He does not workout. He denies chest pain, shortness of breath, dizziness.   He is not on cholesterol medication (supplements omega 3) and denies myalgias. His cholesterol is not at goal. The cholesterol last visit was:   Lab Results  Component Value Date   CHOL 197 07/05/2017   HDL 42 07/05/2017   LDLCALC 111 (H) 03/23/2017   LDLDIRECT 151.0 02/10/2010   TRIG 429 (H) 07/05/2017   CHOLHDL 4.7 07/05/2017    He has not been working on diet and exercise for glucose management, and denies increased appetite, nausea, paresthesia of the feet, polydipsia, polyuria, visual disturbances and vomiting. Last A1C in the office was:  Lab Results  Component Value Date  HGBA1C 5.0 07/05/2017   Patient is on Vitamin D supplement but below goal at last visit. Dose was changed after the last visit:    Lab Results  Component Value Date   VD25OH 57 07/05/2017        Current Medications:  Current Outpatient Medications on File Prior to Visit  Medication Sig  . aspirin (ASPIR-LOW)  81 MG EC tablet Take 81 mg by mouth daily.    Marland Kitchen azithromycin (ZITHROMAX) 250 MG tablet TAKE 2 TABLETS BY MOUTH TODAY, THEN TAKE 1 TABLET DAILY FOR 4 DAYS  . buPROPion (WELLBUTRIN XL) 300 MG 24 hr tablet TAKE 1 TABLET DAILY FOR MOOD  . cetirizine (ZYRTEC) 10 MG tablet Take 1 tablet (10 mg total) by mouth at bedtime.  Marland Kitchen desoximetasone (TOPICORT) 0.25 % cream Apply 1 application topically 2 (two) times daily.  . diclofenac sodium (VOLTAREN) 1 % GEL Apply 2 g topically 4 (four) times daily.  . fish oil-omega-3 fatty acids 1000 MG capsule Take 1 capsule by mouth 2 (two) times daily.    Marland Kitchen losartan (COZAAR) 100 MG tablet Take 1 tablet (100 mg total) by mouth daily.  . Magnesium 250 MG TABS Take 1 tablet by mouth 2 (two) times daily.    . Multiple Vitamins-Minerals (STRESS B COMPLEX/ZINC) TABS Take 1 tablet by mouth daily.    . pantoprazole (PROTONIX) 40 MG tablet TAKE 1 TABLET DAILY FOR ACID REFLUX  . predniSONE (DELTASONE) 20 MG tablet TAKE 1 TABLET 3 X DAY FOR 3 DAYS, THEN 1 TABLET 2 X DAY FOR 3 DAYS, THEN 1 TABLE 1 X DAY FOR 5 DAYS  . ranitidine (ZANTAC) 300 MG tablet TAKE 1 TABLET TWICE A DAY TO ALLOW TAPER OFF NEXIUM FOR HEARTBURN  . Testosterone (ANDROGEL PUMP) 20.25 MG/ACT (1.62%) GEL Apply 4 pumps daily  - disp 6 bottles (90 day supply) with refills for 6 months  . traMADol (ULTRAM) 50 MG tablet 1 tablet 2 x day for pain  . valACYclovir (VALTREX) 1000 MG tablet    No current facility-administered medications on file prior to visit.      Allergies:  Allergies  Allergen Reactions  . Atenolol   . Atorvastatin   . Darvocet [Propoxyphene N-Acetaminophen]   . Paroxetine   . Pregabalin   . Propoxyphene Hcl      Medical History:  Past Medical History:  Diagnosis Date  . BPH (benign prostatic hypertrophy)   . 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 Erythrocytosis 04/28/2014  . Exertional dyspnea    ETT myoview 5/11: 8 min, no ischemic ECG changes, stopped due to  fatigue/ SOB. EF 60%, no ischemia or infarction. Echo 5/11 EF 60%, no significant valvular abnormalities, mild RV dilation and dysfunction, no complete TR doppler jet so PA pressure hard to gauge. 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 R heart cath 6/11: mean RA 10 mmHg, PA 25/18, mean PCWP 13 mmHg, CI 2.1  . Fibromyalgia   . GERD (gastroesophageal reflux disease)   . History of epididymitis   . History of head injury without skull fracture 1980   Concussion  . Hx of adenomatous polyp of colon 06/19/2007  . Hyperlipidemia    Myalgias with Lipitor  . Hypertension   . Hypogonadism male   . Myofascial pain syndrome   . Nephrolithiasis   . Prolapsed internal hemorrhoids   . Prostatitis    Recurrent  . Rheumatic fever    x3 at ages [age] and 50;  No significant valvular abnormality on 5/11 echo  . Tendinitis 2000   Hx of left bicep  . Vitamin D deficiency    Family history- Reviewed and unchanged Social history- Reviewed and unchanged   Review of Systems:  Review of Systems  Constitutional: Negative for malaise/fatigue and weight loss.  HENT: Positive for congestion, hearing loss (Chronic ongoing, has never had hearing checked) and tinnitus (Chronic ongoing). Negative for ear discharge, ear pain, nosebleeds, sinus pain and sore throat.   Eyes: Negative for blurred vision and double vision.  Respiratory: Positive for cough. Negative for shortness of breath, wheezing and stridor.   Cardiovascular: Negative for chest pain, palpitations, orthopnea, claudication and leg swelling.  Gastrointestinal: Negative for abdominal pain, blood in stool, constipation, diarrhea, heartburn, melena, nausea and vomiting.  Genitourinary: Negative.   Musculoskeletal: Negative for joint pain and myalgias.  Skin: Negative for rash.  Neurological: Negative for dizziness, tingling, sensory change, weakness and headaches.  Endo/Heme/Allergies: Negative for polydipsia.   Psychiatric/Behavioral: Negative.   All other systems reviewed and are negative.    Physical Exam: BP (!) 142/86   Pulse 81   Temp 98.1 F (36.7 C)   Ht 5\' 10"  (1.778 m)   Wt 215 lb (97.5 kg)   SpO2 96%   BMI 30.85 kg/m  Wt Readings from Last 3 Encounters:  10/12/17 215 lb (97.5 kg)  07/05/17 210 lb (95.3 kg)  03/23/17 211 lb (95.7 kg)   General Appearance: Well nourished, in no apparent distress. Eyes: PERRLA, EOMs, conjunctiva no swelling or erythema Sinuses: No Frontal/maxillary tenderness ENT/Mouth: R Ext aud canal clear, L obstructed by dry cerumen. R TM without erythema, bulging. No erythema, swelling, or exudate on post pharynx.  Tonsils not swollen or erythematous. Hearing normal.  Neck: Supple, thyroid normal.  Respiratory: Respiratory effort normal, BS equal bilaterally without rales, rhonchi, wheezing or stridor. Somewhat coarse throughout. Cardio: RRR with no MRGs. Brisk peripheral pulses without edema.  Abdomen: Soft, + BS.  Non tender, no guarding, rebound, hernias, masses. Lymphatics: Non tender without lymphadenopathy.  Musculoskeletal: Full ROM, 5/5 strength, Normal gait Skin: Warm, dry without rashes, lesions, ecchymosis.  Neuro: Cranial nerves intact. No cerebellar symptoms.  Psych: Awake and oriented X 3, normal affect, Insight and Judgment appropriate.    Izora Ribas, NP 8:48 AM [facility]

## 2017-10-12 ENCOUNTER — Ambulatory Visit: Payer: Medicare Other | Admitting: Adult Health

## 2017-10-12 ENCOUNTER — Ambulatory Visit (HOSPITAL_COMMUNITY)
Admission: RE | Admit: 2017-10-12 | Discharge: 2017-10-12 | Disposition: A | Discharge status: Home / Self Care | Payer: Medicare Other | Source: Ambulatory Visit | Attending: Adult Health | Admitting: Adult Health

## 2017-10-12 ENCOUNTER — Encounter: Payer: Self-pay | Admitting: Adult Health

## 2017-10-12 VITALS — BP 142/86 | HR 81 | Temp 98.1°F | Ht 70.0 in | Wt 215.0 lb

## 2017-10-12 DIAGNOSIS — R05 Cough: Secondary | ICD-10-CM

## 2017-10-12 DIAGNOSIS — R079 Chest pain, unspecified: Secondary | ICD-10-CM | POA: Insufficient documentation

## 2017-10-12 DIAGNOSIS — I10 Essential (primary) hypertension: Secondary | ICD-10-CM | POA: Diagnosis not present

## 2017-10-12 DIAGNOSIS — R0789 Other chest pain: Secondary | ICD-10-CM | POA: Diagnosis not present

## 2017-10-12 DIAGNOSIS — R7309 Other abnormal glucose: Secondary | ICD-10-CM

## 2017-10-12 DIAGNOSIS — E782 Mixed hyperlipidemia: Secondary | ICD-10-CM | POA: Diagnosis not present

## 2017-10-12 DIAGNOSIS — H9190 Unspecified hearing loss, unspecified ear: Secondary | ICD-10-CM

## 2017-10-12 DIAGNOSIS — Z79899 Other long term (current) drug therapy: Secondary | ICD-10-CM | POA: Diagnosis not present

## 2017-10-12 DIAGNOSIS — R058 Other specified cough: Secondary | ICD-10-CM

## 2017-10-12 DIAGNOSIS — E559 Vitamin D deficiency, unspecified: Secondary | ICD-10-CM | POA: Diagnosis not present

## 2017-10-12 DIAGNOSIS — E669 Obesity, unspecified: Secondary | ICD-10-CM

## 2017-10-12 DIAGNOSIS — H6122 Impacted cerumen, left ear: Secondary | ICD-10-CM | POA: Diagnosis not present

## 2017-10-12 LAB — LIPID PANEL
Cholesterol: 210 mg/dL — ABNORMAL HIGH (ref <200)
HDL: 53 mg/dL (ref >40)
LDL Cholesterol (Calc): 125 mg/dL (calc) — ABNORMAL HIGH
Non-HDL Cholesterol (Calc): 157 mg/dL (calc) — ABNORMAL HIGH (ref <130)
Total CHOL/HDL Ratio: 4 (calc) (ref <5.0)
Triglycerides: 207 mg/dL — ABNORMAL HIGH (ref <150)

## 2017-10-12 LAB — CBC WITH DIFFERENTIAL/PLATELET
Basophils Absolute: 48 cells/uL (ref 0–200)
Basophils Relative: 0.9 %
Eosinophils Absolute: 201 cells/uL (ref 15–500)
Eosinophils Relative: 3.8 %
HCT: 46.4 % (ref 38.5–50.0)
Hemoglobin: 16.6 g/dL (ref 13.2–17.1)
Lymphs Abs: 954 cells/uL (ref 850–3900)
MCH: 32.2 pg (ref 27.0–33.0)
MCHC: 35.8 g/dL (ref 32.0–36.0)
MCV: 89.9 fL (ref 80.0–100.0)
MPV: 11.1 fL (ref 7.5–12.5)
Monocytes Relative: 12.5 %
Neutro Abs: 3434 cells/uL (ref 1500–7800)
Neutrophils Relative %: 64.8 %
Platelets: 175 10*3/uL (ref 140–400)
RBC: 5.16 10*6/uL (ref 4.20–5.80)
RDW: 12.5 % (ref 11.0–15.0)
Total Lymphocyte: 18 %
WBC mixed population: 663 cells/uL (ref 200–950)
WBC: 5.3 10*3/uL (ref 3.8–10.8)

## 2017-10-12 LAB — BASIC METABOLIC PANEL WITH GFR
BUN/Creatinine Ratio: 16 (calc) (ref 6–22)
BUN: 19 mg/dL (ref 7–25)
CO2: 26 mmol/L (ref 20–32)
Calcium: 9 mg/dL (ref 8.6–10.3)
Chloride: 108 mmol/L (ref 98–110)
Creat: 1.21 mg/dL — ABNORMAL HIGH (ref 0.70–1.18)
GFR, Est African American: 70 mL/min/{1.73_m2} (ref >=60)
GFR, Est Non African American: 60 mL/min/{1.73_m2} (ref >=60)
Glucose, Bld: 100 mg/dL — ABNORMAL HIGH (ref 65–99)
Potassium: 4.5 mmol/L (ref 3.5–5.3)
Sodium: 142 mmol/L (ref 135–146)

## 2017-10-12 LAB — HEPATIC FUNCTION PANEL
AG Ratio: 2.7 (calc) — ABNORMAL HIGH (ref 1.0–2.5)
ALT: 35 U/L (ref 9–46)
AST: 23 U/L (ref 10–35)
Albumin: 4.3 g/dL (ref 3.6–5.1)
Alkaline phosphatase (APISO): 57 U/L (ref 40–115)
Bilirubin, Direct: 0.1 mg/dL (ref 0.0–0.2)
Globulin: 1.6 g/dL (calc) — ABNORMAL LOW (ref 1.9–3.7)
Indirect Bilirubin: 0.6 mg/dL (calc) (ref 0.2–1.2)
Total Bilirubin: 0.7 mg/dL (ref 0.2–1.2)
Total Protein: 5.9 g/dL — ABNORMAL LOW (ref 6.1–8.1)

## 2017-10-12 LAB — TSH: TSH: 1.84 mIU/L (ref 0.40–4.50)

## 2017-10-12 MED ORDER — HYDROCHLOROTHIAZIDE 25 MG PO TABS: ORAL_TABLET | ORAL | 1 refills | Status: DC

## 2017-10-12 NOTE — Patient Instructions (Addendum)
Adding hydrochlorothiazide 25 mg daily for blood pressure Check blood pressure regularly, call if greater than 130/80 consistently If this medication works well, we can prescribe a combination pill at the next visit.    Use a dropper or use a cap to put peroxide, olive oil,mineral oil or canola oil in the effected ear- 2-3 times a week. Let it soak for 20-30 min then you can take a shower or use a baby bulb with warm water to wash out the ear wax.  Do not use Qtips   [employer] with no obligation # [phone] Do not have to be a member Tues-Sat 10-6  [place]- free test with no obligation # [phone] MUST BE A MEMBER Call for store hours  Have had patient's get good cheaper hearing aids from [employer] The air version has good reviews.    Try aleve/ibuprofen/tylenol for rib/chest pain -   For your cough: please try allegra first for 2 weeks for cough Then try adding the over the counter nexium described below for 2 weeks   Common causes of cough OR hoarseness OR sore throat:   Allergies, Viral Infections, Acid Reflux and Bacterial Infections.   Allergies and viral infections cause a cough OR sore throat by post nasal drip and are often worse at night, can also have sneezing, lower grade fevers, clear/yellow mucus. This is best treated with allergy medications or nasal sprays.  Please get on allegra for 1-2 weeks The strongest is allegra or fexafinadine  Cheapest at [employer], [employer], [employer]   Bacterial infections are more severe than allergies or viral infections with fever, teeth pain, fatigue. This can be treated with prednisone and the same over the counter medication and after 7 days can be treated with an antibiotic.   Silent reflux/GERD can cause a cough OR sore throat OR hoarseness WITHOUT heart burn because the esophagus that goes to the stomach and trachea that goes to the lungs are very close and when you lay down the acid can  irritate your throat and lungs. This can cause hoarseness, cough, and wheezing. Please stop any alcohol or anti-inflammatories like aleve/advil/ibuprofen and start an over the counter Prilosec or omeprazole 1-2 times daily 50mins before food for 2 weeks, then switch to over the counter zantac/ratinidine or pepcid/famotadine once at night for 2 weeks.    sometimes irritation causes more irritation. Try voice rest, use sugar free cough drops to prevent coughing, and try to stop clearing your throat.   If you ever have a cough that does not go away after trying these things please make a follow up visit for further evaluation or we can refer you to a specialist. Or if you ever have shortness of breath or chest pain go to the ER.

## 2017-12-19 ENCOUNTER — Encounter: Payer: Self-pay | Admitting: Internal Medicine

## 2018-01-09 NOTE — Progress Notes (Signed)
[facility]   [name], M.D.     Uvaldo Bristle. Silverio Lay, P.A.-C Liane Comber, [place]                [address] [place], [place] [address] Telephone [phone] Telefax [phone] Annual  Screening/Preventative Visit  & Comprehensive Evaluation & Examination     This very nice 72 y.o. MWM presents for a Screening/Preventative Visit & comprehensive evaluation and management of multiple medical co-morbidities.  Patient has been followed for HTN, HLD, Prediabetes, Testosterone Deficiency and Vitamin D Deficiency. Patient's GERD is controlled on his meds.      HTN predates circa 1994. Patient's BP has been controlled at home.  Today's BP is at goal - 132/76. In 2011, a heart cath was negative. Patient denies any cardiac symptoms as chest pain, palpitations, shortness of breath, dizziness or ankle swelling.     Patient's hyperlipidemia is not controlled with diet and hx/o Statin Intolerance. Patient denies myalgias or other medication SE's. Last lipids were not at goal: Lab Results  Component Value Date   CHOL 210 (H) 10/12/2017   HDL 53 10/12/2017   LDLCALC 125 (H) 10/12/2017   LDLDIRECT 151.0 02/10/2010   TRIG 207 (H) 10/12/2017   CHOLHDL 4.0 10/12/2017      Patient is followed expectantly for prediabetes since    and patient denies reactive hypoglycemic symptoms, visual blurring, diabetic polys or paresthesias. Last A1c was Normal & at goal: Lab Results  Component Value Date   HGBA1C 5.0 07/05/2017       Patient has been on replacement for Low Testosterone since 2013. Finally, patient has history of Vitamin D Deficiency  ("38"/2016)  and last vitamin D was at goal: Lab Results  Component Value Date   VD25OH 57 07/05/2017   Current Outpatient Medications on File Prior to Visit  Medication Sig  . aspirin (ASPIR-LOW) 81 MG EC tablet Take 81 mg by mouth daily.    Marland Kitchen buPROPion  (WELLBUTRIN XL) 300 MG 24 hr tablet TAKE 1 TABLET DAILY FOR MOOD  . cetirizine (ZYRTEC) 10 MG tablet Take 1 tablet (10 mg total) by mouth at bedtime.  Marland Kitchen desoximetasone (TOPICORT) 0.25 % cream Apply 1 application topically 2 (two) times daily.  . diclofenac sodium (VOLTAREN) 1 % GEL Apply 2 g topically 4 (four) times daily.  . fish oil-omega-3 fatty acids 1000 MG capsule Take 1 capsule by mouth 2 (two) times daily.    . hydrochlorothiazide (HYDRODIURIL) 25 MG tablet Take 1 tablet daily for BP and fluid  . losartan (COZAAR) 100 MG tablet Take 1 tablet (100 mg total) by mouth daily.  . Magnesium 250 MG TABS Take 1 tablet by mouth 2 (two) times daily.    . Multiple Vitamins-Minerals (STRESS B COMPLEX/ZINC) TABS Take 1 tablet by mouth daily.    . pantoprazole (PROTONIX) 40 MG tablet Take 40 mg by mouth daily.  . valACYclovir (VALTREX) 1000 MG tablet   . Testosterone (ANDROGEL PUMP) 20.25 MG/ACT (1.62%) GEL Apply 4 pumps daily  - disp 6 bottles (90 day supply) with refills for 6 months   No current facility-administered medications on file prior to visit.    Allergies  Allergen Reactions  . Atenolol   . Atorvastatin   . Darvocet [Propoxyphene N-Acetaminophen]   . Paroxetine   . Pregabalin   . Propoxyphene Hcl  Past Medical History:  Diagnosis Date  . BPH (benign prostatic hypertrophy)   . 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 Erythrocytosis 04/28/2014  . Exertional dyspnea    ETT myoview 5/11: 8 min, no ischemic ECG changes, stopped due to fatigue/ SOB. EF 60%, no ischemia or infarction. Echo 5/11 EF 60%, no significant valvular abnormalities, mild RV dilation and dysfunction, no complete TR doppler jet so PA pressure hard to gauge. 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 R heart cath 6/11: mean RA 10 mmHg, PA 25/18, mean PCWP 13 mmHg, CI 2.1  . Fibromyalgia   . GERD (gastroesophageal reflux disease)   . History of epididymitis   . History  of head injury without skull fracture 1980   Concussion  . Hx of adenomatous polyp of colon 06/19/2007  . Hyperlipidemia    Myalgias with Lipitor  . Hypertension   . Hypogonadism male   . Myofascial pain syndrome   . Nephrolithiasis   . Prolapsed internal hemorrhoids   . Prostatitis    Recurrent  . Rheumatic fever    x3 at ages [age] and 66; No significant valvular abnormality on 5/11 echo  . Tendinitis 2000   Hx of left bicep  . Vitamin D deficiency    Health Maintenance  Topic Date Due  . Hepatitis C Screening  07-27-1946  . INFLUENZA VACCINE  04/25/2018  . TETANUS/TDAP  04/26/2019  . COLONOSCOPY  06/08/2020  . PNA vac Low Risk Adult  Completed   Immunization History  Administered Date(s) Administered  . Influenza, High Dose Seasonal PF 08/13/2014, 08/31/2015  . Pneumococcal Conjugate-13 08/13/2014  . Pneumococcal Polysaccharide-23 07/12/2012  . Tdap 04/25/2009  . Zoster 07/27/2007   Last Colon - 06/09/2015 - Dr Carlean Purl recc f/u 5 yr Sept 2021.  Past Surgical History:  Procedure Laterality Date  . BIH REPAIR  08/2005   Incarverated fat by mesh repair  . CARDIAC CATHETERIZATION  02/2010   Left and right  . FINGER SURGERY  2000   Repair of injury to right fifth finger  . HEEL SPUR SURGERY  1960   Bilateral  . TONSILLECTOMY  1962   Family History  Problem Relation Age of Onset  . Hypertension Mother   . Breast cancer Mother   . Aortic stenosis Father   . Stroke Neg Hx   . Colon cancer Neg Hx   . Colon polyps Neg Hx   . Hypertension Other   . Aortic stenosis Other   . Breast cancer Other   . Diabetes Other   . Other Other        ASHD and epilepsy   Social History   Socioeconomic History  . Marital status: Married    Spouse name: Not on file  . Number of children: Not on file  Occupational History  . 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 Smoking status: Never Smoker  .  Smokeless tobacco: Never Used  Substance and Sexual Activity  . Alcohol use: Yes    Alcohol/week: 7.2 oz    Types: 12 Cans of beer per week    Comment: beer and liquoer  . Sexual activity: Not on file  Relationships  Social History Narrative   Married, lives in [place]    ROS Constitutional: Denies fever, chills, weight loss/gain, headaches, insomnia,  night sweats or change in appetite. Does c/o fatigue. Eyes: Denies redness, blurred  vision, diplopia, discharge, itchy or watery eyes.  ENT: Denies discharge, congestion, post nasal drip, epistaxis, sore throat, earache, hearing loss, dental pain, Tinnitus, Vertigo, Sinus pain or snoring.  Cardio: Denies chest pain, palpitations, irregular heartbeat, syncope, dyspnea, diaphoresis, orthopnea, PND, claudication or edema Respiratory: denies cough, dyspnea, DOE, pleurisy, hoarseness, laryngitis or wheezing.  Gastrointestinal: Denies dysphagia, heartburn, reflux, water brash, pain, cramps, nausea, vomiting, bloating, diarrhea, constipation, hematemesis, melena, hematochezia, jaundice or hemorrhoids Genitourinary: Denies dysuria, frequency, urgency, nocturia, hesitancy, discharge, hematuria or flank pain Musculoskeletal: Denies arthralgia, myalgia, stiffness, Jt. Swelling, pain, limp or strain/sprain. Denies Falls. Skin: Denies puritis, rash, hives, warts, acne, eczema or change in skin lesion Neuro: No weakness, tremor, incoordination, spasms, paresthesia or pain Psychiatric: Denies confusion, memory loss or sensory loss. Denies Depression. Endocrine: Denies change in weight, skin, hair change, nocturia, and paresthesia, diabetic polys, visual blurring or hyper / hypo glycemic episodes.  Heme/Lymph: No excessive bleeding, bruising or enlarged lymph nodes.  Physical Exam  BP 132/76   Pulse 76   Temp (!) 97.5 F (36.4 C)   Resp 18   Ht 5\' 10"  (1.778 m)   Wt 211 lb 12.8 oz (96.1 kg)   BMI 30.39 kg/m   General Appearance: Well nourished and  well groomed and in no apparent distress.  Eyes: PERRLA, EOMs, conjunctiva no swelling or erythema, normal fundi and vessels. Sinuses: No frontal/maxillary tenderness ENT/Mouth: EACs patent / TMs  nl. Nares clear without erythema, swelling, mucoid exudates. Oral hygiene is good. No erythema, swelling, or exudate. Tongue normal, non-obstructing. Tonsils not swollen or erythematous. Hearing normal.  Neck: Supple, thyroid not palpable. No bruits, nodes or JVD. Respiratory: Respiratory effort normal.  BS equal and clear bilateral without rales, rhonci, wheezing or stridor. Cardio: Heart sounds are normal with regular rate and rhythm and no murmurs, rubs or gallops. Peripheral pulses are normal and equal bilaterally without edema. No aortic or femoral bruits. Chest: symmetric with normal excursions and percussion.  Abdomen: Soft, with Nl bowel sounds. Nontender, no guarding, rebound, hernias, masses, or organomegaly.  Lymphatics: Non tender without lymphadenopathy.  Genitourinary: No hernias.Testes nl. DRE - prostate nl for age - smooth & firm w/o nodules. Musculoskeletal: Full ROM all peripheral extremities, joint stability, 5/5 strength, and normal gait. Skin: Warm and dry without rashes, lesions, cyanosis, clubbing or  ecchymosis.  Neuro: Cranial nerves intact, reflexes equal bilaterally. Normal muscle tone, no cerebellar symptoms. Sensation intact.  Pysch: Alert and oriented X 3 with normal affect, insight and judgment appropriate.   Assessment and Plan  1. Annual Preventative/Screening Exam   2. Essential hypertension  - EKG 12-Lead - [place], RETROPERITNL ABD,  LTD - Urinalysis, Routine w reflex microscopic - Microalbumin / creatinine urine ratio - COMPLETE METABOLIC PANEL WITH GFR - Magnesium - TSH  3. Hyperlipidemia, mixed  - EKG 12-Lead - [place], RETROPERITNL ABD,  LTD - COMPLETE METABOLIC PANEL WITH GFR - Lipid panel - TSH  4. Abnormal glucose  - EKG 12-Lead - [place], RETROPERITNL  ABD,  LTD - Hemoglobin A1c - Insulin, random  5. Vitamin D deficiency  - VITAMIN D 25 Hydroxyl  6. Gastroesophageal reflux disease  - ranitidine (ZANTAC) 300 MG tablet; Take 1 to 2 tablets / daily for Indigestion & Heartburn  Dispense: 180 tablet; Refill: 1  - CBC with Differential/Platelet  7. Testosterone Deficiency  - Testosterone  8. Screening for colorectal cancer  - POC Hemoccult Bld/Stl  9. BPH with urinary obstruction  - PSA  10. Prostate cancer screening  -  PSA  11. Screening for ischemic heart disease  - EKG 12-Lead  12. FHx: heart disease  - EKG 12-Lead - [place], RETROPERITNL ABD,  LTD  13. Screening for AAA (aortic abdominal aneurysm)  - [place], RETROPERITNL ABD,  LTD  14. Medication management  - Urinalysis, Routine w reflex microscopic - Microalbumin / creatinine urine ratio - Testosterone - CBC with Differential/Platelet - COMPLETE METABOLIC PANEL WITH GFR - Magnesium - Lipid panel - TSH - Hemoglobin A1c - Insulin, random - VITAMIN D 25 Hydroxyl             Patient was counseled in prudent diet, weight control to achieve/maintain BMI less than 25, BP monitoring, regular exercise and medications as discussed.  Discussed med effects and SE's. Routine screening labs and tests as requested with regular follow-up as recommended. Over 40 minutes of exam, counseling, chart review and high complex critical decision making was performed

## 2018-01-10 ENCOUNTER — Other Ambulatory Visit: Payer: Self-pay | Admitting: *Deleted

## 2018-01-10 ENCOUNTER — Ambulatory Visit (INDEPENDENT_AMBULATORY_CARE_PROVIDER_SITE_OTHER): Payer: Medicare Other | Admitting: Internal Medicine

## 2018-01-10 VITALS — BP 132/76 | HR 76 | Temp 97.5°F | Resp 18 | Ht 70.0 in | Wt 211.8 lb

## 2018-01-10 DIAGNOSIS — E782 Mixed hyperlipidemia: Secondary | ICD-10-CM

## 2018-01-10 DIAGNOSIS — N401 Enlarged prostate with lower urinary tract symptoms: Secondary | ICD-10-CM

## 2018-01-10 DIAGNOSIS — Z0000 Encounter for general adult medical examination without abnormal findings: Secondary | ICD-10-CM | POA: Diagnosis not present

## 2018-01-10 DIAGNOSIS — Z8249 Family history of ischemic heart disease and other diseases of the circulatory system: Secondary | ICD-10-CM

## 2018-01-10 DIAGNOSIS — Z136 Encounter for screening for cardiovascular disorders: Secondary | ICD-10-CM | POA: Diagnosis not present

## 2018-01-10 DIAGNOSIS — E559 Vitamin D deficiency, unspecified: Secondary | ICD-10-CM

## 2018-01-10 DIAGNOSIS — Z125 Encounter for screening for malignant neoplasm of prostate: Secondary | ICD-10-CM

## 2018-01-10 DIAGNOSIS — K219 Gastro-esophageal reflux disease without esophagitis: Secondary | ICD-10-CM

## 2018-01-10 DIAGNOSIS — Z0001 Encounter for general adult medical examination with abnormal findings: Secondary | ICD-10-CM

## 2018-01-10 DIAGNOSIS — N138 Other obstructive and reflux uropathy: Secondary | ICD-10-CM

## 2018-01-10 DIAGNOSIS — E291 Testicular hypofunction: Secondary | ICD-10-CM

## 2018-01-10 DIAGNOSIS — R7309 Other abnormal glucose: Secondary | ICD-10-CM

## 2018-01-10 DIAGNOSIS — Z79899 Other long term (current) drug therapy: Secondary | ICD-10-CM

## 2018-01-10 DIAGNOSIS — I10 Essential (primary) hypertension: Secondary | ICD-10-CM | POA: Diagnosis not present

## 2018-01-10 DIAGNOSIS — Z1212 Encounter for screening for malignant neoplasm of rectum: Secondary | ICD-10-CM

## 2018-01-10 DIAGNOSIS — Z1211 Encounter for screening for malignant neoplasm of colon: Secondary | ICD-10-CM

## 2018-01-10 MED ORDER — RANITIDINE HCL 300 MG PO TABS
ORAL_TABLET | ORAL | 1 refills | Status: DC
Start: 2018-01-10 — End: 2018-03-12

## 2018-01-10 MED ORDER — LOSARTAN POTASSIUM 100 MG PO TABS: 100.0000 mg | ORAL_TABLET | Freq: Every day | ORAL | 1 refills | Status: DC

## 2018-01-10 MED ORDER — METOCLOPRAMIDE HCL 10 MG PO TABS: ORAL_TABLET | ORAL | 1 refills | Status: DC

## 2018-01-10 MED ORDER — DESOXIMETASONE 0.25 % EX CREA: 1.0000 "application " | TOPICAL_CREAM | Freq: Two times a day (BID) | CUTANEOUS | 1 refills | Status: DC

## 2018-01-10 MED ORDER — RANITIDINE HCL 300 MG PO TABS: ORAL_TABLET | ORAL | 1 refills | Status: DC

## 2018-01-10 NOTE — Patient Instructions (Addendum)
We Do NOT Approve of  [facility] [place] Soliciting Our Patients  To Do Home Visits  & We Do NOT Approve of [employer] > > > > > > > > > > > > > > > > > > > > > > > > > > > > > > > > > > >  > > > >   Preventive Care for Adults  A healthy lifestyle and preventive care can promote health and wellness. Preventive health guidelines for men include the following key practices:  A routine yearly physical is a good way to check with your health care provider about your health and preventative screening. It is a chance to share any concerns and updates on your health and to receive a thorough exam.  Visit your dentist for a routine exam and preventative care every 6 months. Brush your teeth twice a day and floss once a day. Good oral hygiene prevents tooth decay and gum disease.  The frequency of eye exams is based on your age, health, family medical history, use of contact lenses, and other factors. Follow your health care provider's recommendations for frequency of eye exams.  Eat a healthy diet. Foods such as vegetables, fruits, whole grains, low-fat dairy products, and lean protein foods contain the nutrients you need without too many calories. Decrease your intake of foods high in solid fats, added sugars, and salt. Eat the right amount of calories for you. Get information about a proper diet from your health care provider, if necessary.  Regular physical exercise is one of the most important things you can do for your health. Most adults should get at least 150 minutes of moderate-intensity exercise (any activity that increases your heart rate and causes you to sweat) each week. In addition, most adults need muscle-strengthening exercises on 2 or more days a week.  Maintain a healthy weight. The body mass index (BMI) is a screening tool to identify possible weight problems. It provides an estimate of body fat based on height and weight. Your health care provider can find  your BMI and can help you achieve or maintain a healthy weight. For adults 20 years and older:  A BMI below 18.5 is considered underweight.  A BMI of 18.5 to 24.9 is normal.  A BMI of 25 to 29.9 is considered overweight.  A BMI of 30 and above is considered obese.  Maintain normal blood lipids and cholesterol levels by exercising and minimizing your intake of saturated fat. Eat a balanced diet with plenty of fruit and vegetables. Blood tests for lipids and cholesterol should begin at age 20 and be repeated every 5 years. If your lipid or cholesterol levels are high, you are over 50, or you are at high risk for heart disease, you may need your cholesterol levels checked more frequently. Ongoing high lipid and cholesterol levels should be treated with medicines if diet and exercise are not working.  If you smoke, find out from your health care provider how to quit. If you do not use tobacco, do not start.  Lung cancer screening is recommended for adults aged 55-80 years who are at high risk for developing lung cancer because of a history of smoking. A yearly low-dose CT scan of the lungs is recommended for people who have at least a 30-pack-year history of smoking and are a current smoker or have quit within the past 15 years. A pack year of smoking is smoking an average of 1 pack   of cigarettes a day for 1 year (for example: 1 pack a day for 30 years or 2 packs a day for 15 years). Yearly screening should continue until the smoker has stopped smoking for at least 15 years. Yearly screening should be stopped for people who develop a health problem that would prevent them from having lung cancer treatment.  If you choose to drink alcohol, do not have more than 2 drinks per day. One drink is considered to be 12 ounces (355 mL) of beer, 5 ounces (148 mL) of wine, or 1.5 ounces (44 mL) of liquor.  Avoid use of street drugs. Do not share needles with anyone. Ask for help if you need support or instructions  about stopping the use of drugs.  High blood pressure causes heart disease and increases the risk of stroke. Your blood pressure should be checked at least every 1-2 years. Ongoing high blood pressure should be treated with medicines, if weight loss and exercise are not effective.  If you are 45-79 years old, ask your health care provider if you should take aspirin to prevent heart disease.  Diabetes screening involves taking a blood sample to check your fasting blood sugar level. Testing should be considered at a younger age or be carried out more frequently if you are overweight and have at least 1 risk factor for diabetes.  Colorectal cancer can be detected and often prevented. Most routine colorectal cancer screening begins at the age of 50 and continues through age 75. However, your health care provider may recommend screening at an earlier age if you have risk factors for colon cancer. On a yearly basis, your health care provider may provide home test kits to check for hidden blood in the stool. Use of a small camera at the end of a tube to directly examine the colon (sigmoidoscopy or colonoscopy) can detect the earliest forms of colorectal cancer. Talk to your health care provider about this at age 50, when routine screening begins. Direct exam of the colon should be repeated every 5-10 years through age 75, unless early forms of precancerous polyps or small growths are found.  Hepatitis C blood testing is recommended for all people born from 1945 through 1965 and any individual with known risks for hepatitis C.  Screening for abdominal aortic aneurysm (AAA)  by ultrasound is recommended for people who have history of high blood pressure or who are current or former smokers.  Healthy men should  receive prostate-specific antigen (PSA) blood tests as part of routine cancer screening. Talk with your health care provider about prostate cancer screening.  Testicular cancer screening is   recommended for adult males. Screening includes self-exam, a health care provider exam, and other screening tests. Consult with your health care provider about any symptoms you have or any concerns you have about testicular cancer.  Use sunscreen. Apply sunscreen liberally and repeatedly throughout the day. You should seek shade when your shadow is shorter than you. Protect yourself by wearing long sleeves, pants, a wide-brimmed hat, and sunglasses year round, whenever you are outdoors.  Once a month, do a whole-body skin exam, using a mirror to look at the skin on your back. Tell your health care provider about new moles, moles that have irregular borders, moles that are larger than a pencil eraser, or moles that have changed in shape or color.  Stay current with required vaccines (immunizations).  Influenza vaccine. All adults should be immunized every year.  Tetanus, diphtheria, and acellular pertussis (  Td, Tdap) vaccine. An adult who has not previously received Tdap or who does not know his vaccine status should receive 1 dose of Tdap. This initial dose should be followed by tetanus and diphtheria toxoids (Td) booster doses every 10 years. Adults with an unknown or incomplete history of completing a 3-dose immunization series with Td-containing vaccines should begin or complete a primary immunization series including a Tdap dose. Adults should receive a Td booster every 10 years.  Zoster vaccine. One dose is recommended for adults aged 60 years or older unless certain conditions are present.    PREVNAR - Pneumococcal 13-valent conjugate (PCV13) vaccine. When indicated, a person who is uncertain of his immunization history and has no record of immunization should receive the PCV13 vaccine. An adult aged 19 years or older who has certain medical conditions and has not been previously immunized should receive 1 dose of PCV13 vaccine. This PCV13 should be followed with a dose of pneumococcal  polysaccharide (PPSV23) vaccine. The PPSV23 vaccine dose should be obtained 1 or more year(s)after the dose of PCV13 vaccine. An adult aged 19 years or older who has certain medical conditions and previously received 1 or more doses of PPSV23 vaccine should receive 1 dose of PCV13. The PCV13 vaccine dose should be obtained 1 or more years after the last PPSV23 vaccine dose.    PNEUMOVAX - Pneumococcal polysaccharide (PPSV23) vaccine. When PCV13 is also indicated, PCV13 should be obtained first. All adults aged 65 years and older should be immunized. An adult younger than age 65 years who has certain medical conditions should be immunized. Any person who resides in a nursing home or long-term care facility should be immunized. An adult smoker should be immunized. People with an immunocompromised condition and certain other conditions should receive both PCV13 and PPSV23 vaccines. People with human immunodeficiency virus (HIV) infection should be immunized as soon as possible after diagnosis. Immunization during chemotherapy or radiation therapy should be avoided. Routine use of PPSV23 vaccine is not recommended for American Indians, [place] Natives, or people younger than 65 years unless there are medical conditions that require PPSV23 vaccine. When indicated, people who have unknown immunization and have no record of immunization should receive PPSV23 vaccine. One-time revaccination 5 years after the first dose of PPSV23 is recommended for people aged 19-64 years who have chronic kidney failure, nephrotic syndrome, asplenia, or immunocompromised conditions. People who received 1-2 doses of PPSV23 before age 65 years should receive another dose of PPSV23 vaccine at age 65 years or later if at least 5 years have passed since the previous dose. Doses of PPSV23 are not needed for people immunized with PPSV23 at or after age 65 years.    Hepatitis A vaccine. Adults who wish to be protected from this disease, have  certain high-risk conditions, work with hepatitis A-infected animals, work in hepatitis A research labs, or travel to or work in countries with a high rate of hepatitis A should be immunized. Adults who were previously unvaccinated and who anticipate close contact with an international adoptee during the first 60 days after arrival in the [place] from a country with a high rate of hepatitis A should be immunized.    Hepatitis B vaccine. Adults should be immunized if they wish to be protected from this disease, have certain high-risk conditions, may be exposed to blood or other infectious body fluids, are household contacts or sex partners of hepatitis B positive people, are clients or workers in certain care facilities, or   travel to or work in countries with a high rate of hepatitis B.   Preventive Service / Frequency   Ages 65 and over  Blood pressure check.  Lipid and cholesterol check.  Lung cancer screening. / Every year if you are aged 55-80 years and have a 30-pack-year history of smoking and currently smoke or have quit within the past 15 years. Yearly screening is stopped once you have quit smoking for at least 15 years or develop a health problem that would prevent you from having lung cancer treatment.  Fecal occult blood test (FOBT) of stool. You may not have to do this test if you get a colonoscopy every 10 years.  Flexible sigmoidoscopy** or colonoscopy.** / Every 5 years for a flexible sigmoidoscopy or every 10 years for a colonoscopy beginning at age 50 and continuing until age 75.  Hepatitis C blood test.** / For all people born from 1945 through 1965 and any individual with known risks for hepatitis C.  Abdominal aortic aneurysm (AAA) screening./ Screening current or former smokers or have Hypertension.  Skin self-exam. / Monthly.  Influenza vaccine. / Every year.  Tetanus, diphtheria, and acellular pertussis (Tdap/Td) vaccine.** / 1 dose of Td every 10  years.   Zoster vaccine.** / 1 dose for adults aged 60 years or older.         Pneumococcal 13-valent conjugate (PCV13) vaccine.    Pneumococcal polysaccharide (PPSV23) vaccine.     Hepatitis A vaccine.** / Consult your health care provider.  Hepatitis B vaccine.** / Consult your health care provider. Screening for abdominal aortic aneurysm (AAA)  by ultrasound is recommended for people who have history of high blood pressure or who are current or former smokers. ++++++++++ Recommend Adult Low Dose Aspirin or  coated  Aspirin 81 mg daily  To reduce risk of Colon Cancer 20 %,  Skin Cancer 26 % ,  Malignant Melanoma 46%  and  Pancreatic cancer 60% ++++++++++++++++++++++ Vitamin D goal  is between 70-100.  Please make sure that you are taking your Vitamin D as directed.  It is very important as a natural anti-inflammatory  helping hair, skin, and nails, as well as reducing stroke and heart attack risk.  It helps your bones and helps with mood. It also decreases numerous cancer risks so please take it as directed.  Low Vit D is associated with a 200-300% higher risk for CANCER  and 200-300% higher risk for HEART   ATTACK  &  STROKE.   ...................................... It is also associated with higher death rate at younger ages,  autoimmune diseases like Rheumatoid arthritis, Lupus, Multiple Sclerosis.    Also many other serious conditions, like depression, Alzheimer's Dementia, infertility, muscle aches, fatigue, fibromyalgia - just to name a few. ++++++++++++++++++++++ Recommend the book "The END of DIETING" by Dr Joel Fuhrman  & the book "The END of DIABETES " by Dr Joel Fuhrman At [employer] - get book & Audio CD's    Being diabetic has a  300% increased risk for heart attack, stroke, cancer, and alzheimer- type vascular dementia. It is very important that you work harder with diet by avoiding all foods that are white. Avoid white rice (brown & wild rice is OK), white  potatoes (sweetpotatoes in moderation is OK), White bread or wheat bread or anything made out of white flour like bagels, donuts, rolls, buns, biscuits, cakes, pastries, cookies, pizza crust, and pasta (made from white flour & egg whites) - vegetarian pasta or spinach or wheat   pasta is OK. Multigrain breads like Arnold's or [employer], or multigrain sandwich thins or flatbreads.  Diet, exercise and weight loss can reverse and cure diabetes in the early stages.  Diet, exercise and weight loss is very important in the control and prevention of complications of diabetes which affects every system in your body, ie. Brain - dementia/stroke, eyes - glaucoma/blindness, heart - heart attack/heart failure, kidneys - dialysis, stomach - gastric paralysis, intestines - malabsorption, nerves - severe painful neuritis, circulation - gangrene & loss of a leg(s), and finally cancer and Alzheimers.    I recommend avoid fried & greasy foods,  sweets/candy, white rice (brown or wild rice or Quinoa is OK), white potatoes (sweet potatoes are OK) - anything made from white flour - bagels, doughnuts, rolls, buns, biscuits,white and wheat breads, pizza crust and traditional pasta made of white flour & egg white(vegetarian pasta or spinach or wheat pasta is OK).  Multi-grain bread is OK - like multi-grain flat bread or sandwich thins. Avoid alcohol in excess. Exercise is also important.    Eat all the vegetables you want - avoid meat, especially red meat and dairy - especially cheese.  Cheese is the most concentrated form of trans-fats which is the worst thing to clog up our arteries. Veggie cheese is OK which can be found in the fresh produce section at [employer] or [employer] or [employer]  ++++++++++++++++++++++ DASH Eating Plan  DASH stands for "Dietary Approaches to Stop Hypertension."   The DASH eating plan is a healthy eating plan that has been shown to reduce high blood pressure (hypertension). Additional health  benefits may include reducing the risk of type 2 diabetes mellitus, heart disease, and stroke. The DASH eating plan may also help with weight loss. WHAT DO I NEED TO KNOW ABOUT THE DASH EATING PLAN? For the DASH eating plan, you will follow these general guidelines:  Choose foods with a percent daily value for sodium of less than 5% (as listed on the food label).  Use salt-free seasonings or herbs instead of table salt or sea salt.  Check with your health care provider or pharmacist before using salt substitutes.  Eat lower-sodium products, often labeled as "lower sodium" or "no salt added."  Eat fresh foods.  Eat more vegetables, fruits, and low-fat dairy products.  Choose whole grains. Look for the word "whole" as the first word in the ingredient list.  Choose fish   Limit sweets, desserts, sugars, and sugary drinks.  Choose heart-healthy fats.  Eat veggie cheese   Eat more home-cooked food and less restaurant, buffet, and fast food.  Limit fried foods.  Cook foods using methods other than frying.  Limit canned vegetables. If you do use them, rinse them well to decrease the sodium.  When eating at a restaurant, ask that your food be prepared with less salt, or no salt if possible.                      WHAT FOODS CAN I EAT? Read Dr Joel Fuhrman's books on The End of Dieting & The End of Diabetes  Grains Whole grain or whole wheat bread. Brown rice. Whole grain or whole wheat pasta. Quinoa, bulgur, and whole grain cereals. Low-sodium cereals. Corn or whole wheat flour tortillas. Whole grain cornbread. Whole grain crackers. Low-sodium crackers.  Vegetables Fresh or frozen vegetables (raw, steamed, roasted, or grilled). Low-sodium or reduced-sodium tomato and vegetable juices. Low-sodium or reduced-sodium tomato sauce and paste. Low-sodium or   reduced-sodium canned vegetables.   Fruits All fresh, canned (in natural juice), or frozen fruits.  Protein Products  All fish  and seafood.  Dried beans, peas, or lentils. Unsalted nuts and seeds. Unsalted canned beans.  Dairy Low-fat dairy products, such as skim or 1% milk, 2% or reduced-fat cheeses, low-fat ricotta or cottage cheese, or plain low-fat yogurt. Low-sodium or reduced-sodium cheeses.  Fats and Oils Tub margarines without trans fats. Light or reduced-fat mayonnaise and salad dressings (reduced sodium). Avocado. Safflower, olive, or canola oils. Natural peanut or almond butter.  Other Unsalted popcorn and pretzels. The items listed above may not be a complete list of recommended foods or beverages. Contact your dietitian for more options.  ++++++++++++++++++++  WHAT FOODS ARE NOT RECOMMENDED? Grains/ White flour or wheat flour White bread. White pasta. White rice. Refined cornbread. Bagels and croissants. Crackers that contain trans fat.  Vegetables  Creamed or fried vegetables. Vegetables in a . Regular canned vegetables. Regular canned tomato sauce and paste. Regular tomato and vegetable juices.  Fruits Dried fruits. Canned fruit in light or heavy syrup. Fruit juice.  Meat and Other Protein Products Meat in general - RED meat & White meat.  Fatty cuts of meat. Ribs, chicken wings, all processed meats as bacon, sausage, bologna, salami, fatback, hot dogs, bratwurst and packaged luncheon meats.  Dairy Whole or 2% milk, cream, half-and-half, and cream cheese. Whole-fat or sweetened yogurt. Full-fat cheeses or blue cheese. Non-dairy creamers and whipped toppings. Processed cheese, cheese spreads, or cheese curds.  Condiments Onion and garlic salt, seasoned salt, table salt, and sea salt. Canned and packaged gravies. Worcestershire sauce. Tartar sauce. Barbecue sauce. Teriyaki sauce. Soy sauce, including reduced sodium. Steak sauce. Fish sauce. Oyster sauce. Cocktail sauce. Horseradish. Ketchup and mustard. Meat flavorings and tenderizers. Bouillon cubes. Hot sauce. Tabasco sauce. Marinades. Taco  seasonings. Relishes.  Fats and Oils Butter, stick margarine, lard, shortening and bacon fat. Coconut, palm kernel, or palm oils. Regular salad dressings.  Pickles and olives. Salted popcorn and pretzels.  The items listed above may not be a complete list of foods and beverages to avoid.    We Do NOT Approve of  [facility] [place] Soliciting Our Patients  To Do Home Visits  & We Do NOT Approve of [employer] > > > > > > > > > > > > > > > > > > > > > > > > > > > > > > > > > > >  > > > >   Preventive Care for Adults  A healthy lifestyle and preventive care can promote health and wellness. Preventive health guidelines for men include the following key practices:  A routine yearly physical is a good way to check with your health care provider about your health and preventative screening. It is a chance to share any concerns and updates on your health and to receive a thorough exam.  Visit your dentist for a routine exam and preventative care every 6 months. Brush your teeth twice a day and floss once a day. Good oral hygiene prevents tooth decay and gum disease.  The frequency of eye exams is based on your age, health, family medical history, use of contact lenses, and other factors. Follow your health care provider's recommendations for frequency of eye exams.  Eat a healthy diet. Foods such as vegetables, fruits, whole grains, low-fat dairy products, and lean protein foods contain the nutrients you need without too many calories. Decrease your intake of   foods high in solid fats, added sugars, and salt. Eat the right amount of calories for you. Get information about a proper diet from your health care provider, if necessary.  Regular physical exercise is one of the most important things you can do for your health. Most adults should get at least 150 minutes of moderate-intensity exercise (any activity that increases your heart rate and causes you to sweat) each week.  In addition, most adults need muscle-strengthening exercises on 2 or more days a week.  Maintain a healthy weight. The body mass index (BMI) is a screening tool to identify possible weight problems. It provides an estimate of body fat based on height and weight. Your health care provider can find your BMI and can help you achieve or maintain a healthy weight. For adults 20 years and older:  A BMI below 18.5 is considered underweight.  A BMI of 18.5 to 24.9 is normal.  A BMI of 25 to 29.9 is considered overweight.  A BMI of 30 and above is considered obese.  Maintain normal blood lipids and cholesterol levels by exercising and minimizing your intake of saturated fat. Eat a balanced diet with plenty of fruit and vegetables. Blood tests for lipids and cholesterol should begin at age 20 and be repeated every 5 years. If your lipid or cholesterol levels are high, you are over 50, or you are at high risk for heart disease, you may need your cholesterol levels checked more frequently. Ongoing high lipid and cholesterol levels should be treated with medicines if diet and exercise are not working.  If you smoke, find out from your health care provider how to quit. If you do not use tobacco, do not start.  Lung cancer screening is recommended for adults aged 55-80 years who are at high risk for developing lung cancer because of a history of smoking. A yearly low-dose CT scan of the lungs is recommended for people who have at least a 30-pack-year history of smoking and are a current smoker or have quit within the past 15 years. A pack year of smoking is smoking an average of 1 pack of cigarettes a day for 1 year (for example: 1 pack a day for 30 years or 2 packs a day for 15 years). Yearly screening should continue until the smoker has stopped smoking for at least 15 years. Yearly screening should be stopped for people who develop a health problem that would prevent them from having lung cancer treatment.  If  you choose to drink alcohol, do not have more than 2 drinks per day. One drink is considered to be 12 ounces (355 mL) of beer, 5 ounces (148 mL) of wine, or 1.5 ounces (44 mL) of liquor.  Avoid use of street drugs. Do not share needles with anyone. Ask for help if you need support or instructions about stopping the use of drugs.  High blood pressure causes heart disease and increases the risk of stroke. Your blood pressure should be checked at least every 1-2 years. Ongoing high blood pressure should be treated with medicines, if weight loss and exercise are not effective.  If you are 45-79 years old, ask your health care provider if you should take aspirin to prevent heart disease.  Diabetes screening involves taking a blood sample to check your fasting blood sugar level. Testing should be considered at a younger age or be carried out more frequently if you are overweight and have at least 1 risk factor for diabetes.    Colorectal cancer can be detected and often prevented. Most routine colorectal cancer screening begins at the age of 50 and continues through age 75. However, your health care provider may recommend screening at an earlier age if you have risk factors for colon cancer. On a yearly basis, your health care provider may provide home test kits to check for hidden blood in the stool. Use of a small camera at the end of a tube to directly examine the colon (sigmoidoscopy or colonoscopy) can detect the earliest forms of colorectal cancer. Talk to your health care provider about this at age 50, when routine screening begins. Direct exam of the colon should be repeated every 5-10 years through age 75, unless early forms of precancerous polyps or small growths are found.  Hepatitis C blood testing is recommended for all people born from 1945 through 1965 and any individual with known risks for hepatitis C.  Screening for abdominal aortic aneurysm (AAA)  by ultrasound is recommended for people who  have history of high blood pressure or who are current or former smokers.  Healthy men should  receive prostate-specific antigen (PSA) blood tests as part of routine cancer screening. Talk with your health care provider about prostate cancer screening.  Testicular cancer screening is  recommended for adult males. Screening includes self-exam, a health care provider exam, and other screening tests. Consult with your health care provider about any symptoms you have or any concerns you have about testicular cancer.  Use sunscreen. Apply sunscreen liberally and repeatedly throughout the day. You should seek shade when your shadow is shorter than you. Protect yourself by wearing long sleeves, pants, a wide-brimmed hat, and sunglasses year round, whenever you are outdoors.  Once a month, do a whole-body skin exam, using a mirror to look at the skin on your back. Tell your health care provider about new moles, moles that have irregular borders, moles that are larger than a pencil eraser, or moles that have changed in shape or color.  Stay current with required vaccines (immunizations).  Influenza vaccine. All adults should be immunized every year.  Tetanus, diphtheria, and acellular pertussis (Td, Tdap) vaccine. An adult who has not previously received Tdap or who does not know his vaccine status should receive 1 dose of Tdap. This initial dose should be followed by tetanus and diphtheria toxoids (Td) booster doses every 10 years. Adults with an unknown or incomplete history of completing a 3-dose immunization series with Td-containing vaccines should begin or complete a primary immunization series including a Tdap dose. Adults should receive a Td booster every 10 years.  Zoster vaccine. One dose is recommended for adults aged 60 years or older unless certain conditions are present.    PREVNAR - Pneumococcal 13-valent conjugate (PCV13) vaccine. When indicated, a person who is uncertain of his  immunization history and has no record of immunization should receive the PCV13 vaccine. An adult aged 19 years or older who has certain medical conditions and has not been previously immunized should receive 1 dose of PCV13 vaccine. This PCV13 should be followed with a dose of pneumococcal polysaccharide (PPSV23) vaccine. The PPSV23 vaccine dose should be obtained 1 or more year(s)after the dose of PCV13 vaccine. An adult aged 19 years or older who has certain medical conditions and previously received 1 or more doses of PPSV23 vaccine should receive 1 dose of PCV13. The PCV13 vaccine dose should be obtained 1 or more years after the last PPSV23 vaccine dose.    PNEUMOVAX -   Pneumococcal polysaccharide (PPSV23) vaccine. When PCV13 is also indicated, PCV13 should be obtained first. All adults aged 65 years and older should be immunized. An adult younger than age 65 years who has certain medical conditions should be immunized. Any person who resides in a nursing home or long-term care facility should be immunized. An adult smoker should be immunized. People with an immunocompromised condition and certain other conditions should receive both PCV13 and PPSV23 vaccines. People with human immunodeficiency virus (HIV) infection should be immunized as soon as possible after diagnosis. Immunization during chemotherapy or radiation therapy should be avoided. Routine use of PPSV23 vaccine is not recommended for American Indians, [place] Natives, or people younger than 65 years unless there are medical conditions that require PPSV23 vaccine. When indicated, people who have unknown immunization and have no record of immunization should receive PPSV23 vaccine. One-time revaccination 5 years after the first dose of PPSV23 is recommended for people aged 19-64 years who have chronic kidney failure, nephrotic syndrome, asplenia, or immunocompromised conditions. People who received 1-2 doses of PPSV23 before age 65 years should  receive another dose of PPSV23 vaccine at age 65 years or later if at least 5 years have passed since the previous dose. Doses of PPSV23 are not needed for people immunized with PPSV23 at or after age 65 years.    Hepatitis A vaccine. Adults who wish to be protected from this disease, have certain high-risk conditions, work with hepatitis A-infected animals, work in hepatitis A research labs, or travel to or work in countries with a high rate of hepatitis A should be immunized. Adults who were previously unvaccinated and who anticipate close contact with an international adoptee during the first 60 days after arrival in the [place] from a country with a high rate of hepatitis A should be immunized.    Hepatitis B vaccine. Adults should be immunized if they wish to be protected from this disease, have certain high-risk conditions, may be exposed to blood or other infectious body fluids, are household contacts or sex partners of hepatitis B positive people, are clients or workers in certain care facilities, or travel to or work in countries with a high rate of hepatitis B.   Preventive Service / Frequency   Ages 65 and over  Blood pressure check.  Lipid and cholesterol check.  Lung cancer screening. / Every year if you are aged 55-80 years and have a 30-pack-year history of smoking and currently smoke or have quit within the past 15 years. Yearly screening is stopped once you have quit smoking for at least 15 years or develop a health problem that would prevent you from having lung cancer treatment.  Fecal occult blood test (FOBT) of stool. You may not have to do this test if you get a colonoscopy every 10 years.  Flexible sigmoidoscopy** or colonoscopy.** / Every 5 years for a flexible sigmoidoscopy or every 10 years for a colonoscopy beginning at age 50 and continuing until age 75.  Hepatitis C blood test.** / For all people born from 1945 through 1965 and any individual with known  risks for hepatitis C.  Abdominal aortic aneurysm (AAA) screening./ Screening current or former smokers or have Hypertension.  Skin self-exam. / Monthly.  Influenza vaccine. / Every year.  Tetanus, diphtheria, and acellular pertussis (Tdap/Td) vaccine.** / 1 dose of Td every 10 years.   Zoster vaccine.** / 1 dose for adults aged 60 years or older.         Pneumococcal 13-valent   conjugate (PCV13) vaccine.    Pneumococcal polysaccharide (PPSV23) vaccine.     Hepatitis A vaccine.** / Consult your health care provider.  Hepatitis B vaccine.** / Consult your health care provider. Screening for abdominal aortic aneurysm (AAA)  by ultrasound is recommended for people who have history of high blood pressure or who are current or former smokers. ++++++++++ Recommend Adult Low Dose Aspirin or  coated  Aspirin 81 mg daily  To reduce risk of Colon Cancer 20 %,  Skin Cancer 26 % ,  Malignant Melanoma 46%  and  Pancreatic cancer 60% ++++++++++++++++++++++ Vitamin D goal  is between 70-100.  Please make sure that you are taking your Vitamin D as directed.  It is very important as a natural anti-inflammatory  helping hair, skin, and nails, as well as reducing stroke and heart attack risk.  It helps your bones and helps with mood. It also decreases numerous cancer risks so please take it as directed.  Low Vit D is associated with a 200-300% higher risk for CANCER  and 200-300% higher risk for HEART   ATTACK  &  STROKE.   ...................................... It is also associated with higher death rate at younger ages,  autoimmune diseases like Rheumatoid arthritis, Lupus, Multiple Sclerosis.    Also many other serious conditions, like depression, Alzheimer's Dementia, infertility, muscle aches, fatigue, fibromyalgia - just to name a few. ++++++++++++++++++++++ Recommend the book "The END of DIETING" by Dr Joel Fuhrman  & the book "The END of DIABETES " by Dr Joel Fuhrman At  [employer] - get book & Audio CD's    Being diabetic has a  300% increased risk for heart attack, stroke, cancer, and alzheimer- type vascular dementia. It is very important that you work harder with diet by avoiding all foods that are white. Avoid white rice (brown & wild rice is OK), white potatoes (sweetpotatoes in moderation is OK), White bread or wheat bread or anything made out of white flour like bagels, donuts, rolls, buns, biscuits, cakes, pastries, cookies, pizza crust, and pasta (made from white flour & egg whites) - vegetarian pasta or spinach or wheat pasta is OK. Multigrain breads like Arnold's or [employer], or multigrain sandwich thins or flatbreads.  Diet, exercise and weight loss can reverse and cure diabetes in the early stages.  Diet, exercise and weight loss is very important in the control and prevention of complications of diabetes which affects every system in your body, ie. Brain - dementia/stroke, eyes - glaucoma/blindness, heart - heart attack/heart failure, kidneys - dialysis, stomach - gastric paralysis, intestines - malabsorption, nerves - severe painful neuritis, circulation - gangrene & loss of a leg(s), and finally cancer and Alzheimers.    I recommend avoid fried & greasy foods,  sweets/candy, white rice (brown or wild rice or Quinoa is OK), white potatoes (sweet potatoes are OK) - anything made from white flour - bagels, doughnuts, rolls, buns, biscuits,white and wheat breads, pizza crust and traditional pasta made of white flour & egg white(vegetarian pasta or spinach or wheat pasta is OK).  Multi-grain bread is OK - like multi-grain flat bread or sandwich thins. Avoid alcohol in excess. Exercise is also important.    Eat all the vegetables you want - avoid meat, especially red meat and dairy - especially cheese.  Cheese is the most concentrated form of trans-fats which is the worst thing to clog up our arteries. Veggie cheese is OK which can be found in the fresh  produce section at [employer]   or [employer] or [employer]  ++++++++++++++++++++++ DASH Eating Plan  DASH stands for "Dietary Approaches to Stop Hypertension."   The DASH eating plan is a healthy eating plan that has been shown to reduce high blood pressure (hypertension). Additional health benefits may include reducing the risk of type 2 diabetes mellitus, heart disease, and stroke. The DASH eating plan may also help with weight loss. WHAT DO I NEED TO KNOW ABOUT THE DASH EATING PLAN? For the DASH eating plan, you will follow these general guidelines:  Choose foods with a percent daily value for sodium of less than 5% (as listed on the food label).  Use salt-free seasonings or herbs instead of table salt or sea salt.  Check with your health care provider or pharmacist before using salt substitutes.  Eat lower-sodium products, often labeled as "lower sodium" or "no salt added."  Eat fresh foods.  Eat more vegetables, fruits, and low-fat dairy products.  Choose whole grains. Look for the word "whole" as the first word in the ingredient list.  Choose fish   Limit sweets, desserts, sugars, and sugary drinks.  Choose heart-healthy fats.  Eat veggie cheese   Eat more home-cooked food and less restaurant, buffet, and fast food.  Limit fried foods.  Cook foods using methods other than frying.  Limit canned vegetables. If you do use them, rinse them well to decrease the sodium.  When eating at a restaurant, ask that your food be prepared with less salt, or no salt if possible.                      WHAT FOODS CAN I EAT? Read Dr Joel Fuhrman's books on The End of Dieting & The End of Diabetes  Grains Whole grain or whole wheat bread. Brown rice. Whole grain or whole wheat pasta. Quinoa, bulgur, and whole grain cereals. Low-sodium cereals. Corn or whole wheat flour tortillas. Whole grain cornbread. Whole grain crackers. Low-sodium crackers.  Vegetables Fresh or frozen  vegetables (raw, steamed, roasted, or grilled). Low-sodium or reduced-sodium tomato and vegetable juices. Low-sodium or reduced-sodium tomato sauce and paste. Low-sodium or reduced-sodium canned vegetables.   Fruits All fresh, canned (in natural juice), or frozen fruits.  Protein Products  All fish and seafood.  Dried beans, peas, or lentils. Unsalted nuts and seeds. Unsalted canned beans.  Dairy Low-fat dairy products, such as skim or 1% milk, 2% or reduced-fat cheeses, low-fat ricotta or cottage cheese, or plain low-fat yogurt. Low-sodium or reduced-sodium cheeses.  Fats and Oils Tub margarines without trans fats. Light or reduced-fat mayonnaise and salad dressings (reduced sodium). Avocado. Safflower, olive, or canola oils. Natural peanut or almond butter.  Other Unsalted popcorn and pretzels. The items listed above may not be a complete list of recommended foods or beverages. Contact your dietitian for more options.  ++++++++++++++++++++  WHAT FOODS ARE NOT RECOMMENDED? Grains/ White flour or wheat flour White bread. White pasta. White rice. Refined cornbread. Bagels and croissants. Crackers that contain trans fat.  Vegetables  Creamed or fried vegetables. Vegetables in a . Regular canned vegetables. Regular canned tomato sauce and paste. Regular tomato and vegetable juices.  Fruits Dried fruits. Canned fruit in light or heavy syrup. Fruit juice.  Meat and Other Protein Products Meat in general - RED meat & White meat.  Fatty cuts of meat. Ribs, chicken wings, all processed meats as bacon, sausage, bologna, salami, fatback, hot dogs, bratwurst and packaged luncheon meats.  Dairy Whole or 2% milk, cream,   half-and-half, and cream cheese. Whole-fat or sweetened yogurt. Full-fat cheeses or blue cheese. Non-dairy creamers and whipped toppings. Processed cheese, cheese spreads, or cheese curds.  Condiments Onion and garlic salt, seasoned salt, table salt, and sea salt. Canned and  packaged gravies. Worcestershire sauce. Tartar sauce. Barbecue sauce. Teriyaki sauce. Soy sauce, including reduced sodium. Steak sauce. Fish sauce. Oyster sauce. Cocktail sauce. Horseradish. Ketchup and mustard. Meat flavorings and tenderizers. Bouillon cubes. Hot sauce. Tabasco sauce. Marinades. Taco seasonings. Relishes.  Fats and Oils Butter, stick margarine, lard, shortening and bacon fat. Coconut, palm kernel, or palm oils. Regular salad dressings.  Pickles and olives. Salted popcorn and pretzels.  The items listed above may not be a complete list of foods and beverages to avoid.    

## 2018-01-11 LAB — CBC WITH DIFFERENTIAL/PLATELET
Basophils Absolute: 30 cells/uL (ref 0–200)
Basophils Relative: 0.5 %
Eosinophils Absolute: 180 cells/uL (ref 15–500)
Eosinophils Relative: 3 %
HCT: 49.4 % (ref 38.5–50.0)
Hemoglobin: 18 g/dL — ABNORMAL HIGH (ref 13.2–17.1)
Lymphs Abs: 1164 cells/uL (ref 850–3900)
MCH: 33 pg (ref 27.0–33.0)
MCHC: 36.4 g/dL — ABNORMAL HIGH (ref 32.0–36.0)
MCV: 90.6 fL (ref 80.0–100.0)
MPV: 10.6 fL (ref 7.5–12.5)
Monocytes Relative: 11.7 %
Neutro Abs: 3924 cells/uL (ref 1500–7800)
Neutrophils Relative %: 65.4 %
Platelets: 172 10*3/uL (ref 140–400)
RBC: 5.45 10*6/uL (ref 4.20–5.80)
RDW: 12.6 % (ref 11.0–15.0)
Total Lymphocyte: 19.4 %
WBC mixed population: 702 cells/uL (ref 200–950)
WBC: 6 10*3/uL (ref 3.8–10.8)

## 2018-01-11 LAB — URINALYSIS, ROUTINE W REFLEX MICROSCOPIC
Bacteria, UA: NONE SEEN /HPF
Bilirubin Urine: NEGATIVE
Glucose, UA: NEGATIVE
Hgb urine dipstick: NEGATIVE
Hyaline Cast: NONE SEEN /LPF
Ketones, ur: NEGATIVE
Nitrite: NEGATIVE
Protein, ur: NEGATIVE
RBC / HPF: NONE SEEN /HPF (ref 0–2)
Specific Gravity, Urine: 1.014 (ref 1.001–1.03)
Squamous Epithelial / LPF: NONE SEEN /HPF (ref <=5)
pH: 5.5 (ref 5.0–8.0)

## 2018-01-11 LAB — LIPID PANEL
Cholesterol: 213 mg/dL — ABNORMAL HIGH (ref <200)
HDL: 45 mg/dL (ref >40)
LDL Cholesterol (Calc): 129 mg/dL (calc) — ABNORMAL HIGH
Non-HDL Cholesterol (Calc): 168 mg/dL (calc) — ABNORMAL HIGH (ref <130)
Total CHOL/HDL Ratio: 4.7 (calc) (ref <5.0)
Triglycerides: 255 mg/dL — ABNORMAL HIGH (ref <150)

## 2018-01-11 LAB — COMPLETE METABOLIC PANEL WITH GFR
AG Ratio: 1.8 (calc) (ref 1.0–2.5)
ALT: 32 U/L (ref 9–46)
AST: 22 U/L (ref 10–35)
Albumin: 4.2 g/dL (ref 3.6–5.1)
Alkaline phosphatase (APISO): 63 U/L (ref 40–115)
BUN: 19 mg/dL (ref 7–25)
CO2: 27 mmol/L (ref 20–32)
Calcium: 9.5 mg/dL (ref 8.6–10.3)
Chloride: 104 mmol/L (ref 98–110)
Creat: 1.08 mg/dL (ref 0.70–1.18)
GFR, Est African American: 80 mL/min/{1.73_m2} (ref >=60)
GFR, Est Non African American: 69 mL/min/{1.73_m2} (ref >=60)
Globulin: 2.3 g/dL (calc) (ref 1.9–3.7)
Glucose, Bld: 120 mg/dL — ABNORMAL HIGH (ref 65–99)
Potassium: 4 mmol/L (ref 3.5–5.3)
Sodium: 139 mmol/L (ref 135–146)
Total Bilirubin: 0.6 mg/dL (ref 0.2–1.2)
Total Protein: 6.5 g/dL (ref 6.1–8.1)

## 2018-01-11 LAB — TESTOSTERONE: Testosterone: 368 ng/dL (ref 250–827)

## 2018-01-11 LAB — TSH: TSH: 3.16 mIU/L (ref 0.40–4.50)

## 2018-01-11 LAB — MAGNESIUM: Magnesium: 1.8 mg/dL (ref 1.5–2.5)

## 2018-01-11 LAB — MICROALBUMIN / CREATININE URINE RATIO
Creatinine, Urine: 95 mg/dL (ref 20–320)
Microalb Creat Ratio: 4 mcg/mg creat (ref <30)
Microalb, Ur: 0.4 mg/dL

## 2018-01-11 LAB — HEMOGLOBIN A1C
Hgb A1c MFr Bld: 5.3 % of total Hgb (ref <5.7)
Mean Plasma Glucose: 105 (calc)
eAG (mmol/L): 5.8 (calc)

## 2018-01-11 LAB — VITAMIN D 25 HYDROXY (VIT D DEFICIENCY, FRACTURES): Vit D, 25-Hydroxy: 55 ng/mL (ref 30–100)

## 2018-01-11 LAB — INSULIN, RANDOM: Insulin: 12 u[IU]/mL (ref 2.0–19.6)

## 2018-01-11 LAB — PSA: PSA: 2 ng/mL (ref <=4.0)

## 2018-01-12 ENCOUNTER — Other Ambulatory Visit: Payer: Self-pay | Admitting: Internal Medicine

## 2018-01-12 MED ORDER — ROSUVASTATIN CALCIUM 40 MG PO TABS: ORAL_TABLET | ORAL | 5 refills | Status: DC

## 2018-01-13 ENCOUNTER — Encounter: Payer: Self-pay | Admitting: Internal Medicine

## 2018-03-12 ENCOUNTER — Other Ambulatory Visit: Payer: Self-pay | Admitting: *Deleted

## 2018-03-12 DIAGNOSIS — K219 Gastro-esophageal reflux disease without esophagitis: Secondary | ICD-10-CM

## 2018-03-12 MED ORDER — RANITIDINE HCL 300 MG PO TABS
ORAL_TABLET | ORAL | 1 refills | Status: DC
Start: 2018-03-12 — End: 2018-05-06

## 2018-04-22 ENCOUNTER — Other Ambulatory Visit: Payer: Self-pay | Admitting: Physician Assistant

## 2018-05-02 NOTE — Progress Notes (Signed)
FOLLOW UP  Assessment and Plan:   Hypertension At goal with current medications; try taking losartan at night - NEED TO CHECK BPs when feeling fatigued Monitor blood pressure at home; patient to call if consistently greater than 130/80 Continue DASH diet.   Reminder to go to the ER if any CP, SOB, nausea, dizziness, severe HA, changes vision/speech, left arm numbness and tingling and jaw pain.  Cholesterol Currently above goal; mild LDL elevations and severe triglycerides - fenofibrate discussed should triglycerides not be significantly improved on today's check  Continue low cholesterol diet and exercise.  Check lipid panel.   Other abnormal glucose A1Cs recently well controlled Continue diet and exercise.  Perform daily foot/skin check, notify office of any concerning changes.  Defer A1C; check CMP  Obesity with co morbidities Long discussion about weight loss, diet, and exercise Recommended diet heavy in fruits and veggies and low in animal meats, cheeses, and dairy products, appropriate calorie intake Discussed ideal weight for height  Patient unwilling to initiate any changes at this time Will follow up in 3 months  Vitamin D Def Not at goal at last visit; dose was not increased Defer Vit D level   Continue diet and meds as discussed. Further disposition pending results of labs. Discussed med's effects and SE's.   Over 30 minutes of exam, counseling, chart review, and critical decision making was performed.   Future Appointments  Date Time Provider [place]  08/09/2018  9:30 AM [name], MD [facility] None  02/07/2019  9:00 AM [name], MD [facility] None    ----------------------------------------------------------------------------------------------------------------------  HPI 72 y.o. male  presents for 3 month follow up on hypertension, cholesterol,glucose management, obesity and vitamin D deficiency.   BMI is Body mass index is 30.28  kg/m., he has not been working on diet and exercise - works a physically intense job. He drinks water during the day, gets 4-5 bottles in daily. He sleeps well. Eats 3 meals, dinner at home, picks up [employer]  Wt Readings from Last 3 Encounters:  05/06/18 211 lb (95.7 kg)  01/10/18 211 lb 12.8 oz (96.1 kg)  10/12/17 215 lb (97.5 kg)   he has a diagnosis of GERD which is currently managed by protonix 40 mg, didn't tolerate switch to H2i.  he reports symptoms is currently well controlled, and denies breakthrough reflux, burning in chest, hoarseness.    He does not check his blood pressure, today their BP is BP: 138/82  He does not workout. He denies chest pain, shortness of breath, dizziness. He does endorse AM fatigue.    He is not on cholesterol medication (rosuvastatin 40 mg daily, supplements omega 3) and denies myalgias. His cholesterol is not at goal. The cholesterol last visit was:   Lab Results  Component Value Date   CHOL 213 (H) 01/10/2018   HDL 45 01/10/2018   LDLCALC 129 (H) 01/10/2018   LDLDIRECT 151.0 02/10/2010   TRIG 255 (H) 01/10/2018   CHOLHDL 4.7 01/10/2018    He has not been working on diet and exercise for glucose management, and denies increased appetite, nausea, paresthesia of the feet, polydipsia, polyuria, visual disturbances and vomiting. Last A1C in the office was:  Lab Results  Component Value Date   HGBA1C 5.3 01/10/2018   Patient is on Vitamin D supplement but below goal at last visit. Dose was changed after the last visit:    Lab Results  Component Value Date   VD25OH 55 01/10/2018     He has  a history of testosterone deficiency and is on testosterone replacement. He states that the testosterone helps with his energy, libido, muscle mass. Lab Results  Component Value Date   TESTOSTERONE 368 01/10/2018     Current Medications:  Current Outpatient Medications on File Prior to Visit  Medication Sig  . aspirin (ASPIR-LOW) 81 MG EC tablet Take 81  mg by mouth daily.    Marland Kitchen buPROPion (WELLBUTRIN XL) 300 MG 24 hr tablet TAKE 1 TABLET DAILY FOR MOOD  . cetirizine (ZYRTEC) 10 MG tablet Take 1 tablet (10 mg total) by mouth at bedtime.  Marland Kitchen desoximetasone (TOPICORT) 0.25 % cream Apply 1 application topically 2 (two) times daily.  . diclofenac sodium (VOLTAREN) 1 % GEL Apply 2 g topically 4 (four) times daily.  . fish oil-omega-3 fatty acids 1000 MG capsule Take 1 capsule by mouth 2 (two) times daily.    . hydrochlorothiazide (HYDRODIURIL) 25 MG tablet Take 1 tablet daily for BP and fluid  . losartan (COZAAR) 100 MG tablet Take 1 tablet (100 mg total) by mouth daily.  . Magnesium 250 MG TABS Take 1 tablet by mouth 2 (two) times daily.    . metoCLOPramide (REGLAN) 10 MG tablet Take 1 tablet 4 x / day before meals & 30 min before hour of sleep for Acid Reflux  . Multiple Vitamins-Minerals (STRESS B COMPLEX/ZINC) TABS Take 1 tablet by mouth daily.    . pantoprazole (PROTONIX) 40 MG tablet Take 40 mg by mouth daily.  . rosuvastatin (CRESTOR) 40 MG tablet Take 1/2 to 1 tablet daily or as directed for Cholesterol  . valACYclovir (VALTREX) 1000 MG tablet   . ranitidine (ZANTAC) 300 MG tablet Take 1 to 2 tablets / daily for Indigestion & Heartburn  . Testosterone (ANDROGEL PUMP) 20.25 MG/ACT (1.62%) GEL Apply 4 pumps daily  - disp 6 bottles (90 day supply) with refills for 6 months   No current facility-administered medications on file prior to visit.      Allergies:  Allergies  Allergen Reactions  . Atenolol   . Atorvastatin   . Darvocet [Propoxyphene N-Acetaminophen]   . Paroxetine   . Pregabalin   . Propoxyphene Hcl      Medical History:  Past Medical History:  Diagnosis Date  . BPH (benign prostatic hypertrophy)   . 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 Erythrocytosis 04/28/2014  . Exertional dyspnea    ETT myoview 5/11: 8 min, no ischemic ECG changes, stopped due to fatigue/ SOB. EF 60%, no ischemia or infarction.  Echo 5/11 EF 60%, no significant valvular abnormalities, mild RV dilation and dysfunction, no complete TR doppler jet so PA pressure hard to gauge. 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 R heart cath 6/11: mean RA 10 mmHg, PA 25/18, mean PCWP 13 mmHg, CI 2.1  . Fibromyalgia   . GERD (gastroesophageal reflux disease)   . History of epididymitis   . History of head injury without skull fracture 1980   Concussion  . Hx of adenomatous polyp of colon 06/19/2007  . Hyperlipidemia    Myalgias with Lipitor  . Hypertension   . Hypogonadism male   . Myofascial pain syndrome   . Nephrolithiasis   . Prolapsed internal hemorrhoids   . Prostatitis    Recurrent  . Rheumatic fever    x3 at ages [age] and 26; No significant valvular abnormality on 5/11 echo  . Tendinitis 2000   Hx of left bicep  . Vitamin D  deficiency    Family history- Reviewed and unchanged Social history- Reviewed and unchanged   Review of Systems:  Review of Systems  Constitutional: Positive for malaise/fatigue (AM, improves after lunch). Negative for weight loss.  HENT: Positive for hearing loss (Chronic ongoing, has never had hearing checked) and tinnitus (Chronic ongoing). Negative for congestion, ear discharge, ear pain, nosebleeds, sinus pain and sore throat.   Eyes: Negative for blurred vision and double vision.  Respiratory: Negative for cough, shortness of breath, wheezing and stridor.   Cardiovascular: Negative for chest pain, palpitations, orthopnea, claudication and leg swelling.  Gastrointestinal: Negative for abdominal pain, blood in stool, constipation, diarrhea, heartburn, melena, nausea and vomiting.  Genitourinary: Negative.   Musculoskeletal: Negative for joint pain and myalgias.  Skin: Negative for rash.  Neurological: Negative for dizziness, tingling, sensory change, weakness and headaches.  Endo/Heme/Allergies: Negative for polydipsia.  Psychiatric/Behavioral: Negative.   Negative for substance abuse. The patient is not nervous/anxious and does not have insomnia.   All other systems reviewed and are negative.   Physical Exam: BP 138/82   Pulse 68   Temp (!) 97.3 F (36.3 C)   Ht 5\' 10"  (1.778 m)   Wt 211 lb (95.7 kg)   SpO2 92%   BMI 30.28 kg/m  Wt Readings from Last 3 Encounters:  05/06/18 211 lb (95.7 kg)  01/10/18 211 lb 12.8 oz (96.1 kg)  10/12/17 215 lb (97.5 kg)   General Appearance: Well nourished, in no apparent distress. Eyes: PERRLA, EOMs, conjunctiva no swelling or erythema Sinuses: No Frontal/maxillary tenderness ENT/Mouth: R Ext aud canal clear, L obstructed by dry cerumen. R TM without erythema, bulging. No erythema, swelling, or exudate on post pharynx.  Tonsils not swollen or erythematous. Hearing normal.  Neck: Supple, thyroid normal.  Respiratory: Respiratory effort normal, BS equal bilaterally without rales, rhonchi, wheezing or stridor. Somewhat coarse throughout. Cardio: RRR with no MRGs. Brisk peripheral pulses without edema.  Abdomen: Soft, + BS.  Non tender, no guarding, rebound, hernias, masses. Lymphatics: Non tender without lymphadenopathy.  Musculoskeletal: Full ROM, 5/5 strength, Normal gait Skin: Warm, dry without rashes, lesions, ecchymosis.  Neuro: Cranial nerves intact. No cerebellar symptoms.  Psych: Awake and oriented X 3, normal affect, Insight and Judgment appropriate.    Izora Ribas, NP 9:40 AM [facility]

## 2018-05-06 ENCOUNTER — Encounter: Payer: Self-pay | Admitting: Adult Health

## 2018-05-06 ENCOUNTER — Ambulatory Visit (INDEPENDENT_AMBULATORY_CARE_PROVIDER_SITE_OTHER): Payer: Medicare Other | Admitting: Adult Health

## 2018-05-06 VITALS — BP 138/82 | HR 68 | Temp 97.3°F | Ht 70.0 in | Wt 211.0 lb

## 2018-05-06 DIAGNOSIS — K219 Gastro-esophageal reflux disease without esophagitis: Secondary | ICD-10-CM

## 2018-05-06 DIAGNOSIS — E782 Mixed hyperlipidemia: Secondary | ICD-10-CM

## 2018-05-06 DIAGNOSIS — E559 Vitamin D deficiency, unspecified: Secondary | ICD-10-CM

## 2018-05-06 DIAGNOSIS — E669 Obesity, unspecified: Secondary | ICD-10-CM

## 2018-05-06 DIAGNOSIS — I10 Essential (primary) hypertension: Secondary | ICD-10-CM | POA: Diagnosis not present

## 2018-05-06 DIAGNOSIS — Z79899 Other long term (current) drug therapy: Secondary | ICD-10-CM

## 2018-05-06 DIAGNOSIS — E291 Testicular hypofunction: Secondary | ICD-10-CM | POA: Diagnosis not present

## 2018-05-06 DIAGNOSIS — E66811 Obesity, class 1: Secondary | ICD-10-CM

## 2018-05-06 DIAGNOSIS — R7309 Other abnormal glucose: Secondary | ICD-10-CM

## 2018-05-06 NOTE — Patient Instructions (Addendum)
Check your blood pressure when feeling sluggish or swimmy headed  Try taking losartan at night, hydrochlorothiazide in the morning  If you have blood pressures < 110/60 AND having fatigue, dizziness, etc we may need to adjust your blood pressure medications.       Know what a healthy weight is for you (roughly BMI <25) and aim to maintain this  Aim for 7+ servings of fruits and vegetables daily  65-80+ fluid ounces of water or unsweet tea for healthy kidneys  Limit to max 1 drink of alcohol per day; avoid smoking/tobacco  Limit animal fats in diet for cholesterol and heart health - choose grass fed whenever available  Avoid highly processed foods, and foods high in saturated/trans fats  Aim for low stress - take time to unwind and care for your mental health  Aim for 150 min of moderate intensity exercise weekly for heart health, and weights twice weekly for bone health  Aim for 7-9 hours of sleep daily      When it comes to diets, agreement about the perfect plan isn't easy to find, even among the experts. Experts at the [employer] developed an idea known as the Healthy Eating Plate. Just imagine a plate divided into logical, healthy portions.  The emphasis is on diet quality:  Load up on vegetables and fruits - one-half of your plate: Aim for color and variety, and remember that potatoes don't count.  Go for whole grains - one-quarter of your plate: Whole wheat, barley, wheat berries, quinoa, oats, brown rice, and foods made with them. If you want pasta, go with whole wheat pasta.  Protein power - one-quarter of your plate: Fish, chicken, beans, and nuts are all healthy, versatile protein sources. Limit red meat.  The diet, however, does go beyond the plate, offering a few other suggestions.  Use healthy plant oils, such as olive, canola, soy, corn, sunflower and peanut. Check the labels, and avoid partially hydrogenated oil, which have unhealthy  trans fats.  If you're thirsty, drink water. Coffee and tea are good in moderation, but skip sugary drinks and limit milk and dairy products to one or two daily servings.  The type of carbohydrate in the diet is more important than the amount. Some sources of carbohydrates, such as vegetables, fruits, whole grains, and beans-are healthier than others.  Finally, stay active.

## 2018-05-07 LAB — COMPLETE METABOLIC PANEL WITH GFR
AG Ratio: 2.2 (calc) (ref 1.0–2.5)
ALT: 44 U/L (ref 9–46)
AST: 24 U/L (ref 10–35)
Albumin: 4.6 g/dL (ref 3.6–5.1)
Alkaline phosphatase (APISO): 53 U/L (ref 40–115)
BUN/Creatinine Ratio: 16 (calc) (ref 6–22)
BUN: 19 mg/dL (ref 7–25)
CO2: 29 mmol/L (ref 20–32)
Calcium: 9.5 mg/dL (ref 8.6–10.3)
Chloride: 104 mmol/L (ref 98–110)
Creat: 1.21 mg/dL — ABNORMAL HIGH (ref 0.70–1.18)
GFR, Est African American: 69 mL/min/{1.73_m2} (ref >=60)
GFR, Est Non African American: 60 mL/min/{1.73_m2} (ref >=60)
Globulin: 2.1 g/dL (calc) (ref 1.9–3.7)
Glucose, Bld: 112 mg/dL — ABNORMAL HIGH (ref 65–99)
Potassium: 4 mmol/L (ref 3.5–5.3)
Sodium: 141 mmol/L (ref 135–146)
Total Bilirubin: 1 mg/dL (ref 0.2–1.2)
Total Protein: 6.7 g/dL (ref 6.1–8.1)

## 2018-05-07 LAB — LIPID PANEL
Cholesterol: 124 mg/dL (ref <200)
HDL: 50 mg/dL (ref >40)
LDL Cholesterol (Calc): 54 mg/dL (calc)
Non-HDL Cholesterol (Calc): 74 mg/dL (calc) (ref <130)
Total CHOL/HDL Ratio: 2.5 (calc) (ref <5.0)
Triglycerides: 117 mg/dL (ref <150)

## 2018-05-07 LAB — CBC WITH DIFFERENTIAL/PLATELET
Basophils Absolute: 30 cells/uL (ref 0–200)
Basophils Relative: 0.4 %
Eosinophils Absolute: 170 cells/uL (ref 15–500)
Eosinophils Relative: 2.3 %
HCT: 47.2 % (ref 38.5–50.0)
Hemoglobin: 16.8 g/dL (ref 13.2–17.1)
Lymphs Abs: 1339 cells/uL (ref 850–3900)
MCH: 33.4 pg — ABNORMAL HIGH (ref 27.0–33.0)
MCHC: 35.6 g/dL (ref 32.0–36.0)
MCV: 93.8 fL (ref 80.0–100.0)
MPV: 11.5 fL (ref 7.5–12.5)
Monocytes Relative: 9.8 %
Neutro Abs: 5136 cells/uL (ref 1500–7800)
Neutrophils Relative %: 69.4 %
Platelets: 169 10*3/uL (ref 140–400)
RBC: 5.03 10*6/uL (ref 4.20–5.80)
RDW: 12.6 % (ref 11.0–15.0)
Total Lymphocyte: 18.1 %
WBC mixed population: 725 cells/uL (ref 200–950)
WBC: 7.4 10*3/uL (ref 3.8–10.8)

## 2018-05-07 LAB — MAGNESIUM: Magnesium: 1.9 mg/dL (ref 1.5–2.5)

## 2018-05-07 LAB — TSH: TSH: 2.5 mIU/L (ref 0.40–4.50)

## 2018-05-14 ENCOUNTER — Other Ambulatory Visit: Payer: Self-pay | Admitting: Adult Health

## 2018-05-14 DIAGNOSIS — I10 Essential (primary) hypertension: Secondary | ICD-10-CM

## 2018-07-01 ENCOUNTER — Other Ambulatory Visit: Payer: Self-pay | Admitting: Internal Medicine

## 2018-07-24 ENCOUNTER — Other Ambulatory Visit: Payer: Self-pay | Admitting: Internal Medicine

## 2018-07-24 DIAGNOSIS — E291 Testicular hypofunction: Secondary | ICD-10-CM

## 2018-07-24 MED ORDER — TESTOSTERONE 20.25 MG/ACT (1.62%) TD GEL: TRANSDERMAL | 1 refills | Status: DC

## 2018-08-09 ENCOUNTER — Ambulatory Visit (INDEPENDENT_AMBULATORY_CARE_PROVIDER_SITE_OTHER): Payer: Medicare Other | Admitting: Internal Medicine

## 2018-08-09 ENCOUNTER — Encounter: Payer: Self-pay | Admitting: Internal Medicine

## 2018-08-09 VITALS — BP 128/88 | HR 69 | Temp 97.8°F | Resp 14 | Ht 70.0 in | Wt 208.2 lb

## 2018-08-09 DIAGNOSIS — Z79899 Other long term (current) drug therapy: Secondary | ICD-10-CM

## 2018-08-09 DIAGNOSIS — R7303 Prediabetes: Secondary | ICD-10-CM

## 2018-08-09 DIAGNOSIS — R7309 Other abnormal glucose: Secondary | ICD-10-CM | POA: Diagnosis not present

## 2018-08-09 DIAGNOSIS — K219 Gastro-esophageal reflux disease without esophagitis: Secondary | ICD-10-CM

## 2018-08-09 DIAGNOSIS — E559 Vitamin D deficiency, unspecified: Secondary | ICD-10-CM

## 2018-08-09 DIAGNOSIS — I10 Essential (primary) hypertension: Secondary | ICD-10-CM | POA: Diagnosis not present

## 2018-08-09 DIAGNOSIS — E291 Testicular hypofunction: Secondary | ICD-10-CM

## 2018-08-09 DIAGNOSIS — E782 Mixed hyperlipidemia: Secondary | ICD-10-CM

## 2018-08-09 NOTE — Progress Notes (Signed)
This very nice 72 y.o. MWM presents for 6 month follow up with HTN, HLD, Pre-Diabetes and Vitamin D Deficiency. His GERD is controlled on his meds.     Patient is treated for HTN (1994) & BP has been controlled at home. Today's BP is borderline elevated at - 128/88. Heart cath in 2011 was Negative. Patient has had no complaints of any cardiac type chest pain, palpitations, dyspnea / orthopnea / PND, dizziness, claudication, or dependent edema.     Hyperlipidemia is not  controlled with diet & meds. Patient denies myalgias or other med SE's. Last Lipids were not at goal: Lab Results  Component Value Date   CHOL 198 08/09/2018   HDL 50 08/09/2018   LDLCALC 116 (H) 08/09/2018   LDLDIRECT 151.0 02/10/2010   TRIG 197 (H) 08/09/2018   CHOLHDL 4.0 08/09/2018      Patient has been on Testosterone Replacement with Testosterone Gel since 2013 with improve stamina & well-being.       Also, the patient has history of PreDiabetes and has had no symptoms of reactive hypoglycemia, diabetic polys, paresthesias or visual blurring.  Last A1c was Normal & at goal: Lab Results  Component Value Date   HGBA1C 5.1 08/09/2018      Further, the patient also has history of Vitamin D Deficiency ("38" / 2016)  and supplements vitamin D without any suspected side-effects. Last vitamin D was at goal: Lab Results  Component Value Date   VD25OH 58 08/09/2018   Current Outpatient Medications on File Prior to Visit  Medication Sig  . aspirin (ASPIR-LOW) 81 MG EC tablet Take 81 mg by mouth daily.    Marland Kitchen buPROPion (WELLBUTRIN XL) 300 MG 24 hr tablet TAKE 1 TABLET DAILY FOR MOOD  . cetirizine (ZYRTEC) 10 MG tablet Take 1 tablet (10 mg total) by mouth at bedtime.  Marland Kitchen desoximetasone (TOPICORT) 0.25 % cream Apply 1 application topically 2 (two) times daily.  . diclofenac sodium (VOLTAREN) 1 % GEL Apply 2 g topically 4 (four) times daily.  . fish oil-omega-3 fatty acids 1000 MG capsule Take 1 capsule by mouth 2 (two)  times daily.    . hydrochlorothiazide (HYDRODIURIL) 25 MG tablet TAKE 1 TABLET BY MOUTH ONCE DAILY FOR BLOOD PRESSURE AND  FLUID  . losartan (COZAAR) 100 MG tablet TAKE 1 TABLET DAILY  . Magnesium 250 MG TABS Take 1 tablet by mouth 2 (two) times daily.    . metoCLOPramide (REGLAN) 10 MG tablet Take 1 tablet 4 x / day before meals & 30 min before hour of sleep for Acid Reflux  . Multiple Vitamins-Minerals (STRESS B COMPLEX/ZINC) TABS Take 1 tablet by mouth daily.    . pantoprazole (PROTONIX) 40 MG tablet Take 40 mg by mouth daily.  . rosuvastatin (CRESTOR) 40 MG tablet Take 1/2 to 1 tablet daily or as directed for Cholesterol  . Testosterone (ANDROGEL PUMP) 20.25 MG/ACT (1.62%) GEL Apply 4 pumps daily  - disp 6 bottles (90 day supply) with refills for 6 months  . valACYclovir (VALTREX) 1000 MG tablet    No current facility-administered medications on file prior to visit.    Allergies  Allergen Reactions  . Atenolol   . Atorvastatin   . Darvocet [Propoxyphene N-Acetaminophen]   . Paroxetine   . Pregabalin   . Propoxyphene Hcl    PMHx:   Past Medical History:  Diagnosis Date  . BPH (benign prostatic hypertrophy)   . 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  Erythrocytosis 04/28/2014  . Exertional dyspnea    ETT myoview 5/11: 8 min, no ischemic ECG changes, stopped due to fatigue/ SOB. EF 60%, no ischemia or infarction. Echo 5/11 EF 60%, no significant valvular abnormalities, mild RV dilation and dysfunction, no complete TR doppler jet so PA pressure hard to gauge. 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 R heart cath 6/11: mean RA 10 mmHg, PA 25/18, mean PCWP 13 mmHg, CI 2.1  . Fibromyalgia   . GERD (gastroesophageal reflux disease)   . History of epididymitis   . History of head injury without skull fracture 1980   Concussion  . Hx of adenomatous polyp of colon 06/19/2007  . Hyperlipidemia    Myalgias with Lipitor  . Hypertension   .  Hypogonadism male   . Myofascial pain syndrome   . Nephrolithiasis   . Prolapsed internal hemorrhoids   . Prostatitis    Recurrent  . Rheumatic fever    x3 at ages [age] and 83; No significant valvular abnormality on 5/11 echo  . Tendinitis 2000   Hx of left bicep  . Vitamin D deficiency    Immunization History  Administered Date(s) Administered  . Influenza, High Dose Seasonal PF 08/13/2014, 08/31/2015  . Pneumococcal Conjugate-13 08/13/2014  . Pneumococcal Polysaccharide-23 07/12/2012  . Tdap 04/25/2009  . Zoster 07/27/2007   Past Surgical History:  Procedure Laterality Date  . BIH REPAIR  08/2005   Incarverated fat by mesh repair  . CARDIAC CATHETERIZATION  02/2010   Left and right  . FINGER SURGERY  2000   Repair of injury to right fifth finger  . HEEL SPUR SURGERY  1960   Bilateral  . TONSILLECTOMY  1962   FHx:    Reviewed / unchanged  SHx:    Reviewed / unchanged   Systems Review:  Constitutional: Denies fever, chills, wt changes, headaches, insomnia, fatigue, night sweats, change in appetite. Eyes: Denies redness, blurred vision, diplopia, discharge, itchy, watery eyes.  ENT: Denies discharge, congestion, post nasal drip, epistaxis, sore throat, earache, hearing loss, dental pain, tinnitus, vertigo, sinus pain, snoring.  CV: Denies chest pain, palpitations, irregular heartbeat, syncope, dyspnea, diaphoresis, orthopnea, PND, claudication or edema. Respiratory: denies cough, dyspnea, DOE, pleurisy, hoarseness, laryngitis, wheezing.  Gastrointestinal: Denies dysphagia, odynophagia, heartburn, reflux, water brash, abdominal pain or cramps, nausea, vomiting, bloating, diarrhea, constipation, hematemesis, melena, hematochezia  or hemorrhoids. Genitourinary: Denies dysuria, frequency, urgency, nocturia, hesitancy, discharge, hematuria or flank pain. Musculoskeletal: Denies arthralgias, myalgias, stiffness, jt. swelling, pain, limping or strain/sprain.  Skin: Denies  pruritus, rash, hives, warts, acne, eczema or change in skin lesion(s). Neuro: No weakness, tremor, incoordination, spasms, paresthesia or pain. Psychiatric: Denies confusion, memory loss or sensory loss. Endo: Denies change in weight, skin or hair change.  Heme/Lymph: No excessive bleeding, bruising or enlarged lymph nodes.  Physical Exam  BP 128/88   Pulse 69   Temp 97.8 F (36.6 C)   Resp 14   Ht 5\' 10"  (1.778 m)   Wt 208 lb 3.2 oz (94.4 kg)   SpO2 96%   BMI 29.87 kg/m   Appears  well nourished, well groomed  and in no distress.  Eyes: PERRLA, EOMs, conjunctiva no swelling or erythema. Sinuses: No frontal/maxillary tenderness ENT/Mouth: EAC's clear, TM's nl w/o erythema, bulging. Nares clear w/o erythema, swelling, exudates. Oropharynx clear without erythema or exudates. Oral hygiene is good. Tongue normal, non obstructing. Hearing intact.  Neck: Supple. Thyroid not palpable. Car 2+/2+ without bruits, nodes or JVD.  Chest: Respirations nl with BS clear & equal w/o rales, rhonchi, wheezing or stridor.  Cor: Heart sounds normal w/ regular rate and rhythm without sig. murmurs, gallops, clicks or rubs. Peripheral pulses normal and equal  without edema.  Abdomen: Soft & bowel sounds normal. Non-tender w/o guarding, rebound, hernias, masses or organomegaly.  Lymphatics: Unremarkable.  Musculoskeletal: Full ROM all peripheral extremities, joint stability, 5/5 strength and normal gait.  Skin: Warm, dry without exposed rashes, lesions or ecchymosis apparent.  Neuro: Cranial nerves intact, reflexes equal bilaterally. Sensory-motor testing grossly intact. Tendon reflexes grossly intact.  Pysch: Alert & oriented x 3.  Insight and judgement nl & appropriate. No ideations.  Assessment and Plan:  1. Essential hypertension  - Continue medication, monitor blood pressure at home.  - Continue DASH diet.  Reminder to go to the ER if any CP,  SOB, nausea, dizziness, severe HA, changes  vision/speech.  - CBC with Differential/Platelet - COMPLETE METABOLIC PANEL WITH GFR - Magnesium - TSH  2. Hyperlipidemia, mixed  - Continue diet/meds, exercise,& lifestyle modifications.  - Continue monitor periodic cholesterol/liver & renal functions   - Lipid panel - TSH  3. Abnormal glucose  - Continue diet, exercise,  - lifestyle modifications.  - Monitor appropriate labs.  - Hemoglobin A1c - Insulin, random  4. Vitamin D deficiency  - Continue supplementation.  - VITAMIN D 25 Hydroxyl  5. Prediabetes  - Hemoglobin A1c - Insulin, random  6. Gastroesophageal reflux disease  - CBC with Differential/Platelet  7. Testosterone Deficiency  - Testosterone  8. Medication management  - CBC with Differential/Platelet - COMPLETE METABOLIC PANEL WITH GFR - Magnesium - Lipid panel - TSH - Hemoglobin A1c - Insulin, random - VITAMIN D 25 Hydroxyl - Testosterone       Discussed  regular exercise, BP monitoring, weight control to achieve/maintain BMI less than 25 and discussed med and SE's. Recommended labs to assess and monitor clinical status with further disposition pending results of labs. Over 30 minutes of exam, counseling, chart review was performed.

## 2018-08-09 NOTE — Patient Instructions (Signed)

## 2018-08-11 ENCOUNTER — Encounter: Payer: Self-pay | Admitting: Internal Medicine

## 2018-08-12 LAB — LIPID PANEL
Cholesterol: 198 mg/dL (ref <200)
HDL: 50 mg/dL (ref >40)
LDL Cholesterol (Calc): 116 mg/dL (calc) — ABNORMAL HIGH
Non-HDL Cholesterol (Calc): 148 mg/dL (calc) — ABNORMAL HIGH (ref <130)
Total CHOL/HDL Ratio: 4 (calc) (ref <5.0)
Triglycerides: 197 mg/dL — ABNORMAL HIGH (ref <150)

## 2018-08-12 LAB — CBC WITH DIFFERENTIAL/PLATELET
Basophils Absolute: 47 cells/uL (ref 0–200)
Basophils Relative: 0.6 %
Eosinophils Absolute: 379 cells/uL (ref 15–500)
Eosinophils Relative: 4.8 %
HCT: 47.5 % (ref 38.5–50.0)
Hemoglobin: 17.2 g/dL — ABNORMAL HIGH (ref 13.2–17.1)
Lymphs Abs: 1217 cells/uL (ref 850–3900)
MCH: 33 pg (ref 27.0–33.0)
MCHC: 36.2 g/dL — ABNORMAL HIGH (ref 32.0–36.0)
MCV: 91.2 fL (ref 80.0–100.0)
MPV: 11.5 fL (ref 7.5–12.5)
Monocytes Relative: 10.8 %
Neutro Abs: 5404 cells/uL (ref 1500–7800)
Neutrophils Relative %: 68.4 %
Platelets: 174 10*3/uL (ref 140–400)
RBC: 5.21 10*6/uL (ref 4.20–5.80)
RDW: 13 % (ref 11.0–15.0)
Total Lymphocyte: 15.4 %
WBC mixed population: 853 cells/uL (ref 200–950)
WBC: 7.9 10*3/uL (ref 3.8–10.8)

## 2018-08-12 LAB — VITAMIN D 25 HYDROXY (VIT D DEFICIENCY, FRACTURES): Vit D, 25-Hydroxy: 58 ng/mL (ref 30–100)

## 2018-08-12 LAB — COMPLETE METABOLIC PANEL WITH GFR
AG Ratio: 1.8 (calc) (ref 1.0–2.5)
ALT: 26 U/L (ref 9–46)
AST: 20 U/L (ref 10–35)
Albumin: 4 g/dL (ref 3.6–5.1)
Alkaline phosphatase (APISO): 45 U/L (ref 40–115)
BUN/Creatinine Ratio: 17 (calc) (ref 6–22)
BUN: 20 mg/dL (ref 7–25)
CO2: 26 mmol/L (ref 20–32)
Calcium: 8.7 mg/dL (ref 8.6–10.3)
Chloride: 103 mmol/L (ref 98–110)
Creat: 1.19 mg/dL — ABNORMAL HIGH (ref 0.70–1.18)
GFR, Est African American: 71 mL/min/{1.73_m2} (ref >=60)
GFR, Est Non African American: 61 mL/min/{1.73_m2} (ref >=60)
Globulin: 2.2 g/dL (calc) (ref 1.9–3.7)
Glucose, Bld: 105 mg/dL — ABNORMAL HIGH (ref 65–99)
Potassium: 3.8 mmol/L (ref 3.5–5.3)
Sodium: 140 mmol/L (ref 135–146)
Total Bilirubin: 0.8 mg/dL (ref 0.2–1.2)
Total Protein: 6.2 g/dL (ref 6.1–8.1)

## 2018-08-12 LAB — HEMOGLOBIN A1C
Hgb A1c MFr Bld: 5.1 % of total Hgb (ref <5.7)
Mean Plasma Glucose: 100 (calc)
eAG (mmol/L): 5.5 (calc)

## 2018-08-12 LAB — MAGNESIUM: Magnesium: 1.6 mg/dL (ref 1.5–2.5)

## 2018-08-12 LAB — INSULIN, RANDOM: Insulin: 35.6 u[IU]/mL — ABNORMAL HIGH (ref 2.0–19.6)

## 2018-08-12 LAB — TESTOSTERONE: Testosterone: 688 ng/dL (ref 250–827)

## 2018-08-12 LAB — TSH: TSH: 3.01 mIU/L (ref 0.40–4.50)

## 2018-08-14 NOTE — Progress Notes (Deleted)
Assessment and Plan:  1. Essential hypertension ***  2. Obesity (BMI 30.0-34.9) ***     Further disposition pending results of labs. Discussed med's effects and SE's.   Over 30 minutes of exam, counseling, chart review, and critical decision making was performed.   Future Appointments  Date Time Provider [place]  08/15/2018  2:00 PM Liane Comber, NP [facility] None  11/13/2018  9:00 AM Vicie Mutters, PA-C [facility] None  02/25/2019 11:00 AM [name], MD [facility] None    ------------------------------------------------------------------------------------------------------------------   HPI 72 y.o.male presents for evaluation of impacted ear canal ***  His blood pressure {HAS HAS NOT:18834} been controlled at home, today their BP is    He {DOES_DOES WER:15400} workout. He denies chest pain, shortness of breath, dizziness.   BMI is There is no height or weight on file to calculate BMI., he {HAS HAS QQP:61950} been working on diet and exercise. Wt Readings from Last 3 Encounters:  08/09/18 208 lb 3.2 oz (94.4 kg)  05/06/18 211 lb (95.7 kg)  01/10/18 211 lb 12.8 oz (96.1 kg)      Past Medical History:  Diagnosis Date  . BPH (benign prostatic hypertrophy)   . 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 Erythrocytosis 04/28/2014  . Exertional dyspnea    ETT myoview 5/11: 8 min, no ischemic ECG changes, stopped due to fatigue/ SOB. EF 60%, no ischemia or infarction. Echo 5/11 EF 60%, no significant valvular abnormalities, mild RV dilation and dysfunction, no complete TR doppler jet so PA pressure hard to gauge. 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 R heart cath 6/11: mean RA 10 mmHg, PA 25/18, mean PCWP 13 mmHg, CI 2.1  . Fibromyalgia   . GERD (gastroesophageal reflux disease)   . History of epididymitis   . History of head injury without skull fracture 1980   Concussion  . Hx of adenomatous polyp of colon  06/19/2007  . Hyperlipidemia    Myalgias with Lipitor  . Hypertension   . Hypogonadism male   . Myofascial pain syndrome   . Nephrolithiasis   . Prolapsed internal hemorrhoids   . Prostatitis    Recurrent  . Rheumatic fever    x3 at ages [age] and 36; No significant valvular abnormality on 5/11 echo  . Tendinitis 2000   Hx of left bicep  . Vitamin D deficiency      Allergies  Allergen Reactions  . Atenolol   . Atorvastatin   . Darvocet [Propoxyphene N-Acetaminophen]   . Paroxetine   . Pregabalin   . Propoxyphene Hcl     Current Outpatient Medications on File Prior to Visit  Medication Sig  . aspirin (ASPIR-LOW) 81 MG EC tablet Take 81 mg by mouth daily.    Marland Kitchen buPROPion (WELLBUTRIN XL) 300 MG 24 hr tablet TAKE 1 TABLET DAILY FOR MOOD  . cetirizine (ZYRTEC) 10 MG tablet Take 1 tablet (10 mg total) by mouth at bedtime.  Marland Kitchen desoximetasone (TOPICORT) 0.25 % cream Apply 1 application topically 2 (two) times daily.  . diclofenac sodium (VOLTAREN) 1 % GEL Apply 2 g topically 4 (four) times daily.  . fish oil-omega-3 fatty acids 1000 MG capsule Take 1 capsule by mouth 2 (two) times daily.    . hydrochlorothiazide (HYDRODIURIL) 25 MG tablet TAKE 1 TABLET BY MOUTH ONCE DAILY FOR BLOOD PRESSURE AND  FLUID  . losartan (COZAAR) 100 MG tablet TAKE 1 TABLET DAILY  . Magnesium 250 MG TABS Take 1 tablet by  mouth 2 (two) times daily.    . metoCLOPramide (REGLAN) 10 MG tablet Take 1 tablet 4 x / day before meals & 30 min before hour of sleep for Acid Reflux  . Multiple Vitamins-Minerals (STRESS B COMPLEX/ZINC) TABS Take 1 tablet by mouth daily.    . pantoprazole (PROTONIX) 40 MG tablet Take 40 mg by mouth daily.  . rosuvastatin (CRESTOR) 40 MG tablet Take 1/2 to 1 tablet daily or as directed for Cholesterol  . Testosterone (ANDROGEL PUMP) 20.25 MG/ACT (1.62%) GEL Apply 4 pumps daily  - disp 6 bottles (90 day supply) with refills for 6 months  . valACYclovir (VALTREX) 1000 MG tablet    No current  facility-administered medications on file prior to visit.     ROS: all negative except above.   Physical Exam:  There were no vitals taken for this visit.  General Appearance: Well nourished, in no apparent distress. Eyes: PERRLA, EOMs, conjunctiva no swelling or erythema Sinuses: No Frontal/maxillary tenderness ENT/Mouth: Ext aud canals clear, TMs without erythema, bulging. No erythema, swelling, or exudate on post pharynx.  Tonsils not swollen or erythematous. Hearing normal.  Neck: Supple, thyroid normal.  Respiratory: Respiratory effort normal, BS equal bilaterally without rales, rhonchi, wheezing or stridor.  Cardio: RRR with no MRGs. Brisk peripheral pulses without edema.  Abdomen: Soft, + BS.  Non tender, no guarding, rebound, hernias, masses. Lymphatics: Non tender without lymphadenopathy.  Musculoskeletal: Full ROM, 5/5 strength, normal gait.  Skin: Warm, dry without rashes, lesions, ecchymosis.  Neuro: Cranial nerves intact. Normal muscle tone, no cerebellar symptoms. Sensation intact.  Psych: Awake and oriented X 3, normal affect, Insight and Judgment appropriate.     Izora Ribas, NP 1:50 PM [facility]

## 2018-08-15 ENCOUNTER — Ambulatory Visit (INDEPENDENT_AMBULATORY_CARE_PROVIDER_SITE_OTHER): Payer: Medicare Other | Admitting: Adult Health

## 2018-08-15 ENCOUNTER — Encounter: Payer: Self-pay | Admitting: Adult Health

## 2018-08-15 VITALS — BP 126/80 | HR 83 | Temp 98.8°F | Ht 70.0 in | Wt 211.4 lb

## 2018-08-15 DIAGNOSIS — N401 Enlarged prostate with lower urinary tract symptoms: Secondary | ICD-10-CM | POA: Diagnosis not present

## 2018-08-15 DIAGNOSIS — R6889 Other general symptoms and signs: Secondary | ICD-10-CM

## 2018-08-15 DIAGNOSIS — R768 Other specified abnormal immunological findings in serum: Secondary | ICD-10-CM

## 2018-08-15 DIAGNOSIS — Z0000 Encounter for general adult medical examination without abnormal findings: Secondary | ICD-10-CM

## 2018-08-15 DIAGNOSIS — E291 Testicular hypofunction: Secondary | ICD-10-CM | POA: Diagnosis not present

## 2018-08-15 DIAGNOSIS — E559 Vitamin D deficiency, unspecified: Secondary | ICD-10-CM

## 2018-08-15 DIAGNOSIS — R350 Frequency of micturition: Secondary | ICD-10-CM

## 2018-08-15 DIAGNOSIS — Z79899 Other long term (current) drug therapy: Secondary | ICD-10-CM

## 2018-08-15 DIAGNOSIS — Z86010 Personal history of colonic polyps: Secondary | ICD-10-CM

## 2018-08-15 DIAGNOSIS — H9193 Unspecified hearing loss, bilateral: Secondary | ICD-10-CM

## 2018-08-15 DIAGNOSIS — K219 Gastro-esophageal reflux disease without esophagitis: Secondary | ICD-10-CM | POA: Diagnosis not present

## 2018-08-15 DIAGNOSIS — N529 Male erectile dysfunction, unspecified: Secondary | ICD-10-CM

## 2018-08-15 DIAGNOSIS — I10 Essential (primary) hypertension: Secondary | ICD-10-CM

## 2018-08-15 DIAGNOSIS — E782 Mixed hyperlipidemia: Secondary | ICD-10-CM

## 2018-08-15 DIAGNOSIS — Z0001 Encounter for general adult medical examination with abnormal findings: Secondary | ICD-10-CM

## 2018-08-15 DIAGNOSIS — M797 Fibromyalgia: Secondary | ICD-10-CM

## 2018-08-15 DIAGNOSIS — E669 Obesity, unspecified: Secondary | ICD-10-CM

## 2018-08-15 DIAGNOSIS — R7309 Other abnormal glucose: Secondary | ICD-10-CM

## 2018-08-15 NOTE — Patient Instructions (Addendum)
Curtis Price , Thank you for taking time to come for your Medicare Wellness Visit. I appreciate your ongoing commitment to your health goals. Please review the following plan we discussed and let me know if I can assist you in the future.   These are the goals we discussed: Goals    . Weight (lb) < 205 lb (93 kg)       This is a list of the screening recommended for you and due dates:  Health Maintenance  Topic Date Due  .  Hepatitis C: One time screening is recommended by Center for Disease Control  (CDC) for  adults born from [birth year] through 1964.   08/16/2019*  . Flu Shot  08/26/2019*  . Tetanus Vaccine  04/26/2019  . Colon Cancer Screening  06/08/2020  . Pneumonia vaccines  Completed  *Topic was postponed. The date shown is not the original due date.     Use a dropper or use a cap to put peroxide, olive oil,mineral oil or canola oil in the effected ear- 2-3 times a week. Let it soak for 20-30 min then you can take a shower or use a baby bulb with warm water to wash out the ear wax.  Do not use Qtips     Earwax Buildup, Adult The ears produce a substance called earwax that helps keep bacteria out of the ear and protects the skin in the ear canal. Occasionally, earwax can build up in the ear and cause discomfort or hearing loss. What increases the risk? This condition is more likely to develop in people who:  Are male.  Are elderly.  Naturally produce more earwax.  Clean their ears often with cotton swabs.  Use earplugs often.  Use in-ear headphones often.  Wear hearing aids.  Have narrow ear canals.  Have earwax that is overly thick or sticky.  Have eczema.  Are dehydrated.  Have excess hair in the ear canal.  What are the signs or symptoms? Symptoms of this condition include:  Reduced or muffled hearing.  A feeling of fullness in the ear or feeling that the ear is plugged.  Fluid coming from the ear.  Ear pain.  Ear itch.  Ringing in the  ear.  Coughing.  An obvious piece of earwax that can be seen inside the ear canal.  How is this diagnosed? This condition may be diagnosed based on:  Your symptoms.  Your medical history.  An ear exam. During the exam, your health care provider will look into your ear with an instrument called an otoscope.  You may have tests, including a hearing test. How is this treated? This condition may be treated by:  Using ear drops to soften the earwax.  Having the earwax removed by a health care provider. The health care provider may: ? Flush the ear with water. ? Use an instrument that has a loop on the end (curette). ? Use a suction device.  Surgery to remove the wax buildup. This may be done in severe cases.  Follow these instructions at home:  Take over-the-counter and prescription medicines only as told by your health care provider.  Do not put any objects, including cotton swabs, into your ear. You can clean the opening of your ear canal with a washcloth or facial tissue.  Follow instructions from your health care provider about cleaning your ears. Do not over-clean your ears.  Drink enough fluid to keep your urine clear or pale yellow. This will help to thin the  earwax.  Keep all follow-up visits as told by your health care provider. If earwax builds up in your ears often or if you use hearing aids, consider seeing your health care provider for routine, preventive ear cleanings. Ask your health care provider how often you should schedule your cleanings.  If you have hearing aids, clean them according to instructions from the manufacturer and your health care provider. Contact a health care provider if:  You have ear pain.  You develop a fever.  You have blood, pus, or other fluid coming from your ear.  You have hearing loss.  You have ringing in your ears that does not go away.  Your symptoms do not improve with treatment.  You feel like the room is spinning  (vertigo). Summary  Earwax can build up in the ear and cause discomfort or hearing loss.  The most common symptoms of this condition include reduced or muffled hearing and a feeling of fullness in the ear or feeling that the ear is plugged.  This condition may be diagnosed based on your symptoms, your medical history, and an ear exam.  This condition may be treated by using ear drops to soften the earwax or by having the earwax removed by a health care provider.  Do not put any objects, including cotton swabs, into your ear. You can clean the opening of your ear canal with a washcloth or facial tissue. This information is not intended to replace advice given to you by your health care provider. Make sure you discuss any questions you have with your health care provider. Document Released: 10/19/2004 Document Revised: 11/22/2016 Document Reviewed: 11/22/2016 Elsevier Interactive Patient Education  [employer].

## 2018-08-15 NOTE — Progress Notes (Signed)
Patient ID: Curtis Price., male   DOB: Feb 27, 1946, 72 y.o.   MRN: [id number]  MEDICARE ANNUAL WELLNESS VISIT AND OV  Assessment:   Encounter for Medicare annual wellness exam  Essential hypertension - continue medications, DASH diet, exercise and monitor at home. Call if greater than 130/80.   Gastroesophageal reflux disease, esophagitis presence not specified Continue PPI/H2 blocker, diet discussed  Testosterone Deficiency Hypogonadism- continue replacement therapy, check testosterone levels as needed.   Benign prostatic hyperplasia with urinary frequency Continue meds, monitor  Erectile dysfunction, unspecified erectile dysfunction type Weight loss, continue meds PRN  Fibromyalgia Continue medications  Mixed hyperlipidemia -continue medications, check lipids, decrease fatty foods, increase activity.  -     Lipid panel at 3 month visits  Vitamin D deficiency  Medication management -     Magnesium routinely PRN  Elevated serum immunoglobulin free light chains -     CBC with Differential/Platelet at 3 month OV  Hx of adenomatous polyp of colon Due 2021  Other abnormal glucose Discussed general issues about diabetes pathophysiology and management., Educational material distributed., Suggested low cholesterol diet., Encouraged aerobic exercise., Discussed foot care., Reminded to get yearly retinal exam. -     Hemoglobin A1c  Obesity (BMI 30-34.9) - long discussion about weight loss, diet, and exercise  Hard of hearing Scant cerumen present today, irrigated to ensure hearing test may be completed without complication. - stop using Qtips, irrigation used in the office without complications, use OTC drops/oil at home to prevent reoccurence   Future Appointments  Date Time Provider [place]  11/13/2018  9:00 AM Vicie Mutters, PA-C [facility] None  02/25/2019 11:00 AM [name], MD [facility] None     Plan:   During the course of the visit the  patient was educated and counseled about appropriate screening and preventive services including:    Pneumococcal vaccine   Influenza vaccine  Td vaccine  Screening electrocardiogram  Bone densitometry screening  Colorectal cancer screening  Diabetes screening  Glaucoma screening  Nutrition counseling   Advanced directives: requested  Subjective:  Curtis Price.  presents for Medicare Annual Wellness Visit and OV.   He is here today for ear irrigation due to reports of obstructed EAC on left limiting hearing test attempts for evaluation of hearing aid. He is somewhat HOH.   Patient is treated for HTN since 1994 & BP has been controlled at home. BP: 126/80 . Patient had a negative Myoview & Normal Heart Cath in 2011. Patient has had no complaints of any cardiac type chest pain, palpitations, dyspnea/orthopnea/PND, dizziness, claudication, or dependent edema.  BMI is Body mass index is 30.33 kg/m., he has not been working on diet and exercise. Wt Readings from Last 3 Encounters:  08/15/18 211 lb 6.4 oz (95.9 kg)  08/09/18 208 lb 3.2 oz (94.4 kg)  05/06/18 211 lb (95.7 kg)   He recently saw Dr. Melford Aase for routine OV/management:   Hyperlipidemia is controlled with diet & meds. Patient denies myalgias or other med SE's. Last Lipids were not at goal - Lab Results  Component Value Date   CHOL 198 08/09/2018   HDL 50 08/09/2018   LDLCALC 116 (H) 08/09/2018   LDLDIRECT 151.0 02/10/2010   TRIG 197 (H) 08/09/2018   CHOLHDL 4.0 08/09/2018   Also, the patient is screened for PreDiabetes and has had no symptoms of reactive hypoglycemia, diabetic polys, paresthesias or visual blurring.  Lab Results  Component Value Date   HGBA1C 5.1 08/09/2018  Further, the patient also has history of Vitamin D Deficiency Lab Results  Component Value Date   VD25OH 22 08/09/2018   Lab Results  Component Value Date   GFRNONAA 61 08/09/2018   He has a history of testosterone  deficiency and is on testosterone replacement, the gel. He states that the testosterone helps with his energy, libido, muscle mass. Lab Results  Component Value Date   TESTOSTERONE 688 08/09/2018     Medication Review: Current Outpatient Medications on File Prior to Visit  Medication Sig  . aspirin (ASPIR-LOW) 81 MG EC tablet Take 81 mg by mouth daily.    Marland Kitchen buPROPion (WELLBUTRIN XL) 300 MG 24 hr tablet TAKE 1 TABLET DAILY FOR MOOD  . cetirizine (ZYRTEC) 10 MG tablet Take 1 tablet (10 mg total) by mouth at bedtime.  Marland Kitchen desoximetasone (TOPICORT) 0.25 % cream Apply 1 application topically 2 (two) times daily.  . diclofenac sodium (VOLTAREN) 1 % GEL Apply 2 g topically 4 (four) times daily.  . fish oil-omega-3 fatty acids 1000 MG capsule Take 1 capsule by mouth 2 (two) times daily.    . hydrochlorothiazide (HYDRODIURIL) 25 MG tablet TAKE 1 TABLET BY MOUTH ONCE DAILY FOR BLOOD PRESSURE AND  FLUID  . losartan (COZAAR) 100 MG tablet TAKE 1 TABLET DAILY  . Magnesium 250 MG TABS Take 1 tablet by mouth 2 (two) times daily.    . metoCLOPramide (REGLAN) 10 MG tablet Take 1 tablet 4 x / day before meals & 30 min before hour of sleep for Acid Reflux  . Multiple Vitamins-Minerals (STRESS B COMPLEX/ZINC) TABS Take 1 tablet by mouth daily.    . pantoprazole (PROTONIX) 40 MG tablet Take 40 mg by mouth daily.  . rosuvastatin (CRESTOR) 40 MG tablet Take 1/2 to 1 tablet daily or as directed for Cholesterol  . Testosterone (ANDROGEL PUMP) 20.25 MG/ACT (1.62%) GEL Apply 4 pumps daily  - disp 6 bottles (90 day supply) with refills for 6 months  . valACYclovir (VALTREX) 1000 MG tablet    No current facility-administered medications on file prior to visit.     Current Problems (verified) Patient Active Problem List   Diagnosis Date Noted  . Obesity (BMI 30.0-34.9) 09/05/2015  . Other abnormal glucose 08/31/2015  . Erectile dysfunction 08/31/2015  . Elevated serum immunoglobulin free light chains 04/28/2014   . Medication management 11/19/2013  . Benign prostatic hyperplasia   . GERD   . Fibromyalgia   . Testosterone Deficiency   . Hyperlipidemia   . Vitamin D deficiency   . Essential hypertension 02/15/2010  . Hx of adenomatous polyp of colon 06/19/2007   Screening Tests Immunization History  Administered Date(s) Administered  . Influenza, High Dose Seasonal PF 08/13/2014, 08/31/2015  . Pneumococcal Conjugate-13 08/13/2014  . Pneumococcal Polysaccharide-23 07/12/2012  . Tdap 04/25/2009  . Zoster 07/27/2007   Preventative care: Last colonoscopy: 2016 Dr. Carlean Purl, due 2021  Tetanus: 2010 Influenza: 201, declines  Pneumonia: 2013 Prevnar: 2015 Shingles: 2008  Names of Other Physician/Practitioners you currently use: 1. [facility] here for primary care 2. [employer], glasses, eye doctor, last visit 09/2015 3. Dr Shanon Brow Spong,DDS, dentist, last visit 2019/every 6 months  Patient Care Team: [name], MD as PCP - General (Internal Medicine) Larey Dresser, MD as Consulting Physician (Cardiology) Sable Feil, MD as Consulting Physician (Gastroenterology) Heath Lark, MD as Consulting Physician (Hematology and Oncology) Bensimhon, Shaune Pascal, MD as Consulting Physician (Cardiology)   History reviewed: allergies, current  medications, past family history, past medical history, past social history, past surgical history and problem list  Allergies Allergies  Allergen Reactions  . Atenolol   . Atorvastatin   . Darvocet [Propoxyphene N-Acetaminophen]   . Paroxetine   . Pregabalin   . Propoxyphene Hcl     SURGICAL HISTORY He  has a past surgical history that includes Cardiac catheterization (02/2010); Heel spur surgery (1960); Tonsillectomy (1962); Finger surgery (2000); and BIH REPAIR (08/2005). FAMILY HISTORY His family history includes Aortic stenosis in his other; Aortic stenosis (age of onset: 77) in  his father; Breast cancer in his mother and other; Diabetes in his other; Hypertension in his mother and other; Other in his other. SOCIAL HISTORY He  reports that he has never smoked. He has never used smokeless tobacco. He reports that he drinks about 12.0 standard drinks of alcohol per week. He reports that he does not use drugs.  MEDICARE WELLNESS OBJECTIVES: Physical activity: Current Exercise Habits: The patient has a physically strenous job, but has no regular exercise apart from work.Tommi Rumps), Exercise limited by: None identified Cardiac risk factors: Cardiac Risk Factors include: advanced age (>53men, >35 women);hypertension;dyslipidemia;obesity (BMI >30kg/m2);male gender;family history of premature cardiovascular disease Depression/mood screen:   Depression screen [facility] 2/9 08/15/2018  Decreased Interest 0  Down, Depressed, Hopeless 0  PHQ - 2 Score 0    ADLs:  In your present state of health, do you have any difficulty performing the following activities: 08/15/2018 01/13/2018  Hearing? Y N  Comment gradual, pending hearing test -  Vision? N N  Difficulty concentrating or making decisions? N N  Walking or climbing stairs? N N  Dressing or bathing? N N  Doing errands, shopping? N N  Some recent data might be hidden     Cognitive Testing  Alert? Yes  Normal Appearance?Yes  Oriented to person? Yes  Place? Yes   Time? Yes  Recall of three objects?  Yes  Can perform simple calculations? Yes  Displays appropriate judgment?Yes  Can read the correct time from a watch face?Yes  EOL planning: Does Patient Have a Medical Advance Directive?: No Type of Advance Directive: Healthcare Power of Attorney, Living will [place] in Chart?: No - copy requested Would patient like information on creating a medical advance directive?: No - Patient declined  Objective:     Blood pressure 126/80, pulse 83, temperature 98.8 F (37.1 C), height 5\' 10"  (1.778 m),  weight 211 lb 6.4 oz (95.9 kg), SpO2 98 %.   General Appearance:  Alert  WD/WN, male  in no apparent distress. Eyes: PERRLA, EOMs nl, conjunctiva normal, normal fundi and vessels. Sinuses: + frontal/maxillary tenderness ENT/Mouth: EACs patent bilaterally. Small amount of hard wax present to wall of left canal, non-obstructive. TM  Nl. & Rt TM dull pink w/splayed reflex.  Nares clear without erythema, swelling, mucoid exudates. Oral hygiene is good. Without erythema, w/o exudate. Tongue normal, non-obstructing. Tonsils not swollen or erythematous. Mildly HOH.   Neck: Supple, thyroid normal. No bruits, nodes or JVD. Respiratory: Respiratory effort normal.  BS equal and clear bilateral without rales, rhonci, wheezing or stridor. Cardio: Heart sounds are normal with regular rate and rhythm and no murmurs, rubs or gallops. Peripheral pulses are normal and equal bilaterally without edema. No aortic or femoral bruits. Chest: symmetric with normal excursions and percussion.  Abdomen: Distended, firm, soft, with nl bowel sounds, + bulging retractable hernia just superior to well healed surgical scar from previous hernia. Nontender,  no guarding, rebound, hernias, masses, or organomegaly.  Lymphatics: Non tender without lymphadenopathy.  Musculoskeletal: Full ROM all peripheral extremities, joint stability, 5/5 strength, and normal gait. Skin: Warm and dry without rashes, lesions, cyanosis, clubbing or  ecchymosis.  Neuro: Cranial nerves intact, reflexes equal bilaterally. Normal muscle tone, no cerebellar symptoms. Sensation intact.  Pysch: Alert and oriented X 3 with normal affect, insight and judgment appropriate.   Medicare Attestation I have personally reviewed: The patient's medical and social history Their use of alcohol, tobacco or illicit drugs Their current medications and supplements The patient's functional ability including ADLs,fall risks, home safety risks, cognitive, and hearing and  visual impairment Diet and physical activities Evidence for depression or mood disorders  The patient's weight, height, BMI, and visual acuity have been recorded in the chart.  I have made referrals, counseling, and provided education to the patient based on review of the above and I have provided the patient with a written personalized care plan for preventive services.  Over 40 minutes of exam, counseling, chart review was performed.  Izora Ribas, NP   08/15/2018

## 2018-08-31 ENCOUNTER — Other Ambulatory Visit: Payer: Self-pay | Admitting: Internal Medicine

## 2018-10-12 ENCOUNTER — Other Ambulatory Visit: Payer: Self-pay | Admitting: Adult Health

## 2018-10-12 DIAGNOSIS — K219 Gastro-esophageal reflux disease without esophagitis: Secondary | ICD-10-CM

## 2018-11-11 NOTE — Progress Notes (Signed)
Patient ID: Curtis Price., male   DOB: 07-Aug-1946, 73 y.o.   MRN: [id number]  MEDICARE ANNUAL WELLNESS VISIT AND OV  Assessment:   Encounter for Medicare annual wellness exam 1 year Encouraged to increase walking now that he is not working anymore Has hearing aids but not wearing.   Essential hypertension - continue medications, DASH diet, exercise and monitor at home. Call if greater than 130/80.   Gastroesophageal reflux disease, esophagitis presence not specified Continue PPI/H2 blocker, diet discussed  Testosterone Deficiency Hypogonadism- continue replacement therapy, check testosterone levels as needed.   Benign prostatic hyperplasia with urinary frequency Continue meds, monitor  Erectile dysfunction, unspecified erectile dysfunction type Weight loss, continue meds PRN  Fibromyalgia Continue medications  Mixed hyperlipidemia -continue medications, check lipids, decrease fatty foods, increase activity.  -     Lipid panel   Vitamin D deficiency  Medication management -     Magnesium routinely PRN  Elevated serum immunoglobulin free light chains -     CBC with Differential/Platelet   Hx of adenomatous polyp of colon Due 2021  Other abnormal glucose Discussed general issues about diabetes pathophysiology and management., Educational material distributed., Suggested low cholesterol diet., Encouraged aerobic exercise., Discussed foot care., Reminded to get yearly retinal exam. -     Hemoglobin A1c  Obesity (BMI 30-34.9) - long discussion about weight loss, diet, and exercise     Future Appointments  Date Time Provider [place]  02/25/2019 11:00 AM [name], MD [facility] None     Plan:   During the course of the visit the patient was educated and counseled about appropriate screening and preventive services including:    Pneumococcal vaccine   Influenza vaccine  Td vaccine  Screening electrocardiogram  Bone densitometry  screening  Colorectal cancer screening  Diabetes screening  Glaucoma screening  Nutrition counseling   Advanced directives: requested  Subjective:  Curtis Price.  presents for Medicare Annual Wellness Visit and OV.   He has hearing aids x 2 months, does not wear all the time. He has stopped working, welding, some sadness with this but he has things to keep him busy at this time.    Patient is treated for HTN since 1994 & BP has been controlled at home. BP: 128/88 Patient had a negative Myoview & Normal Heart Cath in 2011. Patient has had no complaints of any cardiac type chest pain, palpitations, dyspnea/orthopnea/PND, dizziness, claudication, or dependent edema.  BMI is Body mass index is 29.27 kg/m., he has not been working on diet and exercise. Wt Readings from Last 3 Encounters:  11/13/18 204 lb (92.5 kg)  08/15/18 211 lb 6.4 oz (95.9 kg)  08/09/18 208 lb 3.2 oz (94.4 kg)   Hyperlipidemia is controlled with diet & meds. Patient denies myalgias or other med SE's. Last Lipids were not at goal - Lab Results  Component Value Date   CHOL 198 08/09/2018   HDL 50 08/09/2018   LDLCALC 116 (H) 08/09/2018   LDLDIRECT 151.0 02/10/2010   TRIG 197 (H) 08/09/2018   CHOLHDL 4.0 08/09/2018   Also, the patient is screened for PreDiabetes and has had no symptoms of reactive hypoglycemia, diabetic polys, paresthesias or visual blurring.  Lab Results  Component Value Date   HGBA1C 5.1 08/09/2018    Further, the patient also has history of Vitamin D Deficiency Lab Results  Component Value Date   VD25OH 58 08/09/2018   Lab Results  Component Value Date   GFRNONAA 61 08/09/2018  He has a history of testosterone deficiency and is on testosterone replacement, the gel. He states that the testosterone helps with his energy, libido, muscle mass. Lab Results  Component Value Date   TESTOSTERONE 688 08/09/2018     Medication Review:  Current Outpatient Medications (Endocrine &  Metabolic):  Marland Kitchen  Testosterone (ANDROGEL PUMP) 20.25 MG/ACT (1.62%) GEL, Apply 4 pumps daily  - disp 6 bottles (90 day supply) with refills for 6 months  Current Outpatient Medications (Cardiovascular):  .  hydrochlorothiazide (HYDRODIURIL) 25 MG tablet, TAKE 1 TABLET BY MOUTH ONCE DAILY FOR BLOOD PRESSURE AND  FLUID .  losartan (COZAAR) 100 MG tablet, TAKE 1 TABLET DAILY .  rosuvastatin (CRESTOR) 40 MG tablet, Take 1/2 to 1 tablet daily or as directed for Cholesterol  Current Outpatient Medications (Respiratory):  .  cetirizine (ZYRTEC) 10 MG tablet, Take 1 tablet (10 mg total) by mouth at bedtime.  Current Outpatient Medications (Analgesics):  .  aspirin (ASPIR-LOW) 81 MG EC tablet, Take 81 mg by mouth daily.     Current Outpatient Medications (Other):  Marland Kitchen  buPROPion (WELLBUTRIN XL) 300 MG 24 hr tablet, TAKE 1 TABLET DAILY FOR MOOD .  desoximetasone (TOPICORT) 0.25 % cream, Apply 1 application topically 2 (two) times daily. .  diclofenac sodium (VOLTAREN) 1 % GEL, Apply 2 g topically 4 (four) times daily. .  fish oil-omega-3 fatty acids 1000 MG capsule, Take 1 capsule by mouth 2 (two) times daily.   .  Magnesium 250 MG TABS, Take 1 tablet by mouth 2 (two) times daily.   .  metoCLOPramide (REGLAN) 10 MG tablet, Take 1 tablet 4 x / day before meals & 30 min before hour of sleep for Acid Reflux .  Multiple Vitamins-Minerals (STRESS B COMPLEX/ZINC) TABS, Take 1 tablet by mouth daily.   .  pantoprazole (PROTONIX) 40 MG tablet, TAKE 1 TABLET DAILY FOR ACID REFLUX .  ranitidine (ZANTAC) 300 MG tablet, TAKE 1 TO 2 TABLETS DAILY FOR INDIGESTION AND HEARTBURN .  valACYclovir (VALTREX) 1000 MG tablet,   Current Problems (verified) Patient Active Problem List   Diagnosis Date Noted  . Obesity (BMI 30.0-34.9) 09/05/2015  . Other abnormal glucose 08/31/2015  . Erectile dysfunction 08/31/2015  . 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 Medication management 11/19/2013  .  Benign prostatic hyperplasia   . GERD   . Fibromyalgia   . Testosterone Deficiency   . Hyperlipidemia   . Vitamin D deficiency   . Essential hypertension 02/15/2010  . Hx of adenomatous polyp of colon 06/19/2007   Screening Tests Immunization History  Administered Date(s) Administered  . Influenza, High Dose Seasonal PF 08/13/2014, 08/31/2015  . Pneumococcal Conjugate-13 08/13/2014  . Pneumococcal Polysaccharide-23 07/12/2012  . Tdap 04/25/2009  . Zoster 07/27/2007   Preventative care: Last colonoscopy: 2016 Dr. Carlean Purl, due 2021  Tetanus: 2010 Influenza: 2016, declines  Pneumonia: 2013 Prevnar: 2015 Shingles: 2008  Names of Other Physician/Practitioners you currently use: 1. [facility] here for primary care 2. [employer], glasses, eye doctor, last visit 09/2015 3. Dr Shanon Brow Spong,DDS, dentist, last visit 2019/every 6 months  Patient Care Team: [name], MD as PCP - General (Internal Medicine) Larey Dresser, MD as Consulting Physician (Cardiology) Sable Feil, MD as Consulting Physician (Gastroenterology) Heath Lark, MD as Consulting Physician (Hematology and Oncology) Bensimhon, Shaune Pascal, MD as Consulting Physician (Cardiology)  Allergies Allergies  Allergen Reactions  . Atenolol   . Atorvastatin   .  Darvocet [Propoxyphene N-Acetaminophen]   . Paroxetine   . Pregabalin   . Propoxyphene Hcl     SURGICAL HISTORY He  has a past surgical history that includes Cardiac catheterization (02/2010); Heel spur surgery (1960); Tonsillectomy (1962); Finger surgery (2000); and BIH REPAIR (08/2005). FAMILY HISTORY His family history includes Aortic stenosis in an other family member; Aortic stenosis (age of onset: 29) in his father; Breast cancer in his mother and another family member; Diabetes in an other family member; Hypertension in his mother and another family member; Other in an other family  member. SOCIAL HISTORY He  reports that he has never smoked. He has never used smokeless tobacco. He reports current alcohol use of about 12.0 standard drinks of alcohol per week. He reports that he does not use drugs.  MEDICARE WELLNESS OBJECTIVES: Physical activity: Current Exercise Habits: The patient does not participate in regular exercise at present(is no longer working) Cardiac risk factors: Cardiac Risk Factors include: advanced age (>46men, >108 women);dyslipidemia;diabetes mellitus;hypertension;male gender;sedentary lifestyle Depression/mood screen:   Depression screen [facility] 2/9 11/13/2018  Decreased Interest 0  Down, Depressed, Hopeless 0  PHQ - 2 Score 0    ADLs:  In your present state of health, do you have any difficulty performing the following activities: 11/13/2018 08/15/2018  Hearing? Tempie Donning  Comment has hearing aids now gradual, pending hearing test  Vision? N N  Difficulty concentrating or making decisions? N N  Walking or climbing stairs? N N  Dressing or bathing? N N  Doing errands, shopping? N N  Some recent data might be hidden     Cognitive Testing  Alert? Yes  Normal Appearance?Yes  Oriented to person? Yes  Place? Yes   Time? Yes  Recall of three objects?  Yes  Can perform simple calculations? Yes  Displays appropriate judgment?Yes  Can read the correct time from a watch face?Yes  EOL planning: Does Patient Have a Medical Advance Directive?: No Would patient like information on creating a medical advance directive?: No - Patient declined(Has papers at home) No, received paper work last OV in Nov  Objective:     Blood pressure 128/88, pulse 85, temperature (!) 97.4 F (36.3 C), height 5\' 10"  (1.778 m), weight 204 lb (92.5 kg), SpO2 97 %.   General Appearance:  Alert  WD/WN, male  in no apparent distress. Eyes: PERRLA, EOMs nl, conjunctiva normal, normal fundi and vessels. Sinuses: + frontal/maxillary tenderness ENT/Mouth: EACs patent bilaterally.Marland KitchenMarland KitchenNares  clear without erythema, swelling, mucoid exudates. Oral hygiene is good. Without erythema, w/o exudate. Tongue normal, non-obstructing. Tonsils not swollen or erythematous. Mildly HOH.   Neck: Supple, thyroid normal. No bruits, nodes or JVD. Respiratory: Respiratory effort normal.  BS equal and clear bilateral without rales, rhonci, wheezing or stridor. Cardio: Heart sounds are normal with regular rate and rhythm and no murmurs, rubs or gallops. Peripheral pulses are normal and equal bilaterally without edema. No aortic or femoral bruits. Chest: symmetric with normal excursions and percussion.  Abdomen: Distended, obese, soft, with nl bowel sounds, + bulging retractable hernia just superior to well healed surgical scar from previous hernia. Nontender, no guarding, rebound, hernias, masses, or organomegaly.  Lymphatics: Non tender without lymphadenopathy.  Musculoskeletal: Full ROM all peripheral extremities, joint stability, 5/5 strength, and normal gait. Skin: Warm and dry without rashes, lesions, cyanosis, clubbing or  ecchymosis.  Neuro: Cranial nerves intact, reflexes equal bilaterally. Normal muscle tone, no cerebellar symptoms. Sensation intact.  Pysch: Alert and oriented X 3 with normal affect, insight  and judgment appropriate.   Medicare Attestation I have personally reviewed: The patient's medical and social history Their use of alcohol, tobacco or illicit drugs Their current medications and supplements The patient's functional ability including ADLs,fall risks, home safety risks, cognitive, and hearing and visual impairment Diet and physical activities Evidence for depression or mood disorders  The patient's weight, height, BMI, and visual acuity have been recorded in the chart.  I have made referrals, counseling, and provided education to the patient based on review of the above and I have provided the patient with a written personalized care plan for preventive services.  Over 40  minutes of exam, counseling, chart review was performed.  Vicie Mutters, PA-C   11/13/2018

## 2018-11-13 ENCOUNTER — Ambulatory Visit (INDEPENDENT_AMBULATORY_CARE_PROVIDER_SITE_OTHER): Payer: Medicare Other | Admitting: Physician Assistant

## 2018-11-13 ENCOUNTER — Encounter: Payer: Self-pay | Admitting: Physician Assistant

## 2018-11-13 VITALS — BP 128/88 | HR 85 | Temp 97.4°F | Ht 70.0 in | Wt 204.0 lb

## 2018-11-13 DIAGNOSIS — Z0001 Encounter for general adult medical examination with abnormal findings: Secondary | ICD-10-CM | POA: Diagnosis not present

## 2018-11-13 DIAGNOSIS — R6889 Other general symptoms and signs: Secondary | ICD-10-CM | POA: Diagnosis not present

## 2018-11-13 DIAGNOSIS — E559 Vitamin D deficiency, unspecified: Secondary | ICD-10-CM | POA: Diagnosis not present

## 2018-11-13 DIAGNOSIS — Z79899 Other long term (current) drug therapy: Secondary | ICD-10-CM | POA: Diagnosis not present

## 2018-11-13 DIAGNOSIS — Z0000 Encounter for general adult medical examination without abnormal findings: Secondary | ICD-10-CM

## 2018-11-13 DIAGNOSIS — R7309 Other abnormal glucose: Secondary | ICD-10-CM

## 2018-11-13 DIAGNOSIS — K219 Gastro-esophageal reflux disease without esophagitis: Secondary | ICD-10-CM

## 2018-11-13 DIAGNOSIS — N529 Male erectile dysfunction, unspecified: Secondary | ICD-10-CM

## 2018-11-13 DIAGNOSIS — Z86010 Personal history of colonic polyps: Secondary | ICD-10-CM

## 2018-11-13 DIAGNOSIS — I10 Essential (primary) hypertension: Secondary | ICD-10-CM

## 2018-11-13 DIAGNOSIS — E782 Mixed hyperlipidemia: Secondary | ICD-10-CM

## 2018-11-13 DIAGNOSIS — M797 Fibromyalgia: Secondary | ICD-10-CM

## 2018-11-13 DIAGNOSIS — E291 Testicular hypofunction: Secondary | ICD-10-CM

## 2018-11-13 DIAGNOSIS — R768 Other specified abnormal immunological findings in serum: Secondary | ICD-10-CM | POA: Diagnosis not present

## 2018-11-13 DIAGNOSIS — E669 Obesity, unspecified: Secondary | ICD-10-CM

## 2018-11-13 DIAGNOSIS — R350 Frequency of micturition: Secondary | ICD-10-CM

## 2018-11-13 DIAGNOSIS — N401 Enlarged prostate with lower urinary tract symptoms: Secondary | ICD-10-CM

## 2018-11-14 LAB — CBC WITH DIFFERENTIAL/PLATELET
Absolute Monocytes: 605 cells/uL (ref 200–950)
Basophils Absolute: 40 cells/uL (ref 0–200)
Basophils Relative: 0.8 %
Eosinophils Absolute: 180 cells/uL (ref 15–500)
Eosinophils Relative: 3.6 %
HCT: 52.7 % — ABNORMAL HIGH (ref 38.5–50.0)
Hemoglobin: 18.9 g/dL — ABNORMAL HIGH (ref 13.2–17.1)
Lymphs Abs: 1125 cells/uL (ref 850–3900)
MCH: 33.2 pg — ABNORMAL HIGH (ref 27.0–33.0)
MCHC: 35.9 g/dL (ref 32.0–36.0)
MCV: 92.5 fL (ref 80.0–100.0)
MPV: 10.5 fL (ref 7.5–12.5)
Monocytes Relative: 12.1 %
Neutro Abs: 3050 cells/uL (ref 1500–7800)
Neutrophils Relative %: 61 %
Platelets: 176 10*3/uL (ref 140–400)
RBC: 5.7 10*6/uL (ref 4.20–5.80)
RDW: 12.5 % (ref 11.0–15.0)
Total Lymphocyte: 22.5 %
WBC: 5 10*3/uL (ref 3.8–10.8)

## 2018-11-14 LAB — COMPLETE METABOLIC PANEL WITH GFR
AG Ratio: 1.9 (calc) (ref 1.0–2.5)
ALT: 24 U/L (ref 9–46)
AST: 17 U/L (ref 10–35)
Albumin: 4.2 g/dL (ref 3.6–5.1)
Alkaline phosphatase (APISO): 48 U/L (ref 35–144)
BUN: 14 mg/dL (ref 7–25)
CO2: 28 mmol/L (ref 20–32)
Calcium: 9.4 mg/dL (ref 8.6–10.3)
Chloride: 102 mmol/L (ref 98–110)
Creat: 1.12 mg/dL (ref 0.70–1.18)
GFR, Est African American: 76 mL/min/{1.73_m2} (ref >=60)
GFR, Est Non African American: 66 mL/min/{1.73_m2} (ref >=60)
Globulin: 2.2 g/dL (calc) (ref 1.9–3.7)
Glucose, Bld: 110 mg/dL — ABNORMAL HIGH (ref 65–99)
Potassium: 4.3 mmol/L (ref 3.5–5.3)
Sodium: 140 mmol/L (ref 135–146)
Total Bilirubin: 0.8 mg/dL (ref 0.2–1.2)
Total Protein: 6.4 g/dL (ref 6.1–8.1)

## 2018-11-14 LAB — LIPID PANEL
Cholesterol: 201 mg/dL — ABNORMAL HIGH (ref <200)
HDL: 49 mg/dL (ref >=40)
LDL Cholesterol (Calc): 123 mg/dL (calc) — ABNORMAL HIGH
Non-HDL Cholesterol (Calc): 152 mg/dL (calc) — ABNORMAL HIGH (ref <130)
Total CHOL/HDL Ratio: 4.1 (calc) (ref <5.0)
Triglycerides: 175 mg/dL — ABNORMAL HIGH (ref <150)

## 2018-11-14 LAB — TSH: TSH: 2.24 mIU/L (ref 0.40–4.50)

## 2018-12-14 ENCOUNTER — Other Ambulatory Visit: Payer: Self-pay | Admitting: Internal Medicine

## 2018-12-14 DIAGNOSIS — I10 Essential (primary) hypertension: Secondary | ICD-10-CM

## 2019-01-13 ENCOUNTER — Other Ambulatory Visit: Payer: Self-pay | Admitting: Internal Medicine

## 2019-01-13 DIAGNOSIS — K219 Gastro-esophageal reflux disease without esophagitis: Secondary | ICD-10-CM

## 2019-01-28 ENCOUNTER — Other Ambulatory Visit: Payer: Self-pay | Admitting: Internal Medicine

## 2019-01-28 DIAGNOSIS — E291 Testicular hypofunction: Secondary | ICD-10-CM

## 2019-01-28 MED ORDER — TESTOSTERONE 20.25 MG/ACT (1.62%) TD GEL: TRANSDERMAL | 1 refills | Status: DC

## 2019-02-07 ENCOUNTER — Encounter: Payer: Self-pay | Admitting: Internal Medicine

## 2019-02-14 IMAGING — CR DG CHEST 2V
2 series · 2 of 2 positions shown · non-contrast
Comparison: 03/04/2015

CLINICAL DATA: Cough for 6 months. Right-sided chest pain for
several weeks. Hypertension.

EXAM:
CHEST  2 VIEW

[chest pa]
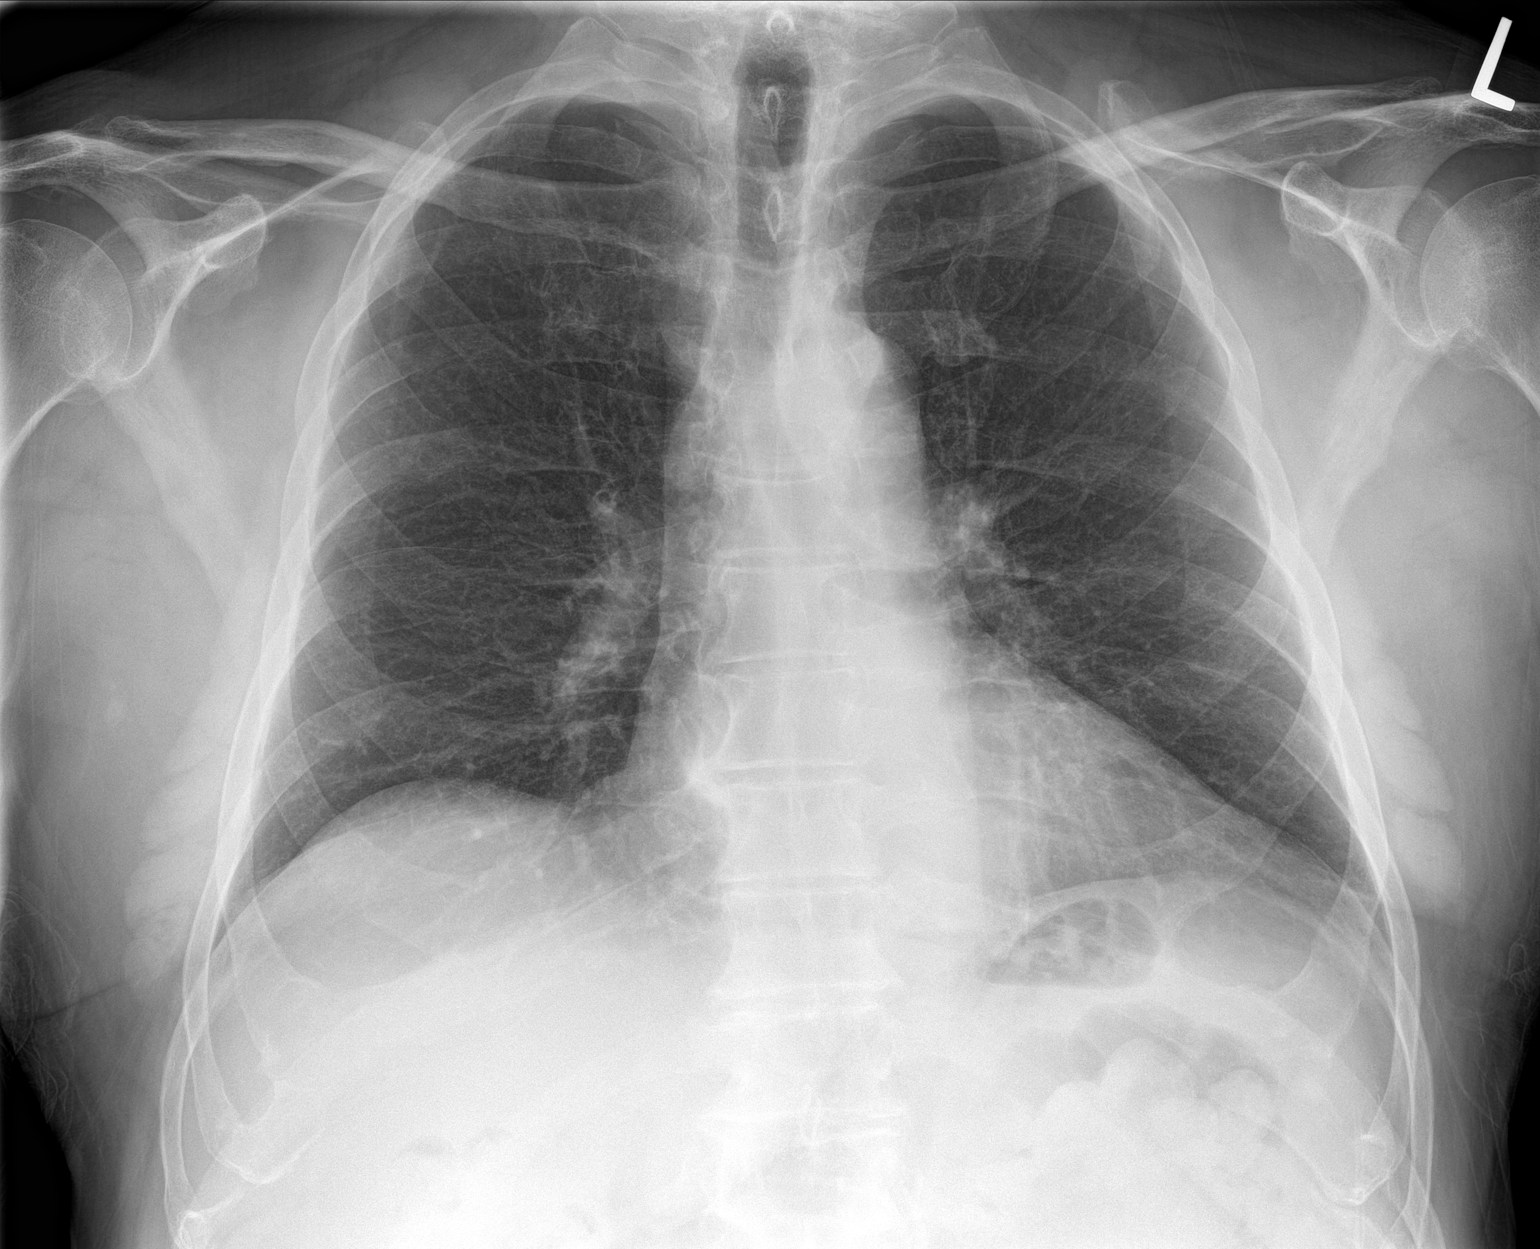

[chest lat]
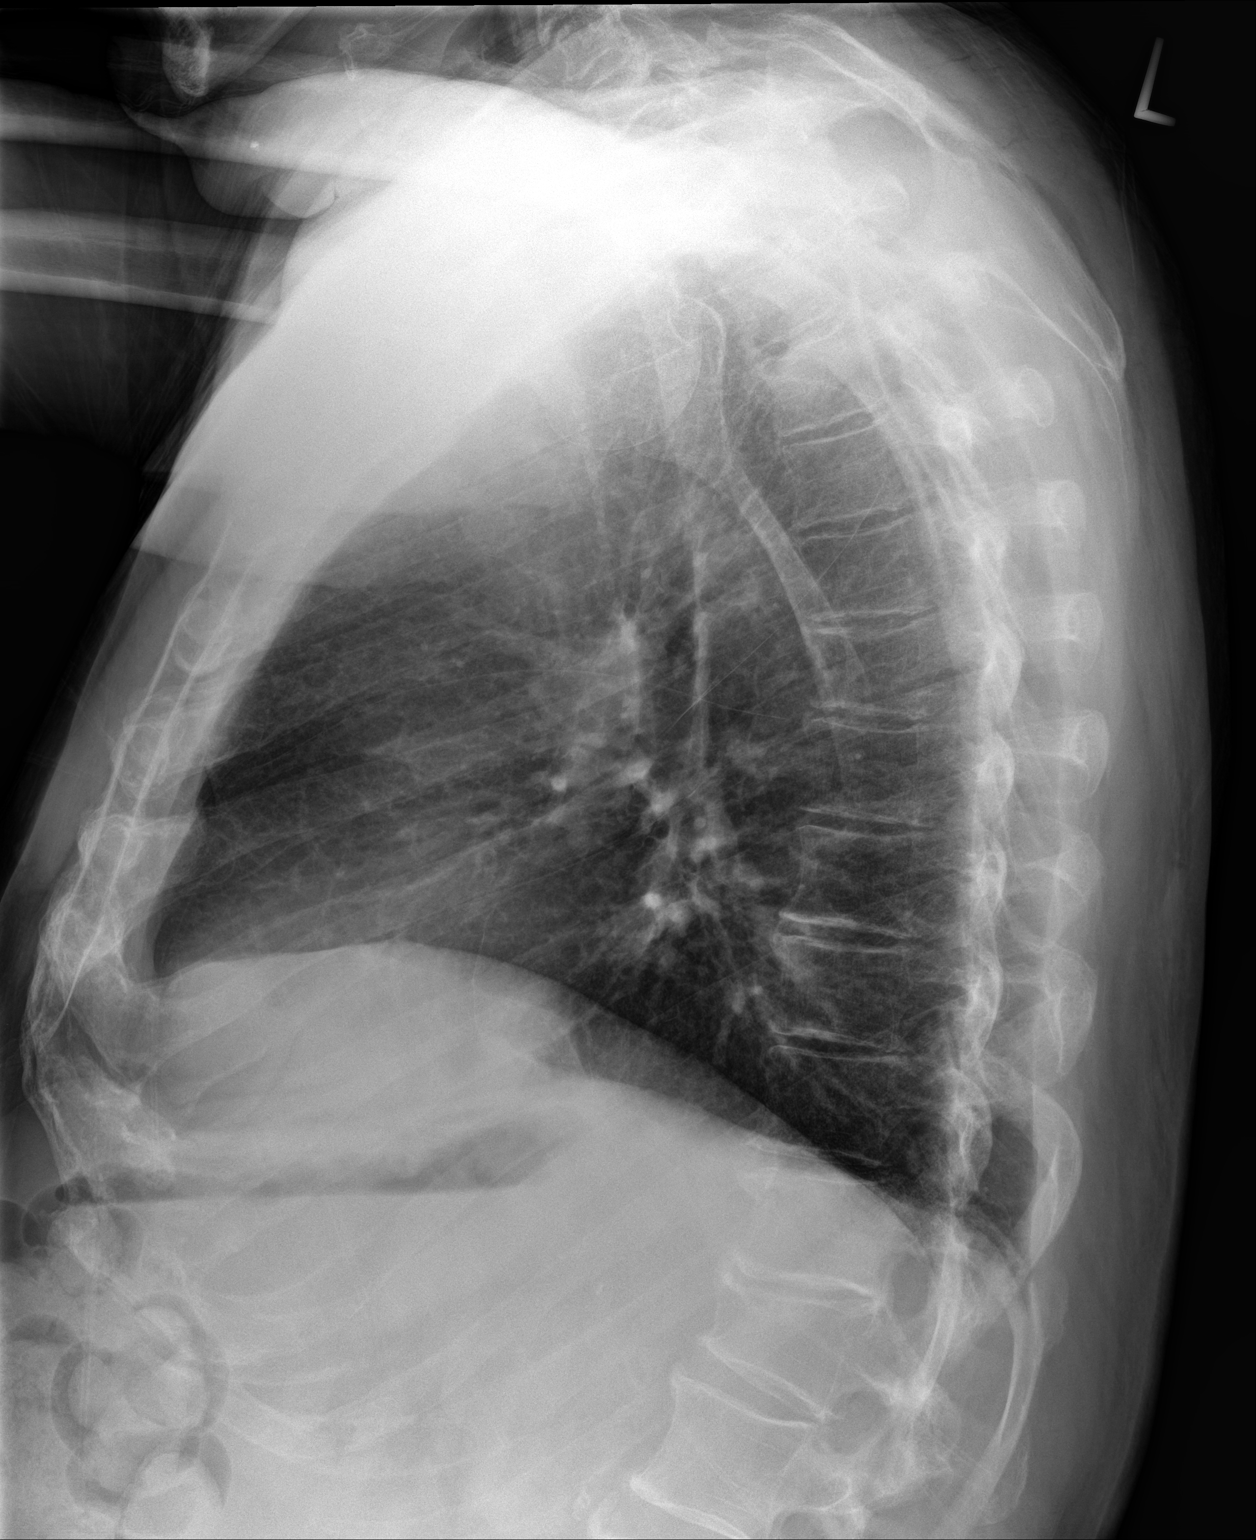

[2 of 2 positions shown; findings below may reference images not displayed]

FINDINGS: The heart size and mediastinal contours are within normal limits.
Both lungs are clear. Old compression fracture deformity of the L1
vertebral body is unchanged.
IMPRESSION: Stable exam.  No active cardiopulmonary disease.

## 2019-02-24 ENCOUNTER — Encounter: Payer: Self-pay | Admitting: Internal Medicine

## 2019-02-24 NOTE — Patient Instructions (Signed)
- Vit D       and      Vit C 500 mg   2 x /day are recommended to help protect  against the Covid-19 and other Corona viruses.   - Also it's recommended to take  Zinc 50 mg to help protect against the Covid_19   And best place to get  is on [email or web address]  and don't pay more than 6-8 cents /pill !   ================================ Coronavirus (COVID-19) Are you at risk?  Are you at risk for the Coronavirus (COVID-19)?  To be considered HIGH RISK for Coronavirus (COVID-19), you have to meet the following criteria:  . Traveled to [place], [place], [place], [place] or [place]; or in the [place] to [place], [place], [place]  . or [place]; and have fever, cough, and shortness of breath within the last 2 weeks of travel OR . Been in close contact with a person diagnosed with COVID-19 within the last 2 weeks and have  . fever, cough,and shortness of breath .  . IF YOU DO NOT MEET THESE CRITERIA, YOU ARE CONSIDERED LOW RISK FOR COVID-19.  What to do if you are HIGH RISK for COVID-19?  Marland Kitchen If you are having a medical emergency, call 911. . Seek medical care right away. Before you go to a doctor's office, urgent care or emergency department, .  call ahead and tell them about your recent travel, contact with someone diagnosed with COVID-19  .  and your symptoms.  . You should receive instructions from your physician's office regarding next steps of care.  . When you arrive at healthcare provider, tell the healthcare staff immediately you have returned from  . visiting [place], [place], [place], [place] or [place]; or traveled in the [place] to [place], [place],  . [place] or [place] in the last two weeks or you have been in close contact with a person diagnosed with  . COVID-19 in the last 2 weeks.   . Tell the health care staff about your symptoms: fever, cough and shortness of breath. . After you have been seen by a medical provider, you will be either: o Tested for  (COVID-19) and discharged home on quarantine except to seek medical care if  o symptoms worsen, and asked to  - Stay home and avoid contact with others until you get your results (4-5 days)  - Avoid travel on public transportation if possible (such as bus, train, or airplane) or o Sent to the Emergency Department by EMS for evaluation, COVID-19 testing  and  o possible admission depending on your condition and test results.  What to do if you are LOW RISK for COVID-19?  Reduce your risk of any infection by using the same precautions used for avoiding the common cold or flu:  Marland Kitchen Wash your hands often with soap and warm water for at least 20 seconds.  If soap and water are not readily available,  . use an alcohol-based hand sanitizer with at least 60% alcohol.  . If coughing or sneezing, cover your mouth and nose by coughing or sneezing into the elbow areas of your shirt or coat, .  into a tissue or into your sleeve (not your hands). . Avoid shaking hands with others and consider head nods or verbal greetings only. . Avoid touching your eyes, nose, or mouth with unwashed hands.  . Avoid close contact with people who are sick. . Avoid places or events with large numbers  of people in one location, like concerts or sporting events. . Carefully consider travel plans you have or are making. . If you are planning any travel outside or inside the [place], visit the CDC's Travelers' Health webpage for the latest health notices. . If you have some symptoms but not all symptoms, continue to monitor at home and seek medical attention  . if your symptoms worsen. . If you are having a medical emergency, call 911. >>>>>>>>>>>>>>>>>>>>>>>>>>>>>>>>>>>>>>>>>>>>>>>>>>>>>>> We Do NOT Approve of  [facility] [place] Soliciting Our Patients  To Do Home Visits  & We Do NOT Approve of [employer] > > > > > > > > > > > > > > > > > > > > > > > > > > > > > > > > > > >  > > > >   Preventive Care for  Adults  A healthy lifestyle and preventive care can promote health and wellness. Preventive health guidelines for men include the following key practices:  A routine yearly physical is a good way to check with your health care provider about your health and preventative screening. It is a chance to share any concerns and updates on your health and to receive a thorough exam.  Visit your dentist for a routine exam and preventative care every 6 months. Brush your teeth twice a day and floss once a day. Good oral hygiene prevents tooth decay and gum disease.  The frequency of eye exams is based on your age, health, family medical history, use of contact lenses, and other factors. Follow your health care provider's recommendations for frequency of eye exams.  Eat a healthy diet. Foods such as vegetables, fruits, whole grains, low-fat dairy products, and lean protein foods contain the nutrients you need without too many calories. Decrease your intake of foods high in solid fats, added sugars, and salt. Eat the right amount of calories for you. Get information about a proper diet from your health care provider, if necessary.  Regular physical exercise is one of the most important things you can do for your health. Most adults should get at least 150 minutes of moderate-intensity exercise (any activity that increases your heart rate and causes you to sweat) each week. In addition, most adults need muscle-strengthening exercises on 2 or more days a week.  Maintain a healthy weight. The body mass index (BMI) is a screening tool to identify possible weight problems. It provides an estimate of body fat based on height and weight. Your health care provider can find your BMI and can help you achieve or maintain a healthy weight. For adults 20 years and older:  A BMI below 18.5 is considered underweight.  A BMI of 18.5 to 24.9 is normal.  A BMI of 25 to 29.9 is considered overweight.  A BMI of 30 and above  is considered obese.  Maintain normal blood lipids and cholesterol levels by exercising and minimizing your intake of saturated fat. Eat a balanced diet with plenty of fruit and vegetables. Blood tests for lipids and cholesterol should begin at age 76 and be repeated every 5 years. If your lipid or cholesterol levels are high, you are over 50, or you are at high risk for heart disease, you may need your cholesterol levels checked more frequently. Ongoing high lipid and cholesterol levels should be treated with medicines if diet and exercise are not working.  If you smoke, find out from your health care provider how to quit. If  you do not use tobacco, do not start.  Lung cancer screening is recommended for adults aged 88-80 years who are at high risk for developing lung cancer because of a history of smoking. A yearly low-dose CT scan of the lungs is recommended for people who have at least a 30-pack-year history of smoking and are a current smoker or have quit within the past 15 years. A pack year of smoking is smoking an average of 1 pack of cigarettes a day for 1 year (for example: 1 pack a day for 30 years or 2 packs a day for 15 years). Yearly screening should continue until the smoker has stopped smoking for at least 15 years. Yearly screening should be stopped for people who develop a health problem that would prevent them from having lung cancer treatment.  If you choose to drink alcohol, do not have more than 2 drinks per day. One drink is considered to be 12 ounces (355 mL) of beer, 5 ounces (148 mL) of wine, or 1.5 ounces (44 mL) of liquor.  Avoid use of street drugs. Do not share needles with anyone. Ask for help if you need support or instructions about stopping the use of drugs.  High blood pressure causes heart disease and increases the risk of stroke. Your blood pressure should be checked at least every 1-2 years. Ongoing high blood pressure should be treated with medicines, if weight loss  and exercise are not effective.  If you are 57-5 years old, ask your health care provider if you should take aspirin to prevent heart disease.  Diabetes screening involves taking a blood sample to check your fasting blood sugar level. Testing should be considered at a younger age or be carried out more frequently if you are overweight and have at least 1 risk factor for diabetes.  Colorectal cancer can be detected and often prevented. Most routine colorectal cancer screening begins at the age of 76 and continues through age 39. However, your health care provider may recommend screening at an earlier age if you have risk factors for colon cancer. On a yearly basis, your health care provider may provide home test kits to check for hidden blood in the stool. Use of a small camera at the end of a tube to directly examine the colon (sigmoidoscopy or colonoscopy) can detect the earliest forms of colorectal cancer. Talk to your health care provider about this at age 30, when routine screening begins. Direct exam of the colon should be repeated every 5-10 years through age 48, unless early forms of precancerous polyps or small growths are found.  Hepatitis C blood testing is recommended for all people born from [birth year] through 1964 and any individual with known risks for hepatitis C.  Screening for abdominal aortic aneurysm (AAA)  by ultrasound is recommended for people who have history of high blood pressure or who are current or former smokers.  Healthy men should  receive prostate-specific antigen (PSA) blood tests as part of routine cancer screening. Talk with your health care provider about prostate cancer screening.  Testicular cancer screening is  recommended for adult males. Screening includes self-exam, a health care provider exam, and other screening tests. Consult with your health care provider about any symptoms you have or any concerns you have about testicular cancer.  Use sunscreen. Apply  sunscreen liberally and repeatedly throughout the day. You should seek shade when your shadow is shorter than you. Protect yourself by wearing long sleeves, pants, a wide-brimmed hat, and  sunglasses year round, whenever you are outdoors.  Once a month, do a whole-body skin exam, using a mirror to look at the skin on your back. Tell your health care provider about new moles, moles that have irregular borders, moles that are larger than a pencil eraser, or moles that have changed in shape or color.  Stay current with required vaccines (immunizations).  Influenza vaccine. All adults should be immunized every year.  Tetanus, diphtheria, and acellular pertussis (Td, Tdap) vaccine. An adult who has not previously received Tdap or who does not know his vaccine status should receive 1 dose of Tdap. This initial dose should be followed by tetanus and diphtheria toxoids (Td) booster doses every 10 years. Adults with an unknown or incomplete history of completing a 3-dose immunization series with Td-containing vaccines should begin or complete a primary immunization series including a Tdap dose. Adults should receive a Td booster every 10 years.  Zoster vaccine. One dose is recommended for adults aged 92 years or older unless certain conditions are present.    PREVNAR - Pneumococcal 13-valent conjugate (PCV13) vaccine. When indicated, a person who is uncertain of his immunization history and has no record of immunization should receive the PCV13 vaccine. An adult aged 58 years or older who has certain medical conditions and has not been previously immunized should receive 1 dose of PCV13 vaccine. This PCV13 should be followed with a dose of pneumococcal polysaccharide (PPSV23) vaccine. The PPSV23 vaccine dose should be obtained 1 or more year(s)after the dose of PCV13 vaccine. An adult aged 6 years or older who has certain medical conditions and previously received 1 or more doses of PPSV23 vaccine should  receive 1 dose of PCV13. The PCV13 vaccine dose should be obtained 1 or more years after the last PPSV23 vaccine dose.    PNEUMOVAX - Pneumococcal polysaccharide (PPSV23) vaccine. When PCV13 is also indicated, PCV13 should be obtained first. All adults aged 11 years and older should be immunized. An adult younger than age 86 years who has certain medical conditions should be immunized. Any person who resides in a nursing home or long-term care facility should be immunized. An adult smoker should be immunized. People with an immunocompromised condition and certain other conditions should receive both PCV13 and PPSV23 vaccines. People with human immunodeficiency virus (HIV) infection should be immunized as soon as possible after diagnosis. Immunization during chemotherapy or radiation therapy should be avoided. Routine use of PPSV23 vaccine is not recommended for American Indians, [place] Natives, or people younger than 65 years unless there are medical conditions that require PPSV23 vaccine. When indicated, people who have unknown immunization and have no record of immunization should receive PPSV23 vaccine. One-time revaccination 5 years after the first dose of PPSV23 is recommended for people aged 19-64 years who have chronic kidney failure, nephrotic syndrome, asplenia, or immunocompromised conditions. People who received 1-2 doses of PPSV23 before age 66 years should receive another dose of PPSV23 vaccine at age 91 years or later if at least 5 years have passed since the previous dose. Doses of PPSV23 are not needed for people immunized with PPSV23 at or after age 5 years.    Hepatitis A vaccine. Adults who wish to be protected from this disease, have certain high-risk conditions, work with hepatitis A-infected animals, work in hepatitis A research labs, or travel to or work in countries with a high rate of hepatitis A should be immunized. Adults who were previously unvaccinated and who anticipate close  contact with  an international adoptee during the first 60 days after arrival in the [place] from a country with a high rate of hepatitis A should be immunized.    Hepatitis B vaccine. Adults should be immunized if they wish to be protected from this disease, have certain high-risk conditions, may be exposed to blood or other infectious body fluids, are household contacts or sex partners of hepatitis B positive people, are clients or workers in certain care facilities, or travel to or work in countries with a high rate of hepatitis B.   Preventive Service / Frequency   Ages 53 and over  Blood pressure check.  Lipid and cholesterol check.  Lung cancer screening. / Every year if you are aged 85-80 years and have a 30-pack-year history of smoking and currently smoke or have quit within the past 15 years. Yearly screening is stopped once you have quit smoking for at least 15 years or develop a health problem that would prevent you from having lung cancer treatment.  Fecal occult blood test (FOBT) of stool. You may not have to do this test if you get a colonoscopy every 10 years.  Flexible sigmoidoscopy** or colonoscopy.** / Every 5 years for a flexible sigmoidoscopy or every 10 years for a colonoscopy beginning at age 44 and continuing until age 37.  Hepatitis C blood test.** / For all people born from [birth year] through 1964 and any individual with known risks for hepatitis C.  Abdominal aortic aneurysm (AAA) screening./ Screening current or former smokers or have Hypertension.  Skin self-exam. / Monthly.  Influenza vaccine. / Every year.  Tetanus, diphtheria, and acellular pertussis (Tdap/Td) vaccine.** / 1 dose of Td every 10 years.   Zoster vaccine.** / 1 dose for adults aged 38 years or older.         Pneumococcal 13-valent conjugate (PCV13) vaccine.    Pneumococcal polysaccharide (PPSV23) vaccine.     Hepatitis A vaccine.** / Consult your health care provider.  Hepatitis  B vaccine.** / Consult your health care provider. Screening for abdominal aortic aneurysm (AAA)  by ultrasound is recommended for people who have history of high blood pressure or who are current or former smokers. ++++++++++ Recommend Adult Low Dose Aspirin or  coated  Aspirin 81 mg daily  To reduce risk of Colon Cancer 40 %,  Skin Cancer 26 % ,  Malignant Melanoma 46%  and  Pancreatic cancer 60% ++++++++++++++++++++++ Vitamin D goal  is between 70-100.  Please make sure that you are taking your Vitamin D as directed.  It is very important as a natural anti-inflammatory  helping hair, skin, and nails, as well as reducing stroke and heart attack risk.  It helps your bones and helps with mood. It also decreases numerous cancer risks so please take it as directed.  Low Vit D is associated with a 200-300% higher risk for CANCER  and 200-300% higher risk for HEART   ATTACK  &  STROKE.   .....................................Marland Kitchen It is also associated with higher death rate at younger ages,  autoimmune diseases like Rheumatoid arthritis, Lupus, Multiple Sclerosis.    Also many other serious conditions, like depression, Alzheimer's Dementia, infertility, muscle aches, fatigue, fibromyalgia - just to name a few. ++++++++++++++++++++++ Recommend the book "The END of DIETING" by Dr Excell Seltzer  & the book "The END of DIABETES " by Dr Excell Seltzer At [email or web address] - get book & Audio CD's    Being diabetic has a  300% increased risk for heart attack,  stroke, cancer, and alzheimer- type vascular dementia. It is very important that you work harder with diet by avoiding all foods that are white. Avoid white rice (brown & wild rice is OK), white potatoes (sweetpotatoes in moderation is OK), White bread or wheat bread or anything made out of white flour like bagels, donuts, rolls, buns, biscuits, cakes, pastries, cookies, pizza crust, and pasta (made from white flour & egg whites) - vegetarian pasta or  spinach or wheat pasta is OK. Multigrain breads like Arnold's or [employer], or multigrain sandwich thins or flatbreads.  Diet, exercise and weight loss can reverse and cure diabetes in the early stages.  Diet, exercise and weight loss is very important in the control and prevention of complications of diabetes which affects every system in your body, ie. Brain - dementia/stroke, eyes - glaucoma/blindness, heart - heart attack/heart failure, kidneys - dialysis, stomach - gastric paralysis, intestines - malabsorption, nerves - severe painful neuritis, circulation - gangrene & loss of a leg(s), and finally cancer and Alzheimers.    I recommend avoid fried & greasy foods,  sweets/candy, white rice (brown or wild rice or Quinoa is OK), white potatoes (sweet potatoes are OK) - anything made from white flour - bagels, doughnuts, rolls, buns, biscuits,white and wheat breads, pizza crust and traditional pasta made of white flour & egg white(vegetarian pasta or spinach or wheat pasta is OK).  Multi-grain bread is OK - like multi-grain flat bread or sandwich thins. Avoid alcohol in excess. Exercise is also important.    Eat all the vegetables you want - avoid meat, especially red meat and dairy - especially cheese.  Cheese is the most concentrated form of trans-fats which is the worst thing to clog up our arteries. Veggie cheese is OK which can be found in the fresh produce section at [employer] or [employer] or [employer]  ++++++++++++++++++++++ DASH Eating Plan  DASH stands for "Dietary Approaches to Stop Hypertension."   The DASH eating plan is a healthy eating plan that has been shown to reduce high blood pressure (hypertension). Additional health benefits may include reducing the risk of type 2 diabetes mellitus, heart disease, and stroke. The DASH eating plan may also help with weight loss. WHAT DO I NEED TO KNOW ABOUT THE DASH EATING PLAN? For the DASH eating plan, you will follow these general  guidelines:  Choose foods with a percent daily value for sodium of less than 5% (as listed on the food label).  Use salt-free seasonings or herbs instead of table salt or sea salt.  Check with your health care provider or pharmacist before using salt substitutes.  Eat lower-sodium products, often labeled as "lower sodium" or "no salt added."  Eat fresh foods.  Eat more vegetables, fruits, and low-fat dairy products.  Choose whole grains. Look for the word "whole" as the first word in the ingredient list.  Choose fish   Limit sweets, desserts, sugars, and sugary drinks.  Choose heart-healthy fats.  Eat veggie cheese   Eat more home-cooked food and less restaurant, buffet, and fast food.  Limit fried foods.  Cook foods using methods other than frying.  Limit canned vegetables. If you do use them, rinse them well to decrease the sodium.  When eating at a restaurant, ask that your food be prepared with less salt, or no salt if possible.                      WHAT FOODS CAN I EAT?  Read Dr Fara Olden Fuhrman's books on The End of Dieting & The End of Diabetes  Grains Whole grain or whole wheat bread. Brown rice. Whole grain or whole wheat pasta. Quinoa, bulgur, and whole grain cereals. Low-sodium cereals. Corn or whole wheat flour tortillas. Whole grain cornbread. Whole grain crackers. Low-sodium crackers.  Vegetables Fresh or frozen vegetables (raw, steamed, roasted, or grilled). Low-sodium or reduced-sodium tomato and vegetable juices. Low-sodium or reduced-sodium tomato sauce and paste. Low-sodium or reduced-sodium canned vegetables.   Fruits All fresh, canned (in natural juice), or frozen fruits.  Protein Products  All fish and seafood.  Dried beans, peas, or lentils. Unsalted nuts and seeds. Unsalted canned beans.  Dairy Low-fat dairy products, such as skim or 1% milk, 2% or reduced-fat cheeses, low-fat ricotta or cottage cheese, or plain low-fat yogurt. Low-sodium or  reduced-sodium cheeses.  Fats and Oils Tub margarines without trans fats. Light or reduced-fat mayonnaise and salad dressings (reduced sodium). Avocado. Safflower, olive, or canola oils. Natural peanut or almond butter.  Other Unsalted popcorn and pretzels. The items listed above may not be a complete list of recommended foods or beverages. Contact your dietitian for more options.  ++++++++++++++++++++  WHAT FOODS ARE NOT RECOMMENDED? Grains/ White flour or wheat flour White bread. White pasta. White rice. Refined cornbread. Bagels and croissants. Crackers that contain trans fat.  Vegetables  Creamed or fried vegetables. Vegetables in a . Regular canned vegetables. Regular canned tomato sauce and paste. Regular tomato and vegetable juices.  Fruits Dried fruits. Canned fruit in light or heavy syrup. Fruit juice.  Meat and Other Protein Products Meat in general - RED meat & White meat.  Fatty cuts of meat. Ribs, chicken wings, all processed meats as bacon, sausage, bologna, salami, fatback, hot dogs, bratwurst and packaged luncheon meats.  Dairy Whole or 2% milk, cream, half-and-half, and cream cheese. Whole-fat or sweetened yogurt. Full-fat cheeses or blue cheese. Non-dairy creamers and whipped toppings. Processed cheese, cheese spreads, or cheese curds.  Condiments Onion and garlic salt, seasoned salt, table salt, and sea salt. Canned and packaged gravies. Worcestershire sauce. Tartar sauce. Barbecue sauce. Teriyaki sauce. Soy sauce, including reduced sodium. Steak sauce. Fish sauce. Oyster sauce. Cocktail sauce. Horseradish. Ketchup and mustard. Meat flavorings and tenderizers. Bouillon cubes. Hot sauce. Tabasco sauce. Marinades. Taco seasonings. Relishes.  Fats and Oils Butter, stick margarine, lard, shortening and bacon fat. Coconut, palm kernel, or palm oils. Regular salad dressings.  Pickles and olives. Salted popcorn and pretzels.  The items listed above may not be a  complete list of foods and beverages to avoid.

## 2019-02-24 NOTE — Progress Notes (Signed)
[facility] Curtis Price, M.D.     Curtis Price. Curtis Price, P.A.-C Curtis Comber, DNP _______________________________________________ [facility] [address] [place] [address] Telephone [phone] Telefax [phone] Annual Screening/Preventative Visit & Comprehensive Evaluation &  Examination     This very nice 73 y.o. MWM presents for a Screening /Preventative Visit & comprehensive evaluation and management of multiple medical co-morbidities.  Patient has been followed for HTN, HLD, PreDiabetes, Testosterone Deficiency and Vitamin D Deficiency. Patient has GERD controlled w/diet and his meds.      HTN predates since 12. He had a Negative Heart Cath in 2011. Patient's BP has been controlled at home and patient denies any cardiac symptoms as chest pain, palpitations, shortness of breath, dizziness or ankle swelling. Today's BP is at goal - 138/76.      Patient's hyperlipidemia is not controlled with diet and Rosuvastatin. Patient denies myalgias or other medication SE's. Last lipids were not at goal: Lab Results  Component Value Date   CHOL 201 (H) 11/13/2018   HDL 49 11/13/2018   LDLCALC 123 (H) 11/13/2018   LDLDIRECT 151.0 02/10/2010   TRIG 175 (H) 11/13/2018   CHOLHDL 4.1 11/13/2018      Patient Is overweight (BMI 29+) and is followed expectantly for prediabetes and patient denies reactive hypoglycemic symptoms, visual blurring, diabetic polys or paresthesias. Last A1c was Normal & at goal: Lab Results  Component Value Date   HGBA1C 5.1 08/09/2018      Patient has hx/o Testosterone Deficiency (2013) and has been on replacement therapy (gel) with an improved sense of well being & stamina.      Finally, patient has history of Vitamin D Deficiency ("38" / 2016) and last Vitamin D was near goal (70-100): Lab Results  Component Value Date   VD25OH 58 08/09/2018   Current Outpatient Medications on  File Prior to Visit  Medication Sig  . aspirin (ASPIR-LOW) 81 MG EC tablet Take 81 mg by mouth daily.    Marland Kitchen buPROPion (WELLBUTRIN XL) 300 MG 24 hr tablet TAKE 1 TABLET DAILY FOR MOOD  . cetirizine (ZYRTEC) 10 MG tablet Take 1 tablet (10 mg total) by mouth at bedtime.  Marland Kitchen desoximetasone (TOPICORT) 0.25 % cream Apply 1 application topically 2 (two) times daily.  . diclofenac sodium (VOLTAREN) 1 % GEL Apply 2 g topically 4 (four) times daily.  . fish oil-omega-3 fatty acids 1000 MG capsule Take 1 capsule by mouth 2 (two) times daily.    . hydrochlorothiazide (HYDRODIURIL) 25 MG tablet Take 1 tablet Daily for BP & Fluid Retention  . losartan (COZAAR) 100 MG tablet TAKE 1 TABLET DAILY  . Magnesium 250 MG TABS Take 1 tablet by mouth 2 (two) times daily.    . metoCLOPramide (REGLAN) 10 MG tablet Take 1 tablet 4 x / day before meals & 30 min before hour of sleep for Acid Reflux  . Multiple Vitamins-Minerals (STRESS B COMPLEX/ZINC) TABS Take 1 tablet by mouth daily.    . pantoprazole (PROTONIX) 40 MG tablet TAKE 1 TABLET DAILY FOR ACID REFLUX  . ranitidine (ZANTAC) 300 MG tablet TAKE 1 TO 2 TABLETS DAILY FOR INDIGESTION AND HEARTBURN  . rosuvastatin (CRESTOR) 40 MG tablet Take 1/2 to 1 tablet daily or as directed for Cholesterol  . Testosterone (ANDROGEL PUMP) 20.25 MG/ACT (1.62%) GEL Apply 4 pumps daily  - disp 6 bottles (90 day supply) with refills for 6 months  . valACYclovir (VALTREX) 1000 MG tablet  No current facility-administered medications on file prior to visit.    Allergies  Allergen Reactions  . Atenolol   . Atorvastatin   . Darvocet [Propoxyphene N-Acetaminophen]   . Paroxetine   . Pregabalin   . Propoxyphene Hcl    Past Medical History:  Diagnosis Date  . BPH (benign prostatic hypertrophy)   . 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 Erythrocytosis 04/28/2014  . Exertional dyspnea    ETT myoview 5/11: 8 min, no ischemic ECG changes, stopped due to fatigue/  SOB. EF 60%, no ischemia or infarction. Echo 5/11 EF 60%, no significant valvular abnormalities, mild RV dilation and dysfunction, no complete TR doppler jet so PA pressure hard to gauge. 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 R heart cath 6/11: mean RA 10 mmHg, PA 25/18, mean PCWP 13 mmHg, CI 2.1  . Fibromyalgia   . GERD (gastroesophageal reflux disease)   . History of epididymitis   . History of head injury without skull fracture 1980   Concussion  . Hx of adenomatous polyp of colon 06/19/2007  . Hyperlipidemia    Myalgias with Lipitor  . Hypertension   . Hypogonadism male   . Myofascial pain syndrome   . Nephrolithiasis   . Prolapsed internal hemorrhoids   . Prostatitis    Recurrent  . Rheumatic fever    x3 at ages [age] and 17; No significant valvular abnormality on 5/11 echo  . Tendinitis 2000   Hx of left bicep  . Vitamin D deficiency    Health Maintenance  Topic Date Due  . Hepatitis C Screening  08/16/2019 (Originally 01/04/1946)  . INFLUENZA VACCINE  04/26/2019  . TETANUS/TDAP  04/26/2019  . COLONOSCOPY  06/08/2020  . PNA vac Low Risk Adult  Completed   Immunization History  Administered Date(s) Administered  . Influenza, High Dose Seasonal PF 08/13/2014, 08/31/2015  . Pneumococcal Conjugate-13 08/13/2014  . Pneumococcal Polysaccharide-23 07/12/2012  . Tdap 04/25/2009  . Zoster 07/27/2007   Last Colon -06/09/2015 - Dr Carlean Purl recc f/u 5 yr Sept 2021.   Past Surgical History:  Procedure Laterality Date  . BIH REPAIR  08/2005   Incarverated fat by mesh repair  . CARDIAC CATHETERIZATION  02/2010   Left and right  . FINGER SURGERY  2000   Repair of injury to right fifth finger  . HEEL SPUR SURGERY  1960   Bilateral  . TONSILLECTOMY  1962   Family History  Problem Relation Age of Onset  . Hypertension Mother   . Breast cancer Mother   . Aortic stenosis Father 14  . Hypertension Other   . Aortic stenosis Other   . Breast cancer  Other   . Diabetes Other   . Other Other        ASHD and epilepsy  . Stroke Neg Hx   . Colon cancer Neg Hx   . Colon polyps Neg Hx    Social History   Tobacco Use  . Smoking status: Never Smoker  . Smokeless tobacco: Never Used  Substance Use Topics  . Alcohol use: Yes    Alcohol/week: 12.0 standard drinks    Types: 12 Cans of beer per week    Comment: beer and liquoer  . Drug use: No    ROS Constitutional: Denies fever, chills, weight loss/gain, headaches, insomnia,  night sweats, and change in appetite. Does c/o fatigue. Eyes: Denies redness, blurred vision, diplopia, discharge, itchy, watery eyes.  ENT: Denies discharge, congestion, post  nasal drip, epistaxis, sore throat, earache, hearing loss, dental pain, Tinnitus, Vertigo, Sinus pain, snoring.  Cardio: Denies chest pain, palpitations, irregular heartbeat, syncope, dyspnea, diaphoresis, orthopnea, PND, claudication, edema Respiratory: denies cough, dyspnea, DOE, pleurisy, hoarseness, laryngitis, wheezing.  Gastrointestinal: Denies dysphagia, heartburn, reflux, water brash, pain, cramps, nausea, vomiting, bloating, diarrhea, constipation, hematemesis, melena, hematochezia, jaundice, hemorrhoids Genitourinary: Denies dysuria, frequency,  discharge, hematuria, flank pain. Has urgency, nocturia x 2-3 & occasional hesitancy. Musculoskeletal: Denies arthralgia, myalgia, stiffness, Jt. Swelling, pain, limp, and strain/sprain. Denies falls. Skin: Denies puritis, rash, hives, warts, acne, eczema, changing in skin lesion Neuro: No weakness, tremor, incoordination, spasms, paresthesia, pain Psychiatric: Denies confusion, memory loss, sensory loss. Denies Depression. Endocrine: Denies change in weight, skin, hair change, nocturia, and paresthesia, diabetic polys, visual blurring, hyper / hypo glycemic episodes.  Heme/Lymph: No excessive bleeding, bruising, enlarged lymph nodes.  Physical Exam  BP 138/76   Pulse 80   Temp 97.7 F  (36.5 C)   Resp 16   Ht 5\' 10"  (1.778 m)   Wt 205 lb (93 kg)   BMI 29.41 kg/m   General Appearance: Over nourished, well groomed and in no apparent distress.  Eyes: PERRLA, EOMs, conjunctiva no swelling or erythema, normal fundi and vessels. Sinuses: No frontal/maxillary tenderness ENT/Mouth: EACs patent / TMs  nl. Nares clear without erythema, swelling, mucoid exudates. Oral hygiene is good. No erythema, swelling, or exudate. Tongue normal, non-obstructing. Tonsils not swollen or erythematous. Hearing normal.  Neck: Supple, thyroid not palpable. No bruits, nodes or JVD. Respiratory: Respiratory effort normal.  BS equal and clear bilateral without rales, rhonci, wheezing or stridor. Cardio: Heart sounds are normal with regular rate and rhythm and no murmurs, rubs or gallops. Peripheral pulses are normal and equal bilaterally without edema. No aortic or femoral bruits. Chest: symmetric with normal excursions and percussion. Abdomen: Flat, soft with bowel sounds active. Nontender, no guarding, rebound, hernias, masses, or organomegaly.  Lymphatics: Non tender without lymphadenopathy.  Musculoskeletal: Full ROM all peripheral extremities, joint stability, 5/5 strength, and normal gait. Skin: Warm and dry without rashes, lesions, cyanosis, clubbing or  ecchymosis.  Neuro: Cranial nerves intact, reflexes equal bilaterally. Normal muscle tone, no cerebellar symptoms. Sensation intact.  Pysch: Alert and oriented X 3, normal affect, Insight and Judgment appropriate.   Assessment and Plan  1. Annual Preventative Screening Examination  2. Essential hypertension  - EKG 12-Lead - [place], RETROPERITNL ABD,  LTD - Urinalysis, Routine w reflex microscopic - Microalbumin / creatinine urine ratio - CBC with Differential/Platelet - COMPLETE METABOLIC PANEL WITH GFR - Magnesium - TSH  3. Hyperlipidemia, mixed  - EKG 12-Lead - [place], RETROPERITNL ABD,  LTD - Lipid panel - TSH  4. Abnormal  glucose  - EKG 12-Lead - [place], RETROPERITNL ABD,  LTD - Hemoglobin A1c - Insulin, random  5. Vitamin D deficiency  - VITAMIN D 25 Hydroxyl  6. Testosterone Deficiency  - Testosterone  7. Gastroesophageal reflux disease  - CBC with Differential/Platelet  8. Screening for colorectal cancer  - POC Hemoccult Bld/Stl  9. BPH with urinary obstruction  - PSA  10. Prostate cancer screening  - PSA  11. Screening for ischemic heart disease  - EKG 12-Lead  12. FHx: heart disease  - EKG 12-Lead - [place], RETROPERITNL ABD,  LTD  13. Screening for AAA (aortic abdominal aneurysm)  - [place], RETROPERITNL ABD,  LTD  14. Medication management  - Urinalysis, Routine w reflex microscopic - Microalbumin / creatinine urine ratio - CBC with Differential/Platelet -  COMPLETE METABOLIC PANEL WITH GFR - Magnesium - Lipid panel - TSH - Hemoglobin A1c - Insulin, random - VITAMIN D 25 Hydroxyl        Patient was counseled in prudent diet to achieve/maintain BMI less than 25 for weight control, BP monitoring, regular exercise and medications. Discussed med's effects and SE's. Screening labs and tests as requested with regular follow-up as recommended. Over 40 minutes of exam, counseling, chart review and high complex critical decision making was performed.

## 2019-02-25 ENCOUNTER — Other Ambulatory Visit: Payer: Self-pay

## 2019-02-25 ENCOUNTER — Ambulatory Visit (INDEPENDENT_AMBULATORY_CARE_PROVIDER_SITE_OTHER): Payer: Medicare Other | Admitting: Internal Medicine

## 2019-02-25 VITALS — BP 138/76 | HR 80 | Temp 97.7°F | Resp 16 | Ht 70.0 in | Wt 205.0 lb

## 2019-02-25 DIAGNOSIS — E291 Testicular hypofunction: Secondary | ICD-10-CM | POA: Diagnosis not present

## 2019-02-25 DIAGNOSIS — Z0001 Encounter for general adult medical examination with abnormal findings: Secondary | ICD-10-CM

## 2019-02-25 DIAGNOSIS — K219 Gastro-esophageal reflux disease without esophagitis: Secondary | ICD-10-CM

## 2019-02-25 DIAGNOSIS — Z136 Encounter for screening for cardiovascular disorders: Secondary | ICD-10-CM | POA: Diagnosis not present

## 2019-02-25 DIAGNOSIS — Z1211 Encounter for screening for malignant neoplasm of colon: Secondary | ICD-10-CM

## 2019-02-25 DIAGNOSIS — N138 Other obstructive and reflux uropathy: Secondary | ICD-10-CM

## 2019-02-25 DIAGNOSIS — Z8249 Family history of ischemic heart disease and other diseases of the circulatory system: Secondary | ICD-10-CM | POA: Diagnosis not present

## 2019-02-25 DIAGNOSIS — E782 Mixed hyperlipidemia: Secondary | ICD-10-CM | POA: Diagnosis not present

## 2019-02-25 DIAGNOSIS — R7309 Other abnormal glucose: Secondary | ICD-10-CM

## 2019-02-25 DIAGNOSIS — Z125 Encounter for screening for malignant neoplasm of prostate: Secondary | ICD-10-CM

## 2019-02-25 DIAGNOSIS — J300 Vasomotor rhinitis: Secondary | ICD-10-CM

## 2019-02-25 DIAGNOSIS — N401 Enlarged prostate with lower urinary tract symptoms: Secondary | ICD-10-CM

## 2019-02-25 DIAGNOSIS — E559 Vitamin D deficiency, unspecified: Secondary | ICD-10-CM | POA: Diagnosis not present

## 2019-02-25 DIAGNOSIS — Z79899 Other long term (current) drug therapy: Secondary | ICD-10-CM

## 2019-02-25 DIAGNOSIS — I10 Essential (primary) hypertension: Secondary | ICD-10-CM

## 2019-02-25 DIAGNOSIS — Z1212 Encounter for screening for malignant neoplasm of rectum: Secondary | ICD-10-CM

## 2019-02-25 MED ORDER — IPRATROPIUM BROMIDE 0.06 % NA SOLN: NASAL | 3 refills | Status: DC

## 2019-02-25 MED ORDER — FAMOTIDINE 20 MG PO TABS: ORAL_TABLET | ORAL | 3 refills | Status: DC

## 2019-02-26 ENCOUNTER — Other Ambulatory Visit: Payer: Self-pay | Admitting: Internal Medicine

## 2019-02-26 DIAGNOSIS — E782 Mixed hyperlipidemia: Secondary | ICD-10-CM

## 2019-02-26 LAB — COMPLETE METABOLIC PANEL WITH GFR
AG Ratio: 2 (calc) (ref 1.0–2.5)
ALT: 25 U/L (ref 9–46)
AST: 19 U/L (ref 10–35)
Albumin: 4.1 g/dL (ref 3.6–5.1)
Alkaline phosphatase (APISO): 48 U/L (ref 35–144)
BUN/Creatinine Ratio: 13 (calc) (ref 6–22)
BUN: 15 mg/dL (ref 7–25)
CO2: 25 mmol/L (ref 20–32)
Calcium: 9.6 mg/dL (ref 8.6–10.3)
Chloride: 104 mmol/L (ref 98–110)
Creat: 1.2 mg/dL — ABNORMAL HIGH (ref 0.70–1.18)
GFR, Est African American: 70 mL/min/{1.73_m2} (ref >=60)
GFR, Est Non African American: 60 mL/min/{1.73_m2} (ref >=60)
Globulin: 2.1 g/dL (calc) (ref 1.9–3.7)
Glucose, Bld: 105 mg/dL — ABNORMAL HIGH (ref 65–99)
Potassium: 4 mmol/L (ref 3.5–5.3)
Sodium: 139 mmol/L (ref 135–146)
Total Bilirubin: 0.6 mg/dL (ref 0.2–1.2)
Total Protein: 6.2 g/dL (ref 6.1–8.1)

## 2019-02-26 LAB — CBC WITH DIFFERENTIAL/PLATELET
Absolute Monocytes: 655 cells/uL (ref 200–950)
Basophils Absolute: 39 cells/uL (ref 0–200)
Basophils Relative: 0.7 %
Eosinophils Absolute: 241 cells/uL (ref 15–500)
Eosinophils Relative: 4.3 %
HCT: 51.7 % — ABNORMAL HIGH (ref 38.5–50.0)
Hemoglobin: 18.7 g/dL — ABNORMAL HIGH (ref 13.2–17.1)
Lymphs Abs: 946 cells/uL (ref 850–3900)
MCH: 33.3 pg — ABNORMAL HIGH (ref 27.0–33.0)
MCHC: 36.2 g/dL — ABNORMAL HIGH (ref 32.0–36.0)
MCV: 92.2 fL (ref 80.0–100.0)
MPV: 11.1 fL (ref 7.5–12.5)
Monocytes Relative: 11.7 %
Neutro Abs: 3718 cells/uL (ref 1500–7800)
Neutrophils Relative %: 66.4 %
Platelets: 177 10*3/uL (ref 140–400)
RBC: 5.61 10*6/uL (ref 4.20–5.80)
RDW: 12.7 % (ref 11.0–15.0)
Total Lymphocyte: 16.9 %
WBC: 5.6 10*3/uL (ref 3.8–10.8)

## 2019-02-26 LAB — URINALYSIS, ROUTINE W REFLEX MICROSCOPIC
Bacteria, UA: NONE SEEN /HPF
Bilirubin Urine: NEGATIVE
Glucose, UA: NEGATIVE
Hgb urine dipstick: NEGATIVE
Hyaline Cast: NONE SEEN /LPF
Ketones, ur: NEGATIVE
Nitrite: NEGATIVE
Protein, ur: NEGATIVE
RBC / HPF: NONE SEEN /HPF (ref 0–2)
Specific Gravity, Urine: 1.017 (ref 1.001–1.03)
Squamous Epithelial / HPF: NONE SEEN /HPF (ref <=5)
WBC, UA: NONE SEEN /HPF (ref 0–5)
pH: 5.5 (ref 5.0–8.0)

## 2019-02-26 LAB — LIPID PANEL
Cholesterol: 199 mg/dL (ref <200)
HDL: 46 mg/dL (ref >=40)
LDL Cholesterol (Calc): 121 mg/dL (calc) — ABNORMAL HIGH
Non-HDL Cholesterol (Calc): 153 mg/dL (calc) — ABNORMAL HIGH (ref <130)
Total CHOL/HDL Ratio: 4.3 (calc) (ref <5.0)
Triglycerides: 203 mg/dL — ABNORMAL HIGH (ref <150)

## 2019-02-26 LAB — HEMOGLOBIN A1C
Hgb A1c MFr Bld: 5 % of total Hgb (ref <5.7)
Mean Plasma Glucose: 97 (calc)
eAG (mmol/L): 5.4 (calc)

## 2019-02-26 LAB — MAGNESIUM: Magnesium: 1.7 mg/dL (ref 1.5–2.5)

## 2019-02-26 LAB — TESTOSTERONE: Testosterone: 682 ng/dL (ref 250–827)

## 2019-02-26 LAB — PSA: PSA: 1 ng/mL (ref <=4.0)

## 2019-02-26 LAB — MICROALBUMIN / CREATININE URINE RATIO
Creatinine, Urine: 92 mg/dL (ref 20–320)
Microalb, Ur: <0.2 mg/dL

## 2019-02-26 LAB — INSULIN, RANDOM: Insulin: 24.3 u[IU]/mL — ABNORMAL HIGH

## 2019-02-26 LAB — VITAMIN D 25 HYDROXY (VIT D DEFICIENCY, FRACTURES): Vit D, 25-Hydroxy: 97 ng/mL (ref 30–100)

## 2019-02-26 LAB — TSH: TSH: 3.19 mIU/L (ref 0.40–4.50)

## 2019-02-26 MED ORDER — EZETIMIBE 10 MG PO TABS: ORAL_TABLET | ORAL | 1 refills | Status: DC

## 2019-04-17 ENCOUNTER — Other Ambulatory Visit: Payer: Self-pay | Admitting: Adult Health

## 2019-04-29 ENCOUNTER — Encounter: Payer: Self-pay | Admitting: Adult Health

## 2019-04-29 ENCOUNTER — Ambulatory Visit (INDEPENDENT_AMBULATORY_CARE_PROVIDER_SITE_OTHER): Payer: Medicare Other | Admitting: Adult Health

## 2019-04-29 ENCOUNTER — Other Ambulatory Visit: Payer: Self-pay

## 2019-04-29 ENCOUNTER — Ambulatory Visit: Payer: Medicare Other | Admitting: Adult Health

## 2019-04-29 DIAGNOSIS — J029 Acute pharyngitis, unspecified: Secondary | ICD-10-CM

## 2019-04-29 MED ORDER — AZITHROMYCIN 250 MG PO TABS: ORAL_TABLET | ORAL | 1 refills | Status: AC

## 2019-04-29 NOTE — Progress Notes (Signed)
Virtual Visit via Telephone Note  I connected with Curtis Price. on 04/29/19 at  3:30 PM EDT by telephone and verified that I am speaking with the correct person using two identifiers.  Location: Patient: home Provider: [place] office    I discussed the limitations, risks, security and privacy concerns of performing an evaluation and management service by telephone and the availability of in person appointments. I also discussed with the patient that there may be a patient responsible charge related to this service. The patient expressed understanding and agreed to proceed.   History of Present Illness:  There were no vitals taken for this visit.  73 y.o. male with hx of allergic rhinitis, GERD, remote rheumatic fever reports he woke up this morning with sore throat, he reports he gargled salt water and drank coffee and seems to be improved some with just a mild sore throat and hoarseness at this time. He denies cough, difficulty swallowing, jaw/facial pain, headache. Denies sense of fever/chills.   Denies recent allergy symptoms; does take zyrtec daily. He has hx of GERD well controlled with pepcid and protonix daily, patient reports seems well controlled recently without typical reflux symptoms.   Hx of rheumatic fever x 2 remotely, hx of abscess of throat, wife is currently in hospital following surgery and is very anxious regarding treating this quickly.   Per patient throat appears mildly darker pink compared to surrounding tissue, not swollen appearing, symmetrical, no white patches or discharge  Current Outpatient Medications on File Prior to Visit  Medication Sig Dispense Refill  . aspirin (ASPIR-LOW) 81 MG EC tablet Take 81 mg by mouth daily.      Marland Kitchen buPROPion (WELLBUTRIN XL) 300 MG 24 hr tablet TAKE 1 TABLET DAILY FOR MOOD 90 tablet 3  . cetirizine (ZYRTEC) 10 MG tablet Take 1 tablet (10 mg total) by mouth at bedtime. 90 tablet 1  . desoximetasone (TOPICORT) 0.25 % cream  Apply 1 application topically 2 (two) times daily. 60 g 1  . diclofenac sodium (VOLTAREN) 1 % GEL Apply 2 g topically 4 (four) times daily. 100 g 1  . ezetimibe (ZETIA) 10 MG tablet Take 1 tablet Daily for Cholesterol 90 tablet 1  . famotidine (PEPCID) 20 MG tablet Take 1 tablet 2 x /day with meals for Acid Indigestion 180 tablet 3  . fish oil-omega-3 fatty acids 1000 MG capsule Take 1 capsule by mouth 2 (two) times daily.      . hydrochlorothiazide (HYDRODIURIL) 25 MG tablet Take 1 tablet Daily for BP & Fluid Retention 90 tablet 3  . ipratropium (ATROVENT) 0.06 % nasal spray Use 1 to 2 sprays each nostril 1 to 3 x /day for Nasal Congestion 45 mL 3  . losartan (COZAAR) 100 MG tablet TAKE 1 TABLET DAILY 90 tablet 4  . Magnesium 250 MG TABS Take 1 tablet by mouth 2 (two) times daily.      . metoCLOPramide (REGLAN) 10 MG tablet Take 1 tablet 4 x / day before meals & 30 min before hour of sleep for Acid Reflux 360 tablet 1  . Multiple Vitamins-Minerals (STRESS B COMPLEX/ZINC) TABS Take 1 tablet by mouth daily.      . pantoprazole (PROTONIX) 40 MG tablet TAKE 1 TABLET DAILY FOR ACID REFLUX 90 tablet 3  . rosuvastatin (CRESTOR) 40 MG tablet Take 1/2 to 1 tablet daily or as directed for Cholesterol 30 tablet 5  . Testosterone (ANDROGEL PUMP) 20.25 MG/ACT (1.62%) GEL Apply 4 pumps daily  -  disp 6 bottles (90 day supply) with refills for 6 months 450 g 1  . valACYclovir (VALTREX) 1000 MG tablet as needed.      No current facility-administered medications on file prior to visit.      Allergies:  Allergies  Allergen Reactions  . Atenolol   . Atorvastatin   . Darvocet [Propoxyphene N-Acetaminophen]   . Paroxetine   . Pregabalin   . Propoxyphene Hcl    Medical History:  has Essential hypertension; Benign prostatic hyperplasia; GERD; Fibromyalgia; Testosterone Deficiency; Hyperlipidemia, mixed; Vitamin D deficiency; Medication management; Elevated serum immunoglobulin free light chains; Hx of  adenomatous polyp of colon; Abnormal glucose; Erectile dysfunction; and Obesity (BMI 30.0-34.9) on their problem list. Surgical History:  He  has a past surgical history that includes Cardiac catheterization (02/2010); Heel spur surgery (1960); Tonsillectomy (1962); Finger surgery (2000); and BIH REPAIR (08/2005). Family History:  Hisfamily history includes Aortic stenosis in an other family member; Aortic stenosis (age of onset: 37) in his father; Breast cancer in his mother and another family member; Diabetes in an other family member; Hypertension in his mother and another family member; Other in an other family member. Social History:   reports that he has never smoked. He has never used smokeless tobacco. He reports current alcohol use of about 12.0 standard drinks of alcohol per week. He reports that he does not use drugs.    Observations/Objective:  General : Well sounding patient in no apparent distress HEENT: no hoarseness, no cough for duration of visit Lungs: speaks in complete sentences, no audible wheezing, no apparent distress Neurological: alert, oriented x 3 Psychiatric: pleasant, judgement appropriate   Assessment and Plan:  Curtis Price was seen today for sore throat.  Diagnoses and all orders for this visit:  Sore throat New onset sx, day 1, improved since this AM Discussed the importance of avoiding unnecessary antibiotic therapy. Suggested symptomatic OTC remedies. Push hydration, salt water gargles throughout day, sugar free menthol candies If symptoms do not improve in 5-7 days or get worse contact the office Only start abx if persistent/worsening sore throat, fever/chills, cough Cautioned he should still avoid hospital if having ANY URI sx as may be contagious  Follow up as needed. -     azithromycin (ZITHROMAX) 250 MG tablet; Take 2 tablets (500 mg) on  Day 1,  followed by 1 tablet (250 mg) once daily on Days 2 through 5.   Follow Up Instructions:    I discussed  the assessment and treatment plan with the patient. The patient was provided an opportunity to ask questions and all were answered. The patient agreed with the plan and demonstrated an understanding of the instructions.   The patient was advised to call back or seek an in-person evaluation if the symptoms worsen or if the condition fails to improve as anticipated.  I provided 15 minutes of non-face-to-face time during this encounter.   Izora Ribas, NP

## 2019-05-26 NOTE — Progress Notes (Signed)
FOLLOW UP  Assessment and Plan:   Hypertension Fairly controlled with current medications;  Monitor blood pressure at home; patient to call if consistently greater than 130/80 Continue DASH diet.   Reminder to go to the ER if any CP, SOB, nausea, dizziness, severe HA, changes vision/speech, left arm numbness and tingling and jaw pain.  Cholesterol Currently above goal; on rosuvastatin 40 mg daily Lifestyle emphasized; add zetia if LDL trending above 130 Continue low cholesterol diet and exercise.  Check lipid panel.   Other abnormal glucose A1Cs recently well controlled Continue diet and exercise.  Perform daily foot/skin check, notify office of any concerning changes.  Defer A1C to CPE; check CMP  BMI 28 Long discussion about weight loss, diet, and exercise Recommended diet heavy in fruits and veggies and low in animal meats, cheeses, and dairy products, appropriate calorie intake Discussed ideal weight for height  Patient unwilling to initiate any changes at this time Will follow up in 3 months  Vitamin D Def At goal at last visit; continue supplement for goal of 60-100 Defer Vit D level  GERD Well managed on current medications without breakthrough; didn't tolerate to attempt taper to H2i Discussed diet, avoiding triggers and other lifestyle changes  Hypogonadism - continue replacement therapy, check testosterone levels as needed.  Advised weight loss, exercise, zinc 50 mg daily supplement Consider trial off of testosterone in consideration of age; prefers to defer this at this time   R flank pain Check CMP/GFR, UA; unclear etiology - musculoskeletal vs underlying organ - renal/liver/lung etiology Exam unremarkable today He wants to defer imaging today pending labs; advised try applying heat, iburpofen to evaluate response, consider muscle relaxer Recommend plain xray imaging to r/o lung abnormality, bony lesions if not quickly resolving Consider Abd [place] if  unexplained and persistent   Continue diet and meds as discussed. Further disposition pending results of labs. Discussed med's effects and SE's.   Over 30 minutes of exam, counseling, chart review, and critical decision making was performed.   Future Appointments  Date Time Provider [place]  09/02/2019 10:30 AM [name], MD [facility] None  03/19/2020 10:00 AM [name], MD [facility] None    ----------------------------------------------------------------------------------------------------------------------  HPI 73 y.o. male  presents for 3 month follow up on hypertension, cholesterol,glucose management, obesity and vitamin D deficiency.   His wife just had spinal surgery and recovering well.   Wellbutrin 300 mg daily for mood for many years and feels doing well with this, doesn't want to taper.   He reports intermittent chronic R flank/mid back pain, previously intermittent for many years, attributed to ? Back injury at work many years ago, but reports constant pain since the last visit, radiation up to shoulder; he is very concerned about his kidneys due to poor fluid intake. He reports pain as constant "squeezing" sensation, constant; sometimes worse in the AM after getting up from bed; improves after he gets up and moves. He declines xray today. He has known L1 vertebral compression fracture stable on xray in 09/2017. He reports occasionally taking iburprofen in the evening which helps him sleep.   He does have remote hx of renal calculi. Denies abd pain, n/v/d/c bloating, changes in urine or stool. Never smoker. Does drink an estimated 12 alcoholic drinks weekly.   BMI is Body mass index is 28.55 kg/m., he has not been working on diet and exercise - works a physically intense job. He drinks water during the day but "not enough" - a couple bottles.  He sleeps well. Eats 3 meals, dinner at home, picks up [employer]  Wt Readings from Last 3 Encounters:  05/28/19  199 lb (90.3 kg)  02/25/19 205 lb (93 kg)  11/13/18 204 lb (92.5 kg)   he has a diagnosis of GERD which is currently managed by protonix 40 mg and famotidine 20 mg BID, didn't tolerate switch to H2i.  he reports symptoms is currently well controlled, and denies breakthrough reflux, burning in chest, hoarseness.    He does not check his blood pressure, today their BP is BP: 136/82  He does not workout. He denies chest pain, shortness of breath, dizziness.    He is on cholesterol medication (rosuvastatin 40 mg daily, supplements omega 3) and denies myalgias. His cholesterol is not at goal. The cholesterol last visit was:   Lab Results  Component Value Date   CHOL 199 02/25/2019   HDL 46 02/25/2019   LDLCALC 121 (H) 02/25/2019   LDLDIRECT 151.0 02/10/2010   TRIG 203 (H) 02/25/2019   CHOLHDL 4.3 02/25/2019    He has not been working on diet and exercise for glucose management, and denies increased appetite, nausea, paresthesia of the feet, polydipsia, polyuria, visual disturbances and vomiting. Last A1C in the office was:  Lab Results  Component Value Date   HGBA1C 5.0 02/25/2019   Patient is on Vitamin D supplement but below goal at last visit. Dose was changed after the last visit:    Lab Results  Component Value Date   VD25OH 97 02/25/2019     He has a history of testosterone deficiency and is on testosterone replacement, does gel daily. He states that the testosterone helps with his energy, libido, muscle mass. Lab Results  Component Value Date   TESTOSTERONE 682 02/25/2019     Current Medications:  Current Outpatient Medications on File Prior to Visit  Medication Sig  . aspirin (ASPIR-LOW) 81 MG EC tablet Take 81 mg by mouth daily.    Marland Kitchen buPROPion (WELLBUTRIN XL) 300 MG 24 hr tablet TAKE 1 TABLET DAILY FOR MOOD  . cetirizine (ZYRTEC) 10 MG tablet Take 1 tablet (10 mg total) by mouth at bedtime.  Marland Kitchen desoximetasone (TOPICORT) 0.25 % cream Apply 1 application topically 2 (two)  times daily.  . diclofenac sodium (VOLTAREN) 1 % GEL Apply 2 g topically 4 (four) times daily.  Marland Kitchen ezetimibe (ZETIA) 10 MG tablet Take 1 tablet Daily for Cholesterol  . fish oil-omega-3 fatty acids 1000 MG capsule Take 1 capsule by mouth 2 (two) times daily.    . hydrochlorothiazide (HYDRODIURIL) 25 MG tablet Take 1 tablet Daily for BP & Fluid Retention  . ipratropium (ATROVENT) 0.06 % nasal spray Use 1 to 2 sprays each nostril 1 to 3 x /day for Nasal Congestion  . losartan (COZAAR) 100 MG tablet TAKE 1 TABLET DAILY  . Magnesium 250 MG TABS Take 1 tablet by mouth 2 (two) times daily.    . metoCLOPramide (REGLAN) 10 MG tablet Take 1 tablet 4 x / day before meals & 30 min before hour of sleep for Acid Reflux  . Multiple Vitamins-Minerals (STRESS B COMPLEX/ZINC) TABS Take 1 tablet by mouth daily.    . pantoprazole (PROTONIX) 40 MG tablet TAKE 1 TABLET DAILY FOR ACID REFLUX  . rosuvastatin (CRESTOR) 40 MG tablet Take 1/2 to 1 tablet daily or as directed for Cholesterol  . Testosterone (ANDROGEL PUMP) 20.25 MG/ACT (1.62%) GEL Apply 4 pumps daily  - disp 6 bottles (90 day supply) with refills for  6 months  . valACYclovir (VALTREX) 1000 MG tablet as needed.    No current facility-administered medications on file prior to visit.      Allergies:  Allergies  Allergen Reactions  . Atenolol   . Atorvastatin   . Darvocet [Propoxyphene N-Acetaminophen]   . Paroxetine   . Pregabalin   . Propoxyphene Hcl      Medical History:  Past Medical History:  Diagnosis Date  . BPH (benign prostatic hypertrophy)   . 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 Erythrocytosis 04/28/2014  . Exertional dyspnea    ETT myoview 5/11: 8 min, no ischemic ECG changes, stopped due to fatigue/ SOB. EF 60%, no ischemia or infarction. Echo 5/11 EF 60%, no significant valvular abnormalities, mild RV dilation and dysfunction, no complete TR doppler jet so PA pressure hard to gauge. 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 R heart cath 6/11: mean RA 10 mmHg, PA 25/18, mean PCWP 13 mmHg, CI 2.1  . Fibromyalgia   . GERD (gastroesophageal reflux disease)   . History of epididymitis   . History of head injury without skull fracture 1980   Concussion  . Hx of adenomatous polyp of colon 06/19/2007  . Hyperlipidemia    Myalgias with Lipitor  . Hypertension   . Hypogonadism male   . Myofascial pain syndrome   . Nephrolithiasis   . Prolapsed internal hemorrhoids   . Prostatitis    Recurrent  . Rheumatic fever    x3 at ages [age] and 74; No significant valvular abnormality on 5/11 echo  . Tendinitis 2000   Hx of left bicep  . Vitamin D deficiency    Family history- Reviewed and unchanged Social history- Reviewed and unchanged   Review of Systems:  Review of Systems  Constitutional: Negative for malaise/fatigue and weight loss.  HENT: Positive for hearing loss (Chronic ongoing) and tinnitus (Chronic ongoing). Negative for congestion, ear discharge, ear pain, nosebleeds, sinus pain and sore throat.   Eyes: Negative for blurred vision and double vision.  Respiratory: Negative for cough, shortness of breath, wheezing and stridor.   Cardiovascular: Negative for chest pain, palpitations, orthopnea, claudication and leg swelling.  Gastrointestinal: Negative for abdominal pain, blood in stool, constipation, diarrhea, heartburn, melena, nausea and vomiting.  Genitourinary: Positive for flank pain. Negative for dysuria, frequency, hematuria and urgency.  Musculoskeletal: Negative for joint pain and myalgias.  Skin: Negative for rash.  Neurological: Negative for dizziness, tingling, sensory change, weakness and headaches.  Endo/Heme/Allergies: Negative for polydipsia.  Psychiatric/Behavioral: Negative.  Negative for substance abuse. The patient is not nervous/anxious and does not have insomnia.   All other systems reviewed and are negative.   Physical Exam: BP 136/82    Pulse 70   Temp (!) 97.5 F (36.4 C)   Wt 199 lb (90.3 kg)   SpO2 96%   BMI 28.55 kg/m  Wt Readings from Last 3 Encounters:  05/28/19 199 lb (90.3 kg)  02/25/19 205 lb (93 kg)  11/13/18 204 lb (92.5 kg)   General Appearance: Well nourished, in no apparent distress. Eyes: PERRLA, EOMs, conjunctiva no swelling or erythema Sinuses: No Frontal/maxillary tenderness ENT/Mouth: R Ext aud canal clear, L obstructed by dry cerumen. R TM without erythema, bulging. No erythema, swelling, or exudate on post pharynx.  Tonsils not swollen or erythematous. Hearing normal.  Neck: Supple, thyroid normal.  Respiratory: Respiratory effort normal, BS equal bilaterally without rales, rhonchi, wheezing or stridor. Somewhat coarse throughout. Cardio: RRR  with no MRGs. Brisk peripheral pulses without edema.  Abdomen: Soft, + BS.  Non tender, no guarding, rebound, hernias, masses. Lymphatics: Non tender without lymphadenopathy.  Musculoskeletal: Full ROM, 5/5 strength, Normal gait. No CVA tenderness, no midline spine tenderness; no palpable muscle spasm; pain over R lower ribs/flank without notable tenderness or palpable abnormality  Skin: Warm, dry without rashes, lesions, ecchymosis.  Neuro: Cranial nerves intact. No cerebellar symptoms.  Psych: Awake and oriented X 3, normal affect, Insight and Judgment appropriate.    Izora Ribas, NP 11:29 AM [facility]

## 2019-05-28 ENCOUNTER — Encounter: Payer: Self-pay | Admitting: Adult Health

## 2019-05-28 ENCOUNTER — Ambulatory Visit (INDEPENDENT_AMBULATORY_CARE_PROVIDER_SITE_OTHER): Payer: Medicare Other | Admitting: Adult Health

## 2019-05-28 ENCOUNTER — Other Ambulatory Visit: Payer: Self-pay

## 2019-05-28 VITALS — BP 136/82 | HR 70 | Temp 97.5°F | Wt 199.0 lb

## 2019-05-28 DIAGNOSIS — F341 Dysthymic disorder: Secondary | ICD-10-CM | POA: Insufficient documentation

## 2019-05-28 DIAGNOSIS — E291 Testicular hypofunction: Secondary | ICD-10-CM

## 2019-05-28 DIAGNOSIS — I10 Essential (primary) hypertension: Secondary | ICD-10-CM

## 2019-05-28 DIAGNOSIS — E559 Vitamin D deficiency, unspecified: Secondary | ICD-10-CM

## 2019-05-28 DIAGNOSIS — Z6829 Body mass index (BMI) 29.0-29.9, adult: Secondary | ICD-10-CM

## 2019-05-28 DIAGNOSIS — K219 Gastro-esophageal reflux disease without esophagitis: Secondary | ICD-10-CM | POA: Diagnosis not present

## 2019-05-28 DIAGNOSIS — R10A1 Flank pain, right side: Secondary | ICD-10-CM

## 2019-05-28 DIAGNOSIS — R109 Unspecified abdominal pain: Secondary | ICD-10-CM

## 2019-05-28 DIAGNOSIS — E782 Mixed hyperlipidemia: Secondary | ICD-10-CM | POA: Diagnosis not present

## 2019-05-28 DIAGNOSIS — Z79899 Other long term (current) drug therapy: Secondary | ICD-10-CM

## 2019-05-28 DIAGNOSIS — R7309 Other abnormal glucose: Secondary | ICD-10-CM

## 2019-05-28 DIAGNOSIS — B001 Herpesviral vesicular dermatitis: Secondary | ICD-10-CM

## 2019-05-28 MED ORDER — FAMOTIDINE 20 MG PO TABS: ORAL_TABLET | ORAL | 3 refills | Status: DC

## 2019-05-28 MED ORDER — ROSUVASTATIN CALCIUM 40 MG PO TABS: ORAL_TABLET | ORAL | 3 refills | Status: DC

## 2019-05-28 NOTE — Patient Instructions (Addendum)
Goals    . Blood Pressure < 130/80    . DIET - INCREASE WATER INTAKE     65+ fluid ounces daily     . LDL CALC < 100    . Weight (lb) < 190 lb (86.2 kg)       Check supplements for zinc dose - men can take up to 50 mg daily - helps with testosterone and immunes sytem   Try applying heat to back; we should consider a back xray and abdominal ultrasound if persistent/unexplained   Flank Pain, Adult Flank pain is pain that is located on the side of the body between the upper abdomen and the back. This area is called the flank. The pain may occur over a short period of time (acute), or it may be long-term or recurring (chronic). It may be mild or severe. Flank pain can be caused by many things, including:  Muscle soreness or injury.  Kidney stones or kidney disease.  Stress.  A disease of the spine (vertebral disk disease).  A lung infection (pneumonia).  Fluid around the lungs (pulmonary edema).  A skin rash caused by the chickenpox virus (shingles).  Tumors that affect the back of the abdomen.  Gallbladder disease. Follow these instructions at home:   Drink enough fluid to keep your urine clear or pale yellow.  Rest as told by your health care provider.  Take over-the-counter and prescription medicines only as told by your health care provider.  Keep a journal to track what has caused your flank pain and what has made it feel better.  Keep all follow-up visits as told by your health care provider. This is important. Contact a health care provider if:  Your pain is not controlled with medicine.  You have new symptoms.  Your pain gets worse.  You have a fever.  Your symptoms last longer than 2-3 days.  You have trouble urinating or you are urinating very frequently. Get help right away if:  You have trouble breathing or you are short of breath.  Your abdomen hurts or it is swollen or red.  You have nausea or vomiting.  You feel faint or you pass  out.  You have blood in your urine. Summary  Flank pain is pain that is located on the side of the body between the upper abdomen and the back.  The pain may occur over a short period of time (acute), or it may be long-term or recurring (chronic). It may be mild or severe.  Flank pain can be caused by many things.  Contact your health care provider if your symptoms get worse or they last longer than 2-3 days. This information is not intended to replace advice given to you by your health care provider. Make sure you discuss any questions you have with your health care provider. Document Released: 11/02/2005 Document Revised: 08/24/2017 Document Reviewed: 11/24/2016 Elsevier Patient Education  2020 [employer].

## 2019-05-29 ENCOUNTER — Encounter: Payer: Self-pay | Admitting: Adult Health

## 2019-05-29 ENCOUNTER — Other Ambulatory Visit: Payer: Self-pay | Admitting: Adult Health

## 2019-05-29 ENCOUNTER — Ambulatory Visit: Payer: Self-pay

## 2019-05-29 DIAGNOSIS — D751 Secondary polycythemia: Secondary | ICD-10-CM

## 2019-05-29 LAB — CBC WITH DIFFERENTIAL/PLATELET
Absolute Monocytes: 710 cells/uL (ref 200–950)
Basophils Absolute: 39 cells/uL (ref 0–200)
Basophils Relative: 0.7 %
Eosinophils Absolute: 242 cells/uL (ref 15–500)
Eosinophils Relative: 4.4 %
HCT: 55.5 % — ABNORMAL HIGH (ref 38.5–50.0)
Hemoglobin: 19.9 g/dL — ABNORMAL HIGH (ref 13.2–17.1)
Lymphs Abs: 1100 cells/uL (ref 850–3900)
MCH: 33.1 pg — ABNORMAL HIGH (ref 27.0–33.0)
MCHC: 35.9 g/dL (ref 32.0–36.0)
MCV: 92.2 fL (ref 80.0–100.0)
MPV: 11.3 fL (ref 7.5–12.5)
Monocytes Relative: 12.9 %
Neutro Abs: 3410 cells/uL (ref 1500–7800)
Neutrophils Relative %: 62 %
Platelets: 172 10*3/uL (ref 140–400)
RBC: 6.02 10*6/uL — ABNORMAL HIGH (ref 4.20–5.80)
RDW: 12.9 % (ref 11.0–15.0)
Total Lymphocyte: 20 %
WBC: 5.5 10*3/uL (ref 3.8–10.8)

## 2019-05-29 LAB — URINALYSIS, ROUTINE W REFLEX MICROSCOPIC
Bacteria, UA: NONE SEEN /HPF
Bilirubin Urine: NEGATIVE
Glucose, UA: NEGATIVE
Hgb urine dipstick: NEGATIVE
Hyaline Cast: NONE SEEN /LPF
Ketones, ur: NEGATIVE
Nitrite: NEGATIVE
Protein, ur: NEGATIVE
Specific Gravity, Urine: 1.019 (ref 1.001–1.03)
Squamous Epithelial / HPF: NONE SEEN /HPF (ref <=5)
pH: 6.5 (ref 5.0–8.0)

## 2019-05-29 LAB — COMPLETE METABOLIC PANEL WITH GFR
AG Ratio: 2 (calc) (ref 1.0–2.5)
ALT: 31 U/L (ref 9–46)
AST: 24 U/L (ref 10–35)
Albumin: 4.5 g/dL (ref 3.6–5.1)
Alkaline phosphatase (APISO): 49 U/L (ref 35–144)
BUN: 16 mg/dL (ref 7–25)
CO2: 26 mmol/L (ref 20–32)
Calcium: 9.8 mg/dL (ref 8.6–10.3)
Chloride: 102 mmol/L (ref 98–110)
Creat: 1.04 mg/dL (ref 0.70–1.18)
GFR, Est African American: 83 mL/min/{1.73_m2} (ref >=60)
GFR, Est Non African American: 71 mL/min/{1.73_m2} (ref >=60)
Globulin: 2.2 g/dL (calc) (ref 1.9–3.7)
Glucose, Bld: 105 mg/dL — ABNORMAL HIGH (ref 65–99)
Potassium: 4.1 mmol/L (ref 3.5–5.3)
Sodium: 139 mmol/L (ref 135–146)
Total Bilirubin: 1 mg/dL (ref 0.2–1.2)
Total Protein: 6.7 g/dL (ref 6.1–8.1)

## 2019-05-29 LAB — LIPID PANEL
Cholesterol: 206 mg/dL — ABNORMAL HIGH (ref <200)
HDL: 51 mg/dL (ref >=40)
LDL Cholesterol (Calc): 127 mg/dL (calc) — ABNORMAL HIGH
Non-HDL Cholesterol (Calc): 155 mg/dL (calc) — ABNORMAL HIGH (ref <130)
Total CHOL/HDL Ratio: 4 (calc) (ref <5.0)
Triglycerides: 164 mg/dL — ABNORMAL HIGH (ref <150)

## 2019-05-29 LAB — TSH: TSH: 2.73 mIU/L (ref 0.40–4.50)

## 2019-05-29 LAB — MAGNESIUM: Magnesium: 1.8 mg/dL (ref 1.5–2.5)

## 2019-06-05 ENCOUNTER — Ambulatory Visit (INDEPENDENT_AMBULATORY_CARE_PROVIDER_SITE_OTHER): Payer: Medicare Other

## 2019-06-05 ENCOUNTER — Other Ambulatory Visit: Payer: Self-pay

## 2019-06-05 DIAGNOSIS — D751 Secondary polycythemia: Secondary | ICD-10-CM

## 2019-06-05 LAB — CBC WITH DIFFERENTIAL/PLATELET
Absolute Monocytes: 396 cells/uL (ref 200–950)
Basophils Absolute: 39 cells/uL (ref 0–200)
Basophils Relative: 0.7 %
Eosinophils Absolute: 237 cells/uL (ref 15–500)
Eosinophils Relative: 4.3 %
HCT: 51.3 % — ABNORMAL HIGH (ref 38.5–50.0)
Hemoglobin: 18.2 g/dL — ABNORMAL HIGH (ref 13.2–17.1)
Lymphs Abs: 1073 cells/uL (ref 850–3900)
MCH: 32.7 pg (ref 27.0–33.0)
MCHC: 35.5 g/dL (ref 32.0–36.0)
MCV: 92.3 fL (ref 80.0–100.0)
MPV: 11.4 fL (ref 7.5–12.5)
Monocytes Relative: 7.2 %
Neutro Abs: 3757 cells/uL (ref 1500–7800)
Neutrophils Relative %: 68.3 %
Platelets: 166 10*3/uL (ref 140–400)
RBC: 5.56 10*6/uL (ref 4.20–5.80)
RDW: 12.4 % (ref 11.0–15.0)
Total Lymphocyte: 19.5 %
WBC: 5.5 10*3/uL (ref 3.8–10.8)

## 2019-06-05 NOTE — Progress Notes (Signed)
Patient presents to the office for a nurse visit to have labs rechecked. Patient was advised prior to take a baby aspirin daily and to discontinue taking testosterone. Patient states that he has always taken a baby aspirin daily, and has had two pumps of his testosterone in a week.  Vitals taken and recorded.

## 2019-06-06 ENCOUNTER — Other Ambulatory Visit: Payer: Self-pay | Admitting: Adult Health

## 2019-06-11 ENCOUNTER — Other Ambulatory Visit: Payer: Self-pay | Admitting: Adult Health

## 2019-06-11 MED ORDER — SERTRALINE HCL 100 MG PO TABS: 100.0000 mg | ORAL_TABLET | Freq: Every day | ORAL | 0 refills | Status: DC

## 2019-07-09 ENCOUNTER — Other Ambulatory Visit: Payer: Self-pay | Admitting: Internal Medicine

## 2019-07-09 DIAGNOSIS — K219 Gastro-esophageal reflux disease without esophagitis: Secondary | ICD-10-CM

## 2019-07-09 MED ORDER — LOSARTAN POTASSIUM 100 MG PO TABS: ORAL_TABLET | ORAL | 3 refills | Status: DC

## 2019-07-09 MED ORDER — FAMOTIDINE 40 MG PO TABS: ORAL_TABLET | ORAL | 3 refills | Status: DC

## 2019-09-01 ENCOUNTER — Encounter: Payer: Self-pay | Admitting: Internal Medicine

## 2019-09-01 NOTE — Progress Notes (Signed)
History of Present Illness:      This very nice 73 y.o. MWM presents for 6 month follow up with HTN, HLD, Gerd and Vitamin D Deficiency. Patient's  GERD is controlled on his meds.      Patient is treated for HTN  (1994)  & BP has been controlled at home. Todays BP is at goal - 126/80.  Heart cath in 2011 was Negative. Patient has had no complaints of any cardiac type chest pain, palpitations, dyspnea / orthopnea / PND, dizziness, claudication, or dependent edema.      Hyperlipidemia is not controlled with diet & meds. Patient denies myalgias or other med SEs. Last Lipids were not at goal:  Lab Results  Component Value Date   CHOL 206 (H) 05/28/2019   HDL 51 05/28/2019   LDLCALC 127 (H) 05/28/2019   LDLDIRECT 151.0 02/10/2010   TRIG 164 (H) 05/28/2019   CHOLHDL 4.0 05/28/2019        Also, the patient is moderately overweight  (BMI  29+) and is monitored expectantly for glucose intolerance / prediabetes. Patient has had no symptoms of reactive hypoglycemia, diabetic polys, paresthesias or visual blurring.  Last A1c was Normal & at goal:  Lab Results  Component Value Date   HGBA1C 5.0 02/25/2019        Patient has hx/o Low T and has been on replacement with gel with improved sense of well being.      Further, the patient also has history of Vitamin D Deficiency ("38" / 2016)  and supplements vitamin D without any suspected side-effects. Last vitamin D was at goal:  Lab Results  Component Value Date   VD25OH 97 02/25/2019    Current Outpatient Medications on File Prior to Visit  Medication Sig   aspirin (ASPIR-LOW) 81 MG EC tablet Take 81 mg by mouth daily.     B Complex-C (SUPER B COMPLEX PO) Take 1 tablet by mouth daily.   buPROPion (WELLBUTRIN XL) 300 MG 24 hr tablet TAKE 1 TABLET DAILY FOR MOOD   cetirizine (ZYRTEC) 10 MG tablet Take 1 tablet (10 mg total) by mouth at bedtime.   desoximetasone (TOPICORT) 0.25 % cream Apply 1 application topically 2 (two) times  daily.   diclofenac sodium (VOLTAREN) 1 % GEL Apply 2 g topically 4 (four) times daily.   ezetimibe (ZETIA) 10 MG tablet Take 1 tablet Daily for Cholesterol   famotidine (PEPCID) 40 MG tablet Take 1 tablet Daily for Indigestion & Heartburn   fish oil-omega-3 fatty acids 1000 MG capsule Take 1 capsule by mouth 2 (two) times daily.     hydrochlorothiazide (HYDRODIURIL) 25 MG tablet Take 1 tablet Daily for BP & Fluid Retention   ipratropium (ATROVENT) 0.06 % nasal spray Use 1 to 2 sprays each nostril 1 to 3 x /day for Nasal Congestion   losartan (COZAAR) 100 MG tablet Take 1 tablet Daily for BP   Magnesium 250 MG TABS Take 1 tablet by mouth 2 (two) times daily.     Multiple Vitamins-Minerals (STRESS B COMPLEX/ZINC) TABS Take 1 tablet by mouth daily.     OVER THE COUNTER MEDICATION Takes calcium, magnesium zinc vitamin 2 times a day.   pantoprazole (PROTONIX) 40 MG tablet TAKE 1 TABLET DAILY FOR ACID REFLUX   rosuvastatin (CRESTOR) 40 MG tablet Take 1 tablet daily for Cholesterol   sertraline (ZOLOFT) 100 MG tablet Take 1 tablet (100 mg total) by mouth daily.   vitamin C (ASCORBIC ACID)  500 MG tablet Take 500 mg by mouth daily.   VITAMIN D PO Take 5,000 Units by mouth 2 (two) times daily.   zinc gluconate 50 MG tablet Take 50 mg by mouth daily.   No current facility-administered medications on file prior to visit.     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 EF 60%, no ischemia or infarction. Echo 5/11 EF 60%, no significant valvular abnormalities, mild RV dilation and dysfunction,  no complete TR doppler jet so PA pressure hard to gauge. 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 R heart cath 6/11: mean RA 10 mmHg, PA 25/18, mean PCWP 13 mmHg, CI 2.1   Fibromyalgia    GERD (gastroesophageal reflux disease)    History of epididymitis    History of head injury without skull fracture 1980   Concussion   Hx of adenomatous polyp of colon 06/19/2007   Hyperlipidemia    Myalgias with Lipitor   Hypertension    Hypogonadism male    Myofascial pain syndrome    Nephrolithiasis    Prolapsed internal hemorrhoids    Prostatitis    Recurrent   Rheumatic fever    x3 at ages [age] and 85; No significant valvular abnormality on 5/11 echo   Tendinitis 2000   Hx of left bicep   Vitamin D deficiency    Immunization History  Administered Date(s) Administered   Influenza, High Dose Seasonal PF 08/13/2014, 08/31/2015   Pneumococcal Conjugate-13 08/13/2014   Pneumococcal Polysaccharide-23 07/12/2012   Tdap 04/25/2009   Zoster 07/27/2007   Past Surgical History:  Procedure Laterality Date   BIH REPAIR  08/2005   Incarverated fat by mesh repair   CARDIAC CATHETERIZATION  02/2010   Left and right   FINGER SURGERY  2000   Repair of injury to right fifth finger   HEEL SPUR SURGERY  1960   Bilateral   TONSILLECTOMY  1962    FHx:    Reviewed / unchanged  SHx:    Reviewed / unchanged   Systems Review:  Constitutional: Denies fever, chills, wt changes, headaches, insomnia, fatigue, night sweats, change in appetite. Eyes: Denies redness, blurred vision, diplopia, discharge, itchy, watery eyes.  ENT: Denies discharge, congestion, post nasal drip, epistaxis, sore throat, earache, hearing loss, dental pain, tinnitus, vertigo, sinus pain, snoring.  CV: Denies chest pain, palpitations, irregular heartbeat, syncope, dyspnea, diaphoresis, orthopnea, PND, claudication or edema. Respiratory: denies cough, dyspnea, DOE, pleurisy,  hoarseness, laryngitis, wheezing.  Gastrointestinal: Denies dysphagia, odynophagia, heartburn, reflux, water brash, abdominal pain or cramps, nausea, vomiting, bloating, diarrhea, constipation, hematemesis, melena, hematochezia  or hemorrhoids. Genitourinary: Denies dysuria, frequency, urgency, nocturia, hesitancy, discharge, hematuria or flank pain. Musculoskeletal: Denies arthralgias, myalgias, stiffness, jt. swelling, pain, limping or strain/sprain.  Skin: Denies pruritus, rash, hives, warts, acne, eczema or change in skin lesion(s). Neuro: No weakness, tremor, incoordination, spasms, paresthesia or pain. Psychiatric: Denies confusion, memory loss or sensory loss. Endo: Denies change in weight, skin or hair change.  Heme/Lymph: No excessive bleeding, bruising or enlarged lymph nodes.  Physical  Exam  BP 126/80    Pulse 72    Temp (!) 97 F (36.1 C)    Resp 16    Ht 5\' 10"  (1.778 m)    Wt 205 lb 3.2 oz (93.1 kg)    BMI 29.44 kg/m   Appears  over nourished, well groomed  and in no distress.  Eyes: PERRLA, EOMs, conjunctiva no swelling or erythema. Sinuses: No frontal/maxillary tenderness ENT/Mouth: EAC's clear, TM's nl w/o erythema, bulging. Nares clear w/o erythema, swelling, exudates. Oropharynx clear without erythema or exudates. Oral hygiene is good. Tongue normal, non obstructing. Hearing intact.  Neck: Supple. Thyroid not palpable. Car 2+/2+ without bruits, nodes or JVD. Chest: Respirations nl with BS clear & equal w/o rales, rhonchi, wheezing or stridor.  Cor: Heart sounds normal w/ regular rate and rhythm without sig. murmurs, gallops, clicks or rubs. Peripheral pulses normal and equal  without edema.  Abdomen: Soft & bowel sounds normal. Non-tender w/o guarding, rebound, hernias, masses or organomegaly.  Lymphatics: Unremarkable.  Musculoskeletal: Full ROM all peripheral extremities, joint stability, 5/5 strength and normal gait.  Skin: Warm, dry without exposed rashes, lesions or  ecchymosis apparent.  Neuro: Cranial nerves intact, reflexes equal bilaterally. Sensory-motor testing grossly intact. Tendon reflexes grossly intact.  Pysch: Alert & oriented x 3.  Insight and judgement nl & appropriate. No ideations.  Assessment and Plan:  1. Essential hypertension  - Continue medication, monitor blood pressure at home.  - Continue DASH diet.  Reminder to go to the ER if any CP,  SOB, nausea, dizziness, severe HA, changes vision/speech.  - CBC with Diff - COMPLETE METABOLIC PANEL WITH GFR - Magnesium - TSH  2. Hyperlipidemia, mixed  - Continue diet/meds, exercise,& lifestyle modifications.  - Continue monitor periodic cholesterol/liver & renal functions   - Lipid Profile - TSH  3. Abnormal glucose  - Continue diet, exercise  - Lifestyle modifications.  - Monitor appropriate labs.  - Hemoglobin A1c ([facility]) - Insulin, random  4. Vitamin D deficiency  - Continue supplementation.  - Vitamin D (25 hydroxy)  5. Testosterone Deficiency  - Testosterone, Total  6. Gastroesophageal reflux disease  - CBC with Diff  7. Medication management  - CBC with Diff - COMPLETE METABOLIC PANEL WITH GFR - Magnesium - Lipid Profile - TSH - Hemoglobin A1c ([facility]) - Insulin, random - Vitamin D (25 hydroxy) - Testosterone, Total        Discussed  regular exercise, BP monitoring, weight control to achieve/maintain BMI less than 25 and discussed med and SE's. Recommended labs to assess and monitor clinical status with further disposition pending results of labs.  I discussed the assessment and treatment plan with the patient. The patient was provided an opportunity to ask questions and all were answered. The patient agreed with the plan and demonstrated an understanding of the instructions.  I provided over 30 minutes of exam, counseling, chart review and  complex critical decision making.  Kirtland Bouchard, MD

## 2019-09-01 NOTE — Patient Instructions (Signed)

## 2019-09-02 ENCOUNTER — Ambulatory Visit (INDEPENDENT_AMBULATORY_CARE_PROVIDER_SITE_OTHER): Payer: Medicare Other | Admitting: Internal Medicine

## 2019-09-02 ENCOUNTER — Other Ambulatory Visit: Payer: Self-pay

## 2019-09-02 VITALS — BP 126/80 | HR 72 | Temp 97.0°F | Resp 16 | Ht 70.0 in | Wt 205.2 lb

## 2019-09-02 DIAGNOSIS — Z136 Encounter for screening for cardiovascular disorders: Secondary | ICD-10-CM | POA: Diagnosis not present

## 2019-09-02 DIAGNOSIS — K219 Gastro-esophageal reflux disease without esophagitis: Secondary | ICD-10-CM

## 2019-09-02 DIAGNOSIS — I10 Essential (primary) hypertension: Secondary | ICD-10-CM

## 2019-09-02 DIAGNOSIS — E291 Testicular hypofunction: Secondary | ICD-10-CM

## 2019-09-02 DIAGNOSIS — Z79899 Other long term (current) drug therapy: Secondary | ICD-10-CM

## 2019-09-02 DIAGNOSIS — E559 Vitamin D deficiency, unspecified: Secondary | ICD-10-CM

## 2019-09-02 DIAGNOSIS — E782 Mixed hyperlipidemia: Secondary | ICD-10-CM

## 2019-09-02 DIAGNOSIS — M1810 Unilateral primary osteoarthritis of first carpometacarpal joint, unspecified hand: Secondary | ICD-10-CM

## 2019-09-02 DIAGNOSIS — R7309 Other abnormal glucose: Secondary | ICD-10-CM

## 2019-09-02 MED ORDER — DICLOFENAC SODIUM 1 % EX GEL: CUTANEOUS | 3 refills | Status: DC

## 2019-09-02 MED ORDER — DESOXIMETASONE 0.25 % EX CREA: TOPICAL_CREAM | CUTANEOUS | 3 refills | Status: DC

## 2019-09-03 LAB — CBC WITH DIFFERENTIAL/PLATELET
Absolute Monocytes: 878 cells/uL (ref 200–950)
Basophils Absolute: 53 cells/uL (ref 0–200)
Basophils Relative: 0.7 %
Eosinophils Absolute: 263 cells/uL (ref 15–500)
Eosinophils Relative: 3.5 %
HCT: 47.1 % (ref 38.5–50.0)
Hemoglobin: 16.9 g/dL (ref 13.2–17.1)
Lymphs Abs: 1275 cells/uL (ref 850–3900)
MCH: 33.9 pg — ABNORMAL HIGH (ref 27.0–33.0)
MCHC: 35.9 g/dL (ref 32.0–36.0)
MCV: 94.4 fL (ref 80.0–100.0)
MPV: 11.6 fL (ref 7.5–12.5)
Monocytes Relative: 11.7 %
Neutro Abs: 5033 cells/uL (ref 1500–7800)
Neutrophils Relative %: 67.1 %
Platelets: 161 10*3/uL (ref 140–400)
RBC: 4.99 10*6/uL (ref 4.20–5.80)
RDW: 12.9 % (ref 11.0–15.0)
Total Lymphocyte: 17 %
WBC: 7.5 10*3/uL (ref 3.8–10.8)

## 2019-09-03 LAB — TSH: TSH: 3.42 mIU/L (ref 0.40–4.50)

## 2019-09-03 LAB — COMPLETE METABOLIC PANEL WITH GFR
AG Ratio: 2.1 (calc) (ref 1.0–2.5)
ALT: 27 U/L (ref 9–46)
AST: 21 U/L (ref 10–35)
Albumin: 4.4 g/dL (ref 3.6–5.1)
Alkaline phosphatase (APISO): 47 U/L (ref 35–144)
BUN: 19 mg/dL (ref 7–25)
CO2: 28 mmol/L (ref 20–32)
Calcium: 9.3 mg/dL (ref 8.6–10.3)
Chloride: 103 mmol/L (ref 98–110)
Creat: 1.16 mg/dL (ref 0.70–1.18)
GFR, Est African American: 73 mL/min/{1.73_m2} (ref >=60)
GFR, Est Non African American: 63 mL/min/{1.73_m2} (ref >=60)
Globulin: 2.1 g/dL (calc) (ref 1.9–3.7)
Glucose, Bld: 108 mg/dL — ABNORMAL HIGH (ref 65–99)
Potassium: 4.2 mmol/L (ref 3.5–5.3)
Sodium: 140 mmol/L (ref 135–146)
Total Bilirubin: 0.7 mg/dL (ref 0.2–1.2)
Total Protein: 6.5 g/dL (ref 6.1–8.1)

## 2019-09-03 LAB — HEMOGLOBIN A1C
Hgb A1c MFr Bld: 5 % of total Hgb (ref <5.7)
Mean Plasma Glucose: 97 (calc)
eAG (mmol/L): 5.4 (calc)

## 2019-09-03 LAB — VITAMIN D 25 HYDROXY (VIT D DEFICIENCY, FRACTURES): Vit D, 25-Hydroxy: 93 ng/mL (ref 30–100)

## 2019-09-03 LAB — LIPID PANEL
Cholesterol: 129 mg/dL (ref <200)
HDL: 59 mg/dL (ref >=40)
LDL Cholesterol (Calc): 48 mg/dL (calc)
Non-HDL Cholesterol (Calc): 70 mg/dL (calc) (ref <130)
Total CHOL/HDL Ratio: 2.2 (calc) (ref <5.0)
Triglycerides: 139 mg/dL (ref <150)

## 2019-09-03 LAB — INSULIN, RANDOM: Insulin: 10.3 u[IU]/mL

## 2019-09-03 LAB — MAGNESIUM: Magnesium: 1.8 mg/dL (ref 1.5–2.5)

## 2019-09-03 LAB — TESTOSTERONE: Testosterone: 332 ng/dL (ref 250–827)

## 2019-09-10 ENCOUNTER — Other Ambulatory Visit: Payer: Self-pay | Admitting: Internal Medicine

## 2019-09-10 MED ORDER — MOMETASONE FUROATE 0.1 % EX CREA: TOPICAL_CREAM | CUTANEOUS | 3 refills | Status: DC

## 2019-09-11 ENCOUNTER — Telehealth: Payer: Self-pay | Admitting: *Deleted

## 2019-09-11 ENCOUNTER — Other Ambulatory Visit: Payer: Self-pay | Admitting: *Deleted

## 2019-09-11 NOTE — Telephone Encounter (Signed)
Spoke with spouse regarding medication authorizations. Patient will try OTC Voltaren and the Topicort RX was discontinued and Elocon was sent to his pharmacy by Dr Melford Aase.

## 2019-11-11 ENCOUNTER — Telehealth: Payer: Self-pay | Admitting: Adult Health

## 2019-11-11 NOTE — Telephone Encounter (Signed)
Patient spouse called to request new pharm and 90 ct request (g) zoloft 100mg  QD. [facility]

## 2019-11-12 ENCOUNTER — Other Ambulatory Visit: Payer: Self-pay | Admitting: Internal Medicine

## 2019-11-12 ENCOUNTER — Other Ambulatory Visit: Payer: Self-pay | Admitting: Adult Health

## 2019-11-12 DIAGNOSIS — F341 Dysthymic disorder: Secondary | ICD-10-CM

## 2019-11-12 DIAGNOSIS — K219 Gastro-esophageal reflux disease without esophagitis: Secondary | ICD-10-CM

## 2019-11-12 MED ORDER — PANTOPRAZOLE SODIUM 40 MG PO TBEC: DELAYED_RELEASE_TABLET | ORAL | 3 refills | Status: DC

## 2019-11-12 MED ORDER — SERTRALINE HCL 100 MG PO TABS: ORAL_TABLET | ORAL | 3 refills | Status: DC

## 2019-12-01 NOTE — Progress Notes (Signed)
3 Month Follow Up   Assessment and Plan:   Curtis Price was seen today for follow-up.  Diagnoses and all orders for this visit:  Essential hypertension Continue current medications: Monitor blood pressure at home; call if consistently over 130/80 Continue DASH diet.   Reminder to go to the ER if any CP, SOB, nausea, dizziness, severe HA, changes vision/speech, left arm numbness and tingling and jaw pain. -     CBC with Differential/Platelet -     COMPLETE METABOLIC PANEL WITH GFR -     Magnesium  Hyperlipidemia, mixed Continue medications: Discussed dietary and exercise modifications Low fat diet -     Lipid panel  Abnormal glucose Discussed dietary and exercise modifications  Vitamin D deficiency Continue supplementation Taking Vitamin D 5000 IU daily, two tablets At goal last check, defer today  Gastroesophageal reflux disease without esophagitis Doing well at this time Continue:  Diet discussed Monitor for triggers Avoid food with high acid content Avoid excessive cafeine Increase water intake  Testosterone Deficiency No replacements at this time Defer labs   BPH with urinary obstruction Doing well at this time Continue medications:  Will continue to monitor Defer PSA this check  Medication management Continued  BMI 29.0-29.9,adult Discussed dietary and exercise modifications  Need for Tdap vaccination Due for vaccination, 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 -     Tdap vaccine greater than or equal to 7yo IM         Discussed side effects, monitor     Continue diet and meds as discussed. Further disposition pending results of labs. Discussed med's effects and SE's.  Patient agrees with plan of care and opportunity to ask questions/voice concerns. Over 30 minutes of chart review, face to face interview, exam, counseling, and critical decision making was performed.   Future Appointments  Date Time Provider Gordon  03/19/2020  10:00 AM [name], MD [facility] None    ----------------------------------------------------------------------------------------------------------------------  HPI 74 y.o. male  presents for 3 month follow up on HTN, HLD, abnormal glucose with history of pre-diabetes, GERD, testosterone deficiency, weight and vitamin D deficiency.  His last OV was 05/28/19 for routine follow up.  Today he reports he is doing well overall.  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  He reports that he does not do purposeful exercise but he is on his feet and walking around the majority of the day.    He has history of depression and has been taking Wellbutrin 300mg  daily and doing well.  BMI is Body mass index is 29.99 kg/m., he has been working on diet and exercise. Wt Readings from Last 3 Encounters:  12/02/19 209 lb (94.8 kg)  09/02/19 205 lb 3.2 oz (93.1 kg)  06/05/19 203 lb (92.1 kg)     HTN predates 1994. Negative heart cath in 2011. His blood pressure has been controlled at home, today their BP is BP: 136/74  He does not workout. He denies any cardiac symptoms, chest pains, palpitations, shortness of breath, dizziness or lower extremity edema.      He is on cholesterol medication Rosuvastatin and denies myalgias.   His cholesterol is not at goal. The cholesterol last visit was:   Lab Results  Component Value Date   CHOL 129 09/02/2019   HDL 59 09/02/2019   LDLCALC 48 09/02/2019   LDLDIRECT 151.0 02/10/2010   TRIG 139 09/02/2019   CHOLHDL 2.2 09/02/2019    He has not  been working on diet and exercise for prediabetes, and denies increased appetite, nausea, paresthesia of the feet, polydipsia, polyuria, visual disturbances, vomiting and weight loss. Last A1C in the office was:  Lab Results  Component Value Date   HGBA1C 5.0 09/02/2019     Patient does not have history of CKD.  Their last GFR was:  Lab Results  Component Value Date   GFRNONAA 63  09/02/2019   Lab Results  Component Value Date   GFRAA 73 09/02/2019     Patient is on Vitamin D supplement for deficiency noted in 2016 (38).  Lab Results  Component Value Date   VD25OH 62 09/02/2019      He has a history of testosterone deficiency, first noted in 2013,  and is on testosterone replacement, androgel 4 pumps daily. He states that the testosterone helps with his energy, libido, muscle mass. Denies symptoms of elevated estrogen or dihydrotestosterone.  Lab Results  Component Value Date   TESTOSTERONE 332 09/02/2019     Current Medications:  Current Outpatient Medications on File Prior to Visit  Medication Sig  . aspirin (ASPIR-LOW) 81 MG EC tablet Take 81 mg by mouth daily.    . B Complex-C (SUPER B COMPLEX PO) Take 1 tablet by mouth daily.  Marland Kitchen buPROPion (WELLBUTRIN XL) 300 MG 24 hr tablet TAKE 1 TABLET DAILY FOR MOOD  . cetirizine (ZYRTEC) 10 MG tablet Take 1 tablet (10 mg total) by mouth at bedtime.  Marland Kitchen ezetimibe (ZETIA) 10 MG tablet Take 1 tablet Daily for Cholesterol  . famotidine (PEPCID) 40 MG tablet Take 1 tablet Daily for Indigestion & Heartburn  . fish oil-omega-3 fatty acids 1000 MG capsule Take 1 capsule by mouth 2 (two) times daily.    . hydrochlorothiazide (HYDRODIURIL) 25 MG tablet Take 1 tablet Daily for BP & Fluid Retention  . ipratropium (ATROVENT) 0.06 % nasal spray Use 1 to 2 sprays each nostril 1 to 3 x /day for Nasal Congestion  . losartan (COZAAR) 100 MG tablet Take 1 tablet Daily for BP  . Magnesium 250 MG TABS Take 1 tablet by mouth 2 (two) times daily.    . mometasone (ELOCON) 0.1 % cream Apply to affected area 2 x /daily  . Multiple Vitamins-Minerals (STRESS B COMPLEX/ZINC) TABS Take 1 tablet by mouth daily.    Marland Kitchen OVER THE COUNTER MEDICATION Takes calcium, magnesium zinc vitamin 2 times a day.  . pantoprazole (PROTONIX) 40 MG tablet Take 1 tablet Daily for Indigestion & Heartburn  . rosuvastatin (CRESTOR) 40 MG tablet Take 1 tablet daily for  Cholesterol  . sertraline (ZOLOFT) 100 MG tablet Take 1 tablet Daily for Mood  . vitamin C (ASCORBIC ACID) 500 MG tablet Take 500 mg by mouth daily.  Marland Kitchen VITAMIN D PO Take 5,000 Units by mouth 2 (two) times daily.  Marland Kitchen zinc gluconate 50 MG tablet Take 50 mg by mouth daily.   No current facility-administered medications on file prior to visit.    Allergies:  Allergies  Allergen Reactions  . Atenolol   . Atorvastatin   . Darvocet [Propoxyphene N-Acetaminophen]   . Paroxetine   . Pregabalin   . Propoxyphene Hcl       Medical History:  Past Medical History:  Diagnosis Date  . BPH (benign prostatic hypertrophy)   . 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 Erythrocytosis 04/28/2014  . Exertional dyspnea    ETT myoview 5/11: 8 min, no ischemic ECG changes, stopped due to fatigue/ SOB. EF  60%, no ischemia or infarction. Echo 5/11 EF 60%, no significant valvular abnormalities, mild RV dilation and dysfunction, no complete TR doppler jet so PA pressure hard to gauge. 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 R heart cath 6/11: mean RA 10 mmHg, PA 25/18, mean PCWP 13 mmHg, CI 2.1  . Fibromyalgia   . GERD (gastroesophageal reflux disease)   . History of epididymitis   . History of head injury without skull fracture 1980   Concussion  . Hx of adenomatous polyp of colon 06/19/2007  . Hyperlipidemia    Myalgias with Lipitor  . Hypertension   . Hypogonadism male   . Myofascial pain syndrome   . Nephrolithiasis   . Prolapsed internal hemorrhoids   . Prostatitis    Recurrent  . Rheumatic fever    x3 at ages [age] and 7; No significant valvular abnormality on 5/11 echo  . Tendinitis 2000   Hx of left bicep  . Vitamin D deficiency      Family history- Reviewed and unchanged Family History  Problem Relation Age of Onset  . Hypertension Mother   . Breast cancer Mother   . Aortic stenosis Father 49  . Hypertension Other   . Aortic stenosis  Other   . Breast cancer Other   . Diabetes Other   . Other Other        ASHD and epilepsy  . Stroke Neg Hx   . Colon cancer Neg Hx   . Colon polyps Neg Hx      Social history- Reviewed and unchanged Social History   Tobacco Use  . Smoking status: Never Smoker  . Smokeless tobacco: Never Used  Substance Use Topics  . Alcohol use: Yes    Alcohol/week: 12.0 standard drinks    Types: 12 Cans of beer per week    Comment: beer and liquoer  . Drug use: No     Names of Other Physician/Practitioners you currently use: 1. [facility] here for primary care 2. Eye Exam: Due for 2021 3. Dental Exam [facility] for 2021   Patient Care Team: [name], MD as PCP - General (Internal Medicine) Larey Dresser, MD as Consulting Physician (Cardiology) Sable Feil, MD as Consulting Physician (Gastroenterology) Heath Lark, MD as Consulting Physician (Hematology and Oncology) Bensimhon, Shaune Pascal, MD as Consulting Physician (Cardiology)    Screening Tests: Immunization History  Administered Date(s) Administered  . Influenza, High Dose Seasonal PF 08/13/2014, 08/31/2015  . Pneumococcal Conjugate-13 08/13/2014  . Pneumococcal Polysaccharide-23 07/12/2012  . Tdap 04/25/2009  . Zoster 07/27/2007     Vaccinations: TD or Tdap: 2010- Due received today  Influenza: Due for 2021  Pneumococcal: 07/12/2012 Prevnar13: 08/13/2014 Shingles: Zostavax/Shingrix: Discussed shingrix   Preventative Care: Last colonoscopy: 05/2015 Hep C screening [id number]): Declined this visit   Review of Systems:  ROS    Physical Exam: BP 136/74   Pulse 74   Temp (!) 97.5 F (36.4 C)   Wt 209 lb (94.8 kg)   SpO2 97%   BMI 29.99 kg/m  Wt Readings from Last 3 Encounters:  12/02/19 209 lb (94.8 kg)  09/02/19 205 lb 3.2 oz (93.1 kg)  06/05/19 203 lb (92.1 kg)   General Appearance: Well nourished, in no apparent distress. Eyes: PERRLA, EOMs,  conjunctiva no swelling or erythema Sinuses: No Frontal/maxillary tenderness ENT/Mouth: Ext aud canals clear, TMs without erythema, bulging. No erythema, swelling, or exudate on post pharynx.  Tonsils not swollen  or erythematous. Hearing normal.  Neck: Supple, thyroid normal.  Respiratory: Respiratory effort normal, BS equal bilaterally without rales, rhonchi, wheezing or stridor.  Cardio: RRR with no MRGs. Brisk peripheral pulses without edema.  Abdomen: Soft, + BS.  Non tender, no guarding, rebound, hernias, masses. Lymphatics: Non tender without lymphadenopathy.  Musculoskeletal: Full ROM, 5/5 strength, Normal gait Skin: Warm, dry without rashes, lesions, ecchymosis.  Neuro: Cranial nerves intact. No cerebellar symptoms.  Psych: Awake and oriented X 3, normal affect, Insight and Judgment appropriate.    Garnet Sierras, NP [facility] 12:15 PM

## 2019-12-02 ENCOUNTER — Encounter: Payer: Self-pay | Admitting: Adult Health Nurse Practitioner

## 2019-12-02 ENCOUNTER — Ambulatory Visit (INDEPENDENT_AMBULATORY_CARE_PROVIDER_SITE_OTHER): Payer: Medicare Other | Admitting: Adult Health Nurse Practitioner

## 2019-12-02 ENCOUNTER — Other Ambulatory Visit: Payer: Self-pay

## 2019-12-02 VITALS — BP 136/74 | HR 74 | Temp 97.5°F | Wt 209.0 lb

## 2019-12-02 DIAGNOSIS — Z23 Encounter for immunization: Secondary | ICD-10-CM | POA: Diagnosis not present

## 2019-12-02 DIAGNOSIS — I10 Essential (primary) hypertension: Secondary | ICD-10-CM | POA: Diagnosis not present

## 2019-12-02 DIAGNOSIS — Z79899 Other long term (current) drug therapy: Secondary | ICD-10-CM

## 2019-12-02 DIAGNOSIS — N401 Enlarged prostate with lower urinary tract symptoms: Secondary | ICD-10-CM

## 2019-12-02 DIAGNOSIS — K219 Gastro-esophageal reflux disease without esophagitis: Secondary | ICD-10-CM

## 2019-12-02 DIAGNOSIS — D751 Secondary polycythemia: Secondary | ICD-10-CM

## 2019-12-02 DIAGNOSIS — E559 Vitamin D deficiency, unspecified: Secondary | ICD-10-CM

## 2019-12-02 DIAGNOSIS — R7309 Other abnormal glucose: Secondary | ICD-10-CM

## 2019-12-02 DIAGNOSIS — E782 Mixed hyperlipidemia: Secondary | ICD-10-CM | POA: Diagnosis not present

## 2019-12-02 DIAGNOSIS — Z6829 Body mass index (BMI) 29.0-29.9, adult: Secondary | ICD-10-CM

## 2019-12-02 DIAGNOSIS — E291 Testicular hypofunction: Secondary | ICD-10-CM

## 2019-12-02 DIAGNOSIS — N138 Other obstructive and reflux uropathy: Secondary | ICD-10-CM

## 2019-12-02 NOTE — Patient Instructions (Addendum)
We will call you with your lab results in 1-3 days.   Increase water intake 64-80 ounces of water a day.   You received the Tetanus vaccination today.  This is good for 10years.  This vaccine is given in the muscle, this evening or tomorrow it will be sore.       Ask insurance and pharmacy about shingrix - new vaccine   Can go to [email or web address] for more information  Shingrix Vaccination  Two vaccines are licensed and recommended to prevent shingles in the [place] Zoster vaccine live (ZVL, Zostavax) has been in use since 2006. Recombinant zoster vaccine (RZV, Shingrix), has been in use since 2017 and is recommended by [employer] as the preferred shingles vaccine.  What Everyone Should Know about Shingles Vaccine (Shingrix) One of the Recommended Vaccines by Disease Shingles vaccination is the only way to protect against shingles and postherpetic neuralgia (PHN), the most common complication from shingles. CDC recommends that healthy adults 50 years and older get two doses of the shingles vaccine called Shingrix (recombinant zoster vaccine), separated by 2 to 6 months, to prevent shingles and the complications from the disease. Your doctor or pharmacist can give you Shingrix as a shot in your upper arm. Shingrix provides strong protection against shingles and PHN. Two doses of Shingrix is more than 90% effective at preventing shingles and PHN. Protection stays above 85% for at least the first four years after you get vaccinated. Shingrix is the preferred vaccine, over Zostavax (zoster vaccine live), a shingles vaccine in use since 2006. Zostavax may still be used to prevent shingles in healthy adults 60 years and older. For example, you could use Zostavax if a person is allergic to Shingrix, prefers Zostavax, or requests immediate vaccination and Shingrix is unavailable. Who Should Get Shingrix? Healthy adults 50 years and older should get two  doses of Shingrix, separated by 2 to 6 months. You should get Shingrix even if in the past you . had shingles  . received Zostavax  . are not sure if you had chickenpox There is no maximum age for getting Shingrix. If you had shingles in the past, you can get Shingrix to help prevent future occurrences of the disease. There is no specific length of time that you need to wait after having shingles before you can receive Shingrix, but generally you should make sure the shingles rash has gone away before getting vaccinated. You can get Shingrix whether or not you remember having had chickenpox in the past. Studies show that more than 99% of Americans 40 years and older have had chickenpox, even if they don't remember having the disease. Chickenpox and shingles are related because they are caused by the same virus (varicella zoster virus). After a person recovers from chickenpox, the virus stays dormant (inactive) in the body. It can reactivate years later and cause shingles. If you had Zostavax in the recent past, you should wait at least eight weeks before getting Shingrix. Talk to your healthcare provider to determine the best time to get Shingrix. Shingrix is available in [facility] and pharmacies. To find doctor's offices or pharmacies near you that offer the vaccine, visit [facility] Vaccine FinderExternal. If you have questions about Shingrix, talk with your healthcare provider. Vaccine for Those 13 Years and Older  Shingrix reduces the risk of shingles and PHN by more than 90% in people 23 and older. CDC recommends the vaccine for healthy adults 85 and older.  Who Should Not Get Shingrix? You should not  get Shingrix if you: . have ever had a severe allergic reaction to any component of the vaccine or after a dose of Shingrix  . tested negative for immunity to varicella zoster virus. If you test negative, you should get chickenpox vaccine.  . currently have shingles  . currently are  pregnant or breastfeeding. Women who are pregnant or breastfeeding should wait to get Shingrix.  Marland Kitchen receive specific antiviral drugs (acyclovir, famciclovir, or valacyclovir) 24 hours before vaccination (avoid use of these antiviral drugs for 14 days after vaccination)- zoster vaccine live only If you have a minor acute (starts suddenly) illness, such as a cold, you may get Shingrix. But if you have a moderate or severe acute illness, you should usually wait until you recover before getting the vaccine. This includes anyone with a temperature of 101.35F or higher. The side effects of the Shingrix are temporary, and usually last 2 to 3 days. While you may experience pain for a few days after getting Shingrix, the pain will be less severe than having shingles and the complications from the disease. How Well Does Shingrix Work? Two doses of Shingrix provides strong protection against shingles and postherpetic neuralgia (PHN), the most common complication of shingles. . In adults 34 to 73 years old who got two doses, Shingrix was 97% effective in preventing shingles; among adults 70 years and older, Shingrix was 91% effective.  . In adults 20 to 72 years old who got two doses, Shingrix was 91% effective in preventing PHN; among adults 70 years and older, Shingrix was 89% effective. Shingrix protection remained high (more than 85%) in people 70 years and older throughout the four years following vaccination. Since your risk of shingles and PHN increases as you get older, it is important to have strong protection against shingles in your older years. Top of Page  What Are the Possible Side Effects of Shingrix? Studies show that Shingrix is safe. The vaccine helps your body create a strong defense against shingles. As a result, you are likely to have temporary side effects from getting the shots. The side effects may affect your ability to do normal daily activities for 2 to 3 days. Most people got a sore arm  with mild or moderate pain after getting Shingrix, and some also had redness and swelling where they got the shot. Some people felt tired, had muscle pain, a headache, shivering, fever, stomach pain, or nausea. About 1 out of 6 people who got Shingrix experienced side effects that prevented them from doing regular activities. Symptoms went away on their own in about 2 to 3 days. Side effects were more common in younger people. You might have a reaction to the first or second dose of Shingrix, or both doses. If you experience side effects, you may choose to take over-the-counter pain medicine such as ibuprofen or acetaminophen. If you experience side effects from Shingrix, you should report them to the Vaccine Adverse Event Reporting System (VAERS). Your doctor might file this report, or you can do it yourself through the VAERS websiteExternal, or by calling [phone]. If you have any questions about side effects from Shingrix, talk with your doctor. The shingles vaccine does not contain thimerosal (a preservative containing mercury). Top of Page  When Should I See a Doctor Because of the Side Effects I Experience From Shingrix? In clinical trials, Shingrix was not associated with serious adverse events. In fact, serious side effects from vaccines are extremely rare. For example, for every 1 million doses  of a vaccine given, only one or two people may have a severe allergic reaction. Signs of an allergic reaction happen within minutes or hours after vaccination and include hives, swelling of the face and throat, difficulty breathing, a fast heartbeat, dizziness, or weakness. If you experience these or any other life-threatening symptoms, see a doctor right away. Shingrix causes a strong response in your immune system, so it may produce short-term side effects more intense than you are used to from other vaccines. These side effects can be uncomfortable, but they are expected and usually go away on their  own in 2 or 3 days. Top of Page  How Can I Pay For Shingrix? There are several ways shingles vaccine may be paid for: Medicare . Medicare Part D plans cover the shingles vaccine, but there may be a cost to you depending on your plan. There may be a copay for the vaccine, or you may need to pay in full then get reimbursed for a certain amount.  . Medicare Part B does not cover the shingles vaccine. Medicaid . Medicaid may or may not cover the vaccine. Contact your insurer to find out. Private health insurance . Many private health insurance plans will cover the vaccine, but there may be a cost to you depending on your plan. Contact your insurer to find out. Vaccine assistance programs . Some pharmaceutical companies provide vaccines to eligible adults who cannot afford them. You may want to check with the vaccine manufacturer, [employer], about Shingrix. If you do not currently have health insurance, learn more about affordable health coverage optionsExternal. To find doctor's offices or pharmacies near you that offer the vaccine, visit HealthMap Vaccine FinderExternal.     Preventive Care for Adults A healthy lifestyle and preventive care can promote health and wellness. Preventive health guidelines for men include the following key practices:  A routine yearly physical is a good way to check with your health care provider about your health and preventative screening. It is a chance to share any concerns and updates on your health and to receive a thorough exam.  Visit your dentist for a routine exam and preventative care every 6 months. Brush your teeth twice a day and floss once a day. Good oral hygiene prevents tooth decay and gum disease.  The frequency of eye exams is based on your age, health, family medical history, use of contact lenses, and other factors. Follow your health care provider's recommendations for frequency of eye exams.  Eat a healthy diet. Foods such as  vegetables, fruits, whole grains, low-fat dairy products, and lean protein foods contain the nutrients you need without too many calories. Decrease your intake of foods high in solid fats, added sugars, and salt. Eat the right amount of calories for you.Get information about a proper diet from your health care provider, if necessary.  Regular physical exercise is one of the most important things you can do for your health. Most adults should get at least 150 minutes of moderate-intensity exercise (any activity that increases your heart rate and causes you to sweat) each week. In addition, most adults need muscle-strengthening exercises on 2 or more days a week.  Maintain a healthy weight. The body mass index (BMI) is a screening tool to identify possible weight problems. It provides an estimate of body fat based on height and weight. Your health care provider can find your BMI and can help you achieve or maintain a healthy weight.For adults 20 years and older:  A  BMI below 18.5 is considered underweight.  A BMI of 18.5 to 24.9 is normal.  A BMI of 25 to 29.9 is considered overweight.  A BMI of 30 and above is considered obese.  Maintain normal blood lipids and cholesterol levels by exercising and minimizing your intake of saturated fat. Eat a balanced diet with plenty of fruit and vegetables. Blood tests for lipids and cholesterol should begin at age 33 and be repeated every 5 years. If your lipid or cholesterol levels are high, you are over 50, or you are at high risk for heart disease, you may need your cholesterol levels checked more frequently.Ongoing high lipid and cholesterol levels should be treated with medicines if diet and exercise are not working.  If you smoke, find out from your health care provider how to quit. If you do not use tobacco, do not start.  Lung cancer screening is recommended for adults aged 6-80 years who are at high risk for developing lung cancer because of a  history of smoking. A yearly low-dose CT scan of the lungs is recommended for people who have at least a 30-pack-year history of smoking and are a current smoker or have quit within the past 15 years. A pack year of smoking is smoking an average of 1 pack of cigarettes a day for 1 year (for example: 1 pack a day for 30 years or 2 packs a day for 15 years). Yearly screening should continue until the smoker has stopped smoking for at least 15 years. Yearly screening should be stopped for people who develop a health problem that would prevent them from having lung cancer treatment.  If you choose to drink alcohol, do not have more than 2 drinks per day. One drink is considered to be 12 ounces (355 mL) of beer, 5 ounces (148 mL) of wine, or 1.5 ounces (44 mL) of liquor.  Avoid use of street drugs. Do not share needles with anyone. Ask for help if you need support or instructions about stopping the use of drugs.  High blood pressure causes heart disease and increases the risk of stroke. Your blood pressure should be checked at least every 1-2 years. Ongoing high blood pressure should be treated with medicines, if weight loss and exercise are not effective.  If you are 13-60 years old, ask your health care provider if you should take aspirin to prevent heart disease.  Diabetes screening involves taking a blood sample to check your fasting blood sugar level. Testing should be considered at a younger age or be carried out more frequently if you are overweight and have at least 1 risk factor for diabetes.  Colorectal cancer can be detected and often prevented. Most routine colorectal cancer screening begins at the age of 67 and continues through age 29. However, your health care provider may recommend screening at an earlier age if you have risk factors for colon cancer. On a yearly basis, your health care provider may provide home test kits to check for hidden blood in the stool. Use of a small camera at the end  of a tube to directly examine the colon (sigmoidoscopy or colonoscopy) can detect the earliest forms of colorectal cancer. Talk to your health care provider about this at age 18, when routine screening begins. Direct exam of the colon should be repeated every 5-10 years through age 94, unless early forms of precancerous polyps or small growths are found.  Hepatitis C blood testing is recommended for all people born from  1945 through 1965 and any individual with known risks for hepatitis C.  New guidelines recommend a once time screening for HIV.   Screening for abdominal aortic aneurysm (AAA)  by ultrasound is recommended for people who have history of high blood pressure or who are current or former smokers.  Healthy men should  receive prostate-specific antigen (PSA) blood tests as part of routine cancer screening. Talk with your health care provider about prostate cancer screening.  Testicular cancer screening is  recommended for adult males. Screening includes self-exam, a health care provider exam, and other screening tests. Consult with your health care provider about any symptoms you have or any concerns you have about testicular cancer.  Use sunscreen. Apply sunscreen liberally and repeatedly throughout the day. You should seek shade when your shadow is shorter than you. Protect yourself by wearing long sleeves, pants, a wide-brimmed hat, and sunglasses year round, whenever you are outdoors.  Once a month, do a whole-body skin exam, using a mirror to look at the skin on your back. Tell your health care provider about new moles, moles that have irregular borders, moles that are larger than a pencil eraser, or moles that have changed in shape or color.  Stay current with required vaccines (immunizations).  Influenza vaccine. All adults should be immunized every year.  Tetanus, diphtheria, and acellular pertussis (Td, Tdap) vaccine. An adult who has not previously received Tdap or who does  not know his vaccine status should receive 1 dose of Tdap. This initial dose should be followed by tetanus and diphtheria toxoids (Td) booster doses every 10 years. Adults with an unknown or incomplete history of completing a 3-dose immunization series with Td-containing vaccines should begin or complete a primary immunization series including a Tdap dose. Adults should receive a Td booster every 10 years.  Zoster vaccine. One dose is recommended for adults aged 22 years or older unless certain conditions are present.    PREVNAR - Pneumococcal 13-valent conjugate (PCV13) vaccine. When indicated, a person who is uncertain of his immunization history and has no record of immunization should receive the PCV13 vaccine. An adult aged 54 years or older who has certain medical conditions and has not been previously immunized should receive 1 dose of PCV13 vaccine. This PCV13 should be followed with a dose of pneumococcal polysaccharide (PPSV23) vaccine. The PPSV23 vaccine dose should be obtained at least 8 weeks after the dose of PCV13 vaccine. An adult aged 42 years or older who has certain medical conditions and previously received 1 or more doses of PPSV23 vaccine should receive 1 dose of PCV13. The PCV13 vaccine dose should be obtained 1 or more years after the last PPSV23 vaccine dose.    PNEUMOVAX - Pneumococcal polysaccharide (PPSV23) vaccine. When PCV13 is also indicated, PCV13 should be obtained first. All adults aged 58 years and older should be immunized. An adult younger than age 49 years who has certain medical conditions should be immunized. Any person who resides in a nursing home or long-term care facility should be immunized. An adult smoker should be immunized. People with an immunocompromised condition and certain other conditions should receive both PCV13 and PPSV23 vaccines. People with human immunodeficiency virus (HIV) infection should be immunized as soon as possible after diagnosis.  Immunization during chemotherapy or radiation therapy should be avoided. Routine use of PPSV23 vaccine is not recommended for American Indians, [place] Natives, or people younger than 65 years unless there are medical conditions that require PPSV23 vaccine. When indicated,  people who have unknown immunization and have no record of immunization should receive PPSV23 vaccine. One-time revaccination 5 years after the first dose of PPSV23 is recommended for people aged 19-64 years who have chronic kidney failure, nephrotic syndrome, asplenia, or immunocompromised conditions. People who received 1-2 doses of PPSV23 before age 73 years should receive another dose of PPSV23 vaccine at age 62 years or later if at least 5 years have passed since the previous dose. Doses of PPSV23 are not needed for people immunized with PPSV23 at or after age 32 years.    Hepatitis A vaccine. Adults who wish to be protected from this disease, have certain high-risk conditions, work with hepatitis A-infected animals, work in hepatitis A research labs, or travel to or work in countries with a high rate of hepatitis A should be immunized. Adults who were previously unvaccinated and who anticipate close contact with an international adoptee during the first 60 days after arrival in the [place] from a country with a high rate of hepatitis A should be immunized.    Hepatitis B vaccine. Adults should be immunized if they wish to be protected from this disease, have certain high-risk conditions, may be exposed to blood or other infectious body fluids, are household contacts or sex partners of hepatitis B positive people, are clients or workers in certain care facilities, or travel to or work in countries with a high rate of hepatitis B.   Preventive Service / Frequency   Ages 23 and over  Blood pressure check.  Lipid and cholesterol check.  Lung cancer screening. / Every year if you are aged 50-80 years and have a  30-pack-year history of smoking and currently smoke or have quit within the past 15 years. Yearly screening is stopped once you have quit smoking for at least 15 years or develop a health problem that would prevent you from having lung cancer treatment.  Fecal occult blood test (FOBT) of stool. You may not have to do this test if you get a colonoscopy every 10 years.  Flexible sigmoidoscopy** or colonoscopy.** / Every 5 years for a flexible sigmoidoscopy or every 10 years for a colonoscopy beginning at age 85 and continuing until age 45.  Hepatitis C blood test.** / For all people born from [birth year] through 1964 and any individual with known risks for hepatitis C.  Abdominal aortic aneurysm (AAA) screening./ Screening current or former smokers or have Hypertension.  Skin self-exam. / Monthly.  Influenza vaccine. / Every year.  Tetanus, diphtheria, and acellular pertussis (Tdap/Td) vaccine.** / 1 dose of Td every 10 years.   Zoster vaccine.** / 1 dose for adults aged 23 years or older.         Pneumococcal 13-valent conjugate (PCV13) vaccine.    Pneumococcal polysaccharide (PPSV23) vaccine.     Hepatitis A vaccine.** / Consult your health care provider.  Hepatitis B vaccine.** / Consult your health care provider. Screening for abdominal aortic aneurysm (AAA)  by ultrasound is recommended for people who have history of high blood pressure or who are current or former smokers.

## 2019-12-03 ENCOUNTER — Other Ambulatory Visit: Payer: Self-pay | Admitting: Adult Health Nurse Practitioner

## 2019-12-03 DIAGNOSIS — D582 Other hemoglobinopathies: Secondary | ICD-10-CM

## 2019-12-03 DIAGNOSIS — D751 Secondary polycythemia: Secondary | ICD-10-CM

## 2019-12-08 LAB — CBC WITH DIFFERENTIAL/PLATELET
Absolute Monocytes: 614 cells/uL (ref 200–950)
Basophils Absolute: 37 cells/uL (ref 0–200)
Basophils Relative: 0.5 %
Eosinophils Absolute: 148 cells/uL (ref 15–500)
Eosinophils Relative: 2 %
HCT: 54.2 % — ABNORMAL HIGH (ref 38.5–50.0)
Hemoglobin: 18.9 g/dL — ABNORMAL HIGH (ref 13.2–17.1)
Lymphs Abs: 1066 cells/uL (ref 850–3900)
MCH: 32.4 pg (ref 27.0–33.0)
MCHC: 34.9 g/dL (ref 32.0–36.0)
MCV: 92.8 fL (ref 80.0–100.0)
MPV: 11.6 fL (ref 7.5–12.5)
Monocytes Relative: 8.3 %
Neutro Abs: 5535 cells/uL (ref 1500–7800)
Neutrophils Relative %: 74.8 %
Platelets: 154 10*3/uL (ref 140–400)
RBC: 5.84 10*6/uL — ABNORMAL HIGH (ref 4.20–5.80)
RDW: 12.2 % (ref 11.0–15.0)
Total Lymphocyte: 14.4 %
WBC: 7.4 10*3/uL (ref 3.8–10.8)

## 2019-12-08 LAB — LIPID PANEL
Cholesterol: 130 mg/dL (ref <200)
HDL: 58 mg/dL (ref >=40)
LDL Cholesterol (Calc): 48 mg/dL (calc)
Non-HDL Cholesterol (Calc): 72 mg/dL (calc) (ref <130)
Total CHOL/HDL Ratio: 2.2 (calc) (ref <5.0)
Triglycerides: 153 mg/dL — ABNORMAL HIGH (ref <150)

## 2019-12-08 LAB — COMPLETE METABOLIC PANEL WITH GFR
AG Ratio: 2 (calc) (ref 1.0–2.5)
ALT: 32 U/L (ref 9–46)
AST: 24 U/L (ref 10–35)
Albumin: 4.3 g/dL (ref 3.6–5.1)
Alkaline phosphatase (APISO): 51 U/L (ref 35–144)
BUN/Creatinine Ratio: 12 (calc) (ref 6–22)
BUN: 16 mg/dL (ref 7–25)
CO2: 29 mmol/L (ref 20–32)
Calcium: 9.4 mg/dL (ref 8.6–10.3)
Chloride: 104 mmol/L (ref 98–110)
Creat: 1.39 mg/dL — ABNORMAL HIGH (ref 0.70–1.18)
GFR, Est African American: 58 mL/min/{1.73_m2} — ABNORMAL LOW (ref >=60)
GFR, Est Non African American: 50 mL/min/{1.73_m2} — ABNORMAL LOW (ref >=60)
Globulin: 2.1 g/dL (calc) (ref 1.9–3.7)
Glucose, Bld: 99 mg/dL (ref 65–99)
Potassium: 4.4 mmol/L (ref 3.5–5.3)
Sodium: 138 mmol/L (ref 135–146)
Total Bilirubin: 0.7 mg/dL (ref 0.2–1.2)
Total Protein: 6.4 g/dL (ref 6.1–8.1)

## 2019-12-08 LAB — TEST AUTHORIZATION

## 2019-12-08 LAB — FERRITIN: Ferritin: 95 ng/mL (ref 24–380)

## 2019-12-08 LAB — ERYTHROPOIETIN: Erythropoietin: 4.4 m[IU]/mL (ref 2.6–18.5)

## 2019-12-08 LAB — MAGNESIUM: Magnesium: 1.8 mg/dL (ref 1.5–2.5)

## 2019-12-11 NOTE — Addendum Note (Signed)
Addended byGarnet Sierras A on: 12/11/2019 09:13 AM   Modules accepted: Orders

## 2019-12-12 NOTE — Addendum Note (Signed)
Addended byGarnet Sierras A on: 12/12/2019 11:51 AM   Modules accepted: Orders

## 2019-12-24 ENCOUNTER — Telehealth: Payer: Self-pay | Admitting: Hematology

## 2019-12-24 NOTE — Telephone Encounter (Signed)
Scheduled per referral. Called and spoke with pt, confirmed 4/8 appt. Pt made aware to arrive 18mins early and to bring insurance card and photo ID.

## 2019-12-31 NOTE — Progress Notes (Signed)
HEMATOLOGY/ONCOLOGY CONSULTATION NOTE  Date of Service: 01/01/2020  Patient Care Team: [name], MD as PCP - General (Internal Medicine) Larey Dresser, MD as Consulting Physician (Cardiology) Sable Feil, MD as Consulting Physician (Gastroenterology) Heath Lark, MD as Consulting Physician (Hematology and Oncology) Bensimhon, Shaune Pascal, MD as Consulting Physician (Cardiology)  REFERRING PHYSICIAN: [name], MD  CHIEF COMPLAINTS/PURPOSE OF CONSULTATION:  Secondary Polycythemia   HISTORY OF PRESENTING ILLNESS:   Curtis Price. is a wonderful 74 y.o. 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 The pt reports that he is doing well overall.   The pt reports he is good. Pt saw Dr. Alvy Bimler about 5-6 years ago. He has been using testosterone for 15 years but he stopped using it about a week ago.  He was taking testosterone through a shot but was switched to a gel. Pt reports no COPD, asthma, sleep apnea, blood clots, or blood disorders that run in the family. He is still currently taking hydrochlorothiazide. He has a carbon monoxide detector at home. Pt feels that testosterone has not made a difference since being off of it.    Most recent lab results (12/02/19) of CBC is as follows: all values are WNL except for RBC at 5.84, Hemoglobin at 18.9, HCT at 54.2, Creatinine at 1.39, GFR, Est Non African American at 50, GFR, Est African American at 27. 12/02/19 of Magnesium at 1.8 12/02/19 of Lipid Panel is as follows: all values are WNL except for: Triglycerides at 153 12/02/19 of Ferritin at 95 12/02/19 of Erythropoietin at 4.4  On review of systems, pt denies abdominal pain and any other symptoms.   On Social Hx the pt reports never smoked, 3 beers of alcohol a day   MEDICAL HISTORY:  Past Medical History:  Diagnosis Date  . BPH (benign prostatic hypertrophy)   . Elevated serum  immunoglobulin free light chains 04/28/2014  . Erythrocytosis 04/28/2014  . Exertional dyspnea    ETT myoview 5/11: 8 min, no ischemic ECG changes, stopped due to fatigue/ SOB. EF 60%, no ischemia or infarction. Echo 5/11 EF 60%, no significant valvular abnormalities, mild RV dilation and dysfunction, no complete TR doppler jet so PA pressure hard to gauge. 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 R heart cath 6/11: mean RA 10 mmHg, PA 25/18, mean PCWP 13 mmHg, CI 2.1  . Fibromyalgia   . GERD (gastroesophageal reflux disease)   . History of epididymitis   . History of head injury without skull fracture 1980   Concussion  . Hx of adenomatous polyp of colon 06/19/2007  . Hyperlipidemia    Myalgias with Lipitor  . Hypertension   . Hypogonadism male   . Myofascial pain syndrome   . Nephrolithiasis   . Prolapsed internal hemorrhoids   . Prostatitis    Recurrent  . Rheumatic fever    x3 at ages [age] and 35; No significant valvular abnormality on 5/11 echo  . Tendinitis 2000   Hx of left bicep  . Vitamin D deficiency      SURGICAL HISTORY: Past Surgical History:  Procedure Laterality Date  . BIH REPAIR  08/2005   Incarverated fat by mesh repair  . CARDIAC CATHETERIZATION  02/2010   Left and right  . FINGER SURGERY  2000   Repair of injury to right fifth finger  . HEEL SPUR SURGERY  1960   Bilateral  .  TONSILLECTOMY  1962     SOCIAL HISTORY: Social History   Socioeconomic History  . Marital status: Married    Spouse name: Not on file  . Number of children: Not on file  . Years of education: Not on file  . Highest education level: Not on file  Occupational History  . 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 Smoking status: Never Smoker  . Smokeless tobacco: Never Used  Substance and Sexual Activity  . Alcohol use: Yes    Alcohol/week: 12.0  standard drinks    Types: 12 Cans of beer per week    Comment: beer and liquoer  . Drug use: No  . Sexual activity: Not on file  Other Topics Concern  . Not on file  Social History Narrative   Married, lives in [place] Determinants of Health   Financial Resource Strain:   . Difficulty of Paying Living Expenses:   Food Insecurity:   . Worried About Charity fundraiser in the Last Year:   . Arboriculturist in the Last Year:   Transportation Needs:   . Film/video editor (Medical):   Marland Kitchen Lack of Transportation (Non-Medical):   Physical Activity:   . Days of Exercise per Week:   . Minutes of Exercise per Session:   Stress:   . Feeling of Stress :   Social Connections:   . Frequency of Communication with Friends and Family:   . Frequency of Social Gatherings with Friends and Family:   . Attends Religious Services:   . Active Member of Clubs or Organizations:   . Attends Archivist Meetings:   Marland Kitchen Marital Status:   Intimate Partner Violence:   . Fear of Current or Ex-Partner:   . Emotionally Abused:   Marland Kitchen Physically Abused:   . Sexually Abused:      FAMILY HISTORY: Family History  Problem Relation Age of Onset  . Hypertension Mother   . Breast cancer Mother   . Aortic stenosis Father 78  . Hypertension Other   . Aortic stenosis Other   . Breast cancer Other   . Diabetes Other   . Other Other        ASHD and epilepsy  . Stroke Neg Hx   . Colon cancer Neg Hx   . Colon polyps Neg Hx      ALLERGIES:   is allergic to atenolol; atorvastatin; darvocet [propoxyphene n-acetaminophen]; paroxetine; pregabalin; and propoxyphene hcl.   MEDICATIONS:  Current Outpatient Medications  Medication Sig Dispense Refill  . aspirin (ASPIR-LOW) 81 MG EC tablet Take 81 mg by mouth daily.      . B Complex-C (SUPER B COMPLEX PO) Take 1 tablet by mouth 2 (two) times daily.     Marland Kitchen buPROPion (WELLBUTRIN XL) 300 MG 24 hr tablet TAKE 1 TABLET DAILY FOR MOOD 90 tablet 3  .  cetirizine (ZYRTEC) 10 MG tablet Take 1 tablet (10 mg total) by mouth at bedtime. 90 tablet 1  . desoximetasone (TOPICORT) 0.25 % cream Apply 1 application topically as needed.    . diclofenac Sodium (VOLTAREN) 1 % GEL Apply 2 g topically as needed.    . famotidine (PEPCID) 40 MG tablet Take 1 tablet Daily for Indigestion & Heartburn 90 tablet 3  . fish oil-omega-3 fatty acids 1000 MG capsule Take 1 capsule by mouth 2 (  two) times daily.      . hydrochlorothiazide (HYDRODIURIL) 25 MG tablet Take 1 tablet Daily for BP & Fluid Retention 90 tablet 3  . ipratropium (ATROVENT) 0.06 % nasal spray Use 1 to 2 sprays each nostril 1 to 3 x /day for Nasal Congestion 45 mL 3  . losartan (COZAAR) 100 MG tablet Take 1 tablet Daily for BP 90 tablet 3  . Magnesium 250 MG TABS Take 1 tablet by mouth 2 (two) times daily.      . Multiple Vitamins-Minerals (STRESS B COMPLEX/ZINC) TABS Take 1 tablet by mouth daily.      Marland Kitchen OVER THE COUNTER MEDICATION Takes calcium, magnesium zinc vitamin 2 times a day.    . pantoprazole (PROTONIX) 40 MG tablet Take 1 tablet Daily for Indigestion & Heartburn 90 tablet 3  . rosuvastatin (CRESTOR) 40 MG tablet Take 1 tablet daily for Cholesterol 90 tablet 3  . sertraline (ZOLOFT) 100 MG tablet Take 1 tablet Daily for Mood 90 tablet 3  . vitamin C (ASCORBIC ACID) 500 MG tablet Take 500 mg by mouth daily.    Marland Kitchen VITAMIN D PO Take 5,000 Units by mouth 2 (two) times daily.    Marland Kitchen zinc gluconate 50 MG tablet Take 50 mg by mouth daily.    Marland Kitchen ezetimibe (ZETIA) 10 MG tablet Take 1 tablet Daily for Cholesterol (Patient not taking: Reported on 01/01/2020) 90 tablet 1  . mometasone (ELOCON) 0.1 % cream Apply to affected area 2 x /daily (Patient not taking: Reported on 01/01/2020) 45 g 3   No current facility-administered medications for this visit.     REVIEW OF SYSTEMS:   A 10+ POINT REVIEW OF SYSTEMS WAS OBTAINED including neurology, dermatology, psychiatry, cardiac, respiratory, lymph, extremities,  GI, GU, Musculoskeletal, constitutional, breasts, reproductive, HEENT.  All pertinent positives are noted in the HPI.  All others are negative.   PHYSICAL EXAMINATION: ECOG PERFORMANCE STATUS: 1 - Symptomatic but completely ambulatory  Vitals:   01/01/20 1142  BP: (!) 145/80  Pulse: 67  Resp: 18  Temp: 98.9 F (37.2 C)  SpO2: 97%   Filed Weights   01/01/20 1142  Weight: 206 lb 12.8 oz (93.8 kg)   Body mass index is 29.67 kg/m.   GENERAL:alert, in no acute distress and comfortable SKIN: no acute rashes, no significant lesions EYES: conjunctiva are pink and non-injected, sclera anicteric OROPHARYNX: MMM, no exudates, no oropharyngeal erythema or ulceration NECK: supple, no JVD LYMPH:  no palpable lymphadenopathy in the cervical, axillary or inguinal regions LUNGS: clear to auscultation b/l with normal respiratory effort HEART: regular rate & rhythm ABDOMEN:  normoactive bowel sounds , non tender, not distended. Extremity: no pedal edema PSYCH: alert & oriented x 3 with fluent speech NEURO: no focal motor/sensory deficits   LABORATORY DATA:  I have reviewed the data as listed  CBC Latest Ref Rng & Units 01/01/2020 12/02/2019 09/02/2019  WBC 4.0 - 10.5 K/uL 6.2 7.4 7.5  Hemoglobin 13.0 - 17.0 g/dL 17.1(H) 18.9(H) 16.9  Hematocrit 39.0 - 52.0 % 48.5 54.2(H) 47.1  Platelets 150 - 400 K/uL 155 154 161    CMP Latest Ref Rng & Units 01/01/2020 12/02/2019 09/02/2019  Glucose 70 - 99 mg/dL 119(H) 99 108(H)  BUN 8 - 23 mg/dL 24(H) 16 19  Creatinine 0.61 - 1.24 mg/dL 1.34(H) 1.39(H) 1.16  Sodium 135 - 145 mmol/L 141 138 140  Potassium 3.5 - 5.1 mmol/L 4.0 4.4 4.2  Chloride 98 - 111 mmol/L 106 104 103  CO2  22 - 32 mmol/L 28 29 28   Calcium 8.9 - 10.3 mg/dL 9.1 9.4 9.3  Total Protein 6.5 - 8.1 g/dL 6.7 6.4 6.5  Total Bilirubin 0.3 - 1.2 mg/dL 0.7 0.7 0.7  Alkaline Phos 38 - 126 U/L 57 - -  AST 15 - 41 U/L 32 24 21  ALT 0 - 44 U/L 48(H) 32 27   RADIOGRAPHIC STUDIES: I have  personally reviewed the radiological images as listed and agreed with the findings in the report. No results found.   ASSESSMENT & PLAN:  Curtis Price. is a 74 y.o. male with:  1. Polycythemia  Likely secondary to testosterone replacement. Hgb/hct 18.9/54.2   PLAN: -Discussed patient's most recent labs from 12/02/19, of CBC is as follows: all values are WNL except for RBC at 5.84, Hemoglobin at 18.9, HCT at 54.2, Creatinine at 1.39, GFR, Est Non African American at 50, GFR, Est African American at 51. -Discussed 12/02/19 of Magnesium at 1.8 -Discussed 12/02/19 of Lipid Panel is as follows: all values are WNL except for: Triglycerides at 153 -Discussed 12/02/19 of Ferritin at 95 -Discussed 12/02/19 of Erythropoietin at 4.4 -Advised on potential risk of  blood clots with elevated HCT -Discussed blood levels fluctuating- likely secondary problem (possibly testosterone) -Educated on polycythemia vera- not likely bone marrow problem -Educated on polycythemia- bone marrow problem vs adaptive response  -Educated on therapeutic phlebotomy for primary bone marrow problem -will check Jak2 [id number] mutation and Jak2 Exon 12 mutation to r/o Polycythemia vera -Educated on secondary factors (testosterone replacement) -Possibly chose to donate blood few times a year with testosterone or change lower dose of testosterone or stopping testosterone - will defer to PCP regarding this. -Recommends staying hydrated and drinking lots of water 2 liters a day -Recommends decreasing alcohol consumption  -Will get labs today see back via phone visit   [place] today Phone visit with Dr Irene Limbo in 2 weeks  The total time spent in the appt was 45 minutes and more than 50% was on counseling and direct patient cares.  All of the patient's questions were answered with apparent satisfaction. The patient knows to call the clinic with any problems, questions or concerns.   Curtis Lone MD [place] AAHIVMS [facility]  [facility] Hematology/Oncology Physician [facility]  (Office):       [phone] (Work cell):  [phone] (Fax):           [phone]  01/01/2020 12:57 PM  I, Dawayne Cirri am acting as a scribe for Dr. Sullivan Price.   .I have reviewed the above documentation for accuracy and completeness, and I agree with the above. Brunetta Genera MD

## 2020-01-01 ENCOUNTER — Other Ambulatory Visit: Payer: Self-pay

## 2020-01-01 ENCOUNTER — Inpatient Hospital Stay: Payer: Medicare Other | Attending: Hematology | Admitting: Hematology

## 2020-01-01 ENCOUNTER — Inpatient Hospital Stay: Payer: Medicare Other

## 2020-01-01 VITALS — BP 145/80 | HR 67 | Temp 98.9°F | Resp 18 | Ht 70.0 in | Wt 206.8 lb

## 2020-01-01 DIAGNOSIS — E559 Vitamin D deficiency, unspecified: Secondary | ICD-10-CM | POA: Diagnosis not present

## 2020-01-01 DIAGNOSIS — K219 Gastro-esophageal reflux disease without esophagitis: Secondary | ICD-10-CM | POA: Insufficient documentation

## 2020-01-01 DIAGNOSIS — Z79890 Hormone replacement therapy (postmenopausal): Secondary | ICD-10-CM | POA: Diagnosis not present

## 2020-01-01 DIAGNOSIS — Z7982 Long term (current) use of aspirin: Secondary | ICD-10-CM | POA: Insufficient documentation

## 2020-01-01 DIAGNOSIS — M797 Fibromyalgia: Secondary | ICD-10-CM | POA: Diagnosis not present

## 2020-01-01 DIAGNOSIS — I10 Essential (primary) hypertension: Secondary | ICD-10-CM | POA: Diagnosis not present

## 2020-01-01 DIAGNOSIS — D751 Secondary polycythemia: Secondary | ICD-10-CM | POA: Diagnosis present

## 2020-01-01 DIAGNOSIS — M7918 Myalgia, other site: Secondary | ICD-10-CM | POA: Diagnosis not present

## 2020-01-01 DIAGNOSIS — E785 Hyperlipidemia, unspecified: Secondary | ICD-10-CM | POA: Diagnosis not present

## 2020-01-01 DIAGNOSIS — Z791 Long term (current) use of non-steroidal anti-inflammatories (NSAID): Secondary | ICD-10-CM | POA: Diagnosis not present

## 2020-01-01 DIAGNOSIS — Z79899 Other long term (current) drug therapy: Secondary | ICD-10-CM | POA: Insufficient documentation

## 2020-01-01 DIAGNOSIS — N400 Enlarged prostate without lower urinary tract symptoms: Secondary | ICD-10-CM | POA: Diagnosis not present

## 2020-01-01 DIAGNOSIS — E291 Testicular hypofunction: Secondary | ICD-10-CM | POA: Diagnosis not present

## 2020-01-01 DIAGNOSIS — Z7951 Long term (current) use of inhaled steroids: Secondary | ICD-10-CM | POA: Insufficient documentation

## 2020-01-01 LAB — CBC WITH DIFFERENTIAL/PLATELET
Abs Immature Granulocytes: 0.04 10*3/uL (ref 0.00–0.07)
Basophils Absolute: 0 10*3/uL (ref 0.0–0.1)
Basophils Relative: 1 %
Eosinophils Absolute: 0.2 10*3/uL (ref 0.0–0.5)
Eosinophils Relative: 4 %
HCT: 48.5 % (ref 39.0–52.0)
Hemoglobin: 17.1 g/dL — ABNORMAL HIGH (ref 13.0–17.0)
Immature Granulocytes: 1 %
Lymphocytes Relative: 18 %
Lymphs Abs: 1.1 10*3/uL (ref 0.7–4.0)
MCH: 31.8 pg (ref 26.0–34.0)
MCHC: 35.3 g/dL (ref 30.0–36.0)
MCV: 90.1 fL (ref 80.0–100.0)
Monocytes Absolute: 0.6 10*3/uL (ref 0.1–1.0)
Monocytes Relative: 10 %
Neutro Abs: 4.1 10*3/uL (ref 1.7–7.7)
Neutrophils Relative %: 66 %
Platelets: 155 10*3/uL (ref 150–400)
RBC: 5.38 MIL/uL (ref 4.22–5.81)
RDW: 12.6 % (ref 11.5–15.5)
WBC: 6.2 10*3/uL (ref 4.0–10.5)
nRBC: 0 % (ref 0.0–0.2)

## 2020-01-01 LAB — CMP (CANCER CENTER ONLY)
ALT: 48 U/L — ABNORMAL HIGH (ref 0–44)
AST: 32 U/L (ref 15–41)
Albumin: 3.9 g/dL (ref 3.5–5.0)
Alkaline Phosphatase: 57 U/L (ref 38–126)
Anion gap: 7 (ref 5–15)
BUN: 24 mg/dL — ABNORMAL HIGH (ref 8–23)
CO2: 28 mmol/L (ref 22–32)
Calcium: 9.1 mg/dL (ref 8.9–10.3)
Chloride: 106 mmol/L (ref 98–111)
Creatinine: 1.34 mg/dL — ABNORMAL HIGH (ref 0.61–1.24)
GFR, Est AFR Am: >60 mL/min (ref >60)
GFR, Estimated: 53 mL/min — ABNORMAL LOW (ref >60)
Glucose, Bld: 119 mg/dL — ABNORMAL HIGH (ref 70–99)
Potassium: 4 mmol/L (ref 3.5–5.1)
Sodium: 141 mmol/L (ref 135–145)
Total Bilirubin: 0.7 mg/dL (ref 0.3–1.2)
Total Protein: 6.7 g/dL (ref 6.5–8.1)

## 2020-01-01 NOTE — Patient Instructions (Signed)
Thank you for choosing [facility] to provide your oncology and hematology care.   Should you have questions after your visit to the [facility] ([facility]), please contact this office at [phone] between 8:30 AM and 4:30 PM.  Voice mails left after 4:00 PM may not be returned until the following business day.  Calls received after 4:30 PM will be answered by an off-site Nurse Triage Line.    Prescription Refills:  Please have your pharmacy contact us directly for most prescription requests.  Contact the office directly for refills of narcotics (pain medications). Allow 48-72 hours for refills.  Appointments: Please contact the [facility] scheduling department [phone] for questions regarding [facility] appointment scheduling.  Contact the schedulers with any scheduling changes so that your appointment can be rescheduled in a timely manner.   Central Scheduling for [place] [phone] - Call to schedule procedures such as PET scans, CT scans, MRI, Ultrasound, etc.  To afford each patient quality time with our providers, please arrive 30 minutes before your scheduled appointment time.  If you arrive late for your appointment, you may be asked to reschedule.  We strive to give you quality time with our providers, and arriving late affects you and other patients whose appointments are after yours. If you are a no show for multiple scheduled visits, you may be dismissed from the clinic at the providers discretion.     Resources: [facility] Social Workers [phone] for additional information on assistance programs or assistance connecting with community support programs   [place] DSS  [phone]: Information regarding food stamps, Medicaid, and utility assistance [place] [phone]   [place] [employer]'s shared-ride transportation service for eligible riders who have a disability that prevents them from riding the fixed route bus.   Medicare Rights Center  800-333-4114 Helps people with Medicare understand their rights and benefits, navigate the Medicare system, and secure the quality healthcare they deserve [employer] 800-227-2345 Assists patients locate various types of support and financial assistance Cancer Care: 1-800-813-HOPE (4673) Provides financial assistance, online support groups, medication/co-pay assistance.   Transportation Assistance for appointments at [facility]: Transportation Coordinator [phone]  Again, thank you for choosing [facility] for your care.       

## 2020-01-15 NOTE — Progress Notes (Signed)
HEMATOLOGY/ONCOLOGY CLINIC NOTE  Date of Service: 01/16/2020  Patient Care Team: [name], MD as PCP - General (Internal Medicine) Larey Dresser, MD as Consulting Physician (Cardiology) Sable Feil, MD as Consulting Physician (Gastroenterology) Bensimhon, Shaune Pascal, MD as Consulting Physician (Cardiology)  REFERRING PHYSICIAN: [name], MD  CHIEF COMPLAINTS/PURPOSE OF CONSULTATION:  Secondary Polycythemia   HISTORY OF PRESENTING ILLNESS:  I connected with Curtis Price. on 01/16/20 at 11:40 AM EDT and verified that I am speaking with the correct person using two identifiers.   I discussed the limitations, risks, security and privacy concerns of performing an evaluation and management service by telemedicine and the availability of in-person appointments. I also discussed with the patient that there may be a patient responsible charge related to this service. The patient expressed understanding and agreed to proceed.   Other persons participating in the visit and their role in the encounter:     -Dawayne Cirri, Medical Scribe   Patient's location: Home  Provider's location: [place] [employer]. is a wonderful 74 y.o. 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 The patient's last visit with [place] was on 01/01/20. The pt reports that he is doing well overall.  The pt reports he is good. Pt currently feels no difference not taking testosterone.   Lab results today (01/01/20) of CBC w/diff and CMP is as follows: all values are WNL except for Hemoglobin at 17.1, Glucose at 118, BUN at 24, Creatinine at 1.34, ALT at 48, GFR, Est Non Af Am at 53   MEDICAL HISTORY:  Past Medical History:  Diagnosis Date  . BPH (benign prostatic hypertrophy)   . 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 Erythrocytosis 04/28/2014  . Exertional dyspnea    ETT myoview  5/11: 8 min, no ischemic ECG changes, stopped due to fatigue/ SOB. EF 60%, no ischemia or infarction. Echo 5/11 EF 60%, no significant valvular abnormalities, mild RV dilation and dysfunction, no complete TR doppler jet so PA pressure hard to gauge. 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 R heart cath 6/11: mean RA 10 mmHg, PA 25/18, mean PCWP 13 mmHg, CI 2.1  . Fibromyalgia   . GERD (gastroesophageal reflux disease)   . History of epididymitis   . History of head injury without skull fracture 1980   Concussion  . Hx of adenomatous polyp of colon 06/19/2007  . Hyperlipidemia    Myalgias with Lipitor  . Hypertension   . Hypogonadism male   . Myofascial pain syndrome   . Nephrolithiasis   . Prolapsed internal hemorrhoids   . Prostatitis    Recurrent  . Rheumatic fever    x3 at ages [age] and 44; No significant valvular abnormality on 5/11 echo  . Tendinitis 2000   Hx of left bicep  . Vitamin D deficiency      SURGICAL HISTORY: Past Surgical History:  Procedure Laterality Date  . BIH REPAIR  08/2005   Incarverated fat by mesh repair  . CARDIAC CATHETERIZATION  02/2010   Left and right  . FINGER SURGERY  2000   Repair of injury to right fifth finger  . HEEL SPUR SURGERY  1960   Bilateral  . TONSILLECTOMY  1962     SOCIAL HISTORY: Social History   Socioeconomic History  . Marital status: Married    Spouse name: Not  on file  . Number of children: Not on file  . Years of education: Not on file  . Highest education level: Not on file  Occupational History  . 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 Smoking status: Never Smoker  . Smokeless tobacco: Never Used  Substance and Sexual Activity  . Alcohol use: Yes    Alcohol/week: 12.0 standard drinks    Types: 12 Cans of beer per week    Comment: beer and liquoer  . Drug use: No  . Sexual  activity: Not on file  Other Topics Concern  . Not on file  Social History Narrative   Married, lives in [place] Determinants of Health   Financial Resource Strain:   . Difficulty of Paying Living Expenses:   Food Insecurity:   . Worried About Charity fundraiser in the Last Year:   . Arboriculturist in the Last Year:   Transportation Needs:   . Film/video editor (Medical):   Marland Kitchen Lack of Transportation (Non-Medical):   Physical Activity:   . Days of Exercise per Week:   . Minutes of Exercise per Session:   Stress:   . Feeling of Stress :   Social Connections:   . Frequency of Communication with Friends and Family:   . Frequency of Social Gatherings with Friends and Family:   . Attends Religious Services:   . Active Member of Clubs or Organizations:   . Attends Archivist Meetings:   Marland Kitchen Marital Status:   Intimate Partner Violence:   . Fear of Current or Ex-Partner:   . Emotionally Abused:   Marland Kitchen Physically Abused:   . Sexually Abused:      FAMILY HISTORY: Family History  Problem Relation Age of Onset  . Hypertension Mother   . Breast cancer Mother   . Aortic stenosis Father 8  . Hypertension Other   . Aortic stenosis Other   . Breast cancer Other   . Diabetes Other   . Other Other        ASHD and epilepsy  . Stroke Neg Hx   . Colon cancer Neg Hx   . Colon polyps Neg Hx      ALLERGIES:   is allergic to atenolol; atorvastatin; darvocet [propoxyphene n-acetaminophen]; paroxetine; pregabalin; and propoxyphene hcl.   MEDICATIONS:  Current Outpatient Medications  Medication Sig Dispense Refill  . aspirin (ASPIR-LOW) 81 MG EC tablet Take 81 mg by mouth daily.      . B Complex-C (SUPER B COMPLEX PO) Take 1 tablet by mouth 2 (two) times daily.     Marland Kitchen buPROPion (WELLBUTRIN XL) 300 MG 24 hr tablet TAKE 1 TABLET DAILY FOR MOOD 90 tablet 3  . cetirizine (ZYRTEC) 10 MG tablet Take 1 tablet (10 mg total) by mouth at bedtime. 90 tablet 1  .  desoximetasone (TOPICORT) 0.25 % cream Apply 1 application topically as needed.    . diclofenac Sodium (VOLTAREN) 1 % GEL Apply 2 g topically as needed.    . ezetimibe (ZETIA) 10 MG tablet Take 1 tablet Daily for Cholesterol (Patient not taking: Reported on 01/01/2020) 90 tablet 1  . famotidine (PEPCID) 40 MG tablet Take 1 tablet Daily for Indigestion & Heartburn 90 tablet 3  . fish oil-omega-3 fatty acids 1000 MG capsule Take 1 capsule by mouth 2 (two) times daily.      Marland Kitchen  hydrochlorothiazide (HYDRODIURIL) 25 MG tablet Take 1 tablet Daily for BP & Fluid Retention 90 tablet 3  . ipratropium (ATROVENT) 0.06 % nasal spray Use 1 to 2 sprays each nostril 1 to 3 x /day for Nasal Congestion 45 mL 3  . losartan (COZAAR) 100 MG tablet Take 1 tablet Daily for BP 90 tablet 3  . Magnesium 250 MG TABS Take 1 tablet by mouth 2 (two) times daily.      . mometasone (ELOCON) 0.1 % cream Apply to affected area 2 x /daily (Patient not taking: Reported on 01/01/2020) 45 g 3  . Multiple Vitamins-Minerals (STRESS B COMPLEX/ZINC) TABS Take 1 tablet by mouth daily.      Marland Kitchen OVER THE COUNTER MEDICATION Takes calcium, magnesium zinc vitamin 2 times a day.    . pantoprazole (PROTONIX) 40 MG tablet Take 1 tablet Daily for Indigestion & Heartburn 90 tablet 3  . rosuvastatin (CRESTOR) 40 MG tablet Take 1 tablet daily for Cholesterol 90 tablet 3  . sertraline (ZOLOFT) 100 MG tablet Take 1 tablet Daily for Mood 90 tablet 3  . vitamin C (ASCORBIC ACID) 500 MG tablet Take 500 mg by mouth daily.    Marland Kitchen VITAMIN D PO Take 5,000 Units by mouth 2 (two) times daily.    Marland Kitchen zinc gluconate 50 MG tablet Take 50 mg by mouth daily.     No current facility-administered medications for this visit.     REVIEW OF SYSTEMS:   A 10+ POINT REVIEW OF SYSTEMS WAS OBTAINED including neurology, dermatology, psychiatry, cardiac, respiratory, lymph, extremities, GI, GU, Musculoskeletal, constitutional, breasts, reproductive, HEENT.  All pertinent positives are  noted in the HPI.  All others are negative.   PHYSICAL EXAMINATION:  Telehealth Visit   LABORATORY DATA:  I have reviewed the data as listed  CBC Latest Ref Rng & Units 01/01/2020 12/02/2019 09/02/2019  WBC 4.0 - 10.5 K/uL 6.2 7.4 7.5  Hemoglobin 13.0 - 17.0 g/dL 17.1(H) 18.9(H) 16.9  Hematocrit 39.0 - 52.0 % 48.5 54.2(H) 47.1  Platelets 150 - 400 K/uL 155 154 161    CMP Latest Ref Rng & Units 01/01/2020 12/02/2019 09/02/2019  Glucose 70 - 99 mg/dL 119(H) 99 108(H)  BUN 8 - 23 mg/dL 24(H) 16 19  Creatinine 0.61 - 1.24 mg/dL 1.34(H) 1.39(H) 1.16  Sodium 135 - 145 mmol/L 141 138 140  Potassium 3.5 - 5.1 mmol/L 4.0 4.4 4.2  Chloride 98 - 111 mmol/L 106 104 103  CO2 22 - 32 mmol/L 28 29 28   Calcium 8.9 - 10.3 mg/dL 9.1 9.4 9.3  Total Protein 6.5 - 8.1 g/dL 6.7 6.4 6.5  Total Bilirubin 0.3 - 1.2 mg/dL 0.7 0.7 0.7  Alkaline Phos 38 - 126 U/L 57 - -  AST 15 - 41 U/L 32 24 21  ALT 0 - 44 U/L 48(H) 32 27   RADIOGRAPHIC STUDIES: I have personally reviewed the radiological images as listed and agreed with the findings in the report. No results found.   ASSESSMENT & PLAN:  Curtis Price. is a 74 y.o. male with:  1. Polycythemia  Likely secondary to testosterone replacement. Hgb/hct 18.9/54.2  PLAN: -Discussed pt labwork today, 01/01/20; f CBC w/diff and CMP is as follows: all values are WNL except for Hemoglobin at 17.1, Glucose at 118, BUN at 24, Creatinine at 1.34, ALT at 48, GFR, Est Non Af Am at 53 01/01/20 of JAK2 (including V617 and Exon 12), MPL, and CALR-Next Generation Sequencing  Negative -Not suggestive primary bone  marrow problem-negative for polycythemia vera  -Advised blood counts normalized while pt was off testosterone  -Advised may need to donate blood if staying on testosterone to keep HCT<50 -Recommends staying hydrated and drinking lots of water 2 liters a day -Recommends decreasing alcohol consumption  -RTC if needed    The total time spent in the appt was 20  minutes and more than 50% was on counseling and direct patient cares.  All of the patient's questions were answered with apparent satisfaction. The patient knows to call the clinic with any problems, questions or concerns.  Sullivan Lone MD MS AAHIVMS [facility] Hematology/Oncology Physician [facility]  (Office):       [phone] (Work cell):  [phone] (Fax):           [phone]  01/16/2020 1:43 PM  I, Dawayne Cirri am acting as a Education administrator for Dr. Sullivan Lone.   .I have reviewed the above documentation for accuracy and completeness, and I agree with the above. Brunetta Genera MD

## 2020-01-16 ENCOUNTER — Inpatient Hospital Stay (HOSPITAL_BASED_OUTPATIENT_CLINIC_OR_DEPARTMENT_OTHER): Payer: Medicare Other | Admitting: Hematology

## 2020-01-16 DIAGNOSIS — D751 Secondary polycythemia: Secondary | ICD-10-CM | POA: Diagnosis not present

## 2020-02-04 ENCOUNTER — Other Ambulatory Visit: Payer: Self-pay | Admitting: Internal Medicine

## 2020-02-04 DIAGNOSIS — I10 Essential (primary) hypertension: Secondary | ICD-10-CM

## 2020-02-05 ENCOUNTER — Other Ambulatory Visit: Payer: Self-pay | Admitting: *Deleted

## 2020-02-05 DIAGNOSIS — E782 Mixed hyperlipidemia: Secondary | ICD-10-CM

## 2020-02-05 DIAGNOSIS — K219 Gastro-esophageal reflux disease without esophagitis: Secondary | ICD-10-CM

## 2020-02-05 DIAGNOSIS — I10 Essential (primary) hypertension: Secondary | ICD-10-CM

## 2020-02-05 MED ORDER — EZETIMIBE 10 MG PO TABS: ORAL_TABLET | ORAL | 3 refills | Status: DC

## 2020-02-05 MED ORDER — LOSARTAN POTASSIUM 100 MG PO TABS: ORAL_TABLET | ORAL | 3 refills | Status: DC

## 2020-02-05 MED ORDER — HYDROCHLOROTHIAZIDE 25 MG PO TABS: ORAL_TABLET | ORAL | 3 refills | Status: DC

## 2020-02-05 MED ORDER — PANTOPRAZOLE SODIUM 40 MG PO TBEC: DELAYED_RELEASE_TABLET | ORAL | 3 refills | Status: DC

## 2020-02-05 MED ORDER — ROSUVASTATIN CALCIUM 40 MG PO TABS: ORAL_TABLET | ORAL | 3 refills | Status: DC

## 2020-02-25 ENCOUNTER — Telehealth: Payer: Self-pay | Admitting: *Deleted

## 2020-02-25 NOTE — Telephone Encounter (Signed)
Pantoprazole approved 02/25/2020 to 02/24/2021. Approval faxed to [facility] in [place], [place].

## 2020-03-04 LAB — JAK2 (INCLUDING V617F AND EXON 12), MPL,& CALR-NEXT GEN SEQ

## 2020-03-18 ENCOUNTER — Encounter: Payer: Self-pay | Admitting: Internal Medicine

## 2020-03-18 DIAGNOSIS — I10 Essential (primary) hypertension: Secondary | ICD-10-CM | POA: Diagnosis not present

## 2020-03-18 NOTE — Patient Instructions (Signed)

## 2020-03-18 NOTE — Progress Notes (Signed)
Annual  Screening/Preventative Visit  & Comprehensive Evaluation & Examination     This very nice 74 y.o.  MWM presents for a Screening /Preventative Visit & comprehensive evaluation and management of multiple medical co-morbidities.  Patient has been followed for HTN, HLD, Prediabetes, testosterone Deficiency and Vitamin D Deficiency. Patient has GERD controlled on his meds.     HTN predates circa 1994.  Patient's BP has been controlled at home. In 2011, he had a negative Heart Cath.   Today's BP is at goal - 128/84. Patient denies any cardiac symptoms as chest pain, palpitations, shortness of breath, dizziness or ankle swelling.     Patient's hyperlipidemia is controlled with diet and Rosuvastatin / Ezetimibe. Patient denies myalgias or other medication SE's. Last lipids were at goal:  Lab Results  Component Value Date   CHOL 130 12/02/2019   HDL 58 12/02/2019   LDLCALC 48 12/02/2019   LDLDIRECT 151.0 02/10/2010   TRIG 153 (H) 12/02/2019   CHOLHDL 2.2 12/02/2019       Patient has hx/o abnormal glucose & is followed expectantly for glucose intolerance and patient denies reactive hypoglycemic symptoms, visual blurring, diabetic polys or paresthesias. Last A1c was Normal & at goal:  Lab Results  Component Value Date   HGBA1C 5.0 09/02/2019                                           Patient has hx/o Testosterone Deficiency (2013) and has been on replacement therapy (gel) - but has since stopped replacement on Dr Grier Mitts advice because of secondary polycythemia attributed to his testosterone replacement.       Finally, patient has history of Vitamin D Deficiency ("38" / 2016) and last vitamin D was at goal:  Lab Results  Component Value Date   VD25OH 93 09/02/2019    Current Outpatient Medications on File Prior to Visit  Medication Sig   aspirin (ASPIR-LOW) 81 MG EC tablet Take 81 mg by mouth daily.     B Complex-C (SUPER B COMPLEX PO) Take 1 tablet by mouth 2 (two) times  daily.    buPROPion (WELLBUTRIN XL) 300 MG 24 hr tablet TAKE 1 TABLET DAILY FOR MOOD   cetirizine (ZYRTEC) 10 MG tablet Take 1 tablet (10 mg total) by mouth at bedtime.   desoximetasone (TOPICORT) 0.25 % cream Apply 1 application topically as needed.   diclofenac Sodium (VOLTAREN) 1 % GEL Apply 2 g topically as needed.   ezetimibe (ZETIA) 10 MG tablet Take 1 tablet Daily for Cholesterol   famotidine (PEPCID) 40 MG tablet Take 1 tablet Daily for Indigestion & Heartburn   fish oil-omega-3 fatty acids 1000 MG capsule Take 1 capsule by mouth 2 (two) times daily.     hydrochlorothiazide (HYDRODIURIL) 25 MG tablet TAKE 1 TABLET BY MOUTH ONCE DAILY FOR BLOOD PRESSURE AND FLUID RETENTION   ipratropium (ATROVENT) 0.06 % nasal spray Use 1 to 2 sprays each nostril 1 to 3 x /day for Nasal Congestion   losartan (COZAAR) 100 MG tablet Take 1 tablet Daily for BP   Magnesium 250 MG TABS Take 1 tablet by mouth 2 (two) times daily.     mometasone (ELOCON) 0.1 % cream Apply to affected area 2 x /daily   Multiple Vitamins-Minerals (STRESS B COMPLEX/ZINC) TABS Take 1 tablet by mouth daily.     OVER THE COUNTER MEDICATION Takes calcium, magnesium  zinc vitamin 2 times a day.   pantoprazole (PROTONIX) 40 MG tablet Take 1 tablet Daily for Indigestion & Heartburn   rosuvastatin (CRESTOR) 40 MG tablet Take 1 tablet daily for Cholesterol   sertraline (ZOLOFT) 100 MG tablet Take 1 tablet Daily for Mood   vitamin C (ASCORBIC ACID) 500 MG tablet Take 500 mg by mouth daily.   VITAMIN D PO Take 5,000 Units by mouth 2 (two) times daily.   zinc gluconate 50 MG tablet Take 50 mg by mouth daily.   No current facility-administered medications on file prior to visit.   Allergies  Allergen Reactions   Atenolol    Atorvastatin    Darvocet [Propoxyphene N-Acetaminophen]    Paroxetine    Pregabalin    Propoxyphene Hcl    Past Medical History:  Diagnosis Date   BPH (benign prostatic hypertrophy)     Elevated serum immunoglobulin free light chains 04/28/2014   Erythrocytosis 04/28/2014   Exertional dyspnea    ETT myoview 5/11: 8 min, no ischemic ECG changes, stopped due to fatigue/ SOB. EF 60%, no ischemia or infarction. Echo 5/11 EF 60%, no significant valvular abnormalities, mild RV dilation and dysfunction, no complete TR doppler jet so PA pressure hard to gauge. 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 R heart cath 6/11: mean RA 10 mmHg, PA 25/18, mean PCWP 13 mmHg, CI 2.1   Fibromyalgia    GERD (gastroesophageal reflux disease)    History of epididymitis    History of head injury without skull fracture 1980   Concussion   Hx of adenomatous polyp of colon 06/19/2007   Hyperlipidemia    Myalgias with Lipitor   Hypertension    Hypogonadism male    Myofascial pain syndrome    Nephrolithiasis    Prolapsed internal hemorrhoids    Prostatitis    Recurrent   Rheumatic fever    x3 at ages [age] and 5; No significant valvular abnormality on 5/11 echo   Tendinitis 2000   Hx of left bicep   Vitamin D deficiency    Health Maintenance  Topic Date Due   Hepatitis C Screening  Never done   INFLUENZA VACCINE  04/25/2020   COLONOSCOPY  06/08/2020   TETANUS/TDAP  12/01/2029   COVID-19 Vaccine  Completed   PNA vac Low Risk Adult  Completed   Immunization History  Administered Date(s) Administered   Influenza, High Dose Seasonal PF 08/13/2014, 08/31/2015   Janssen (J&J) SARS-COV-2 Vaccination 02/24/2020   Pneumococcal Conjugate-13 08/13/2014   Pneumococcal Polysaccharide-23 07/12/2012   Tdap 04/25/2009, 12/02/2019   Zoster 07/27/2007   Last Colon -  06/09/2015- Dr Carlean Purl recc f/u 5 yr Sept 2021.  Past Surgical History:  Procedure Laterality Date   BIH REPAIR  08/2005   Incarverated fat by mesh repair   CARDIAC CATHETERIZATION  02/2010   Left and right   FINGER SURGERY  2000   Repair of injury to right fifth finger    HEEL SPUR SURGERY  1960   Bilateral   TONSILLECTOMY  1962   Family History  Problem Relation Age of Onset   Hypertension Mother    Breast cancer Mother    Aortic stenosis Father 45   Hypertension Other    Aortic stenosis Other    Breast cancer Other    Diabetes Other    Other Other        ASHD and epilepsy   Stroke Neg Hx    Colon cancer Neg Hx  Colon polyps Neg Hx    Social History   Socioeconomic History   Marital status: Married    Spouse name: Katharine Look   Number of children: Not on file  Occupational History   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 Does c/o fatigue. Eyes: Denies redness, blurred vision, diplopia, discharge, itchy or watery eyes.  ENT: Denies discharge, congestion, post nasal drip, epistaxis, sore throat, earache, hearing loss, dental pain, Tinnitus, Vertigo, Sinus pain or snoring.  Cardio: Denies chest pain, palpitations, irregular heartbeat, syncope, dyspnea, diaphoresis, orthopnea, PND, claudication or edema Respiratory: denies cough, dyspnea, DOE, pleurisy, hoarseness, laryngitis or wheezing.  Gastrointestinal: Denies dysphagia, heartburn, reflux, water brash, pain, cramps, nausea, vomiting, bloating, diarrhea, constipation, hematemesis, melena, hematochezia, jaundice or hemorrhoids Genitourinary: Denies dysuria, frequency, discharge, hematuria or flank pain. Has urgency, nocturia x 2-3 & occasional hesitancy. Musculoskeletal: Denies arthralgia,  myalgia, stiffness, Jt. Swelling, pain, limp or strain/sprain. Denies Falls. Skin: Denies puritis, rash, hives, warts, acne, eczema or change in skin lesion Neuro: No weakness, tremor, incoordination, spasms, paresthesia or pain Psychiatric: Denies confusion, memory loss or sensory loss. Denies Depression. Endocrine: Denies change in weight, skin, hair change, nocturia, and paresthesia, diabetic polys, visual blurring or hyper / hypo glycemic episodes.  Heme/Lymph: No excessive bleeding, bruising or enlarged lymph nodes.  Physical Exam  BP 128/84    Pulse 72    Temp (!) 97.4 F (36.3 C)    Resp 16    Ht 5\' 10"  (1.778 m)    Wt 210 lb 9.6 oz (95.5 kg)    BMI 30.22 kg/m   General Appearance: Well nourished and well groomed and in no apparent distress.  Eyes: PERRLA, EOMs, conjunctiva no swelling or erythema, normal fundi and vessels. Sinuses: No frontal/maxillary tenderness ENT/Mouth: EACs patent / TMs  nl. Nares clear without erythema, swelling, mucoid exudates. Oral hygiene is good. No erythema, swelling, or exudate. Tongue normal, non-obstructing. Tonsils not swollen or erythematous. Hearing normal.  Neck: Supple, thyroid not palpable. No bruits, nodes or JVD. Respiratory: Respiratory effort normal.  BS equal and clear bilateral without rales, rhonci, wheezing or stridor. Cardio: Heart sounds are normal with regular rate and rhythm and no murmurs, rubs or gallops. Peripheral pulses are normal and equal bilaterally without edema. No aortic or femoral bruits. Chest: symmetric with normal excursions and percussion.  Abdomen: Soft, with Nl bowel sounds. Nontender, no guarding, rebound, hernias, masses, or organomegaly.  Lymphatics: Non tender without lymphadenopathy.  Musculoskeletal: Full ROM all peripheral extremities, joint stability, 5/5 strength, and normal gait. Skin: Warm and dry without rashes, lesions, cyanosis, clubbing or  ecchymosis.  Neuro: Cranial nerves intact, reflexes equal  bilaterally. Normal muscle tone, no cerebellar symptoms. Sensation intact.  Pysch: Alert and oriented X 3 with normal affect, insight and judgment appropriate.   Assessment and Plan  1. Annual Preventative/Screening Exam   2. Essential hypertension  - EKG 12-Lead - [place], RETROPERITNL ABD,  LTD -  Urinalysis, Routine w reflex microscopic - Microalbumin / creatinine urine ratio - CBC with Differential/Platelet - COMPLETE METABOLIC PANEL WITH GFR - Magnesium - TSH  3. Hyperlipidemia, mixed  - Lipid panel - TSH  4. Abnormal glucose  - Hemoglobin A1c - Insulin, random  5. Vitamin D deficiency  - VITAMIN D 25 Hydroxy   6. Gastroesophageal reflux disease  - CBC with Differential/Platelet  7. Testosterone Deficiency  - Testosterone  8. BPH with urinary obstruction  - PSA  9. Prostate cancer screening  - PSA  10. Screening for ischemic heart disease  - EKG 12-Lead  11. FHx: heart disease  - EKG 12-Lead  12. Screening for AAA (aortic abdominal aneurysm)  - EKG 12-Lead  13. Medication management  - Urinalysis, Routine w reflex microscopic - Microalbumin / creatinine urine ratio - CBC with Differential/Platelet - COMPLETE METABOLIC PANEL WITH GFR - Magnesium - Lipid panel - TSH - Hemoglobin A1c - Insulin, random - VITAMIN D 25 Hydroxyl - Testosterone         Patient was counseled in prudent diet, weight control to achieve/maintain BMI less than 25, BP monitoring, regular exercise and medications as discussed.  Discussed med effects and SE's. Routine screening labs and tests as requested with regular follow-up as recommended. Over 40 minutes of exam, counseling, chart review and high complex critical decision making was performed   Kirtland Bouchard, MD

## 2020-03-19 ENCOUNTER — Other Ambulatory Visit: Payer: Self-pay

## 2020-03-19 ENCOUNTER — Ambulatory Visit (INDEPENDENT_AMBULATORY_CARE_PROVIDER_SITE_OTHER): Payer: Medicare Other | Admitting: Internal Medicine

## 2020-03-19 VITALS — BP 128/84 | HR 72 | Temp 97.4°F | Resp 16 | Ht 70.0 in | Wt 210.6 lb

## 2020-03-19 DIAGNOSIS — Z0001 Encounter for general adult medical examination with abnormal findings: Secondary | ICD-10-CM

## 2020-03-19 DIAGNOSIS — R7309 Other abnormal glucose: Secondary | ICD-10-CM

## 2020-03-19 DIAGNOSIS — Z8249 Family history of ischemic heart disease and other diseases of the circulatory system: Secondary | ICD-10-CM

## 2020-03-19 DIAGNOSIS — Z136 Encounter for screening for cardiovascular disorders: Secondary | ICD-10-CM

## 2020-03-19 DIAGNOSIS — N401 Enlarged prostate with lower urinary tract symptoms: Secondary | ICD-10-CM

## 2020-03-19 DIAGNOSIS — E559 Vitamin D deficiency, unspecified: Secondary | ICD-10-CM

## 2020-03-19 DIAGNOSIS — I10 Essential (primary) hypertension: Secondary | ICD-10-CM

## 2020-03-19 DIAGNOSIS — N138 Other obstructive and reflux uropathy: Secondary | ICD-10-CM

## 2020-03-19 DIAGNOSIS — E782 Mixed hyperlipidemia: Secondary | ICD-10-CM

## 2020-03-19 DIAGNOSIS — K219 Gastro-esophageal reflux disease without esophagitis: Secondary | ICD-10-CM

## 2020-03-19 DIAGNOSIS — Z125 Encounter for screening for malignant neoplasm of prostate: Secondary | ICD-10-CM

## 2020-03-19 DIAGNOSIS — Z79899 Other long term (current) drug therapy: Secondary | ICD-10-CM

## 2020-03-19 DIAGNOSIS — Z0000 Encounter for general adult medical examination without abnormal findings: Secondary | ICD-10-CM | POA: Diagnosis not present

## 2020-03-19 DIAGNOSIS — D485 Neoplasm of uncertain behavior of skin: Secondary | ICD-10-CM

## 2020-03-19 MED ORDER — DESOXIMETASONE 0.25 % EX CREA: 1.0000 "application " | TOPICAL_CREAM | CUTANEOUS | 1 refills | Status: DC | PRN

## 2020-03-20 NOTE — Progress Notes (Signed)
=============================================================  -   PSA - very low - Great  =============================================================  -  Total Chol = 116  and LDL Chol =   28   - Both Excellent   - Very low risk for Heart Attack  / Stroke =============================================================  -  A1c - Normal - Great - No Diabetes  =============================================================  - Vitamin D = 72 - Excellent  =============================================================  - Testosterone - Low - Be sure to take your Zinc 50 mg  =============================================================

## 2020-03-22 LAB — LIPID PANEL
Cholesterol: 116 mg/dL (ref <200)
HDL: 68 mg/dL (ref >=40)
LDL Cholesterol (Calc): 28 mg/dL (calc)
Non-HDL Cholesterol (Calc): 48 mg/dL (calc) (ref <130)
Total CHOL/HDL Ratio: 1.7 (calc) (ref <5.0)
Triglycerides: 118 mg/dL (ref <150)

## 2020-03-22 LAB — CBC WITH DIFFERENTIAL/PLATELET
Absolute Monocytes: 713 cells/uL (ref 200–950)
Basophils Absolute: 40 cells/uL (ref 0–200)
Basophils Relative: 0.6 %
Eosinophils Absolute: 363 cells/uL (ref 15–500)
Eosinophils Relative: 5.5 %
HCT: 43.1 % (ref 38.5–50.0)
Hemoglobin: 15.3 g/dL (ref 13.2–17.1)
Lymphs Abs: 977 cells/uL (ref 850–3900)
MCH: 33 pg (ref 27.0–33.0)
MCHC: 35.5 g/dL (ref 32.0–36.0)
MCV: 93.1 fL (ref 80.0–100.0)
MPV: 10.9 fL (ref 7.5–12.5)
Monocytes Relative: 10.8 %
Neutro Abs: 4508 cells/uL (ref 1500–7800)
Neutrophils Relative %: 68.3 %
Platelets: 158 10*3/uL (ref 140–400)
RBC: 4.63 10*6/uL (ref 4.20–5.80)
RDW: 13.1 % (ref 11.0–15.0)
Total Lymphocyte: 14.8 %
WBC: 6.6 10*3/uL (ref 3.8–10.8)

## 2020-03-22 LAB — PSA: PSA: 0.5 ng/mL (ref <=4.0)

## 2020-03-22 LAB — MICROALBUMIN / CREATININE URINE RATIO
Creatinine, Urine: 85 mg/dL (ref 20–320)
Microalb Creat Ratio: 2 mcg/mg creat (ref <30)
Microalb, Ur: 0.2 mg/dL

## 2020-03-22 LAB — URINALYSIS, ROUTINE W REFLEX MICROSCOPIC
Bilirubin Urine: NEGATIVE
Glucose, UA: NEGATIVE
Hgb urine dipstick: NEGATIVE
Ketones, ur: NEGATIVE
Leukocytes,Ua: NEGATIVE
Nitrite: NEGATIVE
Protein, ur: NEGATIVE
Specific Gravity, Urine: 1.02 (ref 1.001–1.03)
pH: 7 (ref 5.0–8.0)

## 2020-03-22 LAB — COMPLETE METABOLIC PANEL WITH GFR
AG Ratio: 2.2 (calc) (ref 1.0–2.5)
ALT: 39 U/L (ref 9–46)
AST: 27 U/L (ref 10–35)
Albumin: 4.1 g/dL (ref 3.6–5.1)
Alkaline phosphatase (APISO): 45 U/L (ref 35–144)
BUN: 20 mg/dL (ref 7–25)
CO2: 27 mmol/L (ref 20–32)
Calcium: 9.2 mg/dL (ref 8.6–10.3)
Chloride: 105 mmol/L (ref 98–110)
Creat: 0.98 mg/dL (ref 0.70–1.18)
GFR, Est African American: 89 mL/min/{1.73_m2} (ref >=60)
GFR, Est Non African American: 77 mL/min/{1.73_m2} (ref >=60)
Globulin: 1.9 g/dL (calc) (ref 1.9–3.7)
Glucose, Bld: 113 mg/dL — ABNORMAL HIGH (ref 65–99)
Potassium: 4.2 mmol/L (ref 3.5–5.3)
Sodium: 139 mmol/L (ref 135–146)
Total Bilirubin: 0.9 mg/dL (ref 0.2–1.2)
Total Protein: 6 g/dL — ABNORMAL LOW (ref 6.1–8.1)

## 2020-03-22 LAB — HEMOGLOBIN A1C
Hgb A1c MFr Bld: 5.3 % of total Hgb (ref <5.7)
Mean Plasma Glucose: 105 (calc)
eAG (mmol/L): 5.8 (calc)

## 2020-03-22 LAB — TESTOSTERONE: Testosterone: 41 ng/dL — ABNORMAL LOW (ref 250–827)

## 2020-03-22 LAB — TSH: TSH: 3.56 mIU/L (ref 0.40–4.50)

## 2020-03-22 LAB — MAGNESIUM: Magnesium: 1.9 mg/dL (ref 1.5–2.5)

## 2020-03-22 LAB — VITAMIN D 25 HYDROXY (VIT D DEFICIENCY, FRACTURES): Vit D, 25-Hydroxy: 72 ng/mL (ref 30–100)

## 2020-03-22 LAB — INSULIN, RANDOM: Insulin: 8.7 u[IU]/mL

## 2020-03-23 ENCOUNTER — Other Ambulatory Visit: Payer: Self-pay | Admitting: Internal Medicine

## 2020-03-23 MED ORDER — MOMETASONE FUROATE 0.1 % EX CREA: TOPICAL_CREAM | CUTANEOUS | 3 refills | Status: DC

## 2020-03-23 MED ORDER — MOMETASONE FUROATE 0.1 % EX CREA: TOPICAL_CREAM | CUTANEOUS | 3 refills | Status: AC

## 2020-05-03 ENCOUNTER — Other Ambulatory Visit: Payer: Self-pay | Admitting: Internal Medicine

## 2020-06-25 NOTE — Progress Notes (Signed)
Patient ID: Curtis Manifold., male   DOB: January 20, 1946, 74 y.o.   MRN: [id number]  MEDICARE ANNUAL WELLNESS VISIT AND OV  Assessment:   Encounter for Medicare annual wellness exam 1 year Encouraged to increase walking now that he is not working anymore Has hearing aids but not wearing.  Colonoscopy referral placed Declines flu vaccine Check with insurance about shingrix  Essential hypertension Labile; initially elevated; encouraged to monitor at home - continue medications, DASH diet, exercise and monitor at home. Call if greater than 130/80.   Gastroesophageal reflux disease, esophagitis presence not specified Continue PPI/H2 blocker, diet discussed  Testosterone Deficiency No longer supplemented secondary to polycythemia; on zinc; weight loss and HIIT exercise recommended  Benign prostatic hyperplasia with urinary frequency Continue meds, monitor  Erectile dysfunction, unspecified erectile dysfunction type Weight loss, continue meds PRN   Fibromyalgia Continue medications  Mixed hyperlipidemia -continue medications, check lipids, decrease fatty foods, increase activity.  -     Lipid panel   Vitamin D deficiency Continue supplement   Medication management -     Magnesium routinely PRN  Elevated serum immunoglobulin free light chains Dr. Irene Limbo following;  -     CBC with Differential/Platelet   Hx of adenomatous polyp of colon Due 2021, referral placed to Dr. Carlean Purl  Other abnormal glucose Discussed general issues about diabetes pathophysiology and management., Educational material distributed., Suggested low cholesterol diet., Encouraged aerobic exercise., Discussed foot care., Reminded to get yearly retinal exam. -     Hemoglobin A1c  Obesity (BMI 30-34.9) - long discussion about weight loss, diet, and exercise     Future Appointments  Date Time Provider [place]  10/08/2020 10:30 AM [name], MD [facility] None  03/22/2021 10:00 AM  [name], MD [facility] None     Plan:   During the course of the visit the patient was educated and counseled about appropriate screening and preventive services including:    Pneumococcal vaccine   Influenza vaccine  Td vaccine  Screening electrocardiogram  Bone densitometry screening  Colorectal cancer screening  Diabetes screening  Glaucoma screening  Nutrition counseling   Advanced directives: requested  Subjective:  Curtis Manifold.  presents for Medicare Annual Wellness Visit and OV.   He has hearing aids, does not wear all the time.   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   GERD on protonix 40 mg, famotidine 40 mg daily, had breakthrough with attempt to reduce.   BMI is Body mass index is 30.56 kg/m., he has not been working on diet and exercise, generally active around home/ yard work, no intentional execise. Admits eating too much Wt Readings from Last 3 Encounters:  06/28/20 213 lb (96.6 kg)  03/19/20 210 lb 9.6 oz (95.5 kg)  01/01/20 206 lb 12.8 oz (93.8 kg)   Patient is treated for HTN since 1994 & BP has been controlled at home. BP: 138/82  Patient had a negative Myoview & Normal Heart Cath in 2011.  Patient has had no complaints of any cardiac type chest pain, palpitations, dyspnea/orthopnea/PND, dizziness, claudication, or dependent edema.  Hyperlipidemia is controlled with diet & meds. Patient denies myalgias or other med SE's. Last Lipids were at goal. Rosuvastatin 40 mg AND zetia 10 mg and very aggressively controlled -  Lab Results  Component Value Date   CHOL 116 03/19/2020   HDL 68 03/19/2020   LDLCALC 28 03/19/2020   LDLDIRECT 151.0 02/10/2010   TRIG 118  03/19/2020   CHOLHDL 1.7 03/19/2020   Also, the patient is screened for PreDiabetes and has had no symptoms of reactive hypoglycemia, diabetic polys, paresthesias or visual blurring.  Lab Results  Component Value Date   HGBA1C  5.3 03/19/2020    Further, the patient also has history of Vitamin D Deficiency Lab Results  Component Value Date   VD25OH 72 03/19/2020   Lab Results  Component Value Date   GFRNONAA 77 03/19/2020   He has a history of testosterone deficiency, was discontinued due to polycythemia which has since resolved; Dr. Irene Limbo following, now on zinc 50 mg daily  Lab Results  Component Value Date   TESTOSTERONE 41 (L) 03/19/2020     Medication Review:   Current Outpatient Medications (Cardiovascular):  .  ezetimibe (ZETIA) 10 MG tablet, Take 1 tablet Daily for Cholesterol .  hydrochlorothiazide (HYDRODIURIL) 25 MG tablet, TAKE 1 TABLET BY MOUTH ONCE DAILY FOR BLOOD PRESSURE AND FLUID RETENTION .  losartan (COZAAR) 100 MG tablet, Take 1 tablet Daily for BP .  rosuvastatin (CRESTOR) 40 MG tablet, Take 1 tablet daily for Cholesterol  Current Outpatient Medications (Respiratory):  .  cetirizine (ZYRTEC) 10 MG tablet, Take 1 tablet (10 mg total) by mouth at bedtime. Marland Kitchen  ipratropium (ATROVENT) 0.06 % nasal spray, Use 1 to 2 sprays each nostril 1 to 3 x /day for Nasal Congestion  Current Outpatient Medications (Analgesics):  .  aspirin (ASPIR-LOW) 81 MG EC tablet, Take 81 mg by mouth daily.     Current Outpatient Medications (Other):  Marland Kitchen  B Complex-C (SUPER B COMPLEX PO), Take 1 tablet by mouth 2 (two) times daily.  Marland Kitchen  buPROPion (WELLBUTRIN XL) 300 MG 24 hr tablet, TAKE 1 TABLET BY MOUTH DAILY FOR MOOD .  diclofenac Sodium (VOLTAREN) 1 % GEL, Apply 2 g topically as needed. .  famotidine (PEPCID) 40 MG tablet, Take 1 tablet Daily for Indigestion & Heartburn .  fish oil-omega-3 fatty acids 1000 MG capsule, Take 1 capsule by mouth 2 (two) times daily.   .  Magnesium 250 MG TABS, Take 1 tablet by mouth 2 (two) times daily.   .  mometasone (ELOCON) 0.1 % cream, Apply to affected area 2 x /daily .  Multiple Vitamins-Minerals (STRESS B COMPLEX/ZINC) TABS, Take 1 tablet by mouth daily.   Marland Kitchen  OVER THE  COUNTER MEDICATION, Takes calcium, magnesium zinc vitamin 2 times a day. .  pantoprazole (PROTONIX) 40 MG tablet, Take 1 tablet Daily for Indigestion & Heartburn .  sertraline (ZOLOFT) 100 MG tablet, Take 1 tablet Daily for Mood .  vitamin C (ASCORBIC ACID) 500 MG tablet, Take 500 mg by mouth daily. Marland Kitchen  VITAMIN D PO, Take 5,000 Units by mouth 2 (two) times daily. Marland Kitchen  zinc gluconate 50 MG tablet, Take 50 mg by mouth daily.  Current Problems (verified) Patient Active Problem List   Diagnosis Date Noted  . Polycythemia 05/29/2019  . Dysthymia 05/28/2019  . 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 Right flank pain 05/28/2019  . Obesity (BMI 30.0-34.9) 09/05/2015  . Abnormal glucose 08/31/2015  . Erectile dysfunction 08/31/2015  . 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 Medication management 11/19/2013  . Benign prostatic hyperplasia   . GERD   . Fibromyalgia   . Testosterone Deficiency   . Hyperlipidemia, mixed   . Vitamin D deficiency   . Essential hypertension 02/15/2010  . Hx of adenomatous polyp of colon 06/19/2007   Screening Tests Immunization History  Administered Date(s)  Administered  . Influenza, High Dose Seasonal PF 08/13/2014, 08/31/2015  . Janssen (J&J) SARS-COV-2 Vaccination 02/24/2020  . Pneumococcal Conjugate-13 08/13/2014  . Pneumococcal Polysaccharide-23 07/12/2012  . Tdap 04/25/2009, 12/02/2019  . Zoster 07/27/2007   Preventative care: Last colonoscopy: 05/2015 Dr. Carlean Purl, due 2021, will refer back   Tetanus: 2021 Influenza: 2016, declines  Pneumonia: 2013 Prevnar: 2015 Shingles: 2008 discussed shingrix, [facility] Covid 19: 02/2020, J&J  Names of Other Physician/Practitioners you currently use: 1. [facility] here for primary care 2. [facility], glasses, eye doctor, last visit 2021 3. Dr Shanon Brow Spong,DDS, dentist, last visit 2021/every 6 months  Patient Care Team: [name], MD as PCP -  General (Internal Medicine) Larey Dresser, MD as Consulting Physician (Cardiology) Sable Feil, MD as Consulting Physician (Gastroenterology) Bensimhon, Shaune Pascal, MD as Consulting Physician (Cardiology)  Allergies Allergies  Allergen Reactions  . Atenolol   . Atorvastatin   . Darvocet [Propoxyphene N-Acetaminophen]   . Paroxetine   . Pregabalin   . Propoxyphene Hcl     SURGICAL HISTORY He  has a past surgical history that includes Cardiac catheterization (02/2010); Heel spur surgery (1960); Tonsillectomy (1962); Finger surgery (2000); and BIH REPAIR (08/2005). FAMILY HISTORY His family history includes Aortic stenosis in an other family member; Aortic stenosis (age of onset: 33) in his father; Breast cancer in his mother and another family member; Diabetes in an other family member; Hypertension in his mother and another family member; Other in an other family member. SOCIAL HISTORY He  reports that he has never smoked. He has never used smokeless tobacco. He reports current alcohol use of about 12.0 standard drinks of alcohol per week. He reports that he does not use drugs.  MEDICARE WELLNESS OBJECTIVES: Physical activity: Current Exercise Habits: The patient does not participate in regular exercise at present, Exercise limited by: None identified Cardiac risk factors: Cardiac Risk Factors include: advanced age (>21men, >28 women);dyslipidemia;hypertension;male gender;obesity (BMI >30kg/m2);sedentary lifestyle Depression/mood screen:   Depression screen [facility] 2/9 06/28/2020  Decreased Interest 0  Down, Depressed, Hopeless 1  PHQ - 2 Score 1  Altered sleeping 0  Tired, decreased energy 0  Change in appetite 0  Feeling bad or failure about yourself  0  Trouble concentrating 0  Moving slowly or fidgety/restless 0  Suicidal thoughts 0  PHQ-9 Score 1  Difficult doing work/chores Not difficult at all    ADLs:  In your present state of health, do you have any difficulty  performing the following activities: 06/28/2020 03/18/2020  Hearing? Y N  Comment has hearing aids, not wearing with mask -  Vision? N N  Difficulty concentrating or making decisions? N N  Walking or climbing stairs? N N  Dressing or bathing? N N  Doing errands, shopping? N N  Some recent data might be hidden     Cognitive Testing  Alert? Yes  Normal Appearance?Yes  Oriented to person? Yes  Place? Yes   Time? Yes  Recall of three objects?  Yes  Can perform simple calculations? Yes  Displays appropriate judgment?Yes  Can read the correct time from a watch face?Yes  EOL planning: Does Patient Have a Medical Advance Directive?: Yes Type of Advance Directive: Healthcare Power of Attorney, Living will Does patient want to make changes to medical advance directive?: No - Patient declined Copy of [place] in Chart?: No - copy requested   Review of Systems  Constitutional: Negative for malaise/fatigue and weight loss.  HENT: Negative for hearing loss and tinnitus.   Eyes: Negative for blurred vision and double vision.  Respiratory: Negative for cough, sputum production, shortness of breath and wheezing.   Cardiovascular: Negative for chest pain, palpitations, orthopnea, claudication, leg swelling and PND.  Gastrointestinal: Negative for abdominal pain, blood in stool, constipation, diarrhea, heartburn, melena, nausea and vomiting.  Genitourinary: Negative.   Musculoskeletal: Negative for falls, joint pain and myalgias.  Skin: Negative for rash.  Neurological: Negative for dizziness, tingling, sensory change, weakness and headaches.  Endo/Heme/Allergies: Negative for polydipsia.  Psychiatric/Behavioral: Negative.  Negative for depression, memory loss, substance abuse and suicidal ideas. The patient is not nervous/anxious and does not have insomnia.   All other systems reviewed and are negative.    Objective:     Blood pressure 138/82, pulse 61, temperature (!)  97.3 F (36.3 C), weight 213 lb (96.6 kg), SpO2 97 %.   General Appearance:  Alert  WD/WN, male  in no apparent distress. Eyes: PERRLA, EOMs nl, conjunctiva normal Sinuses: + frontal/maxillary tenderness ENT/Mouth: EACs patent bilaterally.Marland KitchenMarland KitchenNares clear without erythema, swelling, mucoid exudates. Oral hygiene is good. Without erythema, w/o exudate. Tongue normal, non-obstructing. Tonsils not swollen or erythematous. Mildly HOH, not wearing his hearing aids  Neck: Supple, thyroid normal. No bruits, nodes or JVD. Respiratory: Respiratory effort normal.  BS equal and clear bilateral without rales, rhonci, wheezing or stridor. Cardio: Heart sounds are normal with regular rate and rhythm and no murmurs, rubs or gallops. Peripheral pulses are normal and equal bilaterally without edema. No aortic or femoral bruits. Chest: symmetric with normal excursions and percussion.  Abdomen: Distended, obese, soft, with nl bowel sounds, + bulging retractable hernia just superior to well healed surgical scar from previous hernia. Nontender, no guarding, rebound, hernias, masses, or organomegaly.  Lymphatics: Non tender without lymphadenopathy.  Musculoskeletal: Full ROM all peripheral extremities, joint stability, 5/5 strength, and normal gait. Skin: Warm and dry without rashes, lesions, cyanosis, clubbing or  ecchymosis.  Neuro: Cranial nerves intact, reflexes equal bilaterally. Normal muscle tone, no cerebellar symptoms. Sensation intact.  Pysch: Alert and oriented X 3 with normal affect, insight and judgment appropriate.   Medicare Attestation I have personally reviewed: The patient's medical and social history Their use of alcohol, tobacco or illicit drugs Their current medications and supplements The patient's functional ability including ADLs,fall risks, home safety risks, cognitive, and hearing and visual impairment Diet and physical activities Evidence for depression or mood disorders  The patient's  weight, height, BMI, and visual acuity have been recorded in the chart.  I have made referrals, counseling, and provided education to the patient based on review of the above and I have provided the patient with a written personalized care plan for preventive services.  Over 40 minutes of exam, counseling, chart review was performed.  Izora Ribas, NP   06/28/2020

## 2020-06-28 ENCOUNTER — Ambulatory Visit (INDEPENDENT_AMBULATORY_CARE_PROVIDER_SITE_OTHER): Payer: Medicare Other | Admitting: Adult Health

## 2020-06-28 ENCOUNTER — Encounter: Payer: Self-pay | Admitting: Adult Health

## 2020-06-28 ENCOUNTER — Other Ambulatory Visit: Payer: Self-pay

## 2020-06-28 VITALS — BP 138/82 | HR 61 | Temp 97.3°F | Wt 213.0 lb

## 2020-06-28 DIAGNOSIS — E782 Mixed hyperlipidemia: Secondary | ICD-10-CM

## 2020-06-28 DIAGNOSIS — E291 Testicular hypofunction: Secondary | ICD-10-CM | POA: Diagnosis not present

## 2020-06-28 DIAGNOSIS — E669 Obesity, unspecified: Secondary | ICD-10-CM

## 2020-06-28 DIAGNOSIS — R7309 Other abnormal glucose: Secondary | ICD-10-CM

## 2020-06-28 DIAGNOSIS — N401 Enlarged prostate with lower urinary tract symptoms: Secondary | ICD-10-CM | POA: Diagnosis not present

## 2020-06-28 DIAGNOSIS — N529 Male erectile dysfunction, unspecified: Secondary | ICD-10-CM

## 2020-06-28 DIAGNOSIS — Z0001 Encounter for general adult medical examination with abnormal findings: Secondary | ICD-10-CM | POA: Diagnosis not present

## 2020-06-28 DIAGNOSIS — I10 Essential (primary) hypertension: Secondary | ICD-10-CM

## 2020-06-28 DIAGNOSIS — F341 Dysthymic disorder: Secondary | ICD-10-CM

## 2020-06-28 DIAGNOSIS — M797 Fibromyalgia: Secondary | ICD-10-CM | POA: Diagnosis not present

## 2020-06-28 DIAGNOSIS — E559 Vitamin D deficiency, unspecified: Secondary | ICD-10-CM

## 2020-06-28 DIAGNOSIS — R6889 Other general symptoms and signs: Secondary | ICD-10-CM

## 2020-06-28 DIAGNOSIS — K219 Gastro-esophageal reflux disease without esophagitis: Secondary | ICD-10-CM | POA: Diagnosis not present

## 2020-06-28 DIAGNOSIS — Z79899 Other long term (current) drug therapy: Secondary | ICD-10-CM

## 2020-06-28 DIAGNOSIS — Z86010 Personal history of colonic polyps: Secondary | ICD-10-CM

## 2020-06-28 DIAGNOSIS — D751 Secondary polycythemia: Secondary | ICD-10-CM

## 2020-06-28 DIAGNOSIS — Z0000 Encounter for general adult medical examination without abnormal findings: Secondary | ICD-10-CM

## 2020-06-28 DIAGNOSIS — R350 Frequency of micturition: Secondary | ICD-10-CM

## 2020-06-28 NOTE — Patient Instructions (Addendum)
Curtis Price , Thank you for taking time to come for your Medicare Wellness Visit. I appreciate your ongoing commitment to your health goals. Please review the following plan we discussed and let me know if I can assist you in the future.   These are the goals we discussed: Goals    . Blood Pressure < 130/80    . DIET - INCREASE WATER INTAKE     65+ fluid ounces daily     . LDL CALC < 100    . Weight (lb) < 200 lb (90.7 kg)       This is a list of the screening recommended for you and due dates:  Health Maintenance  Topic Date Due  . Colon Cancer Screening  06/08/2020  . Flu Shot  12/23/2020*  . Tetanus Vaccine  12/01/2029  . COVID-19 Vaccine  Completed  . Pneumonia vaccines  Completed  .  Hepatitis C: One time screening is recommended by Center for Disease Control  (CDC) for  adults born from [birth year] through 1964.   Discontinued  *Topic was postponed. The date shown is not the original due date.    Please check with insurance about shingrix - 2 shot vaccine for shingles  [facility]  Please start checking blood pressure at home occasionally -      9 Ways to Naturally Increase Testosterone Levels  1.   Lose Weight If you're overweight, shedding the excess pounds may increase your testosterone levels, according to research presented at the Endocrine Society's 2012 meeting. Overweight men are more likely to have low testosterone levels to begin with, so this is an important trick to increase your body's testosterone production when you need it most.  2.   High-Intensity Exercise like Peak Fitness  Short intense exercise has a proven positive effect on increasing testosterone levels and preventing its decline. That's unlike aerobics or prolonged moderate exercise, which have shown to have negative or no effect on testosterone levels. Having a whey protein meal after exercise can further enhance the satiety/testosterone-boosting impact (hunger hormones cause the  opposite effect on your testosterone and libido). Here's a summary of what a typical high-intensity Peak Fitness routine might look like: " Warm up for three minutes  " Exercise as hard and fast as you can for 30 seconds. You should feel like you couldn't possibly go on another few seconds  " Recover at a slow to moderate pace for 90 seconds  " Repeat the high intensity exercise and recovery 7 more times .  3.   Consume Plenty of Zinc The mineral zinc is important for testosterone production, and supplementing your diet for as little as six weeks has been shown to cause a marked improvement in testosterone among men with low levels.1 Likewise, research has shown that restricting dietary sources of zinc leads to a significant decrease in testosterone, while zinc supplementation increases it2 -- and even protects men from exercised-induced reductions in testosterone levels.3 It's estimated that up to 29 percent of adults over the age of 60 may have lower than recommended zinc intakes; even when dietary supplements were added in, an estimated 20-25 percent of older adults still had inadequate zinc intakes, according to a [employer] and Nutrition Examination Survey.4 Your diet is the best source of zinc; along with protein-rich foods like meats and fish, other good dietary sources of zinc include raw milk, raw cheese, beans, and yogurt or kefir made from raw milk. It can be difficult  to obtain enough dietary zinc if you're a vegetarian, and also for meat-eaters as well, largely because of conventional farming methods that rely heavily on chemical fertilizers and pesticides. These chemicals deplete the soil of nutrients ... nutrients like zinc that must be absorbed by plants in order to be passed on to you. In many cases, you may further deplete the nutrients in your food by the way you prepare it. For most food, cooking it will drastically reduce its levels of nutrients like zinc ... particularly  over-cooking, which many people do. If you decide to use a zinc supplement, stick to a dosage of less than 40 mg a day, as this is the recommended adult upper limit. Taking too much zinc can interfere with your body's ability to absorb other minerals, especially copper, and may cause nausea as a side effect.  4.   Strength Training In addition to Peak Fitness, strength training is also known to boost testosterone levels, provided you are doing so intensely enough. When strength training to boost testosterone, you'll want to increase the weight and lower your number of reps, and then focus on exercises that work a large number of muscles, such as dead lifts or squats.  You can "turbo-charge" your weight training by going slower. By slowing down your movement, you're actually turning it into a high-intensity exercise. Super Slow movement allows your muscle, at the microscopic level, to access the maximum number of cross-bridges between the protein filaments that produce movement in the muscle.   5.   Optimize Your Vitamin D Levels Vitamin D, a steroid hormone, is essential for the healthy development of the nucleus of the sperm cell, and helps maintain semen quality and sperm count. Vitamin D also increases levels of testosterone, which may boost libido. In one study, overweight men who were given vitamin D supplements had a significant increase in testosterone levels after one year.5   6.   Reduce Stress When you're under a lot of stress, your body releases high levels of the stress hormone cortisol. This hormone actually blocks the effects of testosterone,6 presumably because, from a biological standpoint, testosterone-associated behaviors (mating, competing, aggression) may have lowered your chances of survival in an emergency (hence, the "fight or flight" response is dominant, courtesy of cortisol).  7.   Limit or Eliminate Sugar from Your Diet Testosterone levels decrease after you eat sugar, which  is likely because the sugar leads to a high insulin level, another factor leading to low testosterone.7 Based on USDA estimates, the average American consumes 12 teaspoons of sugar a day, which equates to about TWO TONS of sugar during a lifetime.  8.   Eat Healthy Fats By healthy, this means not only mon- and polyunsaturated fats, like that found in avocadoes and nuts, but also saturated, as these are essential for building testosterone. Research shows that a diet with less than 40 percent of energy as fat (and that mainly from animal sources, i.e. saturated) lead to a decrease in testosterone levels.8 My personal diet is about 60-70 percent healthy fat, and other experts agree that the ideal diet includes somewhere between 50-70 percent fat.  It's important to understand that your body requires saturated fats from animal and vegetable sources (such as meat, dairy, certain oils, and tropical plants like coconut) for optimal functioning, and if you neglect this important food group in favor of sugar, grains and other starchy carbs, your health and weight are almost guaranteed to suffer. Examples of healthy fats you can  eat more of to give your testosterone levels a boost include: Olives and Olive oil  Coconuts and coconut oil Butter made from raw grass-fed organic milk Raw nuts, such as, almonds or pecans Organic pastured egg yolks Avocados Grass-fed meats Palm oil Unheated organic nut oils   9.   Boost Your Intake of Branch Chain Amino Acids (BCAA) from Foods Like [employer] suggests that BCAAs result in higher testosterone levels, particularly when taken along with resistance training.9 While BCAAs are available in supplement form, you'll find the highest concentrations of BCAAs like leucine in dairy products - especially quality cheeses and whey protein. Even when getting leucine from your natural food supply, it's often wasted or used as a building block instead of an anabolic agent. So  to create the correct anabolic environment, you need to boost leucine consumption way beyond mere maintenance levels. That said, keep in mind that using leucine as a free form amino acid can be highly counterproductive as when free form amino acids are artificially administrated, they rapidly enter your circulation while disrupting insulin function, and impairing your body's glycemic control. Food-based leucine is really the ideal form that can benefit your muscles without side effects.

## 2020-06-29 ENCOUNTER — Other Ambulatory Visit: Payer: Self-pay | Admitting: Adult Health

## 2020-06-29 LAB — COMPLETE METABOLIC PANEL WITH GFR
AG Ratio: 2.2 (calc) (ref 1.0–2.5)
ALT: 38 U/L (ref 9–46)
AST: 27 U/L (ref 10–35)
Albumin: 4 g/dL (ref 3.6–5.1)
Alkaline phosphatase (APISO): 41 U/L (ref 35–144)
BUN: 22 mg/dL (ref 7–25)
CO2: 27 mmol/L (ref 20–32)
Calcium: 9 mg/dL (ref 8.6–10.3)
Chloride: 105 mmol/L (ref 98–110)
Creat: 1.14 mg/dL (ref 0.70–1.18)
GFR, Est African American: 74 mL/min/{1.73_m2} (ref >=60)
GFR, Est Non African American: 63 mL/min/{1.73_m2} (ref >=60)
Globulin: 1.8 g/dL (calc) — ABNORMAL LOW (ref 1.9–3.7)
Glucose, Bld: 110 mg/dL — ABNORMAL HIGH (ref 65–99)
Potassium: 4.2 mmol/L (ref 3.5–5.3)
Sodium: 139 mmol/L (ref 135–146)
Total Bilirubin: 0.6 mg/dL (ref 0.2–1.2)
Total Protein: 5.8 g/dL — ABNORMAL LOW (ref 6.1–8.1)

## 2020-06-29 LAB — LIPID PANEL
Cholesterol: 112 mg/dL (ref <200)
HDL: 66 mg/dL (ref >=40)
LDL Cholesterol (Calc): 31 mg/dL (calc)
Non-HDL Cholesterol (Calc): 46 mg/dL (calc) (ref <130)
Total CHOL/HDL Ratio: 1.7 (calc) (ref <5.0)
Triglycerides: 71 mg/dL (ref <150)

## 2020-06-29 LAB — CBC WITH DIFFERENTIAL/PLATELET
Absolute Monocytes: 743 cells/uL (ref 200–950)
Basophils Absolute: 18 cells/uL (ref 0–200)
Basophils Relative: 0.3 %
Eosinophils Absolute: 248 cells/uL (ref 15–500)
Eosinophils Relative: 4.2 %
HCT: 42.5 % (ref 38.5–50.0)
Hemoglobin: 15 g/dL (ref 13.2–17.1)
Lymphs Abs: 974 cells/uL (ref 850–3900)
MCH: 32.7 pg (ref 27.0–33.0)
MCHC: 35.3 g/dL (ref 32.0–36.0)
MCV: 92.6 fL (ref 80.0–100.0)
MPV: 10.8 fL (ref 7.5–12.5)
Monocytes Relative: 12.6 %
Neutro Abs: 3918 cells/uL (ref 1500–7800)
Neutrophils Relative %: 66.4 %
Platelets: 156 10*3/uL (ref 140–400)
RBC: 4.59 10*6/uL (ref 4.20–5.80)
RDW: 12.2 % (ref 11.0–15.0)
Total Lymphocyte: 16.5 %
WBC: 5.9 10*3/uL (ref 3.8–10.8)

## 2020-06-29 LAB — MAGNESIUM: Magnesium: 1.8 mg/dL (ref 1.5–2.5)

## 2020-06-29 LAB — TSH: TSH: 3.38 mIU/L (ref 0.40–4.50)

## 2020-06-29 NOTE — Progress Notes (Signed)
PATIENT'S WIFE (SANDRA) IS AWARE OF LAB RESULTS AND INSTRUCTIONS. [facility]

## 2020-07-06 ENCOUNTER — Telehealth: Payer: Self-pay

## 2020-07-06 NOTE — Telephone Encounter (Signed)
Patient's wife states that as on last night, patient has been dizzy, concerned about dehydration, urine is dark, not eating, vomitting and unable to move or open eyes.   Provider informed and advised for patient go to an ED.

## 2020-07-08 ENCOUNTER — Ambulatory Visit (INDEPENDENT_AMBULATORY_CARE_PROVIDER_SITE_OTHER): Payer: Medicare Other | Admitting: Internal Medicine

## 2020-07-08 ENCOUNTER — Other Ambulatory Visit: Payer: Self-pay

## 2020-07-08 ENCOUNTER — Encounter: Payer: Self-pay | Admitting: Internal Medicine

## 2020-07-08 VITALS — BP 122/90 | HR 67 | Temp 97.0°F | Resp 16 | Ht 70.0 in | Wt 210.0 lb

## 2020-07-08 DIAGNOSIS — Z79899 Other long term (current) drug therapy: Secondary | ICD-10-CM

## 2020-07-08 DIAGNOSIS — R112 Nausea with vomiting, unspecified: Secondary | ICD-10-CM

## 2020-07-08 DIAGNOSIS — R42 Dizziness and giddiness: Secondary | ICD-10-CM

## 2020-07-08 MED ORDER — DEXAMETHASONE 4 MG PO TABS: ORAL_TABLET | ORAL | 0 refills | Status: DC

## 2020-07-08 MED ORDER — PROCHLORPERAZINE MALEATE 5 MG PO TABS: ORAL_TABLET | ORAL | 0 refills | Status: DC

## 2020-07-08 NOTE — Progress Notes (Signed)
History of Present Illness:      Patient is a nice 74 yo MWM with  HTN, HLD, Prediabetes, GERD,  testosterone Deficiency  and Vitamin D Deficiency  Who presents with a 4 day hx of nausea & "dizziness" which he describes consistent with vertigo. Denies any focal neuro signs or sx's.  Oral intake has been poor, but trying to maintain fluid intake  Medications  .  hydrochlorothiazide 25 MG tablet, TAKE 1 TABLET DAILY .  losartan 100 MG tablet, Take 1 tablet Daily for BP .  rosuvastatin  40 MG tablet, Take 1 tablet daily for Cholesterol .  cetirizine  10 MG tablet, Take 1 tablet  at bedtime. .  ATROVENT 0.06 % nasal spray, Use 1 to 2 sprays each nostril 1 to 3 x /day  .  aspirin (ASPIR-LOW) 81 MG EC tablet, Take 81 mg by mouth daily.  :  .  SUPER B COMPLEX , Take 1 tablet  2  times daily.  Marland Kitchen  buPROPion-XL 300 MG , TAKE 1 TABLET  DAILY  .  diclofenac  1 % GEL, Apply 2 g topically as needed. .  famotidine  40 MG tablet, Take 1 tablet Daily .  fish oil-omega-3 fatty acids 1000 MG cap, Take 1 capsule  2  times daily.   .  Magnesium 250 MG , Take 1 tablet 2  times daily.   Marland Kitchen  ELOCON 0.1 % cream, Apply to affected area 2 x /daily .  MultiVit-Minerals , Take 1 tablet  daily.   .  Takes calcium, magnesium zinc vitamin 2 times a day. .  pantoprazole  40 MG , Take 1 tablet Daily .  sertraline  100 MG tablet, Take 1 tablet Daily .  vitamin C 500 MG tablet, Take  daily. Marland Kitchen  VITAMIN D , Take 5,000 Units  2  times daily. Marland Kitchen  zinc 50 MG tab, Take  daily.  Problem list He has Essential hypertension; Benign prostatic hyperplasia; GERD; Fibromyalgia; Testosterone Deficiency; Hyperlipidemia, mixed; Vitamin D deficiency; Medication management; Elevated serum immunoglobulin free light chains; Hx of adenomatous polyp of colon; Abnormal glucose; Erectile dysfunction; Obesity (BMI 30.0-34.9); Dysthymia; Recurrent cold sores; Right flank pain; and Polycythemia on their problem list.    Observations/Objective:  BP 122/90- SIT & 124/90 - STAND P 67   T 97 F    R 16   SpO2  92%   Postural     Sit     BP 136/93    P 66         &            Stand     BP  136/100    P  74  HEENT - EACs patent, TM's sl retracted    N/O/P - clear.  Neck - supple.  Chest - Clear equal BS. Cor - Nl HS. RRR w/o sig MGR. PP 1(+). No edema. MS- FROM w/o deformities.  Gait Nl. Neuro -  Nl w/o focal abnormalities.  Assessment and Plan:  1. Intractable vomiting with nausea  - CBC with Differential/Platelet  - COMPLETE METABOLIC PANEL WITH GFR  - prochlorperazine  5 MG tablet; Take  1 to 2 tablets  3 x /day  as needed for Nausea or Vertigo  Dispense: 60 tablet;  2. Vertigo  - prochlorperazine (COMPAZINE) 5 MG tablet; Take      1 to 2 tablets      3 x /day      as needed  for Nausea or Vertigo  Dispense: 60 tablet  - dexamethasone (DECADRON) 4 MG tablet; Take 1 tab 3 x day - 2 days, then 2 x day - 2 days, then 1 tab daily  Dispense: 13 tablet;   3. Medication management  - CBC with Differential/Platelet - COMPLETE METABOLIC PANEL WITH GFR   Follow Up Instructions:        I discussed the assessment and treatment plan with the patient. The patient was provided an opportunity to ask questions and all were answered. The patient agreed with the plan and demonstrated an understanding of the instructions.       The patient was advised to call back or seek an in-person evaluation if the symptoms worsen or if the condition fails to improve as anticipated.    Kirtland Bouchard, MD

## 2020-07-09 ENCOUNTER — Ambulatory Visit: Payer: Medicare Other | Admitting: Internal Medicine

## 2020-07-09 LAB — CBC WITH DIFFERENTIAL/PLATELET
Absolute Monocytes: 1000 cells/uL — ABNORMAL HIGH (ref 200–950)
Basophils Absolute: 40 cells/uL (ref 0–200)
Basophils Relative: 0.4 %
Eosinophils Absolute: 208 cells/uL (ref 15–500)
Eosinophils Relative: 2.1 %
HCT: 52 % — ABNORMAL HIGH (ref 38.5–50.0)
Hemoglobin: 18.4 g/dL — ABNORMAL HIGH (ref 13.2–17.1)
Lymphs Abs: 1356 cells/uL (ref 850–3900)
MCH: 32.6 pg (ref 27.0–33.0)
MCHC: 35.4 g/dL (ref 32.0–36.0)
MCV: 92.2 fL (ref 80.0–100.0)
MPV: 11.1 fL (ref 7.5–12.5)
Monocytes Relative: 10.1 %
Neutro Abs: 7296 cells/uL (ref 1500–7800)
Neutrophils Relative %: 73.7 %
Platelets: 188 10*3/uL (ref 140–400)
RBC: 5.64 10*6/uL (ref 4.20–5.80)
RDW: 12.4 % (ref 11.0–15.0)
Total Lymphocyte: 13.7 %
WBC: 9.9 10*3/uL (ref 3.8–10.8)

## 2020-07-09 LAB — COMPLETE METABOLIC PANEL WITH GFR
AG Ratio: 2.1 (calc) (ref 1.0–2.5)
ALT: 46 U/L (ref 9–46)
AST: 27 U/L (ref 10–35)
Albumin: 4.5 g/dL (ref 3.6–5.1)
Alkaline phosphatase (APISO): 51 U/L (ref 35–144)
BUN: 18 mg/dL (ref 7–25)
CO2: 29 mmol/L (ref 20–32)
Calcium: 10.2 mg/dL (ref 8.6–10.3)
Chloride: 102 mmol/L (ref 98–110)
Creat: 1.13 mg/dL (ref 0.70–1.18)
GFR, Est African American: 74 mL/min/{1.73_m2} (ref >=60)
GFR, Est Non African American: 64 mL/min/{1.73_m2} (ref >=60)
Globulin: 2.1 g/dL (calc) (ref 1.9–3.7)
Glucose, Bld: 127 mg/dL — ABNORMAL HIGH (ref 65–99)
Potassium: 4.2 mmol/L (ref 3.5–5.3)
Sodium: 138 mmol/L (ref 135–146)
Total Bilirubin: 0.6 mg/dL (ref 0.2–1.2)
Total Protein: 6.6 g/dL (ref 6.1–8.1)

## 2020-08-02 ENCOUNTER — Other Ambulatory Visit: Payer: Self-pay | Admitting: Internal Medicine

## 2020-08-02 DIAGNOSIS — K219 Gastro-esophageal reflux disease without esophagitis: Secondary | ICD-10-CM

## 2020-08-23 ENCOUNTER — Other Ambulatory Visit: Payer: Self-pay

## 2020-08-23 ENCOUNTER — Ambulatory Visit (AMBULATORY_SURGERY_CENTER): Payer: Self-pay | Admitting: *Deleted

## 2020-08-23 VITALS — Ht 70.0 in | Wt 214.0 lb

## 2020-08-23 DIAGNOSIS — Z86010 Personal history of colonic polyps: Secondary | ICD-10-CM

## 2020-08-23 NOTE — Progress Notes (Signed)
Patient is here in-person for PV. Patient denies any allergies to eggs or soy. Patient denies any problems with anesthesia/sedation. Patient denies any oxygen use at home. Patient denies taking any diet/weight loss medications or blood thinners. Patient is not being treated for MRSA or C-diff. Patient is aware of our care-partner policy and [id number]-[id number] safety protocol. COVID-19 vaccines completed on 02/24/2020 J&J , per patient.

## 2020-08-26 ENCOUNTER — Encounter: Payer: Self-pay | Admitting: Internal Medicine

## 2020-09-09 ENCOUNTER — Encounter: Payer: Self-pay | Admitting: Certified Registered Nurse Anesthetist

## 2020-09-10 ENCOUNTER — Encounter: Payer: Self-pay | Admitting: Internal Medicine

## 2020-09-10 ENCOUNTER — Other Ambulatory Visit (INDEPENDENT_AMBULATORY_CARE_PROVIDER_SITE_OTHER): Payer: Medicare Other

## 2020-09-10 ENCOUNTER — Ambulatory Visit (AMBULATORY_SURGERY_CENTER): Payer: Medicare Other | Admitting: Internal Medicine

## 2020-09-10 ENCOUNTER — Other Ambulatory Visit: Payer: Self-pay

## 2020-09-10 VITALS — BP 124/79 | HR 60 | Temp 98.0°F | Resp 12 | Ht 70.0 in | Wt 214.0 lb

## 2020-09-10 DIAGNOSIS — D123 Benign neoplasm of transverse colon: Secondary | ICD-10-CM

## 2020-09-10 DIAGNOSIS — D120 Benign neoplasm of cecum: Secondary | ICD-10-CM | POA: Diagnosis not present

## 2020-09-10 DIAGNOSIS — Z86010 Personal history of colonic polyps: Secondary | ICD-10-CM

## 2020-09-10 DIAGNOSIS — K639 Disease of intestine, unspecified: Secondary | ICD-10-CM | POA: Diagnosis not present

## 2020-09-10 DIAGNOSIS — D122 Benign neoplasm of ascending colon: Secondary | ICD-10-CM

## 2020-09-10 DIAGNOSIS — D128 Benign neoplasm of rectum: Secondary | ICD-10-CM

## 2020-09-10 LAB — BUN: BUN: 11 mg/dL (ref 6–23)

## 2020-09-10 LAB — CREATININE, SERUM: Creatinine, Ser: 1.01 mg/dL (ref 0.40–1.50)

## 2020-09-10 MED ORDER — SODIUM CHLORIDE 0.9 % IV SOLN: 500.0000 mL | Freq: Once | INTRAVENOUS | Status: DC

## 2020-09-10 NOTE — Op Note (Signed)
Curtis Price Name: Curtis Price Procedure Date: 09/10/2020 9:28 AM MRN: [id number] Endoscopist: Gatha Mayer , MD Age: 74 Referring MD:  Date of Birth: 1946-06-08 Gender: Male Account #: [id number] Procedure:                Colonoscopy Indications:              Surveillance: Personal history of adenomatous                            polyps on last colonoscopy 5 years ago Medicines:                Propofol per Anesthesia, Monitored Anesthesia Care Procedure:                Pre-Anesthesia Assessment:                           - Prior to the procedure, a History and Physical                            was performed, and Price medications and                            allergies were reviewed. The Price's tolerance of                            previous anesthesia was also reviewed. The risks                            and benefits of the procedure and the sedation                            options and risks were discussed with the Price.                            All questions were answered, and informed consent                            was obtained. Prior Anticoagulants: The Price has                            taken no previous anticoagulant or antiplatelet                            agents. ASA Grade Assessment: II - A Price with                            mild systemic disease. After reviewing the risks                            and benefits, the Price was deemed in                            satisfactory condition to undergo the procedure.  After obtaining informed consent, the colonoscope                            was passed under direct vision. Throughout the                            procedure, the Price's blood pressure, pulse, and                            oxygen saturations were monitored continuously. The                            Colonoscope was introduced through the anus and                             advanced to the the cecum, identified by                            appendiceal orifice and ileocecal valve. The                            colonoscopy was performed without difficulty. The                            Price tolerated the procedure well. The quality                            of the bowel preparation was good. The ileocecal                            valve, appendiceal orifice, and rectum were                            photographed. The bowel preparation used was                            Miralax via split dose instruction. Scope In: 3:23:55 AM Scope Out: 10:00:48 AM Scope Withdrawal Time: 0 hours 18 minutes 24 seconds  Total Procedure Duration: 0 hours 23 minutes 59 seconds  Findings:                 The perianal and digital rectal examinations were                            normal. Pertinent negatives include normal prostate                            (size, shape, and consistency).                           Six sessile polyps were found in the rectum,                            transverse colon, ascending colon and cecum. The  polyps were diminutive in size. These polyps were                            removed with a cold snare. Resection and retrieval                            were complete. Verification of Price                            identification for the specimen was done. Estimated                            blood loss was minimal.                           Multiple diverticula were found in the sigmoid                            colon.                           Internal hemorrhoids were found.                           A ? submucosal mass was found at the appendiceal                            orifice.                           The exam was otherwise without abnormality on                            direct and retroflexion views. Complications:            No immediate complications. Estimated Blood Loss:     Estimated blood  loss was minimal. Impression:               - Six diminutive polyps in the rectum, in the                            transverse colon, in the ascending colon and in the                            cecum, removed with a cold snare. Resected and                            retrieved.                           - Diverticulosis in the sigmoid colon.                           - Internal hemorrhoids.                           - ? submucosal tumor  at the appendiceal orifice.                           - The examination was otherwise normal on direct                            and retroflexion views.                           - Personal history of colonic polyps. Recommendation:           - Price has a contact number available for                            emergencies. The signs and symptoms of potential                            delayed complications were discussed with the                            Price. Return to normal activities tomorrow.                            Written discharge instructions were provided to the                            Price.                           - Resume previous diet.                           - Continue present medications.                           - Await pathology results.                           - Repeat colonoscopy is recommended. The                            colonoscopy date will be determined after pathology                            results from today's exam become available for                            review. Gatha Mayer, MD 09/10/2020 10:20:24 AM This report has been signed electronically.

## 2020-09-10 NOTE — Progress Notes (Signed)
A/ox3, pleased with MAC, report to RN 

## 2020-09-10 NOTE — Progress Notes (Signed)
Pt's states no medical or surgical changes since previsit or office visit.   Vs by CW in adm 

## 2020-09-10 NOTE — Patient Instructions (Addendum)
I found and removed 6 tiny polyps today.  They all look benign but at least some are probably precancerous like in the past.  There is a bulge where your appendix is and when I see that I get a CT scan to see what is causing that bulge.  It is probably nothing bad but it should be checked out some my office will order a CT scan.  We need to check your kidney function prior to ordering the scan and he will go to the lab for that today.  I will let you know all the results when I get them and will figure out a plan from there.  You still have diverticulosis which is not a surprise.  I appreciate the opportunity to care for you.  Gatha Mayer, MD, [facility]  Polyp, hemorrhoid, and diverticulosis handouts given to patient.  Resume previous diet. Continue present medications. Await pathology results.  CT contrast and scheduling paper given to patient.  Repeat colonoscoy recommended.  Date to be determined after pathology results reviewed.YOU HAD AN ENDOSCOPIC PROCEDURE TODAY AT [facility]:   Refer to the procedure report that was given to you for any specific questions about what was found during the examination.  If the procedure report does not answer your questions, please call your gastroenterologist to clarify.  If you requested that your care partner not be given the details of your procedure findings, then the procedure report has been included in a sealed envelope for you to review at your convenience later.  YOU SHOULD EXPECT: Some feelings of bloating in the abdomen. Passage of more gas than usual.  Walking can help get rid of the air that was put into your GI tract during the procedure and reduce the bloating. If you had a lower endoscopy (such as a colonoscopy or flexible sigmoidoscopy) you may notice spotting of blood in your stool or on the toilet paper. If you underwent a bowel prep for your procedure, you may not have a normal bowel movement for a few  days.  Please Note:  You might notice some irritation and congestion in your nose or some drainage.  This is from the oxygen used during your procedure.  There is no need for concern and it should clear up in a day or so.  SYMPTOMS TO REPORT IMMEDIATELY:   Following lower endoscopy (colonoscopy or flexible sigmoidoscopy):  Excessive amounts of blood in the stool  Significant tenderness or worsening of abdominal pains  Swelling of the abdomen that is new, acute  Fever of 100F or higher For urgent or emergent issues, a gastroenterologist can be reached at any hour by calling [phone]. Do not use MyChart messaging for urgent concerns.    DIET:  We do recommend a small meal at first, but then you may proceed to your regular diet.  Drink plenty of fluids but you should avoid alcoholic beverages for 24 hours.  ACTIVITY:  You should plan to take it easy for the rest of today and you should NOT DRIVE or use heavy machinery until tomorrow (because of the sedation medicines used during the test).    FOLLOW UP: Our staff will call the number listed on your records 48-72 hours following your procedure to check on you and address any questions or concerns that you may have regarding the information given to you following your procedure. If we do not reach you, we will leave a message.  We will attempt to reach you  two times.  During this call, we will ask if you have developed any symptoms of COVID 19. If you develop any symptoms (ie: fever, flu-like symptoms, shortness of breath, cough etc.) before then, please call [phone].  If you test positive for Covid 19 in the 2 weeks post procedure, please call and report this information to [place].    If any biopsies were taken you will be contacted by phone or by letter within the next 1-3 weeks.  Please call us at [phone] if you have not heard about the biopsies in 3 weeks.    SIGNATURES/CONFIDENTIALITY: You and/or your care partner have  signed paperwork which will be entered into your electronic medical record.  These signatures attest to the fact that that the information above on your After Visit Summary has been reviewed and is understood.  Full responsibility of the confidentiality of this discharge information lies with you and/or your care-partner.

## 2020-09-10 NOTE — Progress Notes (Signed)
Called to room to assist during endoscopic procedure.  Patient ID and intended procedure confirmed with present staff. Received instructions for my participation in the procedure from the performing physician.  

## 2020-09-14 ENCOUNTER — Telehealth: Payer: Self-pay

## 2020-09-14 ENCOUNTER — Telehealth: Payer: Self-pay | Admitting: *Deleted

## 2020-09-14 NOTE — Telephone Encounter (Signed)
Called [phone] and left a message we tried to reach pt for a follow up call. maw

## 2020-09-14 NOTE — Telephone Encounter (Signed)
  Follow up Call-  Call back number 09/10/2020  Post procedure Call Back phone  # [phone]  Permission to leave phone message Yes  Some recent data might be hidden     Patient questions:  Do you have a fever, pain , or abdominal swelling? No. Pain Score  0 *  Have you tolerated food without any problems? Yes.    Have you been able to return to your normal activities? Yes.    Do you have any questions about your discharge instructions: Diet   No. Medications  No. Follow up visit  No.  Do you have questions or concerns about your Care? No.  Actions: * If pain score is 4 or above: No action needed, pain <4.  1. Have you developed a fever since your procedure? no  2.   Have you had an respiratory symptoms (SOB or cough) since your procedure? no  3.   Have you tested positive for COVID 19 since your procedure no  4.   Have you had any family members/close contacts diagnosed with the COVID 19 since your procedure?  no   If yes to any of these questions please route to Joylene Jerrin, RN and Joella Prince, RN

## 2020-09-16 ENCOUNTER — Encounter: Payer: Self-pay | Admitting: Internal Medicine

## 2020-09-16 ENCOUNTER — Other Ambulatory Visit: Payer: Self-pay

## 2020-09-16 DIAGNOSIS — K6389 Other specified diseases of intestine: Secondary | ICD-10-CM

## 2020-09-16 DIAGNOSIS — Z86010 Personal history of colonic polyps: Secondary | ICD-10-CM

## 2020-09-28 ENCOUNTER — Ambulatory Visit (INDEPENDENT_AMBULATORY_CARE_PROVIDER_SITE_OTHER)
Admission: RE | Admit: 2020-09-28 | Discharge: 2020-09-28 | Disposition: A | Discharge status: Home / Self Care | Payer: Medicare Other | Source: Ambulatory Visit | Attending: Internal Medicine | Admitting: Internal Medicine

## 2020-09-28 ENCOUNTER — Other Ambulatory Visit: Payer: Self-pay

## 2020-09-28 DIAGNOSIS — K6389 Other specified diseases of intestine: Secondary | ICD-10-CM

## 2020-09-28 MED ORDER — IOHEXOL 300 MG/ML  SOLN
100.0000 mL | Freq: Once | INTRAMUSCULAR | Status: AC | PRN
  Administered 2020-09-28: 100 mL via INTRAVENOUS

## 2020-10-07 ENCOUNTER — Encounter: Payer: Self-pay | Admitting: Internal Medicine

## 2020-10-07 NOTE — Progress Notes (Signed)
History of Present Illness:       This very nice 75 y.o.  MWM presents for 6 month follow up with HTN, HLD, Pre-Diabetes, Testosterone and Vitamin D Deficiency.  Patient 's GERD is controlled on his meds.      Patient is treated for HTN (1994) & BP has been controlled at home. Today's BP is at goal - 136/84.  Patient had a Normal Heart Cath in 2011. Patient has had no complaints of any cardiac type chest pain, palpitations, dyspnea / orthopnea / PND, dizziness, claudication, or dependent edema.      Hyperlipidemia is controlled with diet /Rosuvastatin. Patient denies myalgias or other med SE's. Last Lipids were at goal:  Lab Results  Component Value Date   CHOL 112 06/28/2020   HDL 66 06/28/2020   LDLCALC 31 06/28/2020   LDLDIRECT 151.0 02/10/2010   TRIG 71 06/28/2020   CHOLHDL 1.7 06/28/2020    Also, the patient is monitored expectantly for glucose intolerance and has had no symptoms of reactive hypoglycemia, diabetic polys, paresthesias or visual blurring.  Last A1c was  Normal & at goal:  Lab Results  Component Value Date   HGBA1C 5.3 03/19/2020       Further, the patient also has history of Vitamin D Deficiency ("38"/2016) and supplements vitamin D without any suspected side-effects. Last vitamin D was at goal:   Lab Results  Component Value Date   VD25OH 72 03/19/2020    Current Outpatient Medications on File Prior to Visit  Medication Sig  . aspirin 81 MG EC tablet Take 8 daily.  . SUPER B COMPLEX  Take 1 tablet by mouth 2 (two) times daily.   Marland Kitchen buPROPion-XL 300 MG 24 hr  TAKE 1 TABLET  DAILY   . cetirizine (ZYRTEC) 10 MG  Take 1 tablet  at bedtime.  . famotidine  40 MG tablet TAKE 1 TABLET  DAILY   . fish oil-omega-3  1000 MG cap Take 1 capsule  2 times daily.  . hydrochlorothiazide  25 MG t TAKE 1 TABLET DAILY   . losartan  100 MG tablet Take 1 tablet Daily for BP  . Magnesium 250 MG TABS Take 1 tablet  2  times daily.  Marland Kitchen ELOCON 0.1 % cream Apply to  affected area 2 x /daily  . Multiple Vitamins-Minerals  Take 1 tablet  daily.    . calcium, magnesium zinc vitamin Takes  2 times a day.  . pantoprazole 40 MG tablet Take 1 tablet Daily   . rosuvastatin  40 MG tablet Take 1 tablet daily   . vitamin C  500 MG tablet Take  daily.  Marland Kitchen VITAMIN D PO Take 5,000 Units 2 times daily.  Marland Kitchen zinc  50 MG tablet Take  daily.    Allergies  Allergen Reactions  . Atenolol   . Atorvastatin   . Darvocet [Propoxyphene N-Acetaminophen]   . Paroxetine   . Pregabalin   . Propoxyphene Hcl     PMHx:   Past Medical History:  Diagnosis Date  . Allergy   . BPH (benign prostatic hypertrophy)   . 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 Erythrocytosis 04/28/2014  . Exertional dyspnea    ETT myoview 5/11: 8 min, no ischemic ECG changes, stopped due to fatigue/ SOB. EF 60%, no ischemia or infarction. Echo 5/11 EF 60%, no significant valvular abnormalities, mild RV dilation and dysfunction, no complete TR doppler jet so PA pressure hard to  gauge. 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 R heart cath 6/11: mean RA 10 mmHg, PA 25/18, mean PCWP 13 mmHg, CI 2.1  . Fibromyalgia   . GERD (gastroesophageal reflux disease)   . History of epididymitis   . History of head injury without skull fracture 1980   Concussion  . Hx of adenomatous polyp of colon 06/19/2007  . Hyperlipidemia    Myalgias with Lipitor  . Hypertension   . Hypogonadism male   . Myofascial pain syndrome, hx   . Nephrolithiasis   . Prolapsed internal hemorrhoids   . Prostatitis    Recurrent  . Rheumatic fever                   x3 at ages [age] and 29; No significant valvular abnormality on 5/11 echo  .  Hx of left bicep   Tendinitis  2000     . Vitamin D deficiency     Immunization History  Administered Date(s) Administered  . Influenza, High Dose Seasonal PF 08/13/2014, 08/31/2015  . Janssen (J&J) SARS-COV-2 Vaccination 02/24/2020  . Pneumococcal  Conjugate-13 08/13/2014  . Pneumococcal Polysaccharide-23 07/12/2012  . Tdap 04/25/2009, 12/02/2019  . Zoster 07/27/2007    Past Surgical History:  Procedure Laterality Date  . BIH REPAIR  08/2005   Incarverated fat by mesh repair  . CARDIAC CATHETERIZATION  02/2010   Left and right  . COLONOSCOPY  06/09/2015   Carlean Purl  . FINGER SURGERY  2000   Repair of injury to right fifth finger  . HEEL SPUR SURGERY  1960   Bilateral  . TONSILLECTOMY  1962    FHx:    Reviewed / unchanged  SHx:    Reviewed / unchanged   Systems Review:  Constitutional: Denies fever, chills, wt changes, headaches, insomnia, fatigue, night sweats, change in appetite. Eyes: Denies redness, blurred vision, diplopia, discharge, itchy, watery eyes.  ENT: Denies discharge, congestion, post nasal drip, epistaxis, sore throat, earache, hearing loss, dental pain, tinnitus, vertigo, sinus pain, snoring.  CV: Denies chest pain, palpitations, irregular heartbeat, syncope, dyspnea, diaphoresis, orthopnea, PND, claudication or edema. Respiratory: denies cough, dyspnea, DOE, pleurisy, hoarseness, laryngitis, wheezing.  Gastrointestinal: Denies dysphagia, odynophagia, heartburn, reflux, water brash, abdominal pain or cramps, nausea, vomiting, bloating, diarrhea, constipation, hematemesis, melena, hematochezia  or hemorrhoids. Genitourinary: Denies dysuria, frequency, urgency, nocturia, hesitancy, discharge, hematuria or flank pain. Musculoskeletal: Denies arthralgias, myalgias, stiffness, jt. swelling, pain, limping or strain/sprain.  Skin: Denies pruritus, rash, hives, warts, acne, eczema or change in skin lesion(s). Neuro: No weakness, tremor, incoordination, spasms, paresthesia or pain. Psychiatric: Denies confusion, memory loss or sensory loss. Endo: Denies change in weight, skin or hair change.  Heme/Lymph: No excessive bleeding, bruising or enlarged lymph nodes.  Physical Exam  BP 136/84   Pulse 74   Temp 97.7 F  (36.5 C)   Wt 215 lb (97.5 kg)   SpO2 94%   BMI 30.85 kg/m   Appears  well nourished, well groomed  and in no distress.  Eyes: PERRLA, EOMs, conjunctiva no swelling or erythema. Sinuses: No frontal/maxillary tenderness ENT/Mouth: EAC's clear, TM's nl w/o erythema, bulging. Nares clear w/o erythema, swelling, exudates. Oropharynx clear without erythema or exudates. Oral hygiene is good. Tongue normal, non obstructing. Hearing intact.  Neck: Supple. Thyroid not palpable. Car 2+/2+ without bruits, nodes or JVD. Chest: Respirations nl with BS clear & equal w/o rales, rhonchi, wheezing or stridor.  Cor: Heart sounds normal w/ regular rate and rhythm without  sig. murmurs, gallops, clicks or rubs. Peripheral pulses normal and equal  without edema.  Abdomen: Soft & bowel sounds normal. Non-tender w/o guarding, rebound, hernias, masses or organomegaly.  Lymphatics: Unremarkable.  Musculoskeletal: Full ROM all peripheral extremities, joint stability, 5/5 strength and normal gait.  Skin: Warm, dry without exposed rashes, lesions or ecchymosis apparent.  Neuro: Cranial nerves intact, reflexes equal bilaterally. Sensory-motor testing grossly intact. Tendon reflexes grossly intact.  Pysch: Alert & oriented x 3.  Insight and judgement nl & appropriate. No ideations.  Assessment and Plan:  1. Essential hypertension  - Continue medication, monitor blood pressure at home.  - Continue DASH diet.  Reminder to go to the ER if any CP,  SOB, nausea, dizziness, severe HA, changes vision/speech.  - CBC with Differential/Platelet - COMPLETE METABOLIC PANEL WITH GFR - Magnesium  2. Hyperlipidemia, mixed  - Continue diet/meds, exercise,& lifestyle modifications.  - Continue monitor periodic cholesterol/liver & renal functions   - Lipid panel  3. Abnormal glucose  - Insulin, random - Hemoglobin A1c  4. Vitamin D deficiency  - Continue diet, exercise  - Lifestyle modifications.  - Monitor  appropriate labs. - Continue supplementation.  - VITAMIN D 25 Hydroxy  5. Gastroesophageal reflux disease  - CBC with Differential/Platelet  6. Medication management  - CBC with Differential/Platelet - COMPLETE METABOLIC PANEL WITH GFR - Magnesium - Lipid panel - TSH - Insulin, random - VITAMIN D 25 Hydroxy - Hemoglobin A1c           Discussed  regular exercise, BP monitoring, weight control to achieve/maintain BMI less than 25 and discussed med and SE's. Recommended labs to assess and monitor clinical status with further disposition pending results of labs.  I discussed the assessment and treatment plan with the patient. The patient was provided an opportunity to ask questions and all were answered. The patient agreed with the plan and demonstrated an understanding of the instructions.  I provided over 30 minutes of exam, counseling, chart review and  complex critical decision making.         The patient was advised to call back or seek an in-person evaluation if the symptoms worsen or if the condition fails to improve as anticipated.   Kirtland Bouchard, MD

## 2020-10-07 NOTE — Patient Instructions (Signed)

## 2020-10-08 ENCOUNTER — Other Ambulatory Visit: Payer: Self-pay

## 2020-10-08 ENCOUNTER — Ambulatory Visit (INDEPENDENT_AMBULATORY_CARE_PROVIDER_SITE_OTHER): Payer: Medicare Other | Admitting: Internal Medicine

## 2020-10-08 ENCOUNTER — Encounter: Payer: Self-pay | Admitting: Internal Medicine

## 2020-10-08 VITALS — BP 136/84 | HR 74 | Temp 97.7°F | Wt 215.0 lb

## 2020-10-08 DIAGNOSIS — I10 Essential (primary) hypertension: Secondary | ICD-10-CM

## 2020-10-08 DIAGNOSIS — E559 Vitamin D deficiency, unspecified: Secondary | ICD-10-CM | POA: Diagnosis not present

## 2020-10-08 DIAGNOSIS — E782 Mixed hyperlipidemia: Secondary | ICD-10-CM | POA: Diagnosis not present

## 2020-10-08 DIAGNOSIS — R7309 Other abnormal glucose: Secondary | ICD-10-CM

## 2020-10-08 DIAGNOSIS — K219 Gastro-esophageal reflux disease without esophagitis: Secondary | ICD-10-CM

## 2020-10-08 DIAGNOSIS — Z79899 Other long term (current) drug therapy: Secondary | ICD-10-CM

## 2020-10-10 NOTE — Progress Notes (Signed)
========================================================== ==========================================================  -     Magnesium  -   1.8  -  STILL  very  low- goal is betw 2.0 - 2.5,   - So..............Marland Kitchen  Recommend that you INCREASE your mag 250 mg to  Magnesium 500 mg tablet 2 x  /day  - also important to eat lots of  leafy green vegetables   - spinach - Kale - collards - greens - okra - asparagus  - broccoli - quinoa - squash - almonds   - black, red, white beans  -  peas - green beans ==========================================================  -  Total Chol = 133 and LDL Chol = 47 - Boh  Excellent   - Very low risk for Heart Attack  / Stroke ========================================================  - But Triglycerides (   212   ) or fats in blood are too high  (goal is less than 150)    - Recommend avoid fried & greasy foods,  sweets / candy,   - Avoid white rice  (brown or wild rice or Quinoa is OK),   - Avoid white potatoes  (sweet potatoes are OK)   - Avoid anything made from white flour  - bagels, doughnuts, rolls, buns, biscuits, white and   wheat breads, pizza crust and traditional  pasta made of white flour & egg white  - (vegetarian pasta or spinach or wheat pasta is OK).    - Multi-grain bread is OK - like multi-grain flat bread or  sandwich thins.   - Avoid alcohol in excess.   - Exercise is also important.  ==========================================================  - TSH is borderline elevated which means thyroid hormone is borderline low, But not enough to start thyroid meds   (yet)  - Will continue to monitor closely at subsequent OV's   - Also , be sure not taking any supplements with Biotin as it "messes up" the  measurement of thyroid & other lab tests causing incorrect interpretation ! ==========================================================  -  Vitamin D = 68 - Excellent  ! ==========================================================  -  A1c - Normal -Great - No Diabetes ! ==========================================================  -  All Else - CBC - Kidneys - Electrolytes - Liver - Magnesium & Thyroid    - all  Normal / OK ===========================================================

## 2020-10-11 ENCOUNTER — Encounter: Payer: Self-pay | Admitting: Internal Medicine

## 2020-10-11 LAB — HEMOGLOBIN A1C
Hgb A1c MFr Bld: 5.3 % of total Hgb (ref <5.7)
Mean Plasma Glucose: 105 mg/dL
eAG (mmol/L): 5.8 mmol/L

## 2020-10-11 LAB — CBC WITH DIFFERENTIAL/PLATELET
Absolute Monocytes: 825 cells/uL (ref 200–950)
Basophils Absolute: 38 cells/uL (ref 0–200)
Basophils Relative: 0.6 %
Eosinophils Absolute: 239 cells/uL (ref 15–500)
Eosinophils Relative: 3.8 %
HCT: 44.4 % (ref 38.5–50.0)
Hemoglobin: 16 g/dL (ref 13.2–17.1)
Lymphs Abs: 1361 cells/uL (ref 850–3900)
MCH: 32.3 pg (ref 27.0–33.0)
MCHC: 36 g/dL (ref 32.0–36.0)
MCV: 89.7 fL (ref 80.0–100.0)
MPV: 11.2 fL (ref 7.5–12.5)
Monocytes Relative: 13.1 %
Neutro Abs: 3837 cells/uL (ref 1500–7800)
Neutrophils Relative %: 60.9 %
Platelets: 173 10*3/uL (ref 140–400)
RBC: 4.95 10*6/uL (ref 4.20–5.80)
RDW: 12.5 % (ref 11.0–15.0)
Total Lymphocyte: 21.6 %
WBC: 6.3 10*3/uL (ref 3.8–10.8)

## 2020-10-11 LAB — COMPLETE METABOLIC PANEL WITH GFR
AG Ratio: 2.3 (calc) (ref 1.0–2.5)
ALT: 31 U/L (ref 9–46)
AST: 26 U/L (ref 10–35)
Albumin: 4.3 g/dL (ref 3.6–5.1)
Alkaline phosphatase (APISO): 57 U/L (ref 35–144)
BUN: 21 mg/dL (ref 7–25)
CO2: 25 mmol/L (ref 20–32)
Calcium: 9.2 mg/dL (ref 8.6–10.3)
Chloride: 101 mmol/L (ref 98–110)
Creat: 1.05 mg/dL (ref 0.70–1.18)
GFR, Est African American: 81 mL/min/{1.73_m2} (ref >=60)
GFR, Est Non African American: 70 mL/min/{1.73_m2} (ref >=60)
Globulin: 1.9 g/dL (calc) (ref 1.9–3.7)
Glucose, Bld: 113 mg/dL — ABNORMAL HIGH (ref 65–99)
Potassium: 3.5 mmol/L (ref 3.5–5.3)
Sodium: 137 mmol/L (ref 135–146)
Total Bilirubin: 0.7 mg/dL (ref 0.2–1.2)
Total Protein: 6.2 g/dL (ref 6.1–8.1)

## 2020-10-11 LAB — LIPID PANEL
Cholesterol: 133 mg/dL (ref <200)
HDL: 58 mg/dL (ref >=40)
LDL Cholesterol (Calc): 47 mg/dL (calc)
Non-HDL Cholesterol (Calc): 75 mg/dL (calc) (ref <130)
Total CHOL/HDL Ratio: 2.3 (calc) (ref <5.0)
Triglycerides: 212 mg/dL — ABNORMAL HIGH (ref <150)

## 2020-10-11 LAB — TSH: TSH: 4.9 mIU/L — ABNORMAL HIGH (ref 0.40–4.50)

## 2020-10-11 LAB — VITAMIN D 25 HYDROXY (VIT D DEFICIENCY, FRACTURES): Vit D, 25-Hydroxy: 68 ng/mL (ref 30–100)

## 2020-10-11 LAB — INSULIN, RANDOM: Insulin: 44.5 u[IU]/mL — ABNORMAL HIGH

## 2020-10-11 LAB — MAGNESIUM: Magnesium: 1.8 mg/dL (ref 1.5–2.5)

## 2020-11-15 ENCOUNTER — Other Ambulatory Visit: Payer: Self-pay | Admitting: Internal Medicine

## 2020-11-15 MED ORDER — OLMESARTAN MEDOXOMIL 40 MG PO TABS: ORAL_TABLET | ORAL | 0 refills | Status: DC

## 2020-11-21 ENCOUNTER — Other Ambulatory Visit: Payer: Self-pay | Admitting: Adult Health

## 2020-11-24 ENCOUNTER — Telehealth: Payer: Self-pay | Admitting: *Deleted

## 2020-11-24 NOTE — Telephone Encounter (Signed)
Spouse called with concern about patient's upcoming appendectomy and colon surgery at [facility]. Per Dr Melford Aase, he is not familiar enough with this type of problem and suggested they speak with surgeon. He also suggested they ask about oncology options. Spouse is aware and related he may opt for appendectomy at this time. Consult appointment at [facility] to discuss upcoming procedure.

## 2021-01-12 ENCOUNTER — Ambulatory Visit: Payer: Medicare Other | Admitting: Adult Health Nurse Practitioner

## 2021-01-14 ENCOUNTER — Ambulatory Visit: Payer: Medicare Other | Admitting: Adult Health

## 2021-01-27 ENCOUNTER — Other Ambulatory Visit: Payer: Self-pay | Admitting: Internal Medicine

## 2021-01-27 DIAGNOSIS — E782 Mixed hyperlipidemia: Secondary | ICD-10-CM

## 2021-02-07 ENCOUNTER — Other Ambulatory Visit: Payer: Self-pay | Admitting: Internal Medicine

## 2021-02-07 DIAGNOSIS — K219 Gastro-esophageal reflux disease without esophagitis: Secondary | ICD-10-CM

## 2021-02-12 ENCOUNTER — Other Ambulatory Visit: Payer: Self-pay | Admitting: Internal Medicine

## 2021-03-20 ENCOUNTER — Inpatient Hospital Stay
Admission: RE | Admit: 2021-03-20 | Discharge: 2021-04-11 | Disposition: A | Discharge status: Hospice | Payer: Self-pay | Source: Ambulatory Visit | Attending: Internal Medicine | Admitting: Internal Medicine

## 2021-03-20 ENCOUNTER — Other Ambulatory Visit (HOSPITAL_COMMUNITY): Payer: Self-pay

## 2021-03-20 DIAGNOSIS — R609 Edema, unspecified: Secondary | ICD-10-CM

## 2021-03-20 DIAGNOSIS — R509 Fever, unspecified: Secondary | ICD-10-CM

## 2021-03-20 DIAGNOSIS — Z452 Encounter for adjustment and management of vascular access device: Secondary | ICD-10-CM

## 2021-03-20 DIAGNOSIS — J9690 Respiratory failure, unspecified, unspecified whether with hypoxia or hypercapnia: Secondary | ICD-10-CM

## 2021-03-20 DIAGNOSIS — R059 Cough, unspecified: Secondary | ICD-10-CM

## 2021-03-20 DIAGNOSIS — D369 Benign neoplasm, unspecified site: Secondary | ICD-10-CM

## 2021-03-21 ENCOUNTER — Other Ambulatory Visit (HOSPITAL_COMMUNITY): Payer: Self-pay

## 2021-03-21 LAB — COMPREHENSIVE METABOLIC PANEL
ALT: 38 U/L (ref 0–44)
AST: 27 U/L (ref 15–41)
Albumin: 1.7 g/dL — ABNORMAL LOW (ref 3.5–5.0)
Alkaline Phosphatase: 142 U/L — ABNORMAL HIGH (ref 38–126)
Anion gap: 6 (ref 5–15)
BUN: 16 mg/dL (ref 8–23)
CO2: 24 mmol/L (ref 22–32)
Calcium: 8.8 mg/dL — ABNORMAL LOW (ref 8.9–10.3)
Chloride: 104 mmol/L (ref 98–111)
Creatinine, Ser: 0.66 mg/dL (ref 0.61–1.24)
GFR, Estimated: >60 mL/min (ref >60)
Glucose, Bld: 141 mg/dL — ABNORMAL HIGH (ref 70–99)
Potassium: 3.7 mmol/L (ref 3.5–5.1)
Sodium: 134 mmol/L — ABNORMAL LOW (ref 135–145)
Total Bilirubin: 0.4 mg/dL (ref 0.3–1.2)
Total Protein: 5.4 g/dL — ABNORMAL LOW (ref 6.5–8.1)

## 2021-03-21 LAB — CBC WITH DIFFERENTIAL/PLATELET
Abs Immature Granulocytes: 0.97 K/uL — ABNORMAL HIGH (ref 0.00–0.07)
Basophils Absolute: 0.1 K/uL (ref 0.0–0.1)
Basophils Relative: 1 %
Eosinophils Absolute: 0.5 K/uL (ref 0.0–0.5)
Eosinophils Relative: 2 %
HCT: 23.4 % — ABNORMAL LOW (ref 39.0–52.0)
Hemoglobin: 7.2 g/dL — ABNORMAL LOW (ref 13.0–17.0)
Immature Granulocytes: 5 %
Lymphocytes Relative: 17 %
Lymphs Abs: 3.1 K/uL (ref 0.7–4.0)
MCH: 25.3 pg — ABNORMAL LOW (ref 26.0–34.0)
MCHC: 30.8 g/dL (ref 30.0–36.0)
MCV: 82.1 fL (ref 80.0–100.0)
Monocytes Absolute: 2.5 K/uL — ABNORMAL HIGH (ref 0.1–1.0)
Monocytes Relative: 13 %
Neutro Abs: 11.7 K/uL — ABNORMAL HIGH (ref 1.7–7.7)
Neutrophils Relative %: 62 %
Platelets: 529 K/uL — ABNORMAL HIGH (ref 150–400)
RBC: 2.85 MIL/uL — ABNORMAL LOW (ref 4.22–5.81)
RDW: 17.6 % — ABNORMAL HIGH (ref 11.5–15.5)
WBC: 18.8 K/uL — ABNORMAL HIGH (ref 4.0–10.5)
nRBC: 2.8 % — ABNORMAL HIGH (ref 0.0–0.2)

## 2021-03-21 LAB — PHOSPHORUS: Phosphorus: 3.8 mg/dL (ref 2.5–4.6)

## 2021-03-21 LAB — MAGNESIUM: Magnesium: 2 mg/dL (ref 1.7–2.4)

## 2021-03-21 LAB — TRIGLYCERIDES: Triglycerides: 140 mg/dL (ref <150)

## 2021-03-22 ENCOUNTER — Encounter: Payer: Medicare Other | Admitting: Internal Medicine

## 2021-03-23 ENCOUNTER — Other Ambulatory Visit (HOSPITAL_COMMUNITY): Payer: Self-pay

## 2021-03-23 LAB — CBC
HCT: 22.7 % — ABNORMAL LOW (ref 39.0–52.0)
Hemoglobin: 7.1 g/dL — ABNORMAL LOW (ref 13.0–17.0)
MCH: 25.7 pg — ABNORMAL LOW (ref 26.0–34.0)
MCHC: 31.3 g/dL (ref 30.0–36.0)
MCV: 82.2 fL (ref 80.0–100.0)
Platelets: 425 10*3/uL — ABNORMAL HIGH (ref 150–400)
RBC: 2.76 MIL/uL — ABNORMAL LOW (ref 4.22–5.81)
RDW: 18 % — ABNORMAL HIGH (ref 11.5–15.5)
WBC: 23.7 10*3/uL — ABNORMAL HIGH (ref 4.0–10.5)
nRBC: 2.7 % — ABNORMAL HIGH (ref 0.0–0.2)

## 2021-03-23 LAB — MAGNESIUM: Magnesium: 1.4 mg/dL — ABNORMAL LOW (ref 1.7–2.4)

## 2021-03-23 LAB — BASIC METABOLIC PANEL
Anion gap: 8 (ref 5–15)
BUN: 16 mg/dL (ref 8–23)
CO2: 22 mmol/L (ref 22–32)
Calcium: 9 mg/dL (ref 8.9–10.3)
Chloride: 99 mmol/L (ref 98–111)
Creatinine, Ser: 0.76 mg/dL (ref 0.61–1.24)
GFR, Estimated: >60 mL/min (ref >60)
Glucose, Bld: 137 mg/dL — ABNORMAL HIGH (ref 70–99)
Potassium: 3.5 mmol/L (ref 3.5–5.1)
Sodium: 129 mmol/L — ABNORMAL LOW (ref 135–145)

## 2021-03-24 LAB — BASIC METABOLIC PANEL
Anion gap: 8 (ref 5–15)
BUN: 14 mg/dL (ref 8–23)
CO2: 23 mmol/L (ref 22–32)
Calcium: 9.1 mg/dL (ref 8.9–10.3)
Chloride: 97 mmol/L — ABNORMAL LOW (ref 98–111)
Creatinine, Ser: 0.76 mg/dL (ref 0.61–1.24)
GFR, Estimated: >60 mL/min (ref >60)
Glucose, Bld: 118 mg/dL — ABNORMAL HIGH (ref 70–99)
Potassium: 3.8 mmol/L (ref 3.5–5.1)
Sodium: 128 mmol/L — ABNORMAL LOW (ref 135–145)

## 2021-03-24 LAB — MAGNESIUM
Magnesium: 1.6 mg/dL — ABNORMAL LOW (ref 1.7–2.4)
Magnesium: 1.7 mg/dL (ref 1.7–2.4)

## 2021-03-24 LAB — CBC
HCT: 23.5 % — ABNORMAL LOW (ref 39.0–52.0)
Hemoglobin: 7.2 g/dL — ABNORMAL LOW (ref 13.0–17.0)
MCH: 25 pg — ABNORMAL LOW (ref 26.0–34.0)
MCHC: 30.6 g/dL (ref 30.0–36.0)
MCV: 81.6 fL (ref 80.0–100.0)
Platelets: 565 10*3/uL — ABNORMAL HIGH (ref 150–400)
RBC: 2.88 MIL/uL — ABNORMAL LOW (ref 4.22–5.81)
RDW: 17.8 % — ABNORMAL HIGH (ref 11.5–15.5)
WBC: 20.2 10*3/uL — ABNORMAL HIGH (ref 4.0–10.5)
nRBC: 2.6 % — ABNORMAL HIGH (ref 0.0–0.2)

## 2021-03-24 LAB — TRIGLYCERIDES: Triglycerides: 88 mg/dL (ref <150)

## 2021-03-24 LAB — PHOSPHORUS: Phosphorus: 3.7 mg/dL (ref 2.5–4.6)

## 2021-03-26 LAB — MAGNESIUM
Magnesium: 1.7 mg/dL (ref 1.7–2.4)
Magnesium: 2.1 mg/dL (ref 1.7–2.4)

## 2021-03-27 LAB — PHOSPHORUS: Phosphorus: 4 mg/dL (ref 2.5–4.6)

## 2021-03-27 LAB — TRIGLYCERIDES: Triglycerides: 123 mg/dL (ref <150)

## 2021-03-27 LAB — MAGNESIUM: Magnesium: 2 mg/dL (ref 1.7–2.4)

## 2021-03-29 ENCOUNTER — Other Ambulatory Visit (HOSPITAL_COMMUNITY): Payer: Self-pay

## 2021-03-29 LAB — BASIC METABOLIC PANEL
Anion gap: 8 (ref 5–15)
BUN: 16 mg/dL (ref 8–23)
CO2: 25 mmol/L (ref 22–32)
Calcium: 9.6 mg/dL (ref 8.9–10.3)
Chloride: 96 mmol/L — ABNORMAL LOW (ref 98–111)
Creatinine, Ser: 0.86 mg/dL (ref 0.61–1.24)
GFR, Estimated: >60 mL/min (ref >60)
Glucose, Bld: 120 mg/dL — ABNORMAL HIGH (ref 70–99)
Potassium: 3.9 mmol/L (ref 3.5–5.1)
Sodium: 129 mmol/L — ABNORMAL LOW (ref 135–145)

## 2021-03-29 LAB — CBC
HCT: 25.3 % — ABNORMAL LOW (ref 39.0–52.0)
Hemoglobin: 7.7 g/dL — ABNORMAL LOW (ref 13.0–17.0)
MCH: 24.4 pg — ABNORMAL LOW (ref 26.0–34.0)
MCHC: 30.4 g/dL (ref 30.0–36.0)
MCV: 80.3 fL (ref 80.0–100.0)
Platelets: 706 10*3/uL — ABNORMAL HIGH (ref 150–400)
RBC: 3.15 MIL/uL — ABNORMAL LOW (ref 4.22–5.81)
RDW: 18.1 % — ABNORMAL HIGH (ref 11.5–15.5)
WBC: 15.7 10*3/uL — ABNORMAL HIGH (ref 4.0–10.5)
nRBC: 0.3 % — ABNORMAL HIGH (ref 0.0–0.2)

## 2021-03-30 LAB — BASIC METABOLIC PANEL
Anion gap: 5 (ref 5–15)
BUN: 16 mg/dL (ref 8–23)
CO2: 26 mmol/L (ref 22–32)
Calcium: 9.5 mg/dL (ref 8.9–10.3)
Chloride: 101 mmol/L (ref 98–111)
Creatinine, Ser: 0.97 mg/dL (ref 0.61–1.24)
GFR, Estimated: >60 mL/min (ref >60)
Glucose, Bld: 114 mg/dL — ABNORMAL HIGH (ref 70–99)
Potassium: 4.3 mmol/L (ref 3.5–5.1)
Sodium: 132 mmol/L — ABNORMAL LOW (ref 135–145)

## 2021-03-30 LAB — MAGNESIUM: Magnesium: 1.8 mg/dL (ref 1.7–2.4)

## 2021-03-30 LAB — TRIGLYCERIDES: Triglycerides: 115 mg/dL (ref <150)

## 2021-03-31 LAB — PHOSPHORUS: Phosphorus: 3.7 mg/dL (ref 2.5–4.6)

## 2021-04-01 LAB — CBC
HCT: 24.5 % — ABNORMAL LOW (ref 39.0–52.0)
Hemoglobin: 7.3 g/dL — ABNORMAL LOW (ref 13.0–17.0)
MCH: 24.5 pg — ABNORMAL LOW (ref 26.0–34.0)
MCHC: 29.8 g/dL — ABNORMAL LOW (ref 30.0–36.0)
MCV: 82.2 fL (ref 80.0–100.0)
Platelets: 547 10*3/uL — ABNORMAL HIGH (ref 150–400)
RBC: 2.98 MIL/uL — ABNORMAL LOW (ref 4.22–5.81)
RDW: 18.5 % — ABNORMAL HIGH (ref 11.5–15.5)
WBC: 7.4 10*3/uL (ref 4.0–10.5)
nRBC: 0.4 % — ABNORMAL HIGH (ref 0.0–0.2)

## 2021-04-01 LAB — BASIC METABOLIC PANEL
Anion gap: 5 (ref 5–15)
BUN: 17 mg/dL (ref 8–23)
CO2: 24 mmol/L (ref 22–32)
Calcium: 9.1 mg/dL (ref 8.9–10.3)
Chloride: 105 mmol/L (ref 98–111)
Creatinine, Ser: 0.85 mg/dL (ref 0.61–1.24)
GFR, Estimated: >60 mL/min (ref >60)
Glucose, Bld: 138 mg/dL — ABNORMAL HIGH (ref 70–99)
Potassium: 3.5 mmol/L (ref 3.5–5.1)
Sodium: 134 mmol/L — ABNORMAL LOW (ref 135–145)

## 2021-04-01 LAB — MAGNESIUM: Magnesium: 1.5 mg/dL — ABNORMAL LOW (ref 1.7–2.4)

## 2021-04-02 LAB — MAGNESIUM: Magnesium: 1.9 mg/dL (ref 1.7–2.4)

## 2021-04-02 LAB — TRIGLYCERIDES: Triglycerides: 84 mg/dL (ref <150)

## 2021-04-03 LAB — CULTURE, BLOOD (ROUTINE X 2)
Culture: NO GROWTH
Culture: NO GROWTH
Special Requests: ADEQUATE
Special Requests: ADEQUATE

## 2021-04-03 LAB — PHOSPHORUS: Phosphorus: 2.1 mg/dL — ABNORMAL LOW (ref 2.5–4.6)

## 2021-04-04 LAB — BASIC METABOLIC PANEL
Anion gap: 8 (ref 5–15)
BUN: 17 mg/dL (ref 8–23)
CO2: 22 mmol/L (ref 22–32)
Calcium: 8.9 mg/dL (ref 8.9–10.3)
Chloride: 104 mmol/L (ref 98–111)
Creatinine, Ser: 0.9 mg/dL (ref 0.61–1.24)
GFR, Estimated: >60 mL/min (ref >60)
Glucose, Bld: 136 mg/dL — ABNORMAL HIGH (ref 70–99)
Potassium: 3.3 mmol/L — ABNORMAL LOW (ref 3.5–5.1)
Sodium: 134 mmol/L — ABNORMAL LOW (ref 135–145)

## 2021-04-05 ENCOUNTER — Other Ambulatory Visit (HOSPITAL_COMMUNITY): Payer: Self-pay

## 2021-04-05 LAB — MAGNESIUM: Magnesium: 1.9 mg/dL (ref 1.7–2.4)

## 2021-04-05 LAB — TRIGLYCERIDES: Triglycerides: 117 mg/dL (ref <150)

## 2021-04-06 LAB — CBC
HCT: 22.3 % — ABNORMAL LOW (ref 39.0–52.0)
Hemoglobin: 6.8 g/dL — CL (ref 13.0–17.0)
MCH: 23.9 pg — ABNORMAL LOW (ref 26.0–34.0)
MCHC: 30.5 g/dL (ref 30.0–36.0)
MCV: 78.5 fL — ABNORMAL LOW (ref 80.0–100.0)
Platelets: 480 10*3/uL — ABNORMAL HIGH (ref 150–400)
RBC: 2.84 MIL/uL — ABNORMAL LOW (ref 4.22–5.81)
RDW: 18.4 % — ABNORMAL HIGH (ref 11.5–15.5)
WBC: 3.3 10*3/uL — ABNORMAL LOW (ref 4.0–10.5)
nRBC: 1.5 % — ABNORMAL HIGH (ref 0.0–0.2)

## 2021-04-06 LAB — POTASSIUM: Potassium: 3.4 mmol/L — ABNORMAL LOW (ref 3.5–5.1)

## 2021-04-06 LAB — PREPARE RBC (CROSSMATCH)

## 2021-04-06 LAB — ABO/RH: ABO/RH(D): O NEG

## 2021-04-06 LAB — PHOSPHORUS: Phosphorus: 2.7 mg/dL (ref 2.5–4.6)

## 2021-04-07 LAB — CBC
HCT: 26.8 % — ABNORMAL LOW (ref 39.0–52.0)
Hemoglobin: 7.9 g/dL — ABNORMAL LOW (ref 13.0–17.0)
MCH: 24.1 pg — ABNORMAL LOW (ref 26.0–34.0)
MCHC: 29.5 g/dL — ABNORMAL LOW (ref 30.0–36.0)
MCV: 81.7 fL (ref 80.0–100.0)
Platelets: 407 10*3/uL — ABNORMAL HIGH (ref 150–400)
RBC: 3.28 MIL/uL — ABNORMAL LOW (ref 4.22–5.81)
RDW: 18.7 % — ABNORMAL HIGH (ref 11.5–15.5)
WBC: 8.2 10*3/uL (ref 4.0–10.5)
nRBC: 1.8 % — ABNORMAL HIGH (ref 0.0–0.2)

## 2021-04-07 LAB — BASIC METABOLIC PANEL
Anion gap: 8 (ref 5–15)
BUN: 14 mg/dL (ref 8–23)
CO2: 24 mmol/L (ref 22–32)
Calcium: 9.1 mg/dL (ref 8.9–10.3)
Chloride: 101 mmol/L (ref 98–111)
Creatinine, Ser: 0.86 mg/dL (ref 0.61–1.24)
GFR, Estimated: >60 mL/min (ref >60)
Glucose, Bld: 132 mg/dL — ABNORMAL HIGH (ref 70–99)
Potassium: 3.7 mmol/L (ref 3.5–5.1)
Sodium: 133 mmol/L — ABNORMAL LOW (ref 135–145)

## 2021-04-07 LAB — TYPE AND SCREEN
ABO/RH(D): O NEG
Antibody Screen: NEGATIVE
Unit division: 0

## 2021-04-07 LAB — BPAM RBC
Blood Product Expiration Date: 202207182359
ISSUE DATE / TIME: 202207131457
Unit Type and Rh: 9500

## 2021-04-08 LAB — MAGNESIUM
Magnesium: 1.7 mg/dL (ref 1.7–2.4)
Magnesium: 1.9 mg/dL (ref 1.7–2.4)

## 2021-04-08 LAB — HEMOGLOBIN AND HEMATOCRIT, BLOOD
HCT: 25.1 % — ABNORMAL LOW (ref 39.0–52.0)
Hemoglobin: 7.6 g/dL — ABNORMAL LOW (ref 13.0–17.0)

## 2021-04-08 LAB — TRIGLYCERIDES: Triglycerides: 125 mg/dL (ref <150)

## 2021-04-09 LAB — PHOSPHORUS: Phosphorus: 2.7 mg/dL (ref 2.5–4.6)

## 2021-04-10 LAB — BASIC METABOLIC PANEL
Anion gap: 5 (ref 5–15)
BUN: 14 mg/dL (ref 8–23)
CO2: 25 mmol/L (ref 22–32)
Calcium: 8.8 mg/dL — ABNORMAL LOW (ref 8.9–10.3)
Chloride: 102 mmol/L (ref 98–111)
Creatinine, Ser: 0.76 mg/dL (ref 0.61–1.24)
GFR, Estimated: >60 mL/min (ref >60)
Glucose, Bld: 117 mg/dL — ABNORMAL HIGH (ref 70–99)
Potassium: 3.6 mmol/L (ref 3.5–5.1)
Sodium: 132 mmol/L — ABNORMAL LOW (ref 135–145)

## 2021-04-11 ENCOUNTER — Telehealth: Payer: Self-pay

## 2021-04-11 LAB — MAGNESIUM: Magnesium: 1.4 mg/dL — ABNORMAL LOW (ref 1.7–2.4)

## 2021-04-11 LAB — TRIGLYCERIDES: Triglycerides: 147 mg/dL (ref <150)

## 2021-04-11 NOTE — Telephone Encounter (Signed)
Spoke with patient's wife Lovey Newcomer and scheduled an in-person Palliative Consult for 04/13/21 @ 11AM. Faythe Ghee per Phineas Real.   COVID screening was negative. No pets in home. Patient lives with wife.   Consent obtained; updated Outlook/Netsmart/Team List and Epic.   Family is aware they may be receiving a call from NP the day before or day of to confirm appointment.

## 2021-04-12 ENCOUNTER — Telehealth: Payer: Self-pay

## 2021-04-12 ENCOUNTER — Other Ambulatory Visit: Payer: Self-pay | Admitting: Adult Health

## 2021-04-12 MED ORDER — PROMETHAZINE HCL 25 MG RE SUPP: 25.0000 mg | Freq: Four times a day (QID) | RECTAL | 1 refills | Status: DC | PRN

## 2021-04-12 MED ORDER — ONDANSETRON HCL 4 MG PO TABS: 4.0000 mg | ORAL_TABLET | Freq: Three times a day (TID) | ORAL | 0 refills | Status: DC | PRN

## 2021-04-12 NOTE — Telephone Encounter (Signed)
Patient is requesting a prescription for nausea.  Has a hospital follow up appointment next week. Please advise.

## 2021-04-12 NOTE — Progress Notes (Signed)
Patient family contacted office back, patient has ongoing nausea since recent hospitalization (recurrent hospitalizations since April 2022). She reports zofran cost prohibitive and unable to tolerate pills. Discussed phenergan suppositories and sent in. She reports home health nurse will be coming by tomorrow for home evaluation. Appears he has been referred for palliative/hospice. Encouraged patient family to call back with any further concerns in the interim. Dr. Melford Aase updated and in agreement.

## 2021-04-13 ENCOUNTER — Other Ambulatory Visit: Payer: Self-pay

## 2021-04-13 ENCOUNTER — Other Ambulatory Visit: Payer: Medicare Other | Admitting: Student

## 2021-04-13 DIAGNOSIS — Z789 Other specified health status: Secondary | ICD-10-CM

## 2021-04-13 DIAGNOSIS — R52 Pain, unspecified: Secondary | ICD-10-CM

## 2021-04-13 DIAGNOSIS — R600 Localized edema: Secondary | ICD-10-CM

## 2021-04-13 DIAGNOSIS — R509 Fever, unspecified: Secondary | ICD-10-CM

## 2021-04-13 DIAGNOSIS — R112 Nausea with vomiting, unspecified: Secondary | ICD-10-CM

## 2021-04-13 DIAGNOSIS — Z515 Encounter for palliative care: Secondary | ICD-10-CM

## 2021-04-13 NOTE — Progress Notes (Signed)
[facility] Pall[facility] Consult Note Telephone: [phone]  Fax: [phone]   Date of encounter: 04/13/21 PATIENT NAME: Curtis Price [id number]   [phone] (home) [phone] (work) DOB: October 05, 1945 MRN: [id number] PRIMARY CARE PROVIDER:    [name], MD,  [address] [place] [address] [phone]  REFERRING PROVIDER:   [name], [place],  [place] [address] [phone]  RESPONSIBLE PARTY:    Contact Information     Name Relation Home Work Mobile   Jani,Sandy  [phone]  [phone]        I met face to face with patient and family in the home. Palliative Care was asked to follow this patient by consultation request of  [name], MD to address advance care planning and complex medical decision making. This is the initial visit.                                     ASSESSMENT AND PLAN / RECOMMENDATIONS:   Advance Care Planning/Goals of Care: Goals include to maximize quality of life and symptom management. Patient/health care surrogate gave his/her permission to discuss.Our advance care planning conversation included a discussion about:    The value and importance of advance care planning  Experiences with loved ones who have been seriously ill or have died  Exploration of personal, cultural or spiritual beliefs that might influence medical decisions  Exploration of goals of care in the event of a sudden injury or illness  Identification and preparation of a healthcare agent  Introduced MOST form; reviewed and will complete on next visit.  CODE STATUS: DNR   Discussed palliative medicine vs. Hospice services. Discussed eligibility requirements for hospice. Patient and wife state they would like to continue TPN. Discussed patient with hospice physician; due to patient wanting to continue TPN he would not be  eligible for hospice at this time. Should he stop TPN, he would be eligible for hospice given his diagnoses, poor prognosis. He states he would like to continue DNR, he is open to returning to hospital as needed.   Symptom Management/Plan:  Nausea-patient with persistent nausea. Promethazine suppositories called in per PCP yesterday to pharmacy, but not in stock. Prescription sent to [facility] [address] as they have in stock. Also sent script for lorazepam 1 mg every 8 hours PRN for nausea/anxiety. If nausea is managed, patient should be able to resume his routine oral medications.   Pain/fever-patient continues to have elevated temperatures. [facility] acetaminophen suppository 650 mg PR every 4 hours PRN fever/pain.   TPN- currently has [place] from [facility]; continue as directed.   LE edema-patient encouraged to elevate legs while in recliner.  Palliative Nurse Navigator notified and she is working on additional [facility] for PICC line care, wound care, ostomy/drainage care, patient family teaching/education needed. She is also checking to see if patient should be on IV antibiotics (Zosyn) and Lovenox as none in the home, but on discharge medication list provided by wife. She is also checking on in home providers as patient cannot tolerate going out of home.    Follow up Palliative Care Visit: Palliative care will continue to follow for complex medical decision making, advance care planning, and clarification of goals. Follow up phone call in 2 weeks, Return visit in 4  weeks or prn.  I spent 90 minutes providing this consultation. More than 50% of the time in this consultation was spent in counseling and care coordination.  PPS: 30%  HOSPICE ELIGIBILITY/DIAGNOSIS: TBD  Chief Complaint: Palliative Medicine initial consult.   HISTORY OF PRESENT ILLNESS:  [name]. is a 75 y.o. 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 Patient with anastomotic leak, status post exploratory laparotomy with abdominal washout and placement of drain. Diagnoses also include protein calorie malnutrition with poor intake secondary to nausea and vomiting, patient continues on TPN; acute kidney injury, acute blood loss anemia, hypertension, depression, hypomagnesemia and SIRS.   Patient with complex medical history. He reports having surgery at [facility] on 4/5; stayed in hospital for  30 days. He and wife report he was home around 7-10 days the back to [facility] until he was transferred to [facility] on 03/21/21. He was at [facility] until discharging home on 04/11/21. Patient and wife report complications during hospital stays such as leak of anastomosis between gastrointestinal structures, abscess developed at drain site. Patient with persistent nausea and vomiting. Not able to tolerate almost anything by mouth. He has not been able to take most routine medications as he vomits them back up. He is receiving TPN. Nurse from [facility] has come out to provide instruction; wife states she will come for 4 visits.   Patient reports having little pain. He has been spiking fevers up to 101.4, mostly in the evenings. He is taking small sips. He endorses a 40 pound weight loss since April; current weight around 175 pounds, previously 215 pounds. He endorses generalized weakness. He is only able to stand and pivot. Prior to April, he was working 7 days a week, independent for all adl's. Denies shortness of breath. Does report edema to BLE.   Patient received resting in recliner, ill appearing. He is hard of hearing with hearing aid present. He has dressings to lower abdomen below umbilicus, right lower quadrant, ostomy with drainage bag to right middle quadrant. Drainage bag with brown liquid present. Emptied during visit. PICC line to right upper arm; dressing last  changed on 04/08/21.   Patient is receiving [facility] for TPN. NP spoke with company to see if they are able to provide PICC line care, support teaching with ostomy, drain, wound care. Representative states they do not usually provide wound care, although they can exist with another [facility] to provide wound care. Wife also had packet from [facility] [phone]. Patient has ordered to continue Zosyn through 04/19/21 and Lovenox through 06/27/21 per discharge instructions. Wife states excoriation to buttocks has improved; she is applying Calmoseptine or zinc oxide ointment.    History obtained from review of EMR, discussion with primary team, and interview with family, facility staff/caregiver and/or Mr. Beaudin.  I reviewed available labs, medications, imaging, studies and related documents from the EMR.  Records reviewed and summarized above.   ROS  A 10 Point review of systems is negative, except for the pertinent positives and negatives detailed in the HPI.    Physical Exam: Pulse 96, resp 20, sats 97% on room air Constitutional: NAD General: frail appearing, ill appearing EYES: anicteric sclera, lids intact, no discharge  ENMT: oral mucous membranes dry, dentition intact CV: S1S2, RRR, 1-2+ LE edema Pulmonary: LCTA, no increased work of breathing, no cough, room air Abdomen: hyper-active BS +  4 quadrants, soft and non tender, no ascites GU: deferred MSK: moves all extremities, weakness Skin: warm and dry, no rashes or wounds on visible skin Neuro: generalized weakness, A & O x 3 Psych: non-anxious affect Hem/lymph/immuno: no widespread bruising CURRENT PROBLEM LIST:  Patient Active Problem List   Diagnosis Date Noted   Abnormality of colon-appendix on colonoscopy 09/10/2020   Polycythemia 05/29/2019   Dysthymia 05/28/2019   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   Obesity (BMI 30.0-34.9) 09/05/2015   Abnormal glucose 08/31/2015   Erectile dysfunction 08/31/2015    Elevated serum immunoglobulin free light chains 04/28/2014   Medication management 11/19/2013   Benign prostatic hyperplasia    GERD    Fibromyalgia    Testosterone Deficiency    Hyperlipidemia, mixed    Vitamin D deficiency    Essential hypertension 02/15/2010   Hx of adenomatous polyp of colon 06/19/2007   PAST MEDICAL HISTORY:  Active Ambulatory Problems    Diagnosis Date Noted   Essential hypertension 02/15/2010   Benign prostatic hyperplasia    GERD    Fibromyalgia    Testosterone Deficiency    Hyperlipidemia, mixed    Vitamin D deficiency    Medication management 11/19/2013   Elevated serum immunoglobulin free light chains 04/28/2014   Hx of adenomatous polyp of colon 06/19/2007   Abnormal glucose 08/31/2015   Erectile dysfunction 08/31/2015   Obesity (BMI 30.0-34.9) 09/05/2015   Dysthymia 05/28/2019   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   Polycythemia 05/29/2019   Abnormality of colon-appendix on colonoscopy 09/10/2020   Resolved Ambulatory Problems    Diagnosis Date Noted   Right flank pain 05/28/2019   Past Medical History:  Diagnosis Date   Allergy    BPH (benign prostatic hypertrophy)    Erythrocytosis 04/28/2014   Exertional dyspnea    History of epididymitis    History of head injury without skull fracture 1980   Hyperlipidemia    Hypertension    Myofascial pain syndrome    Nephrolithiasis    Prolapsed internal hemorrhoids    Prostatitis    Rheumatic fever    Tendinitis 2000   SOCIAL HX:  Social History   Tobacco Use   Smoking status: Never   Smokeless tobacco: Never  Substance Use Topics   Alcohol use: Yes    Alcohol/week: 12.0 standard drinks    Types: 12 Cans of beer per week    Comment: beer and liquoer   FAMILY HX:  Family History  Problem Relation Age of Onset   Hypertension Mother    Breast cancer Mother    Aortic stenosis Father 66   Hypertension Other    Aortic stenosis Other    Breast cancer Other    Diabetes Other     Other Other        ASHD and epilepsy   Stroke Neg Hx    Colon cancer Neg Hx    Colon polyps Neg Hx    Esophageal cancer Neg Hx    Rectal cancer Neg Hx    Stomach cancer Neg Hx       ALLERGIES:  Allergies  Allergen Reactions   Atenolol    Atorvastatin    Darvocet [Propoxyphene N-Acetaminophen]    Paroxetine    Pregabalin    Propoxyphene Hcl      PERTINENT MEDICATIONS:  Outpatient Encounter Medications as of 04/13/2021  Medication Sig   aspirin 81 MG EC tablet Take 81 mg by mouth daily.   B Complex-C (SUPER B COMPLEX PO) Take 1 tablet  by mouth 2 (two) times daily.    buPROPion (WELLBUTRIN XL) 300 MG 24 hr tablet Take  1 tablet  Daily for Mood            TAKE 1 TABLET BY MOUTH DAILY FOR MOOD   cetirizine (ZYRTEC) 10 MG tablet Take 1 tablet (10 mg total) by mouth at bedtime.   famotidine (PEPCID) 40 MG tablet TAKE 1 TABLET BY MOUTH ONCE DAILY FOR INDIGESTION AND HEARTBURN   fish oil-omega-3 fatty acids 1000 MG capsule Take 1 capsule by mouth 2 (two) times daily.   hydrochlorothiazide (HYDRODIURIL) 25 MG tablet TAKE 1 TABLET BY MOUTH ONCE DAILY FOR BLOOD PRESSURE AND FLUID RETENTION   Magnesium 250 MG TABS Take 1 tablet by mouth 2 (two) times daily.   Multiple Vitamins-Minerals (STRESS B COMPLEX/ZINC) TABS Take 1 tablet by mouth daily.   olmesartan (BENICAR) 40 MG tablet TAKE 1 TABLET DAILY FOR BLOOD PRESSURE   ondansetron (ZOFRAN) 4 MG tablet Take 1 tablet (4 mg total) by mouth every 8 (eight) hours as needed for nausea or vomiting.   OVER THE COUNTER MEDICATION Takes calcium, magnesium zinc vitamin 2 times a day.   pantoprazole (PROTONIX) 40 MG tablet TAKE 1 TABLET DAILY FOR INDIGESTION & HEARTBURN   promethazine (PHENERGAN) 25 MG suppository Place 1 suppository (25 mg total) rectally every 6 (six) hours as needed for nausea or vomiting.   rosuvastatin (CRESTOR) 40 MG tablet Take 1 tablet daily for Cholesterol   vitamin C (ASCORBIC ACID) 500 MG tablet Take 500 mg by mouth daily.    VITAMIN D PO Take 5,000 Units by mouth 2 (two) times daily.   zinc gluconate 50 MG tablet Take 50 mg by mouth daily.   No facility-administered encounter medications on file as of 04/13/2021.   Thank you for the opportunity to participate in the care of Mr. Trevor.  The palliative care team will continue to follow. Please call our office at [phone] if we can be of additional assistance.   Ezekiel Slocumb, NP   COVID-19 PATIENT SCREENING TOOL Asked and negative response unless otherwise noted:  Have you had symptoms of covid, tested positive or been in contact with someone with symptoms/positive test in the past 5-10 days? No

## 2021-04-15 ENCOUNTER — Other Ambulatory Visit: Payer: Self-pay

## 2021-04-15 ENCOUNTER — Inpatient Hospital Stay (HOSPITAL_COMMUNITY)
Admission: EM | Admit: 2021-04-15 | Discharge: 2021-04-17 | DRG: 871 | Disposition: A | Discharge status: Other Acute Inpatient Hospital | Payer: Medicare Other | Attending: Pulmonary Disease | Admitting: Pulmonary Disease

## 2021-04-15 ENCOUNTER — Emergency Department (HOSPITAL_COMMUNITY): Payer: Medicare Other

## 2021-04-15 DIAGNOSIS — Z87442 Personal history of urinary calculi: Secondary | ICD-10-CM

## 2021-04-15 DIAGNOSIS — Z9049 Acquired absence of other specified parts of digestive tract: Secondary | ICD-10-CM

## 2021-04-15 DIAGNOSIS — Z66 Do not resuscitate: Secondary | ICD-10-CM | POA: Diagnosis present

## 2021-04-15 DIAGNOSIS — M797 Fibromyalgia: Secondary | ICD-10-CM | POA: Diagnosis present

## 2021-04-15 DIAGNOSIS — A419 Sepsis, unspecified organism: Principal | ICD-10-CM | POA: Diagnosis present

## 2021-04-15 DIAGNOSIS — E43 Unspecified severe protein-calorie malnutrition: Secondary | ICD-10-CM | POA: Diagnosis present

## 2021-04-15 DIAGNOSIS — J9811 Atelectasis: Secondary | ICD-10-CM | POA: Diagnosis present

## 2021-04-15 DIAGNOSIS — K651 Peritoneal abscess: Secondary | ICD-10-CM | POA: Diagnosis present

## 2021-04-15 DIAGNOSIS — L8990 Pressure ulcer of unspecified site, unspecified stage: Secondary | ICD-10-CM | POA: Insufficient documentation

## 2021-04-15 DIAGNOSIS — L89151 Pressure ulcer of sacral region, stage 1: Secondary | ICD-10-CM | POA: Diagnosis not present

## 2021-04-15 DIAGNOSIS — J90 Pleural effusion, not elsewhere classified: Secondary | ICD-10-CM | POA: Diagnosis present

## 2021-04-15 DIAGNOSIS — R112 Nausea with vomiting, unspecified: Secondary | ICD-10-CM | POA: Diagnosis not present

## 2021-04-15 DIAGNOSIS — L0291 Cutaneous abscess, unspecified: Secondary | ICD-10-CM

## 2021-04-15 DIAGNOSIS — Z9081 Acquired absence of spleen: Secondary | ICD-10-CM

## 2021-04-15 DIAGNOSIS — Z85038 Personal history of other malignant neoplasm of large intestine: Secondary | ICD-10-CM

## 2021-04-15 DIAGNOSIS — Z7982 Long term (current) use of aspirin: Secondary | ICD-10-CM

## 2021-04-15 DIAGNOSIS — Z6829 Body mass index (BMI) 29.0-29.9, adult: Secondary | ICD-10-CM

## 2021-04-15 DIAGNOSIS — D649 Anemia, unspecified: Secondary | ICD-10-CM

## 2021-04-15 DIAGNOSIS — Z79899 Other long term (current) drug therapy: Secondary | ICD-10-CM

## 2021-04-15 DIAGNOSIS — L03311 Cellulitis of abdominal wall: Secondary | ICD-10-CM | POA: Diagnosis present

## 2021-04-15 DIAGNOSIS — E785 Hyperlipidemia, unspecified: Secondary | ICD-10-CM | POA: Diagnosis present

## 2021-04-15 DIAGNOSIS — E872 Acidosis: Secondary | ICD-10-CM | POA: Diagnosis present

## 2021-04-15 DIAGNOSIS — Z933 Colostomy status: Secondary | ICD-10-CM

## 2021-04-15 DIAGNOSIS — Z8249 Family history of ischemic heart disease and other diseases of the circulatory system: Secondary | ICD-10-CM

## 2021-04-15 DIAGNOSIS — Z20822 Contact with and (suspected) exposure to covid-19: Secondary | ICD-10-CM | POA: Diagnosis present

## 2021-04-15 DIAGNOSIS — Z28310 Unvaccinated for covid-19: Secondary | ICD-10-CM

## 2021-04-15 DIAGNOSIS — Z87828 Personal history of other (healed) physical injury and trauma: Secondary | ICD-10-CM

## 2021-04-15 DIAGNOSIS — L02211 Cutaneous abscess of abdominal wall: Secondary | ICD-10-CM | POA: Diagnosis present

## 2021-04-15 DIAGNOSIS — K632 Fistula of intestine: Secondary | ICD-10-CM | POA: Diagnosis present

## 2021-04-15 DIAGNOSIS — K219 Gastro-esophageal reflux disease without esophagitis: Secondary | ICD-10-CM | POA: Diagnosis present

## 2021-04-15 DIAGNOSIS — K650 Generalized (acute) peritonitis: Secondary | ICD-10-CM | POA: Diagnosis present

## 2021-04-15 DIAGNOSIS — J9601 Acute respiratory failure with hypoxia: Secondary | ICD-10-CM | POA: Diagnosis present

## 2021-04-15 DIAGNOSIS — D539 Nutritional anemia, unspecified: Secondary | ICD-10-CM | POA: Diagnosis present

## 2021-04-15 DIAGNOSIS — I10 Essential (primary) hypertension: Secondary | ICD-10-CM | POA: Diagnosis present

## 2021-04-15 DIAGNOSIS — N400 Enlarged prostate without lower urinary tract symptoms: Secondary | ICD-10-CM | POA: Diagnosis present

## 2021-04-15 LAB — PROTIME-INR
INR: 1.3 — ABNORMAL HIGH (ref 0.8–1.2)
Prothrombin Time: 16.3 seconds — ABNORMAL HIGH (ref 11.4–15.2)

## 2021-04-15 LAB — APTT: aPTT: 36 seconds (ref 24–36)

## 2021-04-15 MED ORDER — SODIUM CHLORIDE 0.9 % IV SOLN
2.0000 g | Freq: Once | INTRAVENOUS | Status: AC
  Administered 2021-04-15: 2 g via INTRAVENOUS
  Filled 2021-04-15: qty 2

## 2021-04-15 MED ORDER — LACTATED RINGERS IV BOLUS (SEPSIS)
1000.0000 mL | Freq: Once | INTRAVENOUS | Status: AC
  Administered 2021-04-16: 1000 mL via INTRAVENOUS

## 2021-04-15 MED ORDER — VANCOMYCIN HCL IN DEXTROSE 1-5 GM/200ML-% IV SOLN
1000.0000 mg | Freq: Once | INTRAVENOUS | Status: DC
  Filled 2021-04-15: qty 200

## 2021-04-15 MED ORDER — SODIUM CHLORIDE 0.9 % IV BOLUS
1000.0000 mL | Freq: Once | INTRAVENOUS | Status: AC
  Administered 2021-04-15: 1000 mL via INTRAVENOUS

## 2021-04-15 MED ORDER — LACTATED RINGERS IV SOLN: INTRAVENOUS | Status: DC

## 2021-04-15 MED ORDER — LACTATED RINGERS IV BOLUS (SEPSIS)
1000.0000 mL | Freq: Once | INTRAVENOUS | Status: AC
  Administered 2021-04-15: 1000 mL via INTRAVENOUS

## 2021-04-15 MED ORDER — METRONIDAZOLE 500 MG/100ML IV SOLN
500.0000 mg | Freq: Once | INTRAVENOUS | Status: AC
  Administered 2021-04-15: 500 mg via INTRAVENOUS
  Filled 2021-04-15: qty 100

## 2021-04-15 MED ORDER — VANCOMYCIN HCL 2000 MG/400ML IV SOLN
2000.0000 mg | Freq: Once | INTRAVENOUS | Status: AC
  Administered 2021-04-16: 2000 mg via INTRAVENOUS
  Filled 2021-04-15: qty 400

## 2021-04-15 NOTE — ED Notes (Signed)
Pt oxygen titrated down from EMS placement. Pt now off oxygen, SPO2 97%

## 2021-04-15 NOTE — ED Triage Notes (Addendum)
Pt arrived via [facility] from home for weakness, fever, nausea, vomiting, and shortness of breath. Pt reports symptoms began on Monday when he returned [facility] on Monday after [facility] surgery. Room air sat 84% upon EMS arrival, placed on 4 LNC, rose to 99%. Prior to EMS arrival pt's wife administered lorazepam for nausea and tylenol for fever. EMS obtained 18g LFA and administered 1L NS Bolus.   EMS vitals  HR 115 BP 100/61 SPO2 84% RA -> 99% 4LNC  Temp 103.1 CBG 157

## 2021-04-16 ENCOUNTER — Encounter (HOSPITAL_COMMUNITY): Payer: Self-pay | Admitting: Emergency Medicine

## 2021-04-16 ENCOUNTER — Emergency Department (HOSPITAL_COMMUNITY): Payer: Medicare Other

## 2021-04-16 DIAGNOSIS — J90 Pleural effusion, not elsewhere classified: Secondary | ICD-10-CM | POA: Diagnosis present

## 2021-04-16 DIAGNOSIS — D539 Nutritional anemia, unspecified: Secondary | ICD-10-CM | POA: Diagnosis present

## 2021-04-16 DIAGNOSIS — A419 Sepsis, unspecified organism: Secondary | ICD-10-CM | POA: Diagnosis present

## 2021-04-16 DIAGNOSIS — Z9081 Acquired absence of spleen: Secondary | ICD-10-CM | POA: Diagnosis not present

## 2021-04-16 DIAGNOSIS — D649 Anemia, unspecified: Secondary | ICD-10-CM | POA: Insufficient documentation

## 2021-04-16 DIAGNOSIS — J9811 Atelectasis: Secondary | ICD-10-CM | POA: Diagnosis present

## 2021-04-16 DIAGNOSIS — Z20822 Contact with and (suspected) exposure to covid-19: Secondary | ICD-10-CM | POA: Diagnosis present

## 2021-04-16 DIAGNOSIS — Z933 Colostomy status: Secondary | ICD-10-CM | POA: Diagnosis not present

## 2021-04-16 DIAGNOSIS — K651 Peritoneal abscess: Secondary | ICD-10-CM | POA: Diagnosis present

## 2021-04-16 DIAGNOSIS — L89151 Pressure ulcer of sacral region, stage 1: Secondary | ICD-10-CM | POA: Diagnosis not present

## 2021-04-16 DIAGNOSIS — R112 Nausea with vomiting, unspecified: Secondary | ICD-10-CM | POA: Diagnosis present

## 2021-04-16 DIAGNOSIS — Z9049 Acquired absence of other specified parts of digestive tract: Secondary | ICD-10-CM | POA: Diagnosis not present

## 2021-04-16 DIAGNOSIS — Z66 Do not resuscitate: Secondary | ICD-10-CM | POA: Diagnosis present

## 2021-04-16 DIAGNOSIS — J9601 Acute respiratory failure with hypoxia: Secondary | ICD-10-CM | POA: Insufficient documentation

## 2021-04-16 DIAGNOSIS — L03311 Cellulitis of abdominal wall: Secondary | ICD-10-CM | POA: Diagnosis present

## 2021-04-16 DIAGNOSIS — E785 Hyperlipidemia, unspecified: Secondary | ICD-10-CM | POA: Diagnosis present

## 2021-04-16 DIAGNOSIS — M797 Fibromyalgia: Secondary | ICD-10-CM | POA: Diagnosis present

## 2021-04-16 DIAGNOSIS — K219 Gastro-esophageal reflux disease without esophagitis: Secondary | ICD-10-CM | POA: Diagnosis present

## 2021-04-16 DIAGNOSIS — Z6829 Body mass index (BMI) 29.0-29.9, adult: Secondary | ICD-10-CM | POA: Diagnosis not present

## 2021-04-16 DIAGNOSIS — L02211 Cutaneous abscess of abdominal wall: Secondary | ICD-10-CM | POA: Diagnosis not present

## 2021-04-16 DIAGNOSIS — L8990 Pressure ulcer of unspecified site, unspecified stage: Secondary | ICD-10-CM | POA: Insufficient documentation

## 2021-04-16 DIAGNOSIS — K650 Generalized (acute) peritonitis: Secondary | ICD-10-CM | POA: Diagnosis present

## 2021-04-16 DIAGNOSIS — K632 Fistula of intestine: Secondary | ICD-10-CM | POA: Diagnosis present

## 2021-04-16 DIAGNOSIS — E43 Unspecified severe protein-calorie malnutrition: Secondary | ICD-10-CM | POA: Diagnosis present

## 2021-04-16 DIAGNOSIS — Z28310 Unvaccinated for covid-19: Secondary | ICD-10-CM | POA: Diagnosis not present

## 2021-04-16 DIAGNOSIS — Z85038 Personal history of other malignant neoplasm of large intestine: Secondary | ICD-10-CM | POA: Diagnosis not present

## 2021-04-16 DIAGNOSIS — I10 Essential (primary) hypertension: Secondary | ICD-10-CM | POA: Diagnosis present

## 2021-04-16 DIAGNOSIS — E872 Acidosis: Secondary | ICD-10-CM | POA: Diagnosis present

## 2021-04-16 LAB — CBC WITH DIFFERENTIAL/PLATELET
Abs Immature Granulocytes: 2.43 10*3/uL — ABNORMAL HIGH (ref 0.00–0.07)
Basophils Absolute: 0.3 10*3/uL — ABNORMAL HIGH (ref 0.0–0.1)
Basophils Relative: 1 %
Eosinophils Absolute: 0 10*3/uL (ref 0.0–0.5)
Eosinophils Relative: 0 %
HCT: 26.2 % — ABNORMAL LOW (ref 39.0–52.0)
Hemoglobin: 8 g/dL — ABNORMAL LOW (ref 13.0–17.0)
Immature Granulocytes: 5 %
Lymphocytes Relative: 6 %
Lymphs Abs: 3 10*3/uL (ref 0.7–4.0)
MCH: 24.8 pg — ABNORMAL LOW (ref 26.0–34.0)
MCHC: 30.5 g/dL (ref 30.0–36.0)
MCV: 81.1 fL (ref 80.0–100.0)
Monocytes Absolute: 4.6 10*3/uL — ABNORMAL HIGH (ref 0.1–1.0)
Monocytes Relative: 9 %
Neutro Abs: 38.6 10*3/uL — ABNORMAL HIGH (ref 1.7–7.7)
Neutrophils Relative %: 79 %
Platelets: 454 10*3/uL — ABNORMAL HIGH (ref 150–400)
RBC: 3.23 MIL/uL — ABNORMAL LOW (ref 4.22–5.81)
RDW: 20.2 % — ABNORMAL HIGH (ref 11.5–15.5)
WBC: 48.7 10*3/uL — ABNORMAL HIGH (ref 4.0–10.5)
nRBC: 1.2 % — ABNORMAL HIGH (ref 0.0–0.2)

## 2021-04-16 LAB — COMPREHENSIVE METABOLIC PANEL
ALT: 41 U/L (ref 0–44)
AST: 41 U/L (ref 15–41)
Albumin: 1.7 g/dL — ABNORMAL LOW (ref 3.5–5.0)
Alkaline Phosphatase: 116 U/L (ref 38–126)
Anion gap: 10 (ref 5–15)
BUN: 19 mg/dL (ref 8–23)
CO2: 20 mmol/L — ABNORMAL LOW (ref 22–32)
Calcium: 8.5 mg/dL — ABNORMAL LOW (ref 8.9–10.3)
Chloride: 105 mmol/L (ref 98–111)
Creatinine, Ser: 0.85 mg/dL (ref 0.61–1.24)
GFR, Estimated: >60 mL/min (ref >60)
Glucose, Bld: 100 mg/dL — ABNORMAL HIGH (ref 70–99)
Potassium: 3.6 mmol/L (ref 3.5–5.1)
Sodium: 135 mmol/L (ref 135–145)
Total Bilirubin: 0.6 mg/dL (ref 0.3–1.2)
Total Protein: 5.1 g/dL — ABNORMAL LOW (ref 6.5–8.1)

## 2021-04-16 LAB — CBC
HCT: 26.5 % — ABNORMAL LOW (ref 39.0–52.0)
Hemoglobin: 8 g/dL — ABNORMAL LOW (ref 13.0–17.0)
MCH: 24.3 pg — ABNORMAL LOW (ref 26.0–34.0)
MCHC: 30.2 g/dL (ref 30.0–36.0)
MCV: 80.5 fL (ref 80.0–100.0)
Platelets: 601 10*3/uL — ABNORMAL HIGH (ref 150–400)
RBC: 3.29 MIL/uL — ABNORMAL LOW (ref 4.22–5.81)
RDW: 20 % — ABNORMAL HIGH (ref 11.5–15.5)
WBC: 53.4 10*3/uL (ref 4.0–10.5)
nRBC: 1 % — ABNORMAL HIGH (ref 0.0–0.2)

## 2021-04-16 LAB — LACTIC ACID, PLASMA
Lactic Acid, Venous: 2.2 mmol/L (ref 0.5–1.9)
Lactic Acid, Venous: 4.7 mmol/L (ref 0.5–1.9)
Lactic Acid, Venous: 1.7 mmol/L (ref 0.5–1.9)

## 2021-04-16 LAB — URINALYSIS, ROUTINE W REFLEX MICROSCOPIC
Bilirubin Urine: NEGATIVE
Glucose, UA: NEGATIVE mg/dL
Hgb urine dipstick: NEGATIVE
Ketones, ur: NEGATIVE mg/dL
Leukocytes,Ua: NEGATIVE
Nitrite: NEGATIVE
Protein, ur: NEGATIVE mg/dL
Specific Gravity, Urine: >1.046 — ABNORMAL HIGH (ref 1.005–1.030)
pH: 6 (ref 5.0–8.0)

## 2021-04-16 LAB — PHOSPHORUS: Phosphorus: 3.7 mg/dL (ref 2.5–4.6)

## 2021-04-16 LAB — GLUCOSE, CAPILLARY
Glucose-Capillary: 102 mg/dL — ABNORMAL HIGH (ref 70–99)
Glucose-Capillary: 103 mg/dL — ABNORMAL HIGH (ref 70–99)
Glucose-Capillary: 124 mg/dL — ABNORMAL HIGH (ref 70–99)
Glucose-Capillary: 125 mg/dL — ABNORMAL HIGH (ref 70–99)
Glucose-Capillary: 94 mg/dL (ref 70–99)

## 2021-04-16 LAB — BASIC METABOLIC PANEL
Anion gap: 10 (ref 5–15)
BUN: 15 mg/dL (ref 8–23)
CO2: 20 mmol/L — ABNORMAL LOW (ref 22–32)
Calcium: 8.7 mg/dL — ABNORMAL LOW (ref 8.9–10.3)
Chloride: 104 mmol/L (ref 98–111)
Creatinine, Ser: 0.83 mg/dL (ref 0.61–1.24)
GFR, Estimated: >60 mL/min (ref >60)
Glucose, Bld: 106 mg/dL — ABNORMAL HIGH (ref 70–99)
Potassium: 4 mmol/L (ref 3.5–5.1)
Sodium: 134 mmol/L — ABNORMAL LOW (ref 135–145)

## 2021-04-16 LAB — RESP PANEL BY RT-PCR (FLU A&B, COVID) ARPGX2
Influenza A by PCR: NEGATIVE
Influenza B by PCR: NEGATIVE
SARS Coronavirus 2 by RT PCR: NEGATIVE

## 2021-04-16 LAB — MRSA NEXT GEN BY PCR, NASAL: MRSA by PCR Next Gen: NOT DETECTED

## 2021-04-16 LAB — MAGNESIUM: Magnesium: 1.5 mg/dL — ABNORMAL LOW (ref 1.7–2.4)

## 2021-04-16 MED ORDER — SODIUM CHLORIDE 0.9 % IV SOLN
2.0000 g | Freq: Three times a day (TID) | INTRAVENOUS | Status: DC
  Administered 2021-04-16 – 2021-04-17 (×5): 2 g via INTRAVENOUS
  Filled 2021-04-16 (×5): qty 2

## 2021-04-16 MED ORDER — IOHEXOL 300 MG/ML  SOLN
100.0000 mL | Freq: Once | INTRAMUSCULAR | Status: AC | PRN
  Administered 2021-04-16: 100 mL via INTRAVENOUS

## 2021-04-16 MED ORDER — DOCUSATE SODIUM 100 MG PO CAPS: 100.0000 mg | ORAL_CAPSULE | Freq: Two times a day (BID) | ORAL | Status: DC | PRN

## 2021-04-16 MED ORDER — METRONIDAZOLE 500 MG/100ML IV SOLN
500.0000 mg | Freq: Three times a day (TID) | INTRAVENOUS | Status: DC
  Administered 2021-04-16: 500 mg via INTRAVENOUS
  Filled 2021-04-16: qty 100

## 2021-04-16 MED ORDER — TRAVASOL 10 % IV SOLN
INTRAVENOUS | Status: AC
  Filled 2021-04-16: qty 1486.8

## 2021-04-16 MED ORDER — SODIUM CHLORIDE 0.9 % IV SOLN
100.0000 mg | INTRAVENOUS | Status: DC
  Administered 2021-04-17: 100 mg via INTRAVENOUS
  Filled 2021-04-16: qty 100

## 2021-04-16 MED ORDER — SODIUM CHLORIDE 0.9% FLUSH: 10.0000 mL | INTRAVENOUS | Status: DC | PRN

## 2021-04-16 MED ORDER — SODIUM CHLORIDE 0.9 % IV SOLN
INTRAVENOUS | Status: DC | PRN
  Administered 2021-04-16: 500 mL via INTRAVENOUS

## 2021-04-16 MED ORDER — LACTATED RINGERS IV SOLN: INTRAVENOUS | Status: DC

## 2021-04-16 MED ORDER — HEPARIN SODIUM (PORCINE) 5000 UNIT/ML IJ SOLN
5000.0000 [IU] | Freq: Three times a day (TID) | INTRAMUSCULAR | Status: DC
  Administered 2021-04-16 – 2021-04-17 (×5): 5000 [IU] via SUBCUTANEOUS
  Filled 2021-04-16 (×5): qty 1

## 2021-04-16 MED ORDER — WHITE PETROLATUM EX OINT
TOPICAL_OINTMENT | CUTANEOUS | Status: AC
  Filled 2021-04-16: qty 28.35

## 2021-04-16 MED ORDER — MAGNESIUM SULFATE 2 GM/50ML IV SOLN
2.0000 g | Freq: Once | INTRAVENOUS | Status: AC
  Administered 2021-04-16: 2 g via INTRAVENOUS
  Filled 2021-04-16: qty 50

## 2021-04-16 MED ORDER — POLYETHYLENE GLYCOL 3350 17 G PO PACK: 17.0000 g | PACK | Freq: Every day | ORAL | Status: DC | PRN

## 2021-04-16 MED ORDER — DICLOFENAC SODIUM 1 % EX GEL
4.0000 g | Freq: Four times a day (QID) | CUTANEOUS | Status: DC
  Filled 2021-04-16: qty 100

## 2021-04-16 MED ORDER — ONDANSETRON HCL 4 MG/2ML IJ SOLN
4.0000 mg | Freq: Four times a day (QID) | INTRAMUSCULAR | Status: DC | PRN
  Administered 2021-04-17 (×4): 4 mg via INTRAVENOUS
  Filled 2021-04-16 (×4): qty 2

## 2021-04-16 MED ORDER — PANTOPRAZOLE SODIUM 40 MG IV SOLR
40.0000 mg | Freq: Every day | INTRAVENOUS | Status: DC
  Administered 2021-04-16: 40 mg via INTRAVENOUS
  Filled 2021-04-16: qty 40

## 2021-04-16 MED ORDER — VANCOMYCIN HCL 1250 MG/250ML IV SOLN
1250.0000 mg | Freq: Two times a day (BID) | INTRAVENOUS | Status: DC
  Administered 2021-04-16 – 2021-04-17 (×2): 1250 mg via INTRAVENOUS
  Filled 2021-04-16 (×4): qty 250

## 2021-04-16 MED ORDER — LACTATED RINGERS IV BOLUS
1000.0000 mL | Freq: Once | INTRAVENOUS | Status: AC
  Administered 2021-04-16: 1000 mL via INTRAVENOUS

## 2021-04-16 MED ORDER — ALTEPLASE 2 MG IJ SOLR
2.0000 mg | Freq: Once | INTRAMUSCULAR | Status: DC
  Filled 2021-04-16: qty 2

## 2021-04-16 MED ORDER — ORAL CARE MOUTH RINSE
15.0000 mL | Freq: Two times a day (BID) | OROMUCOSAL | Status: DC
  Administered 2021-04-16: 15 mL via OROMUCOSAL

## 2021-04-16 MED ORDER — SODIUM CHLORIDE 0.9% FLUSH
10.0000 mL | Freq: Two times a day (BID) | INTRAVENOUS | Status: DC
  Administered 2021-04-16 (×3): 10 mL
  Administered 2021-04-17: 20 mL

## 2021-04-16 MED ORDER — MAGNESIUM SULFATE 4 GM/100ML IV SOLN
4.0000 g | Freq: Once | INTRAVENOUS | Status: AC
  Administered 2021-04-16: 4 g via INTRAVENOUS
  Filled 2021-04-16: qty 100

## 2021-04-16 MED ORDER — METRONIDAZOLE 500 MG/100ML IV SOLN
500.0000 mg | Freq: Two times a day (BID) | INTRAVENOUS | Status: DC
  Administered 2021-04-16 – 2021-04-17 (×3): 500 mg via INTRAVENOUS
  Filled 2021-04-16 (×3): qty 100

## 2021-04-16 MED ORDER — CHLORHEXIDINE GLUCONATE CLOTH 2 % EX PADS
6.0000 | MEDICATED_PAD | Freq: Every day | CUTANEOUS | Status: DC
  Administered 2021-04-16: 6 via TOPICAL

## 2021-04-16 MED ORDER — SODIUM CHLORIDE 0.9 % IV SOLN
200.0000 mg | Freq: Once | INTRAVENOUS | Status: AC
  Administered 2021-04-16: 200 mg via INTRAVENOUS
  Filled 2021-04-16: qty 200

## 2021-04-16 NOTE — ED Notes (Signed)
Pt stomach is taut. When pt coughs yellow fluid projects from his belly button. Pt O2 95% but RR are 33. Placed pt on 2L of O2.

## 2021-04-16 NOTE — Progress Notes (Addendum)
PHARMACY - TOTAL PARENTERAL NUTRITION CONSULT NOTE   Indication: Fistula  Patient Measurements: Height: '5\' 11"'$  (180.3 cm) Weight: 89.1 kg (196 lb 6.9 oz) IBW/kg (Calculated) : 75.3 TPN AdjBW (KG): 80.9 Body mass index is 27.4 kg/m. Usual Weight: 210 lbs   Assessment: 75 yo male admitted on 7/22 with nausea and vomiting. 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 Patient was recently discharged from [facility] on 7/18. 40 lbs weight los reported since April per Wife and patient is on 16 hr cyclic TPN prior to admission managed by [facility] in [facility]. Last TPN was 7/22 started at 1800 and stopped ~2200 when EMS arrived.   Home TPN per label (awaiting call back and fax per [facility]): 2059m with 300g dextrose, 120g AA, and 55g lipids infused over 16 hrs  - 20 mEq/L Na chloride, 8 mEq Kcl, 4.5 mEq/L Ca gluconate, 35 mEq/L Na acetate, 17.5 mEq/L K phos, 7.5 mEq/L Mg sulfate   Glucose / Insulin: no hx DM. CBGs 100s.  - A1c 5.3 in January 2022 Electrolytes: Na 134. K 4.0, CoCa 10.54, Mg 1.5, Phos 3.7. Other electrolytes wnl.  Renal: Scr 0.83 - at baseline. BUN 15.  Hepatic: LFTs wnl. Tbili wnl. Albumin 1.7. TG 147 (7/18). No prealbumin.  Intake / Output; MIVF: LR '@200'$  ml/hr. 5[place] so far today.  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 This will provide 149g AA and 2439 kcal meeting 100% of needs  Electrolytes in TPN: Na 746m/L, K 5050mL, Ca 3mE84m, Mg 8mEq80m and Phos 15mmo57m Cl:Ac 1:1 Mg 2g x1  Continue MIVF per MD Add standard MVI and trace elements to TPN Check CBGs q4h with start of cyclic TPN Monitor TPN labs on Mon/Thurs, repeat electrolytes tomorrow Follow up transfer plans  Follow up blood culture  Herberta Pickron Cristela FeltmD, BCPS Clinical Pharmacist  04/16/2021,10:19 AM

## 2021-04-16 NOTE — ED Notes (Signed)
Pt stated his incision feels like it is pulsating and it hurts. He normally uses Voltran cream as well. Notified provider.

## 2021-04-16 NOTE — ED Notes (Signed)
Pt belly button is oozing out a yellow foul substance. It is consistent with his drainage bag. Provider came to the bedside.

## 2021-04-16 NOTE — Progress Notes (Signed)
Pharmacy Antibiotic Note  Curtis Price. is a 75 y.o. male admitted on 04/15/2021 with sepsis.  Pharmacy has been consulted for vancomycin and cefepime dosing.  Plan: Cefepime 2gm IV q8 hours Vancomycin 2gm IV x 1 then 1250 mg IV q12 hours F/u renal function, cultures and clinical course  Height: '5\' 11"'$  (180.3 cm) Weight: 97.5 kg (214 lb 15.2 oz) IBW/kg (Calculated) : 75.3  Temp (24hrs), Avg:99.8 F (37.7 C), Min:99.8 F (37.7 C), Max:99.8 F (37.7 C)  Recent Labs  Lab 04/10/21 0607 04/15/21 2252  WBC  --  48.7*  CREATININE 0.76  --     Estimated Creatinine Clearance: 96.5 mL/min (by C-G formula based on SCr of 0.76 mg/dL).    Allergies  Allergen Reactions   Atenolol    Atorvastatin    Darvocet [Propoxyphene N-Acetaminophen]    Paroxetine    Pregabalin    Propoxyphene Hcl     Thank you for allowing pharmacy to be a part of this patient's care.  Excell Seltzer Poteet 04/16/2021 12:16 AM

## 2021-04-16 NOTE — Progress Notes (Signed)
Patient's RN put on the consult for occlusion of PICC line on red port. It was okay to flush and blood return was good. Informed Patient's RN and she flushed & blood was returning. No TPA at this time. HS [employer]

## 2021-04-16 NOTE — ED Notes (Signed)
Pt requested to be put on O2. Informed him his O2 is 95%. Raised the [facility] for pt.

## 2021-04-16 NOTE — H&P (Signed)
NAME:  Curtis Price, Curtis Price MRN:  [id number], DOB:  25-Jul-1946, LOS: 0 ADMISSION DATE:  04/15/2021, CONSULTATION DATE:  7/23 REFERRING MD:  Dr. Randal Buba, CHIEF COMPLAINT:  nausea and vomiting   History of Present Illness:  Curtis Price ,is a 75 y.o. male, who presented to the [facility] ED  on 7/22 with a chief complaint of nausea and vomiting  [facility]. At home he developed nausea and vomiting and he would fever at night. This continued to get worse which resulted in presentation to the [facility] ED the evening of 7/22  On arrival he was found to be tachycardic, febrile at 103.1, and hypoxic with a O2 sat of 84 which improved with 4LNC. ED course was notable for a lactate of 2.2 which increased to 4.7, WBC of 48.7, HGB 8, albumin of 1.7. While in the ED a wound below his umbilicus began to ooze a yellow brown substance with foul smell. A CT of the abdomen was obtained which was concerning for abscess formation, cutaneous fistulization, and peritonitis. Cultures were ordered, 28m/kg fluid was administered, and he was started on Vanc/Cefepime/Flagyl. Surgery was consulted by the ED. [facility] however they did not have room for admission.  PCCM was asked to admit the patient by the ED.  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  His history was obtained from his wife SQuashawn Fikesdue to the patient being in pain and not wanting to be interviewed. Mr. ACauldwellhas a complex surgical history which began on 12/28/20 where he [facility] which ultimately resulted in a right colectomy, splenectomy, omentectomy and cholecystectomy. His hospital course was complicated by anastomic leaks between gastrointestinal structures and  abscesses at drain sites. He has lost a reported 40 lbs since April. [facility] and was discharged to home on 7/18 on TPN.   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  Significant Hospital Events: Including procedures, antibiotic start and stop dates in addition to other pertinent events   [facility]  Interim History / Subjective:  See above  Endorses SOB, N & V. Denies chest pain  Objective   Blood pressure (!) 105/55, pulse (!) 139, temperature 99.8 F (37.7 C), temperature source Oral, resp. rate (!) 26, height '5\' 11"'$  (1.803 m), weight 97.5 kg, SpO2 97 %. On 4LNC        Intake/Output Summary (Last 24 hours) at 04/16/2021 0352 Last data filed at 04/16/2021 0050 Gross per 24 hour  Intake 3200 ml  Output --  Net 3200 ml   Filed Weights   04/15/21 2339  Weight: 97.5 kg    Examination: General:  ill appearing, in bed, NAD HEENT: MM dry, anicteric, atraumatic Neuro: MAE, RASS 0, follows commands, PERRL 374mCV: S1S2, ST, no m/r/g appreciated PULM:  clear in the upper lobes and in the lower lobes, Trachea midline, chest expansion symmetric GI: firm, distended, hypoactive bowel sounds, RLQ ostomy  Extremities: warm/dry, trace pretibial edema, capillary refill less than 3 seconds  Skin: erythema to RLQ drain site, yellow drainage from pink wound inferior to umbilicus.  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 3L IVF given in ED -Goal MAP 65 or greater. Start peripheral levophed and titrate to goal if unable to meet MAP goal. -Hydration with LR -Follow up BC and UC -On Cefepime, Vancomycin, and flagyl. Narrow as cultures result -Trend lactate -Daily CBC, BMP -Place foley for monitoring of UOP -Surgery consulted by the ED. Follow up recommendations  -Transfer to [facility] when beds available  Acute respiratory failure with hypoxia L Pleural Effusion- as seen on 7/23 CT, suspect secondary to malnutrition. Albumin 1.7. On 4LNC, lungs clear, initial O2 sat in ED 84 per notes -Goal Spo2 92-98%. Wean O2 to goal. -Follow up CXR on 7/23 -Monitor clinical exam closely for increasing WOB or increased O2 requirement.   Anemia, Normocytic-Suspect chronic due to malnutrition HGB 8 -Transfuse PRBC if HBG less than 7 -Obtain AM CBC to trend H&H -Monitor for signs of bleeding  Malnutrition, severe on TPN PTA Reported 40LB weight loss since April -TPN consult  Nausea and vomiting -Continue NPO -PRN zofran  HX GERD -On PPI  HX HTN -Hold home antihypertensives at this time due to sepsis.  HX mucinous adenocarcinoma of the appendix -Transfer to [facility] when able. Per ED discussed with Dr. Clovis Riley of oncology.   Best Practice (right click and "Reselect all SmartList Selections" daily)   Diet/type: NPO DVT prophylaxis: prophylactic heparin  GI prophylaxis: PPI Lines: yes and it is still needed PICC in place PTA Foley:  Yes, and it is still needed Code Status:  DNR Last date of multidisciplinary goals of care discussion [facility] per wife 7/23]  Labs   CBC: Recent Labs  Lab 04/15/21 2252  WBC 48.7*  NEUTROABS 38.6*  HGB 8.0*  HCT 26.2*  MCV 81.1  PLT 454*    Basic Metabolic Panel: Recent Labs  Lab 04/09/21 0558 04/10/21 0607 04/11/21 0805 04/15/21 2252  NA  --  132*  --  135  K  --  3.6  --  3.6  CL  --  102  --  105  CO2  --  25  --  20*  GLUCOSE  --  117*  --  100*  BUN   --  14  --  19  CREATININE  --  0.76  --  0.85  CALCIUM  --  8.8*  --  8.5*  MG  --   --  1.4*  --   PHOS 2.7  --   --   --    GFR: Estimated Creatinine Clearance: 90.8 mL/min (by C-G formula based on SCr of 0.85 mg/dL). Recent Labs  Lab 04/15/21 2252  WBC 48.7*  LATICACIDVEN 2.2*    Liver Function Tests: Recent Labs  Lab 04/15/21 2252  AST 41  ALT 41  ALKPHOS 116  BILITOT 0.6  PROT 5.1*  ALBUMIN 1.7*   No results for input(s): LIPASE, AMYLASE in the last 168 hours. No results for input(s): AMMONIA in the last 168 hours.  ABG    Component Value Date/Time   PHART 7.374 02/25/2010 0825   PCO2ART 45.1 (H) 02/25/2010 0825   PO2ART 81.0 02/25/2010 0825   HCO3 26.3 (H) 02/25/2010 0825  TCO2 28 02/25/2010 0825   O2SAT 96.0 02/25/2010 0825     Coagulation Profile: Recent Labs  Lab 04/15/21 2252  INR 1.3*    Cardiac Enzymes: No results for input(s): CKTOTAL, CKMB, CKMBINDEX, TROPONINI in the last 168 hours.  HbA1C: Hgb A1c MFr Bld  Date/Time Value Ref Range Status  10/08/2020 10:21 AM 5.3 <5.7 % of total Hgb Final    Comment:    For the purpose of screening for the presence of diabetes: . <5.7%       Consistent with the absence of diabetes 5.7-6.4%    Consistent with increased risk for diabetes             (prediabetes) > or =6.5%  Consistent with diabetes . This assay result is consistent with a decreased risk of diabetes. . Currently, no consensus exists regarding use of hemoglobin A1c for diagnosis of diabetes in children. . According to American Diabetes Association (ADA) guidelines, hemoglobin A1c <7.0% represents optimal control in non-pregnant diabetic patients. Different metrics may apply to specific patient populations.  Standards of Medical Care in Diabetes(ADA). Marland Kitchen   03/19/2020 10:11 AM 5.3 <5.7 % of total Hgb Final    Comment:    For the purpose of screening for the presence of diabetes: . <5.7%       Consistent with the absence of  diabetes 5.7-6.4%    Consistent with increased risk for diabetes             (prediabetes) > or =6.5%  Consistent with diabetes . This assay result is consistent with a decreased risk of diabetes. . Currently, no consensus exists regarding use of hemoglobin A1c for diagnosis of diabetes in children. . According to American Diabetes Association (ADA) guidelines, hemoglobin A1c <7.0% represents optimal control in non-pregnant diabetic patients. Different metrics may apply to specific patient populations.  Standards of Medical Care in Diabetes(ADA). .     CBG: No results for input(s): GLUCAP in the last 168 hours.  Review of Systems:   Positives in bold  Gen: fever, chills, weight change, fatigue, night sweats HEENT:  blurred vision, double vision, hearing loss, tinnitus, sinus congestion, rhinorrhea, sore throat, neck stiffness, dysphagia PULM:  shortness of breath, cough, sputum production, hemoptysis, wheezing CV: chest pain, edema, orthopnea, paroxysmal nocturnal dyspnea, palpitations GI:  abdominal pain, nausea, vomiting, diarrhea, hematochezia, melena, constipation, change in bowel habits GU: dysuria, hematuria, polyuria, oliguria, urethral discharge Endocrine: hot or cold intolerance, polyuria, polyphagia or appetite change Derm: rash, dry skin, scaling or peeling skin change Heme: easy bruising, bleeding, bleeding gums Neuro: headache, numbness, weakness, slurred speech, loss of memory or consciousness   Past Medical History:  He,  has a past medical history of Allergy, BPH (benign prostatic hypertrophy), 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   Surgical History:   Past Surgical History:  Procedure Laterality Date   BIH REPAIR  08/2005   Incarverated fat by mesh repair   CARDIAC CATHETERIZATION  02/2010   Left and right   COLONOSCOPY  06/09/2015   [facility] SURGERY  2000   Repair of injury to right fifth finger   [place]   Bilateral   TONSILLECTOMY  1962  Social History:   reports that he has never smoked. He has never used smokeless tobacco. He reports current alcohol use of about 12.0 standard drinks of alcohol per week. He reports that he does not use drugs.   Family History:  His family history includes Aortic stenosis in an other family member; Aortic stenosis (age of onset: 46) in his father; Breast cancer in his mother and another family member; Diabetes in an other family member; Hypertension in his mother and another family member; Other in an other family member. There is no history of Stroke, Colon cancer, Colon polyps, Esophageal cancer, Rectal cancer, or Stomach cancer.   Allergies Allergies  Allergen Reactions   Atenolol    Atorvastatin    Darvocet [Propoxyphene N-Acetaminophen]    Paroxetine    Pregabalin    Propoxyphene Hcl      Home Medications  Prior to Admission medications   Medication Sig Start Date End Date Taking? Authorizing Provider  aspirin 81 MG EC tablet Take 81 mg by mouth daily.    [provider]  B Complex-C (SUPER B COMPLEX PO) Take 1 tablet by mouth 2 (two) times daily.     [provider]  buPROPion (WELLBUTRIN XL) 300 MG 24 hr tablet Take  1 tablet  Daily for Mood            TAKE 1 TABLET BY MOUTH DAILY FOR MOOD 11/21/20   [name], MD  cetirizine (ZYRTEC) 10 MG tablet Take 1 tablet (10 mg total) by mouth at bedtime. 11/16/16   [name], MD  famotidine (PEPCID) 40 MG tablet TAKE 1 TABLET BY MOUTH ONCE DAILY FOR  INDIGESTION AND HEARTBURN 08/02/20   McClanahan, Danton Sewer, NP  fish oil-omega-3 fatty acids 1000 MG capsule Take 1 capsule by mouth 2 (two) times daily.    [provider]  hydrochlorothiazide (HYDRODIURIL) 25 MG tablet TAKE 1 TABLET BY MOUTH ONCE DAILY FOR BLOOD PRESSURE AND FLUID RETENTION 02/05/20   [name], MD  Magnesium 250 MG TABS Take 1 tablet by mouth 2 (two) times daily.    [provider]  Multiple Vitamins-Minerals (STRESS B COMPLEX/ZINC) TABS Take 1 tablet by mouth daily.    [provider]  olmesartan (BENICAR) 40 MG tablet TAKE 1 TABLET DAILY FOR BLOOD PRESSURE 02/12/21   [name], MD  ondansetron (ZOFRAN) 4 MG tablet Take 1 tablet (4 mg total) by mouth every 8 (eight) hours as needed for nausea or vomiting. 04/12/21   Liane Comber, NP  OVER THE COUNTER MEDICATION Takes calcium, magnesium zinc vitamin 2 times a day.    [provider]  pantoprazole (PROTONIX) 40 MG tablet TAKE 1 TABLET DAILY FOR INDIGESTION & HEARTBURN 02/07/21   Liane Comber, NP  promethazine (PHENERGAN) 25 MG suppository Place 1 suppository (25 mg total) rectally every 6 (six) hours as needed for nausea or vomiting. 04/12/21   Liane Comber, NP  rosuvastatin (CRESTOR) 40 MG tablet Take 1 tablet daily for Cholesterol 02/05/20   [name], MD  vitamin C (ASCORBIC ACID) 500 MG tablet Take 500 mg by mouth daily.    [provider]  VITAMIN D PO Take 5,000 Units by mouth 2 (two) times daily.    [provider]  zinc gluconate 50 MG tablet Take 50 mg by mouth daily.    [provider]     Critical care time: 35 minutes     Redmond School., MSN, APRN, AGACNP-BC [facility]  04/16/2021 , 3:52 AM  Please see [email or web address] for pager details  If no response, please call [phone] After hours, please call Elink at [phone]

## 2021-04-16 NOTE — ED Notes (Signed)
Patient transported to CT 

## 2021-04-16 NOTE — ED Notes (Signed)
Pt stated his drainage bag burst. I was unable to find leakage. I drained the bag and wiped it down. Pt linens was changed and gown.

## 2021-04-16 NOTE — Progress Notes (Signed)
Called [facility] transfer line & confirmed that he is on the waiting list for a transfer but no bed available as of now.  1L bolus given for hypotension & eraxis added to cover for fungal sepsis Updated pt & wife DNR noted, he would be amenable to surgical procedure as needed.  Leanna Sato Elsworth Soho MD

## 2021-04-16 NOTE — ED Notes (Signed)
Notified provider pt incision is oozing yellow drainage.

## 2021-04-16 NOTE — ED Notes (Signed)
Pt emesis is light green colored.

## 2021-04-16 NOTE — ED Provider Notes (Signed)
[facility] EMERGENCY DEPARTMENT Provider Note   CSN: [id number] Arrival date & time: 04/15/21  2224     History Chief Complaint  Patient presents with   Fever   Emesis   Shortness of Breath    Curtis Price. is a 75 y.o. male.  The history is provided by the patient and the spouse.  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      Past Medical History:  Diagnosis Date   Allergy    BPH (benign prostatic hypertrophy)    Elevated serum immunoglobulin free light chains 04/28/2014   Erythrocytosis 04/28/2014   Exertional dyspnea    ETT myoview 5/11: 8 min, no ischemic ECG changes, stopped due to fatigue/ SOB. EF 60%, no ischemia or infarction. Echo 5/11 EF 60%, no significant valvular abnormalities, mild RV dilation and dysfunction, no complete TR doppler jet so PA pressure hard to gauge. 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 R heart cath 6/11: mean RA 10 mmHg, PA 25/18, mean PCWP 13 mmHg, CI 2.1   Fibromyalgia    GERD (gastroesophageal reflux disease)    History of epididymitis    History of head injury without skull fracture 1980   Concussion   Hx of adenomatous polyp of colon 06/19/2007   Hyperlipidemia    Myalgias with Lipitor   Hypertension    Hypogonadism male    Myofascial pain syndrome    Nephrolithiasis    Prolapsed internal hemorrhoids     Prostatitis    Recurrent   Rheumatic fever    x3 at ages [age] and 43; No significant valvular abnormality on 5/11 echo   Tendinitis 2000   Hx of left bicep   Vitamin D deficiency     Patient Active Problem List   Diagnosis Date Noted   Abnormality of colon-appendix on colonoscopy 09/10/2020   Polycythemia 05/29/2019   Dysthymia 05/28/2019   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   Obesity (BMI 30.0-34.9) 09/05/2015   Abnormal glucose 08/31/2015   Erectile dysfunction 08/31/2015   Elevated serum immunoglobulin free light chains 04/28/2014   Medication management 11/19/2013   Benign prostatic hyperplasia    GERD    Fibromyalgia    Testosterone Deficiency    Hyperlipidemia, mixed    Vitamin D deficiency    Essential hypertension 02/15/2010   Hx of adenomatous polyp of colon 06/19/2007    Past Surgical History:  Procedure Laterality Date   BIH REPAIR  08/2005   Incarverated fat by mesh repair   CARDIAC CATHETERIZATION  02/2010   Left and right   COLONOSCOPY  06/09/2015   [facility]   FINGER SURGERY  2000   Repair of injury to right fifth finger   HEEL SPUR SURGERY  1960   Bilateral   TONSILLECTOMY  1962       Family History  Problem Relation Age of Onset   Hypertension Mother    Breast cancer Mother  Aortic stenosis Father 31   Hypertension Other    Aortic stenosis Other    Breast cancer Other    Diabetes Other    Other Other        ASHD and epilepsy   Stroke Neg Hx    Colon cancer Neg Hx    Colon polyps Neg Hx    Esophageal cancer Neg Hx    Rectal cancer Neg Hx    Stomach cancer Neg Hx     Social History   Tobacco Use   Smoking status: Never   Smokeless tobacco: Never  Vaping Use   Vaping Use: Never used  Substance Use Topics   Alcohol use: Yes    Alcohol/week: 12.0 standard drinks    Types: 12 Cans of beer per week    Comment: beer and liquoer   Drug use: No    Home Medications Prior to Admission medications   Medication Sig Start Date End  Date Taking? Authorizing Provider  aspirin 81 MG EC tablet Take 81 mg by mouth daily.    [provider]  B Complex-C (SUPER B COMPLEX PO) Take 1 tablet by mouth 2 (two) times daily.     [provider]  buPROPion (WELLBUTRIN XL) 300 MG 24 hr tablet Take  1 tablet  Daily for Mood            TAKE 1 TABLET BY MOUTH DAILY FOR MOOD 11/21/20   [name], MD  cetirizine (ZYRTEC) 10 MG tablet Take 1 tablet (10 mg total) by mouth at bedtime. 11/16/16   [name], MD  famotidine (PEPCID) 40 MG tablet TAKE 1 TABLET BY MOUTH ONCE DAILY FOR INDIGESTION AND HEARTBURN 08/02/20   McClanahan, Danton Sewer, NP  fish oil-omega-3 fatty acids 1000 MG capsule Take 1 capsule by mouth 2 (two) times daily.    [provider]  hydrochlorothiazide (HYDRODIURIL) 25 MG tablet TAKE 1 TABLET BY MOUTH ONCE DAILY FOR BLOOD PRESSURE AND FLUID RETENTION 02/05/20   [name], MD  Magnesium 250 MG TABS Take 1 tablet by mouth 2 (two) times daily.    [provider]  Multiple Vitamins-Minerals (STRESS B COMPLEX/ZINC) TABS Take 1 tablet by mouth daily.    [provider]  olmesartan (BENICAR) 40 MG tablet TAKE 1 TABLET DAILY FOR BLOOD PRESSURE 02/12/21   [name], MD  ondansetron (ZOFRAN) 4 MG tablet Take 1 tablet (4 mg total) by mouth every 8 (eight) hours as needed for nausea or vomiting. 04/12/21   Liane Comber, NP  OVER THE COUNTER MEDICATION Takes calcium, magnesium zinc vitamin 2 times a day.    [provider]  pantoprazole (PROTONIX) 40 MG tablet TAKE 1 TABLET DAILY FOR INDIGESTION & HEARTBURN 02/07/21   Liane Comber, NP  promethazine (PHENERGAN) 25 MG suppository Place 1 suppository (25 mg total) rectally every 6 (six) hours as needed for nausea or vomiting. 04/12/21   Liane Comber, NP  rosuvastatin (CRESTOR) 40 MG tablet Take 1 tablet daily for Cholesterol 02/05/20   [name], MD  vitamin C (ASCORBIC ACID) 500 MG tablet Take 500 mg by mouth daily.     [provider]  VITAMIN D PO Take 5,000 Units by mouth 2 (two) times daily.    [provider]  zinc gluconate 50 MG tablet Take 50 mg by mouth daily.    [provider]    Allergies    Atenolol, Atorvastatin, Darvocet [propoxyphene n-acetaminophen], Paroxetine, Pregabalin, and Propoxyphene hcl  Review of Systems  Review of Systems  Constitutional:  Positive for fever.  HENT:  Negative for facial swelling.   Eyes:  Negative for redness.  Respiratory:  Positive for shortness of breath.   Cardiovascular:  Negative for palpitations.  Gastrointestinal:  Positive for nausea and vomiting.  Genitourinary:  Negative for difficulty urinating.  Musculoskeletal:  Negative for neck stiffness.  Skin:  Positive for wound.  Neurological:  Negative for facial asymmetry.  Psychiatric/Behavioral:  Negative for agitation.   All other systems reviewed and are negative.  Physical Exam Updated Vital Signs BP 121/62   Pulse (!) 114   Temp 99.8 F (37.7 C) (Oral)   Resp (!) 27   Ht '5\' 11"'$  (1.803 m)   Wt 97.5 kg   SpO2 96%   BMI 29.98 kg/m   Physical Exam Vitals and nursing note reviewed.  Constitutional:      Appearance: Normal appearance. He is not diaphoretic.  HENT:     Head: Normocephalic and atraumatic.     Nose: Nose normal.  Eyes:     Conjunctiva/sclera: Conjunctivae normal.     Pupils: Pupils are equal, round, and reactive to light.  Cardiovascular:     Rate and Rhythm: Regular rhythm. Tachycardia present.     Pulses: Normal pulses.     Heart sounds: Normal heart sounds.  Pulmonary:     Breath sounds: Decreased air movement present. No rales.  Abdominal:     General: Bowel sounds are normal.     Tenderness: There is no guarding or rebound.     Comments: Ostomy with pink turbid material   Musculoskeletal:        General: Normal range of motion.     Cervical back: Normal range of motion and neck supple.  Skin:    General: Skin is warm and dry.      Capillary Refill: Capillary refill takes less than 2 seconds.  Neurological:     Mental Status: He is alert.     Deep Tendon Reflexes: Reflexes normal.  Psychiatric:        Mood and Affect: Mood normal.        Behavior: Behavior normal.    ED Results / Procedures / Treatments   Labs (all labs ordered are listed, but only abnormal results are displayed) Results for orders placed or performed during the hospital encounter of 04/15/21  Resp Panel by RT-PCR (Flu A&B, Covid) Nasopharyngeal Swab   Specimen: Nasopharyngeal Swab; Nasopharyngeal(NP) swabs in vial transport medium  Result Value Ref Range   SARS Coronavirus 2 by RT PCR NEGATIVE NEGATIVE   Influenza A by PCR NEGATIVE NEGATIVE   Influenza B by PCR NEGATIVE NEGATIVE  Lactic acid, plasma  Result Value Ref Range   Lactic Acid, Venous 2.2 (HH) 0.5 - 1.9 mmol/L  Comprehensive metabolic panel  Result Value Ref Range   Sodium 135 135 - 145 mmol/L   Potassium 3.6 3.5 - 5.1 mmol/L   Chloride 105 98 - 111 mmol/L   CO2 20 (L) 22 - 32 mmol/L   Glucose, Bld 100 (H) 70 - 99 mg/dL   BUN 19 8 - 23 mg/dL   Creatinine, Ser 0.85 0.61 - 1.24 mg/dL   Calcium 8.5 (L) 8.9 - 10.3 mg/dL   Total Protein 5.1 (L) 6.5 - 8.1 g/dL   Albumin 1.7 (L) 3.5 - 5.0 g/dL   AST 41 15 - 41 U/L   ALT 41 0 - 44 U/L   Alkaline Phosphatase 116 38 - 126 U/L  Total Bilirubin 0.6 0.3 - 1.2 mg/dL   GFR, Estimated >60 >60 mL/min   Anion gap 10 5 - 15  CBC WITH DIFFERENTIAL  Result Value Ref Range   WBC 48.7 (H) 4.0 - 10.5 K/uL   RBC 3.23 (L) 4.22 - 5.81 MIL/uL   Hemoglobin 8.0 (L) 13.0 - 17.0 g/dL   HCT 26.2 (L) 39.0 - 52.0 %   MCV 81.1 80.0 - 100.0 fL   MCH 24.8 (L) 26.0 - 34.0 pg   MCHC 30.5 30.0 - 36.0 g/dL   RDW 20.2 (H) 11.5 - 15.5 %   Platelets 454 (H) 150 - 400 K/uL   nRBC 1.2 (H) 0.0 - 0.2 %   Neutrophils Relative % 79 %   Neutro Abs 38.6 (H) 1.7 - 7.7 K/uL   Lymphocytes Relative 6 %   Lymphs Abs 3.0 0.7 - 4.0 K/uL   Monocytes Relative 9 %    Monocytes Absolute 4.6 (H) 0.1 - 1.0 K/uL   Eosinophils Relative 0 %   Eosinophils Absolute 0.0 0.0 - 0.5 K/uL   Basophils Relative 1 %   Basophils Absolute 0.3 (H) 0.0 - 0.1 K/uL   Immature Granulocytes 5 %   Abs Immature Granulocytes 2.43 (H) 0.00 - 0.07 K/uL  Protime-INR  Result Value Ref Range   Prothrombin Time 16.3 (H) 11.4 - 15.2 seconds   INR 1.3 (H) 0.8 - 1.2  APTT  Result Value Ref Range   aPTT 36 24 - 36 seconds   DG Ankle Complete Left  Result Date: 03/21/2021 CLINICAL DATA:  Acute left ankle pain and swelling without known injury. EXAM: LEFT ANKLE COMPLETE - 3+ VIEW COMPARISON:  None. FINDINGS: There is no evidence of fracture, dislocation, or joint effusion. There is no evidence of arthropathy or other focal bone abnormality. Soft tissues are unremarkable. IMPRESSION: Negative. Electronically Signed   By: Marijo Conception M.D.   On: 03/21/2021 12:19   DG Abd 1 View  Result Date: 03/23/2021 CLINICAL DATA:  75 year old male with mucinous adenoma. EXAM: ABDOMEN - 1 VIEW COMPARISON:  CT abdomen pelvis dated 09/28/2020. FINDINGS: No bowel dilatation or evidence of obstruction. No free air or radiopaque calculi. Contrast noted within the urinary bladder. Degenerative changes of the spine. No acute osseous pathology. The soft tissues are grossly unremarkable. IMPRESSION: No bowel obstruction. Electronically Signed   By: Anner Crete M.D.   On: 03/23/2021 18:50   DG Chest Port 1 View  Result Date: 04/15/2021 CLINICAL DATA:  Questionable sepsis - evaluate for abnormality EXAM: PORTABLE CHEST 1 VIEW COMPARISON:  04/05/2021 FINDINGS: Right PICC tip at the cavoatrial junction. Unchanged left basilar atelectasis/consolidation and small left pleural effusion. IMPRESSION: Unchanged left basilar atelectasis/consolidation and small left pleural effusion. Electronically Signed   By: Ulyses Jarred M.D.   On: 04/15/2021 23:11   DG CHEST PORT 1 VIEW  Result Date: 04/05/2021 CLINICAL DATA:   Fever EXAM: PORTABLE CHEST 1 VIEW COMPARISON:  Portable exam 1206 hours compared to 03/29/2021 FINDINGS: RIGHT arm PICC line tip projects over SVC. Mild enlargement of cardiac silhouette. Mediastinal contours and pulmonary vascularity normal. Atelectasis versus consolidation LEFT lower lobe. Bronchitic changes with mild subsegmental atelectasis at RIGHT lung base. Upper lungs clear. Probable small LEFT pleural effusion. No pneumothorax. Bones demineralized. IMPRESSION: Atelectasis versus consolidation LEFT lower lobe with small LEFT pleural effusion. Bronchitic changes with mild RIGHT basilar atelectasis. Mild enlargement of cardiac silhouette. Electronically Signed   By: Lavonia Dana M.D.   On: 04/05/2021 12:35  DG Chest Port 1 View  Result Date: 03/29/2021 CLINICAL DATA:  Respiratory distress EXAM: PORTABLE CHEST 1 VIEW COMPARISON:  03/21/2021 FINDINGS: Right-sided PICC line remains in place. Stable cardiomegaly. Increasing hazy left basilar opacities. Suspect trace left pleural effusion. No pneumothorax. IMPRESSION: Increasing hazy left basilar opacities may represent atelectasis versus pneumonia. Electronically Signed   By: Davina Poke D.O.   On: 03/29/2021 15:24   DG Chest Port 1 View  Result Date: 03/21/2021 CLINICAL DATA:  Cough. EXAM: PORTABLE CHEST 1 VIEW COMPARISON:  March 20, 2021. FINDINGS: Stable cardiomediastinal silhouette. No pneumothorax is noted. Right-sided PICC line is unchanged in position. Right lung is clear. Mild left basilar atelectasis is noted with small left pleural effusion. Bony thorax is unremarkable. IMPRESSION: Mild left basilar subsegmental atelectasis with small left pleural effusion. Electronically Signed   By: Marijo Conception M.D.   On: 03/21/2021 12:18   DG Chest Port 1 View  Result Date: 03/20/2021 CLINICAL DATA:  Central line placement EXAM: PORTABLE CHEST 1 VIEW COMPARISON:  October 12, 2017 FINDINGS: A new right PICC line is been placed, terminating centrally  near the caval atrial junction. No pneumothorax. Probable small effusion with underlying opacity on the left. Increased interstitial markings in the lungs. The cardiomediastinal silhouette is stable. IMPRESSION: 1. The new right PICC line is in good position terminating centrally near the caval atrial junction. No pneumothorax. 2. Probable small left effusion with underlying opacity, probably atelectasis. 3. Suggested pulmonary venous congestion/mild edema. Electronically Signed   By: Dorise Bullion III M.D   On: 03/20/2021 17:56    EKG EKG Interpretation  Date/Time:  Friday April 15 2021 22:40:35 EDT Ventricular Rate:  114 PR Interval:  153 QRS Duration: 95 QT Interval:  341 QTC Calculation: 470 R Axis:   -43 Text Interpretation: Sinus tachycardia Left axis deviation Confirmed by Randal Buba, Erendida Wrenn ([id number]) on 04/15/2021 11:11:03 PM  Radiology DG Chest Port 1 View  Result Date: 04/15/2021 CLINICAL DATA:  Questionable sepsis - evaluate for abnormality EXAM: PORTABLE CHEST 1 VIEW COMPARISON:  04/05/2021 FINDINGS: Right PICC tip at the cavoatrial junction. Unchanged left basilar atelectasis/consolidation and small left pleural effusion. IMPRESSION: Unchanged left basilar atelectasis/consolidation and small left pleural effusion. Electronically Signed   By: Ulyses Jarred M.D.   On: 04/15/2021 23:11    Procedures Procedures   Medications Ordered in ED Medications  lactated ringers infusion (has no administration in time range)  metroNIDAZOLE (FLAGYL) IVPB 500 mg (500 mg Intravenous New Bag/Given 04/15/21 2325)  lactated ringers bolus 1,000 mL (1,000 mLs Intravenous New Bag/Given 04/15/21 2332)    And  lactated ringers bolus 1,000 mL (1,000 mLs Intravenous New Bag/Given 04/16/21 0009)    And  lactated ringers bolus 1,000 mL (has no administration in time range)  vancomycin (VANCOREADY) IVPB 2000 mg/400 mL (has no administration in time range)  diclofenac Sodium (VOLTAREN) 1 % topical gel 4 g (has  no administration in time range)  sodium chloride 0.9 % bolus 1,000 mL (0 mLs Intravenous Stopped 04/16/21 0005)  ceFEPIme (MAXIPIME) 2 g in sodium chloride 0.9 % 100 mL IVPB (0 g Intravenous Stopped 04/16/21 0001)    ED Course  I have reviewed the triage vital signs and the nursing notes.  Pertinent labs & imaging results that were available during my care of the patient were reviewed by me and considered in my medical decision making (see chart for details).   [place] [address]. Hal Hope declines admission   MDM Reviewed: previous chart, nursing note  and vitals Interpretation: labs, x-ray, CT scan and ECG (white count of 48.7, elevated with left shift, normal creatinine, elevated lactate, pleural effusion and intraabdominal collection by me on CT scan) Total time providing critical care: > 105 minutes (sepsis with multiple bonuses and multiple antibiotics). This excludes time spent performing separately reportable procedures and services. Consults: surgery at [facility], radiology by phone. CRITICAL CARE Performed by: Rahm Minix K Tiffani Kadow-Rasch Total critical care time: 120 minutes Critical care time was exclusive of separately billable procedures and treating other patients. Critical care was necessary to treat or prevent imminent or life-threatening deterioration. Critical care was time spent personally by me on the following activities: development of treatment plan with patient and/or surrogate as well as nursing, discussions with consultants, evaluation of patient's response to treatment, examination of patient, obtaining history from patient or surrogate, ordering and performing treatments and interventions, ordering and review of laboratory studies, ordering and review of radiographic studies, pulse oximetry and re-evaluation of patient's condition.   MDM Rules/Calculators/A&P                           54 Foul smelling turbid brown liquid began eminating from the lower abdominal  wound. Patient will go to CT scan to evaluate the source of this drainage.    208 case d/w Dr. Clovis Riley of oncology at [facility] cannot accept at this time secondary to capacity.  Will place on waitlist.     Final Clinical Impression(s) / ED Diagnoses Admit to ICU    Kewan Mcnease, MD 04/16/21 CR:[id number]

## 2021-04-16 NOTE — Progress Notes (Signed)
Initial Nutrition Assessment  DOCUMENTATION CODES:   Not applicable  INTERVENTION:   TPN to meet 100% estimated nutritional needs; TPN order per Pharmacy. Discussed nutritional goals with TPN Pharmcist today   NUTRITION DIAGNOSIS:   Increased nutrient needs related to acute illness, altered GI function, post-op healing as evidenced by estimated needs.  GOAL:   Patient will meet greater than or equal to 90% of their needs  MONITOR:   Skin, Labs, Weight trends, I & O's (TPN)  REASON FOR ASSESSMENT:   Consult New TPN/TNA  ASSESSMENT:   75 yo male admitted with N/V with sepsis, peritonitis, abscess with EC fistula . PMH includes hx of right colectomy, splenectomy, omentectomy, cholecystectomy, colostomy. Pt with hx of mucinous adenocarcinoma of the appendix followed at [facility]. Pt has been on home TPN; pt also acknowledges 40 pound wt loss  NPO, unable to obtain nutrition history from patient at this time.  Pt has been on TPN at home;  plans to continue. Pharmacy note pending  No documentation of output from EC fistula available at this time Noted per RN documentation, belly button oozing yellow foul smelling substance; when pt coughs, it projects from belly button  Labs: magnesium 1.5 (L) Meds: LR at 200 ml/hr  NUTRITION - FOCUSED PHYSICAL EXAM: Unable to assess  Diet Order:   Diet Order             Diet NPO time specified  Diet effective now                   EDUCATION NEEDS:   Not appropriate for education at this time  Skin:  Skin Assessment: Skin Integrity Issues: Skin Integrity Issues:: Stage I, Other (Comment) Stage I: coccyx Other: abdomen-EC fistula  Last BM:  7/23  Height:   Ht Readings from Last 1 Encounters:  04/15/21 '5\' 11"'$  (1.803 m)    Weight:   Wt Readings from Last 1 Encounters:  04/16/21 89.1 kg      BMI:  Body mass index is 27.4 kg/m.  Estimated Nutritional Needs:   Kcal:  [id number] kcals  Protein:  135-160 g  Fluid:   >/= 2.2 L   Kerman Passey MS, RDN, LDN, CNSC Registered Dietitian III Clinical Nutrition RD Pager and On-Call Pager Number Located in [place]

## 2021-04-16 NOTE — Progress Notes (Addendum)
TPN formula from [employer] obtained.   Cristela Felt, PharmD, BCPS Clinical Pharmacist 04/16/2021 3:08 PM

## 2021-04-16 NOTE — ED Notes (Signed)
Called [facility] per Dr.Palumbo

## 2021-04-16 NOTE — ED Notes (Signed)
Provider made aware.

## 2021-04-16 NOTE — ED Notes (Signed)
NT contacted supply for ostomy kit to cover pt belly button.

## 2021-04-17 ENCOUNTER — Encounter (HOSPITAL_COMMUNITY): Payer: Self-pay | Admitting: Pulmonary Disease

## 2021-04-17 DIAGNOSIS — K650 Generalized (acute) peritonitis: Secondary | ICD-10-CM | POA: Diagnosis not present

## 2021-04-17 DIAGNOSIS — A419 Sepsis, unspecified organism: Secondary | ICD-10-CM | POA: Diagnosis not present

## 2021-04-17 DIAGNOSIS — K632 Fistula of intestine: Secondary | ICD-10-CM | POA: Diagnosis not present

## 2021-04-17 LAB — URINE CULTURE: Culture: NO GROWTH

## 2021-04-17 LAB — CBC
HCT: 24.3 % — ABNORMAL LOW (ref 39.0–52.0)
Hemoglobin: 7.1 g/dL — ABNORMAL LOW (ref 13.0–17.0)
MCH: 24.5 pg — ABNORMAL LOW (ref 26.0–34.0)
MCHC: 29.2 g/dL — ABNORMAL LOW (ref 30.0–36.0)
MCV: 83.8 fL (ref 80.0–100.0)
Platelets: 552 10*3/uL — ABNORMAL HIGH (ref 150–400)
RBC: 2.9 MIL/uL — ABNORMAL LOW (ref 4.22–5.81)
RDW: 20 % — ABNORMAL HIGH (ref 11.5–15.5)
WBC: 23.8 10*3/uL — ABNORMAL HIGH (ref 4.0–10.5)
nRBC: 1.3 % — ABNORMAL HIGH (ref 0.0–0.2)

## 2021-04-17 LAB — COMPREHENSIVE METABOLIC PANEL
ALT: 27 U/L (ref 0–44)
AST: 26 U/L (ref 15–41)
Albumin: 1.5 g/dL — ABNORMAL LOW (ref 3.5–5.0)
Alkaline Phosphatase: 83 U/L (ref 38–126)
Anion gap: 7 (ref 5–15)
BUN: 16 mg/dL (ref 8–23)
CO2: 20 mmol/L — ABNORMAL LOW (ref 22–32)
Calcium: 8.4 mg/dL — ABNORMAL LOW (ref 8.9–10.3)
Chloride: 109 mmol/L (ref 98–111)
Creatinine, Ser: 0.7 mg/dL (ref 0.61–1.24)
GFR, Estimated: >60 mL/min (ref >60)
Glucose, Bld: 137 mg/dL — ABNORMAL HIGH (ref 70–99)
Potassium: 4.2 mmol/L (ref 3.5–5.1)
Sodium: 136 mmol/L (ref 135–145)
Total Bilirubin: 0.3 mg/dL (ref 0.3–1.2)
Total Protein: 4.7 g/dL — ABNORMAL LOW (ref 6.5–8.1)

## 2021-04-17 LAB — DIFFERENTIAL
Abs Immature Granulocytes: 1.13 10*3/uL — ABNORMAL HIGH (ref 0.00–0.07)
Basophils Absolute: 0.1 10*3/uL (ref 0.0–0.1)
Basophils Relative: 0 %
Eosinophils Absolute: 0.1 10*3/uL (ref 0.0–0.5)
Eosinophils Relative: 1 %
Immature Granulocytes: 5 %
Lymphocytes Relative: 6 %
Lymphs Abs: 1.5 10*3/uL (ref 0.7–4.0)
Monocytes Absolute: 1.5 10*3/uL — ABNORMAL HIGH (ref 0.1–1.0)
Monocytes Relative: 6 %
Neutro Abs: 19.5 10*3/uL — ABNORMAL HIGH (ref 1.7–7.7)
Neutrophils Relative %: 82 %

## 2021-04-17 LAB — TRIGLYCERIDES: Triglycerides: 148 mg/dL (ref <150)

## 2021-04-17 LAB — MAGNESIUM: Magnesium: 2.2 mg/dL (ref 1.7–2.4)

## 2021-04-17 LAB — GLUCOSE, CAPILLARY
Glucose-Capillary: 135 mg/dL — ABNORMAL HIGH (ref 70–99)
Glucose-Capillary: 153 mg/dL — ABNORMAL HIGH (ref 70–99)
Glucose-Capillary: 150 mg/dL — ABNORMAL HIGH (ref 70–99)
Glucose-Capillary: 110 mg/dL — ABNORMAL HIGH (ref 70–99)
Glucose-Capillary: 104 mg/dL — ABNORMAL HIGH (ref 70–99)

## 2021-04-17 LAB — PREALBUMIN: Prealbumin: 6.6 mg/dL — ABNORMAL LOW (ref 18–38)

## 2021-04-17 LAB — PHOSPHORUS: Phosphorus: 3.3 mg/dL (ref 2.5–4.6)

## 2021-04-17 MED ORDER — VANCOMYCIN HCL 1250 MG/250ML IV SOLN: 1250.0000 mg | Freq: Two times a day (BID) | INTRAVENOUS | Status: DC

## 2021-04-17 MED ORDER — ALTEPLASE 2 MG IJ SOLR
2.0000 mg | Freq: Once | INTRAMUSCULAR | Status: DC
  Filled 2021-04-17: qty 2

## 2021-04-17 MED ORDER — HEPARIN SODIUM (PORCINE) 5000 UNIT/ML IJ SOLN: 5000.0000 [IU] | Freq: Three times a day (TID) | INTRAMUSCULAR | Status: DC

## 2021-04-17 MED ORDER — CALCIUM CARBONATE ANTACID 500 MG PO CHEW
1.0000 | CHEWABLE_TABLET | ORAL | Status: DC | PRN
  Administered 2021-04-17: 200 mg via ORAL
  Filled 2021-04-17 (×2): qty 1

## 2021-04-17 MED ORDER — LACTATED RINGERS IV SOLN: 75.0000 mL | INTRAVENOUS | 0 refills | Status: DC

## 2021-04-17 MED ORDER — POLYETHYLENE GLYCOL 3350 17 G PO PACK: 17.0000 g | PACK | Freq: Every day | ORAL | 0 refills | Status: DC | PRN

## 2021-04-17 MED ORDER — TRAVASOL 10 % IV SOLN
INTRAVENOUS | Status: DC
  Filled 2021-04-17: qty 1486.8

## 2021-04-17 MED ORDER — MORPHINE SULFATE (PF) 2 MG/ML IV SOLN
1.0000 mg | INTRAVENOUS | Status: DC | PRN
  Administered 2021-04-17 (×3): 2 mg via INTRAVENOUS
  Filled 2021-04-17 (×3): qty 1

## 2021-04-17 MED ORDER — FAMOTIDINE IN NACL 20-0.9 MG/50ML-% IV SOLN: 20.0000 mg | Freq: Two times a day (BID) | INTRAVENOUS | Status: DC

## 2021-04-17 MED ORDER — METRONIDAZOLE 500 MG/100ML IV SOLN: 500.0000 mg | Freq: Two times a day (BID) | INTRAVENOUS | Status: DC

## 2021-04-17 MED ORDER — ORAL CARE MOUTH RINSE: 15.0000 mL | Freq: Two times a day (BID) | OROMUCOSAL | 0 refills | Status: DC

## 2021-04-17 MED ORDER — PANTOPRAZOLE SODIUM 40 MG IV SOLR: 40.0000 mg | Freq: Every day | INTRAVENOUS | Status: DC

## 2021-04-17 MED ORDER — SODIUM CHLORIDE 0.9 % IV SOLN: 2.0000 g | Freq: Three times a day (TID) | INTRAVENOUS | Status: DC

## 2021-04-17 MED ORDER — DICLOFENAC SODIUM 1 % EX GEL: 4.0000 g | Freq: Four times a day (QID) | CUTANEOUS | Status: DC

## 2021-04-17 MED ORDER — ONDANSETRON HCL 4 MG/2ML IJ SOLN: 4.0000 mg | Freq: Four times a day (QID) | INTRAMUSCULAR | 0 refills | Status: DC | PRN

## 2021-04-17 MED ORDER — DOCUSATE SODIUM 100 MG PO CAPS: 100.0000 mg | ORAL_CAPSULE | Freq: Two times a day (BID) | ORAL | 0 refills | Status: DC | PRN

## 2021-04-17 MED ORDER — SODIUM CHLORIDE 0.9 % IV SOLN: INTRAVENOUS | Status: DC

## 2021-04-17 MED ORDER — CALCIUM CARBONATE ANTACID 500 MG PO CHEW: 1.0000 | CHEWABLE_TABLET | ORAL | Status: DC | PRN

## 2021-04-17 MED ORDER — FAMOTIDINE IN NACL 20-0.9 MG/50ML-% IV SOLN
20.0000 mg | Freq: Two times a day (BID) | INTRAVENOUS | Status: DC
  Administered 2021-04-17: 20 mg via INTRAVENOUS
  Filled 2021-04-17 (×2): qty 50

## 2021-04-17 NOTE — Progress Notes (Signed)
PHARMACY - TOTAL PARENTERAL NUTRITION CONSULT NOTE   Indication: Fistula  Patient Measurements: Height: '5\' 11"'$  (180.3 cm) Weight: 95.1 kg (209 lb 10.5 oz) IBW/kg (Calculated) : 75.3 TPN AdjBW (KG): 80.9 Body mass index is 29.24 kg/m. Usual Weight: 210 lbs   Assessment: 75 yo male admitted on 7/22 with nausea and vomiting. 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 Patient was recently discharged from [facility] on 7/18. 40 lbs weight los reported since April per Wife and patient is on 16 hr cyclic TPN prior to admission managed by [facility]. Last TPN was 7/22 started at 1800 and stopped ~2200 when EMS arrived.   Home TPN per label (awaiting call back and fax per [facility]): 2052m with 300g dextrose, 120g AA, and 55g lipids infused over 16 hrs  - 20 mEq/L Na chloride, 8 mEq Kcl, 4.5 mEq/L Ca gluconate, 35 mEq/L Na acetate, 17.5 mEq/L K phos, 7.5 mEq/L Mg sulfate   Glucose / Insulin: no hx DM. CBGs 125-153.  - A1c 5.3 in January 2022 Electrolytes:  Na 136. Cl 109, CO2 20, CoCa 10.4, phos 3.3. Mg 2.2 (s/p 6 g Mg total yesterday). Other electrolytes wnl.  Renal: Scr 0.7 - at baseline. BUN wnl. Hepatic: LFTs wnl. Tbili wnl. Albumin 1.5. TG 148. Prealbumin 6.6.  Intake / Output; MIVF: LR '@75ml'$ /hr. UOP 1.2 ml/kg/hr. Emesis x2 GI Imaging: - 7/22 CT abd: concerning for abscess formation, cutaneous fistulization, possible enterocutaneous fistula, and peritonitis - 7/23 CT abd: abscess formulation and cutaneous fistulization, possible EC fistula GI Surgeries / Procedures: none since admit  Central access: PICC prior to admission) TPN start date: 7/23 (on prior to admission)  Nutritional Goals (per RD recommendation on 7/23): kCal: 2300-2600, Protein: 135-160, Fluid: >/= 2.2L Goal TPN rate is 105 mL/hr (provides 149 g of protein and 2439 kcals  per day)  Current Nutrition:  NPO TPN  Plan:  Start cyclic TPN at [id number]- 74 ml/hr x1 hr followed by 148 ml/hr x 16 hrs followed by 74 ml/hr x 1 hr. This will provide 149g AA and 2439 kcal meeting 100% of needs  Electrolytes in TPN: Increase Mg to 10 mEq/L, remove Ca. Continue Na 764m/L, K 5067mL, and Phos 27m24mL. Adjust Cl:Ac to 1:2 Continue MIVF per MD Add standard MVI and trace elements to TPN Continue CBGs q4h and follow up need to add SSI Monitor TPN labs on Mon/Thurs Follow up [facility] plans [facility]  GracCristela FeltarmD, BCPS Clinical Pharmacist  04/17/2021,8:57 AM

## 2021-04-17 NOTE — Consult Note (Addendum)
Surgical Evaluation Requesting provider: Dr. Elsworth Soho  Chief Complaint: EC fistula  HPI: 75 year old man with a complex surgical history, beginning with extensive cytoreductive surgery including right colectomy, omentectomy, splenectomy, cholecystectomy, and HIPEC with oxaliplatin on December 27, 2020 at [facility] by Dr. Clovis Riley.  His postoperative course was complicated by anastomotic leak, he was readmitted in early May for treatment of large volume ascites, and then again in mid May with sepsis and ongoing feculent drainage from his surgical drains.  He was readmitted on May 16 and was discharged [facility] on June 26 after a long admission for treatment of anastomotic leak including abdominal washout and subsequent IR drains.  At the time of his takeback (5/17), it was noted there was a liter of liquid stool evacuated from the abdomen, no obvious focal point of leak was found but there was fibrinous exudate along the surface of the sigmoid colon.  He was maintained n.p.o. on tpn.  He had an NG tube which was removed and replaced several times.  His drains were ultimately removed; persistent wounds/fistulae treated with Eakin's pouches.  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  [facility] was discharged from [facility] to home 04/11/21 on IV abx (scheduled through 7/26), TPN. He followed up with [facility] palliative, not a candidate for hospice given desire to continue TPN. Presented to the ER at [facility] the evening of 7/22 with persistent weakness, fever, nausea, vomiting, dyspnea. CT demonstrates complex air and fluid rim-enhancing collection insinuating throughout multiple loops of small bowel in the mid abdomen and extending to the region of more extensive phlegmonous changes and cutaneous fistulization in the low midline abdominal wall.  There is additionally a fistula in the right lower quadrant.   He is awaiting a bed at [facility], surgery team is requested to follow while he is here.  Allergies  Allergen Reactions   Atenolol Other (See Comments)    Unknown reaction   Atorvastatin Other (See Comments)    Unknown reaction   Darvocet [Propoxyphene N-Acetaminophen] Other (See Comments)    Unknown reaction   Paroxetine Other (See Comments)    Unknown reaction   Pregabalin Other (See Comments)    Unknown reaction   Propoxyphene Hcl Other (See Comments)    Unknown reaction    Past Medical History:  Diagnosis Date   Allergy    BPH (benign prostatic hypertrophy)    Elevated serum immunoglobulin free light chains 04/28/2014   Erythrocytosis 04/28/2014   Exertional dyspnea    ETT myoview 5/11: 8 min, no ischemic ECG changes, stopped due to fatigue/ SOB. EF 60%, no ischemia or infarction. Echo 5/11 EF 60%, no significant valvular abnormalities, mild RV dilation and dysfunction, no complete TR doppler jet so PA pressure hard to gauge. 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 R heart cath 6/11: mean RA 10 mmHg, PA 25/18, mean PCWP 13 mmHg, CI 2.1   Fibromyalgia    GERD (gastroesophageal reflux disease)    History of epididymitis    History of head injury without skull fracture 1980   Concussion   Hx of adenomatous polyp of colon 06/19/2007   Hyperlipidemia    Myalgias with Lipitor   Hypertension    Hypogonadism male    Myofascial pain syndrome    Nephrolithiasis    Prolapsed internal hemorrhoids    Prostatitis    Recurrent   Rheumatic fever  x3 at ages [age] and 27; No significant valvular abnormality on 5/11 echo   Tendinitis 2000   Hx of left bicep   Vitamin D deficiency     Past Surgical History:  Procedure Laterality Date   BIH REPAIR  08/2005   Incarverated fat by mesh repair   CARDIAC CATHETERIZATION  02/2010   Left and right   COLONOSCOPY  06/09/2015   [facility]   FINGER SURGERY  2000   Repair of injury to right fifth finger   HEEL SPUR SURGERY  1960   Bilateral    TONSILLECTOMY  1962    Family History  Problem Relation Age of Onset   Hypertension Mother    Breast cancer Mother    Aortic stenosis Father 6   Hypertension Other    Aortic stenosis Other    Breast cancer Other    Diabetes Other    Other Other        ASHD and epilepsy   Stroke Neg Hx    Colon cancer Neg Hx    Colon polyps Neg Hx    Esophageal cancer Neg Hx    Rectal cancer Neg Hx    Stomach cancer Neg Hx     Social History   Socioeconomic History   Marital status: Married    Spouse name: Not on file   Number of children: Not on file   Years of education: Not on file   Highest education level: Not on file  Occupational History   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 [employer]    Employer: OTHER    Comment: Full time  Tobacco Use   Smoking status: Never   Smokeless tobacco: Never  Vaping Use   Vaping Use: Never used  Substance and Sexual Activity   Alcohol use: Yes    Alcohol/week: 12.0 standard drinks    Types: 12 Cans of beer per week    Comment: beer and liquoer   Drug use: No   Sexual activity: Not on file  Other Topics Concern   Not on file  Social History Narrative   Married, lives in [place] Determinants of Health   Financial Resource Strain: Not on file  Food Insecurity: Not on file  Transportation Needs: Not on file  Physical Activity: Not on file  Stress: Not on file  Social Connections: Not on file    No current facility-administered medications on file prior to encounter.   Current Outpatient Medications on File Prior to Encounter  Medication Sig Dispense Refill   acetaminophen (TYLENOL) 325 MG tablet Take 650 mg by mouth every 6 (six) hours as needed for headache or fever (pain).     acetaminophen (TYLENOL) 650 MG suppository Place 650 mg rectally every 4 (four) hours as needed for fever (pain).     aspirin 81 MG chewable tablet Take 81 mg by mouth daily.     buPROPion (WELLBUTRIN) 75 MG  tablet Take 150 mg by mouth 2 (two) times daily.     cholecalciferol (VITAMIN D3) 25 MCG (1000 UNIT) tablet Take 1,000 Units by mouth daily.     docusate sodium (COLACE) 100 MG capsule Take 100 mg by mouth 2 (two) times daily.     dronabinol (MARINOL) 2.5 MG capsule Take 2.5 mg by mouth 2 (two) times daily.     enoxaparin (LOVENOX) 40 MG/0.4ML injection Inject 40 mg into the skin daily.     fat emul,SMOFlipid, (SMOFLIPID) 20 % EMUL infusion  Inject 250 mLs into the vein continuous. Administer intravenously at 25 ml/hr Monday thru Friday at 1800     lidocaine (LIDODERM) 5 % Place 1 patch onto the skin daily. Apply to left ankle. Remove & Discard patch within 12 hours or as directed by MD     loratadine (CLARITIN) 10 MG tablet Take 10 mg by mouth daily.     LORazepam (ATIVAN) 1 MG tablet Take 1 mg by mouth every 8 (eight) hours as needed for anxiety (nausea).     losartan (COZAAR) 50 MG tablet Take 50 mg by mouth daily.     melatonin 3 MG TABS tablet Take 6 mg by mouth at bedtime.     Ondansetron HCl 4 MG/2ML SOSY Inject 4 mg into the vein every 6 (six) hours as needed (nausea/vomiting).     pantoprazole (PROTONIX) 40 MG injection Inject 40 mg into the vein 2 (two) times daily.     piperacillin-tazobactam (ZOSYN) 3.375 (3-0.375) g injection Inject 3.375 g into the muscle every 6 (six) hours.     polyethylene glycol (MIRALAX / GLYCOLAX) 17 g packet Take 17 g by mouth daily as needed. Mix in 8 oz water     promethazine (PHENERGAN) 25 MG/ML injection Inject 12.5 mg into the vein every 6 (six) hours as needed for nausea or vomiting.     sertraline (ZOLOFT) 50 MG tablet Take 100 mg by mouth daily.     simethicone (MYLICON) 80 MG chewable tablet Chew 80 mg by mouth 4 (four) times daily as needed for flatulence.     tamsulosin (FLOMAX) 0.4 MG CAPS capsule Take 0.4 mg by mouth daily.     buPROPion (WELLBUTRIN XL) 300 MG 24 hr tablet Take  1 tablet  Daily for Mood            TAKE 1 TABLET BY MOUTH DAILY FOR  MOOD (Patient not taking: No sig reported) 90 tablet 1   cetirizine (ZYRTEC) 10 MG tablet Take 1 tablet (10 mg total) by mouth at bedtime. (Patient not taking: No sig reported) 90 tablet 1   famotidine (PEPCID) 40 MG tablet TAKE 1 TABLET BY MOUTH ONCE DAILY FOR INDIGESTION AND HEARTBURN (Patient not taking: No sig reported) 90 tablet 2   hydrochlorothiazide (HYDRODIURIL) 25 MG tablet TAKE 1 TABLET BY MOUTH ONCE DAILY FOR BLOOD PRESSURE AND FLUID RETENTION (Patient not taking: No sig reported) 90 tablet 3   olmesartan (BENICAR) 40 MG tablet TAKE 1 TABLET DAILY FOR BLOOD PRESSURE (Patient not taking: No sig reported) 90 tablet 3   ondansetron (ZOFRAN) 4 MG tablet Take 1 tablet (4 mg total) by mouth every 8 (eight) hours as needed for nausea or vomiting. (Patient not taking: No sig reported) 60 tablet 0   pantoprazole (PROTONIX) 40 MG tablet TAKE 1 TABLET DAILY FOR INDIGESTION & HEARTBURN (Patient not taking: No sig reported) 90 tablet 3   promethazine (PHENERGAN) 25 MG suppository Place 1 suppository (25 mg total) rectally every 6 (six) hours as needed for nausea or vomiting. (Patient not taking: No sig reported) 60 each 1   rosuvastatin (CRESTOR) 40 MG tablet Take 1 tablet daily for Cholesterol (Patient not taking: No sig reported) 90 tablet 3    Review of Systems: a complete, 10pt review of systems was completed with pertinent positives and negatives as documented in the HPI  Physical Exam: Vitals:   04/17/21 0500 04/17/21 0600  BP: 131/73 119/71  Pulse: 89 96  Resp: (!) 27 (!) 28  Temp:  SpO2: 95% 95%   Gen: Alert, chronically ill-appearing Eyes: lids and conjunctivae normal, no icterus. Pupils equally round and reactive to light.  Neck: supple without mass or thyromegaly Chest: respiratory effort is normal. No crepitus or tenderness on palpation of the chest. Breath sounds equal.  Cardiovascular: RRR with palpable distal pulses, diffuse anasarca Gastrointestinal: Distended, tender over  the right lower quadrant.  There is a healed vision at the inferior aspect of which is an opening that is draining serosanguineous appearing fluid to an Eakin's pouch.  In the right lower quadrant there is a small wound which drains seropurulent fluid when compressed, this area is tender.  In the right upper quadrant has an Eakin's pouch which is draining straight bilious fluid approximately 2.5 cm wound. Muscoloskeletal: no clubbing or cyanosis of the fingers.  Strength is symmetrical throughout.  Range of motion of bilateral upper and lower extremities normal without pain, crepitation or contracture. Neuro: cranial nerves grossly intact.  Sensation intact to light touch diffusely. Psych: appropriate mood and affect, normal insight/judgment intact  Skin: warm and dry   CBC Latest Ref Rng & Units 04/17/2021 04/16/2021 04/15/2021  WBC 4.0 - 10.5 K/uL 23.8(H) 53.4(HH) 48.7(H)  Hemoglobin 13.0 - 17.0 g/dL 7.1(L) 8.0(L) 8.0(L)  Hematocrit 39.0 - 52.0 % 24.3(L) 26.5(L) 26.2(L)  Platelets 150 - 400 K/uL 552(H) 601(H) 454(H)    CMP Latest Ref Rng & Units 04/17/2021 04/16/2021 04/15/2021  Glucose 70 - 99 mg/dL 137(H) 106(H) 100(H)  BUN 8 - 23 mg/dL '16 15 19  '$ Creatinine 0.61 - 1.24 mg/dL 0.70 0.83 0.85  Sodium 135 - 145 mmol/L 136 134(L) 135  Potassium 3.5 - 5.1 mmol/L 4.2 4.0 3.6  Chloride 98 - 111 mmol/L 109 104 105  CO2 22 - 32 mmol/L 20(L) 20(L) 20(L)  Calcium 8.9 - 10.3 mg/dL 8.4(L) 8.7(L) 8.5(L)  Total Protein 6.5 - 8.1 g/dL 4.7(L) - 5.1(L)  Total Bilirubin 0.3 - 1.2 mg/dL 0.3 - 0.6  Alkaline Phos 38 - 126 U/L 83 - 116  AST 15 - 41 U/L 26 - 41  ALT 0 - 44 U/L 27 - 41    Lab Results  Component Value Date   INR 1.3 (H) 04/15/2021   INR 1.0 ratio 02/22/2010    Imaging: CT ABDOMEN PELVIS W CONTRAST  Result Date: 04/16/2021 CLINICAL DATA:  Abdominal pain EXAM: CT ABDOMEN AND PELVIS WITH CONTRAST TECHNIQUE: Multidetector CT imaging of the abdomen and pelvis was performed using the standard  protocol following bolus administration of intravenous contrast. CONTRAST:  167m OMNIPAQUE IOHEXOL 300 MG/ML  SOLN COMPARISON:  CT 09/28/2020. FINDINGS: Lower chest: Moderate left and trace right pleural effusions with adjacent areas of passive atelectasis including subtotal atelectatic collapse left lower lobe. Additional bandlike areas of more further subsegmental atelectasis in both lower lobes. Cardiac size is top normal. Calcification the coronary arteries. No pericardial effusion. Terminus of a central venous catheter seen at the superior cavoatrial junction. Distal thoracic aortic atherosclerosis. Hepatobiliary: No visible focal liver lesion. Smooth liver surface contour. Normal hepatic attenuation. Gallbladder appears surgically absent. No significant biliary ductal dilatation. Pancreas: No pancreatic ductal dilatation or surrounding inflammatory changes. Spleen: Appears surgically absent. Adrenals/Urinary Tract: No suspicious adrenal lesions.Bilateral perinephric stranding, nonspecific. Kidneys enhance symmetrically and uniformly. No concerning focal renal lesion. Bilateral extrarenal pelves. Circumferential thickening of the urinary bladder, possibly reactive. Stomach/Bowel: Distal esophagus proximal stomach unremarkable. Some questionable thickening towards the gastric antrum as well as a fluid-filled duodenal diverticulum at the level of the duodenal bulb.  Surrounding inflammatory changes of the duodenum are nonspecific given the more diffuse inflammation throughout the bowel elsewhere. Much of the small bowel and colon, particularly those loops clustered in the low anterior mid abdomen appear diffusely thickened with some faint mucosal hyperenhancement best seen on narrow windowing. A complex multiloculated fluid collection is seen insinuating throughout these clustered loops bowel which extends to a more focal, loculated rim enhancing collection in the low anterior abdomen with the largest loculation  measuring approximately 6.9 x 7.4 x 6.1 cm extending to the level of the skin surface with surrounding phlegmon in the adjacent subcutaneous tissues and along the anterior abdominal wall rectus sheath. Additional peritonitis features throughout the abdomen and pelvis. Suture line noted at the tip of the cecum compatible with postsurgical changes from prior surgical excision of a mucocele seen on comparison CT exam. No evidence of high-grade bowel obstruction. Diffusely fluid-filled appearance of the colon compatible with a rapid transit state. Vascular/Lymphatic: Extensive atherosclerotic plaque throughout the abdominal aorta and branch vessels. Reactive appearing mesenteric nodes are present Reproductive: Benign-appearing prostate calcifications. Some prostate seminal vesicles are otherwise unremarkable. Other: Complex rim enhancing collection insinuating through the low anterior abdomen and extending to the skin surface, as detailed above. Additional postsurgical changes along the anterior abdominal wall including a tract of gas and air in the right lower quadrant, could reflect an additional intracutaneous fistula along prior surgical tract, correlate with visual inspection. No bowel containing hernia. Musculoskeletal: Multilevel degenerative changes are present in the imaged portions of the spine. The osseous structures appear diffusely demineralized which may limit detection of small or nondisplaced fractures. Stable compression deformity T12 with up to 30% height loss anteriorly. Grade 1 anterolisthesis L4 on L5 without pars defects. No acute osseous abnormality or suspicious osseous lesion. Degenerative changes in the bilateral hips as well. IMPRESSION: Extensive postsurgical changes the abdomen with resection of a previously seen mucocele. Complex air and fluid containing rim enhancing collection insinuating throughout multiple loops of small bowel in the mid abdomen and extending to a region of more  extensive phlegmonous changes and likely cutaneous fistulization in the low midline abdominal wall. Appearance is consistent with abscess formation and cutaneous fistulization. Possible enterocutaneous fistula given the appearance the involved bowel loops. Additional tract punctate gas and fluid seen along the right lower quadrant, presumably at the site of port placement though patient's operative report is unavailable at the time of exam. Alternative site of cutaneous fistulization is not fully excluded given that the terminus of this collection extends to bowel loops in the right lower quadrant. Diffuse features of peritonitis, peritoneal seeding would be difficult to fully exclude given the appearance of the abdomen in the setting of mucocele resection. Additional features of anasarca/volume overload with bilateral pleural effusions and body wall edema. These results were called by telephone at the time of interpretation on 04/16/2021 at 1:50 am to provider [facility] , who verbally acknowledged these results. Electronically Signed   By: Lovena Le M.D.   On: 04/16/2021 01:50   DG Chest Port 1 View  Result Date: 04/15/2021 CLINICAL DATA:  Questionable sepsis - evaluate for abnormality EXAM: PORTABLE CHEST 1 VIEW COMPARISON:  04/05/2021 FINDINGS: Right PICC tip at the cavoatrial junction. Unchanged left basilar atelectasis/consolidation and small left pleural effusion. IMPRESSION: Unchanged left basilar atelectasis/consolidation and small left pleural effusion. Electronically Signed   By: Ulyses Jarred M.D.   On: 04/15/2021 23:11     A/P: 75 year old gentleman with a very complicated course following cytoreductive  surgery with hipec at [facility] 3 months ago. Multiple EC fistulae with bilious drainage RUQ, seropurulent RLQ, seropurulent lower midline drainage. Strict I&O/ monitor fluid status as the RUQ may be somewhat high output.  Would continue n.p.o./TPN, empiric antibiotics. Could consider  adding antifungal.  NG if nausea/emesis persists. Could consider consulting with IR to drain the largest collection in the lower midline or whatever else is accessible.  Unfortunately attempting surgical intervention in this setting will only result in additional fistulae.    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

## 2021-04-17 NOTE — Progress Notes (Addendum)
NAME:  Curtis, Price MRN:  [id number], DOB:  Sep 16, 1946, LOS: 1 ADMISSION DATE:  04/15/2021, CONSULTATION DATE:  7/23 REFERRING MD:  Dr. Randal Buba, CHIEF COMPLAINT:  nausea and vomiting   History of Present Illness:  Curtis Price ,is a 75 y.o. male, who presented to the [facility] ED  on 7/22 with a chief complaint of nausea and vomiting  [facility]. At home he developed nausea and vomiting and he would fever at night. This continued to get worse which resulted in presentation to the [facility] ED the evening of 7/22  On arrival he was found to be tachycardic, febrile at 103.1, and hypoxic with a O2 sat of 84 which improved with 4LNC. ED course was notable for a lactate of 2.2 which increased to 4.7, WBC of 48.7, HGB 8, albumin of 1.7. While in the ED a wound below his umbilicus began to ooze a yellow brown substance with foul smell. A CT of the abdomen was obtained which was concerning for abscess formation, cutaneous fistulization, and peritonitis. Cultures were ordered, 65m/kg fluid was administered, and he was started on Vanc/Cefepime/Flagyl. Surgery was consulted by the ED. [facility] for [facility].  PCCM was asked to admit the patient by the ED.  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  His history was obtained from his wife SAadin Latondue to the patient being in pain and not wanting to be interviewed. Mr. AProppshas a complex surgical history which began on 12/28/20 where he [facility] ultimately resulted in a right colectomy, splenectomy, omentectomy and cholecystectomy. His hospital course was complicated by anastomic leaks and abscesses at drain sites. He has  lost a reported 40 lbs since April. [facility] and was discharged to home on 7/18 on TPN.   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  Significant Hospital Events: Including procedures, antibiotic start and stop dates in addition to other pertinent events   [facility], hypotensive, improved with fluid  Interim History / Subjective:  Afebrile, hypotensive yesterday responded to fluids. Good urine output last 24 hours. Complains of abdominal pain  Objective   Blood pressure 119/71, pulse 96, temperature 98.3 F (36.8 C), temperature source Oral, resp. rate (!) 28, height '5\' 11"'$  (1.803 m), weight 95.1 kg, SpO2 95 %. On 4LNC        Intake/Output Summary (Last 24 hours) at 04/17/2021 1138 Last data filed at 04/17/2021 0854 Gross per 24 hour  Intake 5145.74 ml  Output 2875 ml  Net 2270.74 ml    Filed Weights   04/15/21 2339 04/16/21 0500 04/17/21 0500  Weight: 97.5 kg 89.1 kg 95.1 kg    Examination: General:  ill appearing, in bed, NAD HEENT: MM dry, anicteric, atraumatic Neuro: MAE, RASS 0, follows commands, PERRL 372mCV: S1S2, ST, no m/r/g appreciated PULM: No accessory muscle use, clear breath sounds bilateral GI: Distended, firm, hypoactive bowel sounds, right lower quadrant ostomy Extremities: warm/dry, trace pretibial edema, capillary refill less than 3 seconds  Skin: erythema to RLQ drain site, yellow drainage from  pink wound inferior to umbilicus.   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  -Appears to have responded to fluids and does not need pressors -On Cefepime, and flagyl.  Cultures negative so far, will DC vancomycin since MRSA PCR negative -added empiric anidulafungin -Appreciate surgery recommendations, will ask IR to evaluate for abdominal abscess drainage -Transfer to [facility] when beds available  Acute respiratory failure with hypoxia L Pleural Effusion- as seen on 7/23 CT, suspect secondary to malnutrition. Albumin 1.7. -Improved, on 2 L nasal cannula  Anemia, Normocytic-Suspect chronic due to malnutrition HGB 8 -Transfuse PRBC if HBG less than 7 -Obtain CBC daily -Monitor for signs of bleeding  Malnutrition, severe on TPN PTA Reported 40LB weight loss since April -TPN consult  Nausea and vomiting -Continue NPO -PRN zofran  HX GERD -On PPI  HX HTN -Hold home antihypertensives at this time due to sepsis.  HX mucinous adenocarcinoma of the appendix -Transfer to [facility] when bed available. Per ED discussed with Dr. Clovis Riley of oncology.   Best Practice (right click and "Reselect all SmartList Selections" daily)   Diet/type: NPO DVT prophylaxis: prophylactic heparin  GI prophylaxis: PPI Lines: yes and it is still needed PICC in place PTA Foley:  Yes, and it is still needed Code Status:  DNR Last date of multidisciplinary goals of care discussion [facility] per wife 7/23] I explained to him that it is likely he may need another abdominal surgery  Labs   CBC: Recent Labs  Lab 04/15/21 2252 04/16/21 0348 04/17/21 0121  WBC 48.7* 53.4* 23.8*  NEUTROABS 38.6*  --  19.5*  HGB 8.0* 8.0* 7.1*  HCT 26.2* 26.5* 24.3*  MCV 81.1 80.5 83.8  PLT 454* 601* 552*     Basic Metabolic Panel: Recent Labs  Lab 04/11/21 0805 04/15/21 2252 04/16/21 0348 04/16/21 0359 04/17/21 0121   NA  --  135  --  134* 136  K  --  3.6  --  4.0 4.2  CL  --  105  --  104 109  CO2  --  20*  --  20* 20*  GLUCOSE  --  100*  --  106* 137*  BUN  --  19  --  15 16  CREATININE  --  0.85  --  0.83 0.70  CALCIUM  --  8.5*  --  8.7* 8.4*  MG 1.4*  --   --  1.5* 2.2  PHOS  --   --  3.7  --  3.3    GFR: Estimated Creatinine Clearance: 95.3 mL/min (by C-G formula based on SCr of 0.7 mg/dL). Recent Labs  Lab 04/15/21 2252 04/16/21 0322 04/16/21 0348 04/16/21 1230 04/17/21 0121  WBC 48.7*  --  53.4*  --  23.8*  LATICACIDVEN 2.2* 4.7*  --  1.7  --      Liver Function Tests: Recent Labs  Lab 04/15/21 2252 04/17/21 0121  AST 41 26  ALT 41 27  ALKPHOS 116 83  BILITOT 0.6 0.3  PROT 5.1* 4.7*  ALBUMIN 1.7* 1.5*    No results for input(s): LIPASE, AMYLASE in the last 168 hours. No results for input(s): AMMONIA in the  last 168 hours.  ABG    Component Value Date/Time   PHART 7.374 02/25/2010 0825   PCO2ART 45.1 (H) 02/25/2010 0825   PO2ART 81.0 02/25/2010 0825   HCO3 26.3 (H) 02/25/2010 0825   TCO2 28 02/25/2010 0825   O2SAT 96.0 02/25/2010 0825      Coagulation Profile: Recent Labs  Lab 04/15/21 2252  INR 1.3*     Cardiac Enzymes: No results for input(s): CKTOTAL, CKMB, CKMBINDEX, TROPONINI in the last 168 hours.  HbA1C: Hgb A1c MFr Bld  Date/Time Value Ref Range Status  10/08/2020 10:21 AM 5.3 <5.7 % of total Hgb Final    Comment:    For the purpose of screening for the presence of diabetes: . <5.7%       Consistent with the absence of diabetes 5.7-6.4%    Consistent with increased risk for diabetes             (prediabetes) > or =6.5%  Consistent with diabetes . This assay result is consistent with a decreased risk of diabetes. . Currently, no consensus exists regarding use of hemoglobin A1c for diagnosis of diabetes in children. . According to American Diabetes Association (ADA) guidelines, hemoglobin A1c <7.0% represents optimal control in  non-pregnant diabetic patients. Different metrics may apply to specific patient populations.  Standards of Medical Care in Diabetes(ADA). Marland Kitchen   03/19/2020 10:11 AM 5.3 <5.7 % of total Hgb Final    Comment:    For the purpose of screening for the presence of diabetes: . <5.7%       Consistent with the absence of diabetes 5.7-6.4%    Consistent with increased risk for diabetes             (prediabetes) > or =6.5%  Consistent with diabetes . This assay result is consistent with a decreased risk of diabetes. . Currently, no consensus exists regarding use of hemoglobin A1c for diagnosis of diabetes in children. . According to American Diabetes Association (ADA) guidelines, hemoglobin A1c <7.0% represents optimal control in non-pregnant diabetic patients. Different metrics may apply to specific patient populations.  Standards of Medical Care in Diabetes(ADA). .     CBG: Recent Labs  Lab 04/16/21 1522 04/16/21 1910 04/16/21 2336 04/17/21 0323 04/17/21 0713  GLUCAP 94 125* 124* 135* 153*    Review of Systems:   Positives in bold  Gen: fever, chills, weight change, fatigue, night sweats HEENT:  blurred vision, double vision, hearing loss, tinnitus, sinus congestion, rhinorrhea, sore throat, neck stiffness, dysphagia PULM:  shortness of breath, cough, sputum production, hemoptysis, wheezing CV: chest pain, edema, orthopnea, paroxysmal nocturnal dyspnea, palpitations GI:  abdominal pain, nausea, vomiting, diarrhea, hematochezia, melena, constipation, change in bowel habits GU: dysuria, hematuria, polyuria, oliguria, urethral discharge Endocrine: hot or cold intolerance, polyuria, polyphagia or appetite change Derm: rash, dry skin, scaling or peeling skin change Heme: easy bruising, bleeding, bleeding gums Neuro: headache, numbness, weakness, slurred speech, loss of memory or consciousness   Past Medical History:  He,  has a past medical history of Allergy, BPH (benign  prostatic hypertrophy), 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   Surgical History:   Past Surgical History:  Procedure Laterality Date   BIH REPAIR  08/2005   Incarverated fat  by mesh repair   CARDIAC CATHETERIZATION  02/2010   Left and right   COLONOSCOPY  06/09/2015   [facility] SURGERY  2000   Repair of injury to right fifth finger   HEEL SPUR SURGERY  1960   Bilateral   TONSILLECTOMY  1962     Social History:   reports that he has never smoked. He has never used smokeless tobacco. He reports current alcohol use of about 12.0 standard drinks of alcohol per week. He reports that he does not use drugs.   Family History:  His family history includes Aortic stenosis in an other family member; Aortic stenosis (age of onset: 76) in his father; Breast cancer in his mother and another family member; Diabetes in an other family member; Hypertension in his mother and another family member; Other in an other family member. There is no history of Stroke, Colon cancer, Colon polyps, Esophageal cancer, Rectal cancer, or Stomach cancer.   Allergies Allergies  Allergen Reactions   Atenolol Other (See Comments)    Unknown reaction   Atorvastatin Other (See Comments)    Unknown reaction   Darvocet [Propoxyphene N-Acetaminophen] Other (See Comments)    Unknown reaction   Paroxetine Other (See Comments)    Unknown reaction   Pregabalin Other (See Comments)    Unknown reaction   Propoxyphene Hcl Other (See Comments)    Unknown reaction     Home  Medications  Prior to Admission medications   Medication Sig Start Date End Date Taking? Authorizing Provider  aspirin 81 MG EC tablet Take 81 mg by mouth daily.    [provider]  B Complex-C (SUPER B COMPLEX PO) Take 1 tablet by mouth 2 (two) times daily.     [provider]  buPROPion (WELLBUTRIN XL) 300 MG 24 hr tablet Take  1 tablet  Daily for Mood            TAKE 1 TABLET BY MOUTH DAILY FOR MOOD 11/21/20   [name], MD  cetirizine (ZYRTEC) 10 MG tablet Take 1 tablet (10 mg total) by mouth at bedtime. 11/16/16   [name], MD  famotidine (PEPCID) 40 MG tablet TAKE 1 TABLET BY MOUTH ONCE DAILY FOR INDIGESTION AND HEARTBURN 08/02/20   McClanahan, Danton Sewer, NP  fish oil-omega-3 fatty acids 1000 MG capsule Take 1 capsule by mouth 2 (two) times daily.    [provider]  hydrochlorothiazide (HYDRODIURIL) 25 MG tablet TAKE 1 TABLET BY MOUTH ONCE DAILY FOR BLOOD PRESSURE AND FLUID RETENTION 02/05/20   [name], MD  Magnesium 250 MG TABS Take 1 tablet by mouth 2 (two) times daily.    [provider]  Multiple Vitamins-Minerals (STRESS B COMPLEX/ZINC) TABS Take 1 tablet by mouth daily.    [provider]  olmesartan (BENICAR) 40 MG tablet TAKE 1 TABLET DAILY FOR BLOOD PRESSURE 02/12/21   [name], MD  ondansetron (ZOFRAN) 4 MG tablet Take 1 tablet (4 mg total) by mouth every 8 (eight) hours as needed for nausea or vomiting. 04/12/21   Liane Comber, NP  OVER THE COUNTER MEDICATION Takes calcium, magnesium zinc vitamin 2 times a day.    [provider]  pantoprazole (PROTONIX) 40 MG tablet TAKE 1 TABLET DAILY FOR INDIGESTION & HEARTBURN 02/07/21   Liane Comber, NP  promethazine (PHENERGAN) 25 MG suppository Place 1 suppository (25 mg total) rectally every 6 (six) hours as needed for nausea or vomiting. 04/12/21   Liane Comber, NP  rosuvastatin (CRESTOR)  40 MG tablet Take 1 tablet daily for Cholesterol 02/05/20   [name], MD  vitamin C (ASCORBIC ACID) 500 MG tablet Take 500 mg by mouth daily.    [provider]  VITAMIN D PO Take 5,000 Units by mouth 2 (two) times daily.    [provider]  zinc gluconate 50 MG tablet Take 50 mg by mouth daily.    [provider]     Kara Mead MD. FCCP. [place] [facility] Pager : [phone]  If no response to pager , please call [phone] until 7 pm After 7:00 pm call Elink  [phone]    04/17/2021 , 11:38 AM

## 2021-04-17 NOTE — Progress Notes (Signed)
[facility] Progress Note Patient Name: Curtis Price. DOB: 05-29-1946 MRN: [id number]   Date of Service  04/17/2021  HPI/Events of Note  GERD. Request for PRN.  eICU Interventions  Ordered Tums 1 tab Q4H PRN.     Intervention Category Intermediate Interventions: Other:  Charlott Rakes 04/17/2021, 12:51 AM

## 2021-04-17 NOTE — H&P (Addendum)
Chief Complaint: Patient was seen in consultation today for image guided aspiration and possible drain placement for an intraabdominal fluid collection  Chief Complaint  Patient presents with   Fever   Emesis   Shortness of Breath   at the request of Dr. Elsworth Soho, R.   Referring Physician(s): Dr. Elsworth Soho, R  Supervising Physician: Aletta Edouard  Patient Status: [facility] - In-pt  History of Present Illness: Curtis Price. is a 75 y.o. male with past medical history of HTN, HLD, nephrolithiasis, GERD, BPH, and complex surgical history with extensive cytoreductive surgery including right colectomy, omentectomy, splenectomy, cholecystectomy, and Hypaque with oxaliplatin on December 27, 2020 at [facility] by Dr. Clovis Riley.   His postoperative course was complicated by anastomotic leak, has been hospitalized multiple times since May 2022, patient was ultimately discharged to [facility] on March 20, 2021.  Patient was sent to [facility] ED from the [facility] with concerns for sepsis on 04/16/2021.  CBC on 04/16/2021 was notable with WBC 53.4, and platelet 601, lactic acid 4.7.  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  Patient was readmitted under the care of critical care.  IR was requested for image guided aspiration and possible drain placement for the intra-abdominal fluid collection. Case was reviewed and approved for CT-guided aspiration and possible drain placement by Dr. Kathlene Cote.   Patient laying in bed, not in acute distress.  Denise headache, fever, chills, shortness of breath, cough, chest pain, abdominal pain, nausea  ,vomiting, and bleeding.    Past Medical History:  Diagnosis Date   Allergy    BPH (benign prostatic hypertrophy)    Elevated serum immunoglobulin free light chains 04/28/2014   Erythrocytosis 04/28/2014   Exertional dyspnea    ETT myoview 5/11: 8 min, no ischemic ECG changes, stopped due to fatigue/ SOB. EF 60%, no ischemia or infarction. Echo 5/11 EF 60%, no significant valvular abnormalities, mild RV dilation and dysfunction, no complete TR doppler jet so PA pressure hard to gauge. 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 R heart cath 6/11: mean RA 10 mmHg, PA 25/18, mean PCWP 13 mmHg, CI 2.1   Fibromyalgia    GERD (gastroesophageal reflux disease)    History of epididymitis    History of head injury without skull fracture 1980   Concussion   Hx of adenomatous polyp of colon 06/19/2007   Hyperlipidemia    Myalgias with Lipitor   Hypertension    Hypogonadism male    Myofascial pain syndrome    Nephrolithiasis    Prolapsed internal hemorrhoids    Prostatitis    Recurrent   Rheumatic fever    x3 at ages [age] and 68; No significant valvular abnormality on 5/11 echo   Tendinitis 2000   Hx of left bicep   Vitamin D deficiency     Past Surgical History:  Procedure Laterality Date   BIH REPAIR  08/2005   Incarverated fat by mesh repair   CARDIAC CATHETERIZATION  02/2010   Left and right   COLONOSCOPY  06/09/2015   [facility]   FINGER SURGERY  2000   Repair of injury to right fifth finger   [place]  Bilateral   TONSILLECTOMY  1962    Allergies: Atenolol, Atorvastatin, Darvocet [propoxyphene n-acetaminophen], Paroxetine, Pregabalin, and Propoxyphene hcl  Medications: Prior to Admission medications   Medication Sig Start Date End Date Taking? Authorizing Provider  acetaminophen (TYLENOL) 325 MG tablet Take 650 mg by mouth every 6 (six) hours as needed for headache or fever (pain).   Yes [provider]  acetaminophen  (TYLENOL) 650 MG suppository Place 650 mg rectally every 4 (four) hours as needed for fever (pain).   Yes [provider]  aspirin 81 MG chewable tablet Take 81 mg by mouth daily.   Yes [provider]  buPROPion (WELLBUTRIN) 75 MG tablet Take 150 mg by mouth 2 (two) times daily.   Yes [provider]  cholecalciferol (VITAMIN D3) 25 MCG (1000 UNIT) tablet Take 1,000 Units by mouth daily.   Yes [provider]  docusate sodium (COLACE) 100 MG capsule Take 100 mg by mouth 2 (two) times daily.   Yes [provider]  dronabinol (MARINOL) 2.5 MG capsule Take 2.5 mg by mouth 2 (two) times daily.   Yes [provider]  enoxaparin (LOVENOX) 40 MG/0.4ML injection Inject 40 mg into the skin daily.   Yes [provider]  fat emul,SMOFlipid, (SMOFLIPID) 20 % EMUL infusion Inject 250 mLs into the vein continuous. Administer intravenously at 25 ml/hr Monday thru Friday at 1800   Yes [provider]  lidocaine (LIDODERM) 5 % Place 1 patch onto the skin daily. Apply to left ankle. Remove & Discard patch within 12 hours or as directed by MD   Yes [provider]  loratadine (CLARITIN) 10 MG tablet Take 10 mg by mouth daily.   Yes [provider]  LORazepam (ATIVAN) 1 MG tablet Take 1 mg by mouth every 8 (eight) hours as needed for anxiety (nausea).   Yes [provider]  losartan (COZAAR) 50 MG tablet Take 50 mg by mouth daily.   Yes [provider]  melatonin 3 MG TABS tablet Take 6 mg by mouth at bedtime.   Yes [provider]  Ondansetron HCl 4 MG/2ML SOSY Inject 4 mg into the vein every 6 (six) hours as needed (nausea/vomiting).   Yes [provider]  pantoprazole (PROTONIX) 40 MG injection Inject 40 mg into the vein 2 (two) times daily.   Yes [provider]  piperacillin-tazobactam (ZOSYN) 3.375 (3-0.375) g injection Inject 3.375 g into the muscle every 6 (six) hours.   Yes  [provider]  polyethylene glycol (MIRALAX / GLYCOLAX) 17 g packet Take 17 g by mouth daily as needed. Mix in 8 oz water   Yes [provider]  promethazine (PHENERGAN) 25 MG/ML injection Inject 12.5 mg into the vein every 6 (six) hours as needed for nausea or vomiting.   Yes [provider]  sertraline (ZOLOFT) 50 MG tablet Take 100 mg by mouth daily.   Yes [provider]  simethicone (MYLICON) 80 MG chewable tablet Chew 80 mg by mouth 4 (four) times daily as needed for flatulence.   Yes [provider]  tamsulosin (FLOMAX) 0.4 MG CAPS capsule Take 0.4 mg by mouth daily.   Yes [provider]  buPROPion (WELLBUTRIN XL) 300 MG 24 hr tablet Take  1 tablet  Daily for Mood            TAKE 1 TABLET BY MOUTH DAILY FOR MOOD Patient not taking: No sig reported 11/21/20   [name], MD  cetirizine Alethia Berthold)  10 MG tablet Take 1 tablet (10 mg total) by mouth at bedtime. Patient not taking: No sig reported 11/16/16   [name], MD  famotidine (PEPCID) 40 MG tablet TAKE 1 TABLET BY MOUTH ONCE DAILY FOR INDIGESTION AND HEARTBURN Patient not taking: No sig reported 08/02/20   Garnet Sierras, NP  hydrochlorothiazide (HYDRODIURIL) 25 MG tablet TAKE 1 TABLET BY MOUTH ONCE DAILY FOR BLOOD PRESSURE AND FLUID RETENTION Patient not taking: No sig reported 02/05/20   [name], MD  olmesartan (BENICAR) 40 MG tablet TAKE 1 TABLET DAILY FOR BLOOD PRESSURE Patient not taking: No sig reported 02/12/21   [name], MD  ondansetron (ZOFRAN) 4 MG tablet Take 1 tablet (4 mg total) by mouth every 8 (eight) hours as needed for nausea or vomiting. Patient not taking: No sig reported 04/12/21   Liane Comber, NP  pantoprazole (PROTONIX) 40 MG tablet TAKE 1 TABLET DAILY FOR INDIGESTION & HEARTBURN Patient not taking: No sig reported 02/07/21   Liane Comber, NP  promethazine (PHENERGAN) 25 MG suppository Place 1 suppository (25 mg total) rectally  every 6 (six) hours as needed for nausea or vomiting. Patient not taking: No sig reported 04/12/21   Liane Comber, NP  rosuvastatin (CRESTOR) 40 MG tablet Take 1 tablet daily for Cholesterol Patient not taking: No sig reported 02/05/20   [name], MD     Family History  Problem Relation Age of Onset   Hypertension Mother    Breast cancer Mother    Aortic stenosis Father 44   Hypertension Other    Aortic stenosis Other    Breast cancer Other    Diabetes Other    Other Other        ASHD and epilepsy   Stroke Neg Hx    Colon cancer Neg Hx    Colon polyps Neg Hx    Esophageal cancer Neg Hx    Rectal cancer Neg Hx    Stomach cancer Neg Hx     Social History   Socioeconomic History   Marital status: Married    Spouse name: Not on file   Number of children: Not on file   Years of education: Not on file   Highest education level: Not on file  Occupational History   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 Determinants of Health   Financial Resource Strain: Not on file  Food Insecurity: Not on file  Transportation Needs: Not on file  Physical Activity: Not on file  Stress: Not on file  Social Connections: Not on file     Review of Systems: A 12 point ROS discussed and pertinent positives are indicated in the HPI above.  All other systems are negative.   Vital Signs: BP 119/71   Pulse 96   Temp 98.3 F (36.8 C) (Oral)   Resp (!) 28   Ht '5\' 11"'$  (1.803 m)  Wt 209 lb 10.5  oz (95.1 kg)   SpO2 95%   BMI 29.24 kg/m   Physical Exam Vitals reviewed.  Constitutional:      General: He is not in acute distress. HENT:     Head: Normocephalic and atraumatic.  Cardiovascular:     Rate and Rhythm: Normal rate and regular rhythm.  Pulmonary:     Effort: Pulmonary effort is normal.     Breath sounds: Normal breath sounds.  Abdominal:     General: Bowel sounds are normal.     Palpations: Abdomen is soft.  Skin:    General: Skin is warm and dry.     Coloration: Skin is not cyanotic or pale.  Neurological:     Mental Status: He is alert and oriented to person, place, and time.  Psychiatric:        Behavior: Behavior normal.     Comments: Appears mildly depressed    MD Evaluation Airway: WNL Heart: WNL Abdomen: WNL Chest/ Lungs: WNL ASA  Classification: 3 Mallampati/Airway Score: Two  Imaging: DG Ankle Complete Left  Result Date: 03/21/2021 CLINICAL DATA:  Acute left ankle pain and swelling without known injury. EXAM: LEFT ANKLE COMPLETE - 3+ VIEW COMPARISON:  None. FINDINGS: There is no evidence of fracture, dislocation, or joint effusion. There is no evidence of arthropathy or other focal bone abnormality. Soft tissues are unremarkable. IMPRESSION: Negative. Electronically Signed   By: Marijo Conception M.D.   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 EXAM: ABDOMEN - 1 VIEW COMPARISON:  CT abdomen pelvis dated 09/28/2020. FINDINGS: No bowel dilatation or evidence of obstruction. No free air or radiopaque calculi. Contrast noted within the urinary bladder. Degenerative changes of the spine. No acute osseous pathology. The soft tissues are grossly unremarkable. IMPRESSION: No bowel obstruction. Electronically Signed   By: Anner Crete M.D.   On: 03/23/2021 18:50   CT ABDOMEN PELVIS W CONTRAST  Result Date: 04/16/2021 CLINICAL DATA:  Abdominal pain EXAM: CT ABDOMEN AND PELVIS  WITH CONTRAST TECHNIQUE: Multidetector CT imaging of the abdomen and pelvis was performed using the standard protocol following bolus administration of intravenous contrast. CONTRAST:  126m OMNIPAQUE IOHEXOL 300 MG/ML  SOLN COMPARISON:  CT 09/28/2020. FINDINGS: Lower chest: Moderate left and trace right pleural effusions with adjacent areas of passive atelectasis including subtotal atelectatic collapse left lower lobe. Additional bandlike areas of more further subsegmental atelectasis in both lower lobes. Cardiac size is top normal. Calcification the coronary arteries. No pericardial effusion. Terminus of a central venous catheter seen at the superior cavoatrial junction. Distal thoracic aortic atherosclerosis. Hepatobiliary: No visible focal liver lesion. Smooth liver surface contour. Normal hepatic attenuation. Gallbladder appears surgically absent. No significant biliary ductal dilatation. Pancreas: No pancreatic ductal dilatation or surrounding inflammatory changes. Spleen: Appears surgically absent. Adrenals/Urinary Tract: No suspicious adrenal lesions.Bilateral perinephric stranding, nonspecific. Kidneys enhance symmetrically and uniformly. No concerning focal renal lesion. Bilateral extrarenal pelves. Circumferential thickening of the urinary bladder, possibly reactive. Stomach/Bowel: Distal esophagus proximal stomach unremarkable. Some questionable thickening towards the gastric antrum as well as a fluid-filled duodenal diverticulum at the level of the duodenal bulb. Surrounding inflammatory changes of the duodenum are nonspecific given the more diffuse inflammation throughout the bowel elsewhere. Much of the small bowel and colon, particularly those loops clustered in the low anterior mid abdomen appear diffusely thickened with some faint mucosal hyperenhancement best seen on narrow windowing. A complex  multiloculated fluid collection is seen insinuating throughout these clustered loops bowel which extends  to a more focal, loculated rim enhancing collection in the low anterior abdomen with the largest loculation measuring approximately 6.9 x 7.4 x 6.1 cm extending to the level of the skin surface with surrounding phlegmon in the adjacent subcutaneous tissues and along the anterior abdominal wall rectus sheath. Additional peritonitis features throughout the abdomen and pelvis. Suture line noted at the tip of the cecum compatible with postsurgical changes from prior surgical excision of a mucocele seen on comparison CT exam. No evidence of high-grade bowel obstruction. Diffusely fluid-filled appearance of the colon compatible with a rapid transit state. Vascular/Lymphatic: Extensive atherosclerotic plaque throughout the abdominal aorta and branch vessels. Reactive appearing mesenteric nodes are present Reproductive: Benign-appearing prostate calcifications. Some prostate seminal vesicles are otherwise unremarkable. Other: Complex rim enhancing collection insinuating through the low anterior abdomen and extending to the skin surface, as detailed above. Additional postsurgical changes along the anterior abdominal wall including a tract of gas and air in the right lower quadrant, could reflect an additional intracutaneous fistula along prior surgical tract, correlate with visual inspection. No bowel containing hernia. Musculoskeletal: Multilevel degenerative changes are present in the imaged portions of the spine. The osseous structures appear diffusely demineralized which may limit detection of small or nondisplaced fractures. Stable compression deformity T12 with up to 30% height loss anteriorly. Grade 1 anterolisthesis L4 on L5 without pars defects. No acute osseous abnormality or suspicious osseous lesion. Degenerative changes in the bilateral hips as well. IMPRESSION: Extensive postsurgical changes the abdomen with resection of a previously seen mucocele. 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 Appearance is consistent with abscess formation and cutaneous fistulization. Possible enterocutaneous fistula given the appearance the involved bowel loops. Additional tract punctate gas and fluid seen along the right lower quadrant, presumably at the site of port placement though patient's operative report is unavailable at the time of exam. Alternative site of cutaneous fistulization is not fully excluded given that the terminus of this collection extends to bowel loops in the right lower quadrant. Diffuse features of peritonitis, peritoneal seeding would be difficult to fully exclude given the appearance of the abdomen in the setting of mucocele resection. Additional features of anasarca/volume overload with bilateral pleural effusions and body wall edema. These results were called by telephone at the time of interpretation on 04/16/2021 at 1:50 am to provider [facility] , who verbally acknowledged these results. Electronically Signed   By: Lovena Le M.D.   On: 04/16/2021 01:50   DG Chest Port 1 View  Result Date: 04/15/2021 CLINICAL DATA:  Questionable sepsis - evaluate for abnormality EXAM: PORTABLE CHEST 1 VIEW COMPARISON:  04/05/2021 FINDINGS: Right PICC tip at the cavoatrial junction. Unchanged left basilar atelectasis/consolidation and small left pleural effusion. IMPRESSION: Unchanged left basilar atelectasis/consolidation and small left pleural effusion. Electronically Signed   By: Ulyses Jarred M.D.   On: 04/15/2021 23:11   DG CHEST PORT 1 VIEW  Result Date: 04/05/2021 CLINICAL DATA:  Fever EXAM: PORTABLE CHEST 1 VIEW COMPARISON:  Portable exam 1206 hours compared to 03/29/2021 FINDINGS: RIGHT arm PICC line tip projects over SVC. Mild enlargement of cardiac silhouette. Mediastinal contours  and pulmonary vascularity normal. Atelectasis versus consolidation LEFT lower lobe. Bronchitic changes with mild subsegmental atelectasis at  RIGHT lung base. Upper lungs clear. Probable small LEFT pleural effusion. No pneumothorax. Bones demineralized. IMPRESSION: Atelectasis versus consolidation LEFT lower lobe with small LEFT pleural effusion. Bronchitic changes with mild RIGHT basilar atelectasis. Mild enlargement of cardiac silhouette. Electronically Signed   By: Lavonia Dana M.D.   On: 04/05/2021 12:35   DG Chest Port 1 View  Result Date: 03/29/2021 CLINICAL DATA:  Respiratory distress EXAM: PORTABLE CHEST 1 VIEW COMPARISON:  03/21/2021 FINDINGS: Right-sided PICC line remains in place. Stable cardiomegaly. Increasing hazy left basilar opacities. Suspect trace left pleural effusion. No pneumothorax. IMPRESSION: Increasing hazy left basilar opacities may represent atelectasis versus pneumonia. Electronically Signed   By: Davina Poke D.O.   On: 03/29/2021 15:24   DG Chest Port 1 View  Result Date: 03/21/2021 CLINICAL DATA:  Cough. EXAM: PORTABLE CHEST 1 VIEW COMPARISON:  March 20, 2021. FINDINGS: Stable cardiomediastinal silhouette. No pneumothorax is noted. Right-sided PICC line is unchanged in position. Right lung is clear. Mild left basilar atelectasis is noted with small left pleural effusion. Bony thorax is unremarkable. IMPRESSION: Mild left basilar subsegmental atelectasis with small left pleural effusion. Electronically Signed   By: Marijo Conception M.D.   On: 03/21/2021 12:18   DG Chest Port 1 View  Result Date: 03/20/2021 CLINICAL DATA:  Central line placement EXAM: PORTABLE CHEST 1 VIEW COMPARISON:  October 12, 2017 FINDINGS: A new right PICC line is been placed, terminating centrally near the caval atrial junction. No pneumothorax. Probable small effusion with underlying opacity on the left. Increased interstitial markings in the lungs. The cardiomediastinal silhouette is stable.  IMPRESSION: 1. The new right PICC line is in good position terminating centrally near the caval atrial junction. No pneumothorax. 2. Probable small left effusion with underlying opacity, probably atelectasis. 3. Suggested pulmonary venous congestion/mild edema. Electronically Signed   By: Dorise Bullion III M.D   On: 03/20/2021 17:56    Labs:  CBC: Recent Labs    04/07/21 0604 04/08/21 0520 04/15/21 2252 04/16/21 0348 04/17/21 0121  WBC 8.2  --  48.7* 53.4* 23.8*  HGB 7.9* 7.6* 8.0* 8.0* 7.1*  HCT 26.8* 25.1* 26.2* 26.5* 24.3*  PLT 407*  --  454* 601* 552*    COAGS: Recent Labs    04/15/21 2252  INR 1.3*  APTT 36    BMP: Recent Labs    06/28/20 1038 07/08/20 1612 09/10/20 1043 10/08/20 1021 03/21/21 0734 04/10/21 0607 04/15/21 2252 04/16/21 0359 04/17/21 0121  NA 139 138  --  137   < > 132* 135 134* 136  K 4.2 4.2  --  3.5   < > 3.6 3.6 4.0 4.2  CL 105 102  --  101   < > 102 105 104 109  CO2 27 29  --  25   < > 25 20* 20* 20*  GLUCOSE 110* 127*  --  113*   < > 117* 100* 106* 137*  BUN 22 18   < > 21   < > '14 19 15 16  '$ CALCIUM 9.0 10.2  --  9.2   < > 8.8* 8.5* 8.7* 8.4*  CREATININE 1.14 1.13   < > 1.05   < > 0.76 0.85 0.83 0.70  GFRNONAA 63 64  --  70   < > >60 >60 >60 >60  GFRAA 74 74  --  81  --   --   --   --   --    < > = values in this interval  not displayed.    LIVER FUNCTION TESTS: Recent Labs    10/08/20 1021 03/21/21 0734 04/15/21 2252 04/17/21 0121  BILITOT 0.7 0.4 0.6 0.3  AST 26 27 41 26  ALT 31 38 41 27  ALKPHOS  --  142* 116 83  PROT 6.2 5.4* 5.1* 4.7*  ALBUMIN  --  1.7* 1.7* 1.5*    TUMOR MARKERS: No results for input(s): AFPTM, CEA, CA199, CHROMGRNA in the last 8760 hours.  Assessment and Plan: 75 y.o. male with history of extensive intra-abdominal surgery, postoperative course complicated by anastomotic leak, has been hospitalized multiple times since May 2022, patient was ultimately discharged to [facility] on March 20, 2021.  Patient was sent to [facility] ED from the [facility] with concerns for sepsis on 04/16/2021.  Patient was started on empiric antibiotic and admitted under the care of critical care.  CT abdomen pelvis on 04/16/2021 showed a intra-abdominal abscess with cutaneous fistula formation.  IR was requested for image guided aspiration and possible drain placement for the intra-abdominal fluid collection. Case was reviewed and approved for CT-guided aspiration and possible drain placement by Dr. Kathlene Cote.  The procedure is tentatively scheduled for tomorrow pending IR schedule. Made n.p.o. midnight Subcu heparin  6am on Monday  held - please contact IR tomorrow if 2 pm does needs to be held.  Patient was on aspirin 81 mg per chart, has not given during this admission since 04/15/2021 per chart VSS CBC WBC trending down 23.8 today, Hb 7.1, PLT 552 INR 1.3 on 04/15/2021  Risks and benefits discussed with the patient including bleeding, infection, damage to adjacent structures, bowel perforation/fistula connection, and sepsis.  Requested by patient to update his wife.  Mrs. Estee Ertel was called and updated.   All of the patient and his wife's questions were answered, both agreeable to proceed. Consent signed and in chart.   Thank you for this interesting consult.  I greatly enjoyed meeting Azaria Orlandini. and look forward to participating in their care.  A copy of this report was sent to the requesting provider on this date.  Electronically Signed: Tera Mater, PA-C 04/17/2021, 2:15 PM   I spent a total of 40 Minutes    in face to face in clinical consultation, greater than 50% of which was counseling/coordinating care for aspiration and possible drain placement for intra-abdominal fluid collection

## 2021-04-17 NOTE — Progress Notes (Signed)
Called [facility] transfer line -- patient is confirmed on waiting list for transfer, reportedly 2nd on list for transfer to respective unit.    Eliseo Gum MSN, AGACNP-BC [facility] 04/17/2021, 10:30 AM

## 2021-04-17 NOTE — Progress Notes (Signed)
Pt has ready bed at [place] [facility]. Report called to Teresita Madura, RN. Pt to go to room 917 with Dr. Clovis Riley as the accepting doctor.

## 2021-04-17 NOTE — Progress Notes (Signed)
Noticed no blood return from red port. Didn't hang TNA at this time. Tried to administer tPA, but patient is going to transfer to [facility]. Talked to patient's RN that ideally put in the tPA and pull out tPA before transferring to other hospital. Patient's RN will ask to MD regarding this matter. HS [employer]

## 2021-04-17 NOTE — Discharge Summary (Signed)
Physician Discharge Summary  Patient ID: Curtis Price. MRN: [id number] DOB/AGE: Apr 14, 1946 75 y.o.  Admit date: 04/15/2021 Discharge date: 04/17/2021  Admission Diagnoses:  Sepsis Enterocutaneous fistula  Abdominal wall abscess Cellulitis of abdominal wall  Acute peritonitis  GERD DNR status  Lactic acidosis  Discharge Diagnoses:  Principal Problem:   Sepsis ([place]) Active Problems:   GERD   Enterocutaneous fistula   Abdominal wall abscess   Acute peritonitis ([place])   DNR (do not resuscitate)   Atelectasis of left lung   Pleural effusion, left   Cellulitis of abdominal wall   Pressure injury of skin   Discharged Condition: stable  Hospital Course: 75 yo M PMH  most significant for mucinous adenocarcinoma of appendix s/p colectomy, splenectomy, omenectomy, cholecystectomy, colostomy, malnutrition requiring chronic TPN who presented to [facility] 7/22 with CC nausea and vomiting. This began 7/18, which was the day the patient was discharged from [facility]. Workup at [facility] concerning for sepsis, acute peritonitis. WBC 53 and Temp 103. Found to have abdominal abscess, anastomotic leaks and EC fistula.  Broadly started on vanc, cefepime, flagyl. Levophed was ordered for hypotension, however hemodynamics improved with fluid resuscitation and pressor support was not initiated. With medical complexity and borderline shock, the patient was ultimately admitted to ICU 7/23.  On 7/23 after admission, eraxis was added to antimicrobial coverage and patient received ongoing volume resuscitation. The patient was accepted in transfer to [facility] -- where the patient receives his oncology care and had his abdominal surgeries.  On 7/24, the patient remains on waitlist for transfer. His TPN is being restarted. General surgery has seen the patient and recommended IR consultation for possible drain placement for fluid collection if the patient is unable to transfer to [facility]. Plan to [facility] to [facility]  tonight   Consults: pulmonary/intensive care, general surgery, and interventional radiology  Significant Diagnostic Studies: radiology: CT scan: Left pleural effusion.   Complex air and fluid collections throughout multiple small bowel loops, enterocutaneous fistulization in lo/midline abdominal wall. Additional fistula in RLQ.   Treatments: IV hydration and antibiotics: vancomycin, metronidazole, cefepime, eraxis   Discharge Exam: Blood pressure 137/81, pulse 91, temperature 97.7 F (36.5 C), temperature source Oral, resp. rate 20, height '5\' 11"'$  (1.803 m), weight 95.1 kg, SpO2 95 %. On room air  General: in bed, NAD, appears comfortable   HEENT: MM pink/moist, anicteric, atraumatic Neuro: GCS 15, RASS 0, PERRL 38m CV: S1S2, NSR on monitor, no m/r/g appreciated PULM:  Clear in the upper lobes amd in the lower lobes, Trachea midline, chest expansion symmetric GI: soft, firm, tender, + BS Extremities: warm/dry, no pretibial edema, capillary refill less than 3 seconds  Skin: RLQ drain site wound with ostomy pouch, sub umbilical wound with ostomy bag. No rashes  Denies SOB, Chest pain  Disposition: Discharge disposition: [facility] Not Defined     Assessment & Plan:  Sepsis- suspect abdominal [facility] Per[facility]itis Enterocutaneous Fistula- as seen on 7/23 CT Abdominal abscess Lactic Acidosis-resolved Leukocytosis-  secondary to sepsis S/P right colectomy, splenectomy, omentectomy, cholecystectomy, and colostomy with anastomotic leaks and [facility] -Appears to have responded to fluids and does not need pressors -continue Cefepime, and flagyl.  Cultures negative so far, will DC vancomycin since MRSA PCR negative -added empiric anidulafungin [facility]   Acute respiratory failure with hypoxia L Pleural Effusion- as seen on 7/23 CT, suspect secondary  to malnutrition. Albumin 1.7. -Improved, on RA   Anemia, Normocytic-Suspect chronic due to malnutrition Normocytic Anemia Suspect HGB -Transfuse PRBC if HBG less than 7 -Obtain AM CBC to trend H&H -Monitor for signs of bleeding   Malnutrition, severe on TPN PTA Reported 40LB weight loss since April -TPN at [facility]   Nausea and vomiting -Continue NPO -Continue PRN zofran   HX GERD -Continue PPI   HX HTN -continue holding home antihypertensives at this time due to sepsis.   HX mucinous adenocarcinoma of the appendix -Transfer to [facility] when bed available. Per ED discussed with Dr. Clovis Riley of oncology. -Transfer to [facility]  Allergies as of 04/17/2021       Reactions   Atenolol Other (See Comments)   Unknown reaction   Atorvastatin Other (See Comments)   Unknown reaction   Darvocet [propoxyphene N-acetaminophen] Other (See Comments)   Unknown reaction   Paroxetine Other (See Comments)   Unknown reaction   Pregabalin Other (See Comments)   Unknown reaction   Propoxyphene Hcl Other (See Comments)   Unknown reaction        Medication List     STOP taking these medications    acetaminophen 325 MG tablet Commonly known as: TYLENOL   acetaminophen 650 MG suppository Commonly known as: TYLENOL   aspirin 81 MG chewable tablet   buPROPion 300 MG 24 hr tablet Commonly known as: WELLBUTRIN XL   buPROPion 75 MG tablet Commonly known as: WELLBUTRIN   cetirizine 10 MG tablet Commonly known as: ZYRTEC   cholecalciferol 25 MCG (1000 UNIT) tablet Commonly known as: VITAMIN D3   dronabinol 2.5 MG capsule Commonly known as: MARINOL   enoxaparin 40 MG/0.4ML injection Commonly known as: LOVENOX   famotidine 40 MG tablet Commonly known as: PEPCID   hydrochlorothiazide 25 MG tablet Commonly known as: HYDRODIURIL   lidocaine 5 % Commonly known as: LIDODERM   loratadine 10 MG tablet Commonly known as: CLARITIN   LORazepam 1 MG tablet Commonly known as: ATIVAN    losartan 50 MG tablet Commonly known as: COZAAR   melatonin 3 MG Tabs tablet   olmesartan 40 MG tablet Commonly known as: BENICAR   ondansetron 4 MG tablet Commonly known as: Zofran Replaced by: ondansetron 4 MG/2ML Soln injection   Ondansetron HCl 4 MG/2ML Sosy   piperacillin-tazobactam 3.375 (3-0.375) g injection Commonly known as: ZOSYN   promethazine 25 MG suppository Commonly known as: PHENERGAN   promethazine 25 MG/ML injection Commonly known as: PHENERGAN   rosuvastatin 40 MG tablet Commonly known as: Crestor   sertraline 50 MG tablet Commonly known as: ZOLOFT   simethicone 80 MG chewable tablet Commonly known as: MYLICON   SMOFlipid 20 % Emul infusion Generic drug: fat emul(SMOFlipid)   tamsulosin 0.4 MG Caps capsule Commonly known as: FLOMAX       TAKE these medications    anidulafungin in sodium chloride 0.9 % 100 mL anidulafungin (ERAXIS) 100 mg in sodium chloride 0.9 % 100 mL IVPB 100 mg, Intravenous, at 78 mL/hr, Every 24 hours, First dose on Sun 04/17/21 at 1200  Instructions:   Infusion rate should not exceed 1.1 mg/minute (84 mL/hr).   calcium carbonate 500 MG chewable tablet Commonly known as: TUMS - dosed in mg elemental calcium Chew 1 tablet (200 mg of elemental calcium total) by mouth every 4 (four) hours as needed for indigestion or heartburn.   ceFEPIme 2 g in sodium chloride 0.9 % 100 mL Inject 2 g into the vein every 8 (eight)  hours.   diclofenac Sodium 1 % Gel Commonly known as: VOLTAREN Apply 4 g topically 4 (four) times daily.   docusate sodium 100 MG capsule Commonly known as: COLACE Take 1 capsule (100 mg total) by mouth 2 (two) times daily as needed for mild constipation. What changed:  when to take this reasons to take this   famotidine 20-0.9 MG/50ML-% Commonly known as: PEPCID Inject 50 mLs (20 mg total) into the vein every 12 (twelve) hours.   heparin 5000 UNIT/ML injection Inject 1 mL (5,000 Units total) into  the skin every 8 (eight) hours. Start taking on: April 18, 2021   lactated ringers infusion Inject 75 mLs into the vein continuous.   metroNIDAZOLE 500 MG/100ML Commonly known as: FLAGYL Inject 100 mLs (500 mg total) into the vein every 12 (twelve) hours. Start taking on: April 18, 2021   mouth rinse Liqd solution 15 mLs by Mouth Rinse route 2 (two) times daily.   ondansetron 4 MG/2ML Soln injection Commonly known as: ZOFRAN Inject 2 mLs (4 mg total) into the vein every 6 (six) hours as needed for nausea or vomiting. Replaces: ondansetron 4 MG tablet   pantoprazole 40 MG injection Commonly known as: PROTONIX Inject 40 mg into the vein at bedtime. What changed:  when to take this Another medication with the same name was removed. Continue taking this medication, and follow the directions you see here.   polyethylene glycol 17 g packet Commonly known as: MIRALAX / GLYCOLAX Take 17 g by mouth daily as needed for moderate constipation. What changed:  reasons to take this additional instructions   vancomycin 1250 MG/250ML Soln Commonly known as: VANCOREADY Inject 250 mLs (1,250 mg total) into the vein every 12 (twelve) hours.        31 minutes of critical care time spent preparing for discharge  Signed: Redmond School., MSN, APRN, AGACNP-BC  Pulmonary & Critical Care  04/17/2021 , 7:50 PM  Please see [email or web address] for pager details  If no response, please call [phone] After hours, please call Elink at [phone]

## 2021-04-17 NOTE — Progress Notes (Signed)
Patient's RN called [facility] regarding when will be here. It was within 20 minutes from away, so no TPA at this time. [employer]

## 2021-04-18 LAB — GLUCOSE, CAPILLARY: Glucose-Capillary: 97 mg/dL (ref 70–99)

## 2021-04-19 LAB — PATHOLOGIST SMEAR REVIEW

## 2021-04-20 ENCOUNTER — Inpatient Hospital Stay: Payer: Medicare Other | Admitting: Adult Health

## 2021-04-21 LAB — CULTURE, BLOOD (SINGLE)
Culture: NO GROWTH
Special Requests: ADEQUATE

## 2021-05-04 ENCOUNTER — Telehealth: Payer: Self-pay

## 2021-05-04 NOTE — Telephone Encounter (Signed)
Patient's wife called to report nausea and fever of 101.4 and 101.7 consistently since this morning. The patient's wife reports that Kay's fever did not go down with fever reducing medication and went up instead to 101.7. She reports the patient has rigors and chills with the patient being placed under two blankets with no improvement. The patient's wife reports that she did not contact the patient's oncologist for advisement and called this office for advice. The patient's wife was advised to take the patient via car/ambulance for assessment to their local emergency department. The patient's wife voiced understanding with this staff member.

## 2021-05-17 ENCOUNTER — Telehealth: Payer: Self-pay | Admitting: Student

## 2021-05-17 NOTE — Telephone Encounter (Signed)
Spoke with patient's wife Lovey Newcomer and have scheduled a Palliative f/u visit (post hospital discharge) for 05/26/21 @ 12:30 PM.

## 2021-05-18 ENCOUNTER — Telehealth: Payer: Self-pay

## 2021-05-18 NOTE — Telephone Encounter (Signed)
I spoke to the patient's wife and informed her that Dr. Melford Aase approves Hospice and agrees to follow him. The patient's wife informed me that the patient has returned back to [facility] for evaluation and treatment starting last evening. The patient's wife was asked to update this office and to please call if we can assist the patient or her in any way.

## 2021-05-19 ENCOUNTER — Encounter: Payer: Medicare Other | Admitting: Internal Medicine

## 2021-05-26 ENCOUNTER — Other Ambulatory Visit: Payer: Medicare Other | Admitting: Student

## 2021-05-26 ENCOUNTER — Other Ambulatory Visit: Payer: Self-pay

## 2021-06-13 ENCOUNTER — Emergency Department (HOSPITAL_COMMUNITY): Payer: Medicare Other

## 2021-06-13 ENCOUNTER — Encounter (HOSPITAL_COMMUNITY): Payer: Self-pay

## 2021-06-13 ENCOUNTER — Inpatient Hospital Stay (HOSPITAL_COMMUNITY)
Admission: EM | Admit: 2021-06-13 | Discharge: 2021-06-14 | DRG: 314 | Disposition: A | Discharge status: Other Acute Inpatient Hospital | Payer: Medicare Other | Attending: Pulmonary Disease | Admitting: Pulmonary Disease

## 2021-06-13 DIAGNOSIS — A419 Sepsis, unspecified organism: Secondary | ICD-10-CM | POA: Diagnosis present

## 2021-06-13 DIAGNOSIS — Z792 Long term (current) use of antibiotics: Secondary | ICD-10-CM | POA: Diagnosis not present

## 2021-06-13 DIAGNOSIS — Z6826 Body mass index (BMI) 26.0-26.9, adult: Secondary | ICD-10-CM

## 2021-06-13 DIAGNOSIS — Z79899 Other long term (current) drug therapy: Secondary | ICD-10-CM | POA: Diagnosis not present

## 2021-06-13 DIAGNOSIS — Z803 Family history of malignant neoplasm of breast: Secondary | ICD-10-CM | POA: Diagnosis not present

## 2021-06-13 DIAGNOSIS — Z820 Family history of epilepsy and other diseases of the nervous system: Secondary | ICD-10-CM | POA: Diagnosis not present

## 2021-06-13 DIAGNOSIS — C181 Malignant neoplasm of appendix: Secondary | ICD-10-CM

## 2021-06-13 DIAGNOSIS — R000 Tachycardia, unspecified: Secondary | ICD-10-CM | POA: Diagnosis present

## 2021-06-13 DIAGNOSIS — Z833 Family history of diabetes mellitus: Secondary | ICD-10-CM | POA: Diagnosis not present

## 2021-06-13 DIAGNOSIS — K651 Peritoneal abscess: Secondary | ICD-10-CM | POA: Diagnosis present

## 2021-06-13 DIAGNOSIS — Z9081 Acquired absence of spleen: Secondary | ICD-10-CM | POA: Diagnosis not present

## 2021-06-13 DIAGNOSIS — T80211A Bloodstream infection due to central venous catheter, initial encounter: Principal | ICD-10-CM

## 2021-06-13 DIAGNOSIS — Z95828 Presence of other vascular implants and grafts: Secondary | ICD-10-CM | POA: Diagnosis not present

## 2021-06-13 DIAGNOSIS — B9561 Methicillin susceptible Staphylococcus aureus infection as the cause of diseases classified elsewhere: Secondary | ICD-10-CM | POA: Diagnosis not present

## 2021-06-13 DIAGNOSIS — A4101 Sepsis due to Methicillin susceptible Staphylococcus aureus: Secondary | ICD-10-CM | POA: Diagnosis present

## 2021-06-13 DIAGNOSIS — R7881 Bacteremia: Secondary | ICD-10-CM

## 2021-06-13 DIAGNOSIS — K219 Gastro-esophageal reflux disease without esophagitis: Secondary | ICD-10-CM | POA: Diagnosis present

## 2021-06-13 DIAGNOSIS — L02211 Cutaneous abscess of abdominal wall: Secondary | ICD-10-CM | POA: Diagnosis present

## 2021-06-13 DIAGNOSIS — T80219A Unspecified infection due to central venous catheter, initial encounter: Secondary | ICD-10-CM

## 2021-06-13 DIAGNOSIS — Z8249 Family history of ischemic heart disease and other diseases of the circulatory system: Secondary | ICD-10-CM

## 2021-06-13 DIAGNOSIS — K632 Fistula of intestine: Secondary | ICD-10-CM | POA: Diagnosis present

## 2021-06-13 DIAGNOSIS — E66811 Obesity, class 1: Secondary | ICD-10-CM | POA: Diagnosis present

## 2021-06-13 DIAGNOSIS — Z85038 Personal history of other malignant neoplasm of large intestine: Secondary | ICD-10-CM | POA: Diagnosis not present

## 2021-06-13 DIAGNOSIS — Z933 Colostomy status: Secondary | ICD-10-CM

## 2021-06-13 DIAGNOSIS — R6521 Severe sepsis with septic shock: Secondary | ICD-10-CM | POA: Diagnosis present

## 2021-06-13 DIAGNOSIS — Z66 Do not resuscitate: Secondary | ICD-10-CM | POA: Diagnosis present

## 2021-06-13 DIAGNOSIS — Y848 Other medical procedures as the cause of abnormal reaction of the patient, or of later complication, without mention of misadventure at the time of the procedure: Secondary | ICD-10-CM | POA: Diagnosis present

## 2021-06-13 DIAGNOSIS — Z20822 Contact with and (suspected) exposure to covid-19: Secondary | ICD-10-CM | POA: Diagnosis present

## 2021-06-13 DIAGNOSIS — E440 Moderate protein-calorie malnutrition: Secondary | ICD-10-CM | POA: Diagnosis present

## 2021-06-13 DIAGNOSIS — E669 Obesity, unspecified: Secondary | ICD-10-CM | POA: Diagnosis present

## 2021-06-13 DIAGNOSIS — Z9049 Acquired absence of other specified parts of digestive tract: Secondary | ICD-10-CM

## 2021-06-13 LAB — CBC WITH DIFFERENTIAL/PLATELET
Abs Immature Granulocytes: 0 10*3/uL (ref 0.00–0.07)
Basophils Absolute: 0 10*3/uL (ref 0.0–0.1)
Basophils Relative: 0 %
Eosinophils Absolute: 0.5 10*3/uL (ref 0.0–0.5)
Eosinophils Relative: 1 %
HCT: 30.9 % — ABNORMAL LOW (ref 39.0–52.0)
Hemoglobin: 9.5 g/dL — ABNORMAL LOW (ref 13.0–17.0)
Lymphocytes Relative: 1 %
Lymphs Abs: 0.5 10*3/uL — ABNORMAL LOW (ref 0.7–4.0)
MCH: 25.2 pg — ABNORMAL LOW (ref 26.0–34.0)
MCHC: 30.7 g/dL (ref 30.0–36.0)
MCV: 82 fL (ref 80.0–100.0)
Monocytes Absolute: 0 10*3/uL — ABNORMAL LOW (ref 0.1–1.0)
Monocytes Relative: 0 %
Neutro Abs: 46.6 10*3/uL — ABNORMAL HIGH (ref 1.7–7.7)
Neutrophils Relative %: 98 %
Platelets: 414 10*3/uL — ABNORMAL HIGH (ref 150–400)
RBC: 3.77 MIL/uL — ABNORMAL LOW (ref 4.22–5.81)
RDW: 23 % — ABNORMAL HIGH (ref 11.5–15.5)
WBC: 47.6 10*3/uL — ABNORMAL HIGH (ref 4.0–10.5)
nRBC: 0.2 % (ref 0.0–0.2)
nRBC: 1 /100 WBC — ABNORMAL HIGH

## 2021-06-13 LAB — COMPREHENSIVE METABOLIC PANEL
ALT: 35 U/L (ref 0–44)
AST: 56 U/L — ABNORMAL HIGH (ref 15–41)
Albumin: 2.2 g/dL — ABNORMAL LOW (ref 3.5–5.0)
Alkaline Phosphatase: 143 U/L — ABNORMAL HIGH (ref 38–126)
Anion gap: 10 (ref 5–15)
BUN: 22 mg/dL (ref 8–23)
CO2: 21 mmol/L — ABNORMAL LOW (ref 22–32)
Calcium: 8.5 mg/dL — ABNORMAL LOW (ref 8.9–10.3)
Chloride: 103 mmol/L (ref 98–111)
Creatinine, Ser: 0.89 mg/dL (ref 0.61–1.24)
GFR, Estimated: >60 mL/min (ref >60)
Glucose, Bld: 92 mg/dL (ref 70–99)
Potassium: 4.2 mmol/L (ref 3.5–5.1)
Sodium: 134 mmol/L — ABNORMAL LOW (ref 135–145)
Total Bilirubin: 0.9 mg/dL (ref 0.3–1.2)
Total Protein: 6.9 g/dL (ref 6.5–8.1)

## 2021-06-13 LAB — RESP PANEL BY RT-PCR (FLU A&B, COVID) ARPGX2
Influenza A by PCR: NEGATIVE
Influenza B by PCR: NEGATIVE
SARS Coronavirus 2 by RT PCR: NEGATIVE

## 2021-06-13 LAB — URINALYSIS, ROUTINE W REFLEX MICROSCOPIC
Bilirubin Urine: NEGATIVE
Glucose, UA: NEGATIVE mg/dL
Hgb urine dipstick: NEGATIVE
Ketones, ur: NEGATIVE mg/dL
Leukocytes,Ua: NEGATIVE
Nitrite: NEGATIVE
Protein, ur: NEGATIVE mg/dL
Specific Gravity, Urine: 1.01 (ref 1.005–1.030)
pH: 5.5 (ref 5.0–8.0)

## 2021-06-13 LAB — LACTIC ACID, PLASMA
Lactic Acid, Venous: 2.3 mmol/L (ref 0.5–1.9)
Lactic Acid, Venous: 2.8 mmol/L (ref 0.5–1.9)

## 2021-06-13 LAB — PROTIME-INR
INR: 1.2 (ref 0.8–1.2)
Prothrombin Time: 15 seconds (ref 11.4–15.2)

## 2021-06-13 LAB — APTT: aPTT: 38 seconds — ABNORMAL HIGH (ref 24–36)

## 2021-06-13 MED ORDER — CHLORHEXIDINE GLUCONATE CLOTH 2 % EX PADS
6.0000 | MEDICATED_PAD | Freq: Every day | CUTANEOUS | Status: DC
  Administered 2021-06-14: 6 via TOPICAL

## 2021-06-13 MED ORDER — IOHEXOL 350 MG/ML SOLN
75.0000 mL | Freq: Once | INTRAVENOUS | Status: AC | PRN
  Administered 2021-06-13: 75 mL via INTRAVENOUS

## 2021-06-13 MED ORDER — SODIUM CHLORIDE 0.9 % IV SOLN
2.0000 g | Freq: Once | INTRAVENOUS | Status: AC
  Administered 2021-06-13: 2 g via INTRAVENOUS
  Filled 2021-06-13: qty 2

## 2021-06-13 MED ORDER — DOCUSATE SODIUM 100 MG PO CAPS: 100.0000 mg | ORAL_CAPSULE | Freq: Two times a day (BID) | ORAL | Status: DC | PRN

## 2021-06-13 MED ORDER — LACTATED RINGERS IV BOLUS (SEPSIS)
1000.0000 mL | Freq: Once | INTRAVENOUS | Status: AC
  Administered 2021-06-13: 1000 mL via INTRAVENOUS

## 2021-06-13 MED ORDER — VANCOMYCIN HCL 2000 MG/400ML IV SOLN: 2000.0000 mg | INTRAVENOUS | Status: DC

## 2021-06-13 MED ORDER — SODIUM CHLORIDE 0.9 % IV SOLN
2.0000 g | Freq: Three times a day (TID) | INTRAVENOUS | Status: DC
  Administered 2021-06-13: 2 g via INTRAVENOUS
  Filled 2021-06-13: qty 2

## 2021-06-13 MED ORDER — SODIUM CHLORIDE 0.9% FLUSH: 10.0000 mL | INTRAVENOUS | Status: DC | PRN

## 2021-06-13 MED ORDER — ALTEPLASE 2 MG IJ SOLR
2.0000 mg | Freq: Once | INTRAMUSCULAR | Status: AC
  Administered 2021-06-13: 2 mg
  Filled 2021-06-13: qty 2

## 2021-06-13 MED ORDER — SODIUM CHLORIDE 0.9 % IV SOLN: 250.0000 mL | INTRAVENOUS | Status: DC

## 2021-06-13 MED ORDER — ONDANSETRON HCL 4 MG/2ML IJ SOLN: 4.0000 mg | Freq: Four times a day (QID) | INTRAMUSCULAR | Status: DC | PRN

## 2021-06-13 MED ORDER — LACTATED RINGERS IV SOLN: INTRAVENOUS | Status: AC

## 2021-06-13 MED ORDER — ACETAMINOPHEN 325 MG PO TABS: 650.0000 mg | ORAL_TABLET | ORAL | Status: DC | PRN

## 2021-06-13 MED ORDER — METRONIDAZOLE 500 MG/100ML IV SOLN
500.0000 mg | Freq: Once | INTRAVENOUS | Status: AC
  Administered 2021-06-13: 500 mg via INTRAVENOUS
  Filled 2021-06-13: qty 100

## 2021-06-13 MED ORDER — SODIUM CHLORIDE 0.9 % IV BOLUS
1000.0000 mL | Freq: Once | INTRAVENOUS | Status: AC
  Administered 2021-06-14: 1000 mL via INTRAVENOUS

## 2021-06-13 MED ORDER — ACETAMINOPHEN 500 MG PO TABS
1000.0000 mg | ORAL_TABLET | Freq: Once | ORAL | Status: DC
  Filled 2021-06-13: qty 2

## 2021-06-13 MED ORDER — CALCIUM CARBONATE ANTACID 500 MG PO CHEW
1.0000 | CHEWABLE_TABLET | Freq: Once | ORAL | Status: DC
Start: 2021-06-13 — End: 2021-06-15

## 2021-06-13 MED ORDER — ONDANSETRON HCL 4 MG/2ML IJ SOLN
4.0000 mg | Freq: Once | INTRAMUSCULAR | Status: AC
  Administered 2021-06-13: 4 mg via INTRAVENOUS
  Filled 2021-06-13: qty 2

## 2021-06-13 MED ORDER — ACETAMINOPHEN 160 MG/5ML PO SOLN
1000.0000 mg | Freq: Once | ORAL | Status: AC
  Administered 2021-06-13: 1000 mg via ORAL
  Filled 2021-06-13: qty 40.6

## 2021-06-13 MED ORDER — NOREPINEPHRINE 4 MG/250ML-% IV SOLN: 0.0000 ug/min | INTRAVENOUS | Status: DC

## 2021-06-13 MED ORDER — FAMOTIDINE IN NACL 20-0.9 MG/50ML-% IV SOLN
20.0000 mg | Freq: Two times a day (BID) | INTRAVENOUS | Status: DC
  Filled 2021-06-13: qty 50

## 2021-06-13 MED ORDER — POLYETHYLENE GLYCOL 3350 17 G PO PACK: 17.0000 g | PACK | Freq: Every day | ORAL | Status: DC | PRN

## 2021-06-13 MED ORDER — HEPARIN SODIUM (PORCINE) 5000 UNIT/ML IJ SOLN
5000.0000 [IU] | Freq: Three times a day (TID) | INTRAMUSCULAR | Status: DC
  Administered 2021-06-14 (×2): 5000 [IU] via SUBCUTANEOUS
  Filled 2021-06-13 (×2): qty 1

## 2021-06-13 MED ORDER — ALTEPLASE 2 MG IJ SOLR
2.0000 mg | Freq: Once | INTRAMUSCULAR | Status: DC
  Filled 2021-06-13: qty 2

## 2021-06-13 MED ORDER — VANCOMYCIN HCL 2000 MG/400ML IV SOLN
2000.0000 mg | Freq: Once | INTRAVENOUS | Status: AC
  Administered 2021-06-13: 2000 mg via INTRAVENOUS
  Filled 2021-06-13: qty 400

## 2021-06-13 NOTE — ED Provider Notes (Addendum)
[facility] EMERGENCY DEPARTMENT Provider Note   CSN: [id number] Arrival date & time: 06/13/21  1240     History Chief Complaint  Patient presents with   Fever    Curtis Price. is a 75 y.o. male.  HPI Patient has complicated medical history. With h/o  mucinous adenocarcinoma of appendix s/p colectomy, splenectomy, omenectomy, cholecystectomy, colostomy, malnutrition requiring chronic TPN.  July 2022, patient was found to have abdominal abscesses with anastomotic leaks and fistula.  Patient was started on broad-spectrum antibiotics including vancomycin and cefepime and Flagyl.  Patient was transferred to tertiary care [facility] for ongoing management.  Dors is fever at home.  He started feeling ill yesterday.  He reports he feels very generally weak.  He denies he is having a significant amount of pain.  Home health checked today found the patient to be febrile and tachycardic.  Fever did not respond to acetaminophen.  Patient was referred to the hospital.  He reports he feels terrible generally.    Past Medical History:  Diagnosis Date   Allergy    BPH (benign prostatic hypertrophy)    Elevated serum immunoglobulin free light chains 04/28/2014   Erythrocytosis 04/28/2014   Exertional dyspnea    ETT myoview 5/11: 8 min, no ischemic ECG changes, stopped due to fatigue/ SOB. EF 60%, no ischemia or infarction. Echo 5/11 EF 60%, no significant valvular abnormalities, mild RV dilation and dysfunction, no complete TR doppler jet so PA pressure hard to gauge. 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 R heart cath 6/11: mean RA 10 mmHg, PA 25/18, mean PCWP 13 mmHg, CI 2.1   Fibromyalgia    GERD (gastroesophageal reflux disease)    History of epididymitis    History of head injury without skull fracture 1980   Concussion   Hx of adenomatous polyp of colon 06/19/2007   Hyperlipidemia    Myalgias with Lipitor   Hypertension    Hypogonadism male     Myofascial pain syndrome    Nephrolithiasis    Prolapsed internal hemorrhoids    Prostatitis    Recurrent   Rheumatic fever    x3 at ages [age] and 34; No significant valvular abnormality on 5/11 echo   Tendinitis 2000   Hx of left bicep   Vitamin D deficiency     Patient Active Problem List   Diagnosis Date Noted   Sepsis ([place]) 04/16/2021   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   Abdominal wall abscess 04/16/2021   Acute peritonitis ([place]) 04/16/2021   DNR (do not resuscitate) 04/16/2021   Atelectasis of left lung 04/16/2021   Pleural effusion, left 04/16/2021   Cellulitis of abdominal wall 04/16/2021   Pressure injury of skin 04/16/2021   Anemia    Acute respiratory failure with hypoxia (HCC)    Abnormality of colon-appendix on colonoscopy 09/10/2020   Polycythemia 05/29/2019   Dysthymia 05/28/2019   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   Obesity (BMI 30.0-34.9) 09/05/2015   Abnormal glucose 08/31/2015   Erectile dysfunction 08/31/2015   Elevated serum immunoglobulin free light chains 04/28/2014   Medication management 11/19/2013   Benign prostatic hyperplasia    GERD    Fibromyalgia    Testosterone Deficiency    Hyperlipidemia, mixed    Vitamin D deficiency    Essential hypertension 02/15/2010   Hx of adenomatous polyp of colon 06/19/2007    Past Surgical History:  Procedure Laterality Date   BIH REPAIR  08/2005   Incarverated fat by mesh  repair   CARDIAC CATHETERIZATION  02/2010   Left and right   COLONOSCOPY  06/09/2015   [facility] SURGERY  2000   Repair of injury to right fifth finger   HEEL SPUR SURGERY  1960   Bilateral   TONSILLECTOMY  1962       Family History  Problem Relation Age of Onset   Hypertension Mother    Breast cancer Mother    Aortic stenosis Father 59   Hypertension Other    Aortic stenosis Other    Breast cancer Other    Diabetes Other    Other Other        ASHD and epilepsy   Stroke Neg Hx    Colon cancer Neg Hx    Colon  polyps Neg Hx    Esophageal cancer Neg Hx    Rectal cancer Neg Hx    Stomach cancer Neg Hx     Social History   Tobacco Use   Smoking status: Never   Smokeless tobacco: Never  Vaping Use   Vaping Use: Never used  Substance Use Topics   Alcohol use: Yes    Alcohol/week: 12.0 standard drinks    Types: 12 Cans of beer per week    Comment: beer and liquoer   Drug use: No    Home Medications Prior to Admission medications   Medication Sig Start Date End Date Taking? Authorizing Provider  buPROPion (WELLBUTRIN XL) 300 MG 24 hr tablet Take 300 mg by mouth daily.   Yes [provider]  ciprofloxacin (CIPRO) 500 MG tablet Take 500 mg by mouth 2 (two) times daily. 06/06/21  Yes [provider]  furosemide (LASIX) 20 MG tablet Take 20 mg by mouth daily. 06/06/21  Yes [provider]  heparin 5000 UNIT/ML injection Inject 1 mL (5,000 Units total) into the skin every 8 (eight) hours. 04/18/21  Yes Estill Cotta, NP  metroNIDAZOLE (FLAGYL) 500 MG tablet Take 500 mg by mouth every 6 (six) hours. 06/06/21  Yes [provider]  sertraline (ZOLOFT) 100 MG tablet Take 100 mg by mouth at bedtime.   Yes [provider]  anidulafungin in sodium chloride 0.9 % 100 mL anidulafungin (ERAXIS) 100 mg in sodium chloride 0.9 % 100 mL IVPB 100 mg, Intravenous, at 78 mL/hr, Every 24 hours, First dose on Sun 04/17/21 at 1200  Instructions:   Infusion rate should not exceed 1.1 mg/minute (84 mL/hr). Patient not taking: No sig reported 04/17/21   Estill Cotta, NP  calcium carbonate (TUMS - DOSED IN MG ELEMENTAL CALCIUM) 500 MG chewable tablet Chew 1 tablet (200 mg of elemental calcium total) by mouth every 4 (four) hours as needed for indigestion or heartburn. Patient not taking: No sig reported 04/17/21   Estill Cotta, NP  ceFEPIme 2 g in sodium chloride 0.9 % 100 mL Inject 2 g into the vein every 8 (eight) hours. Patient not taking: No sig reported 04/17/21    Estill Cotta, NP  diclofenac Sodium (VOLTAREN) 1 % GEL Apply 4 g topically 4 (four) times daily. Patient not taking: No sig reported 04/17/21   Estill Cotta, NP  docusate sodium (COLACE) 100 MG capsule Take 1 capsule (100 mg total) by mouth 2 (two) times daily as needed for mild constipation. Patient not taking: No sig reported 04/17/21   Estill Cotta, NP  famotidine (PEPCID) 20-0.9 MG/50ML-% Inject 50 mLs (20 mg total) into the vein every 12 (twelve) hours. Patient  not taking: No sig reported 04/17/21   Estill Cotta, NP  lactated ringers infusion Inject 75 mLs into the vein continuous. 04/17/21   Estill Cotta, NP  metroNIDAZOLE (FLAGYL) 500 MG/100ML Inject 100 mLs (500 mg total) into the vein every 12 (twelve) hours. Patient not taking: No sig reported 04/18/21   Estill Cotta, NP  Mouthwashes (MOUTH RINSE) LIQD solution 15 mLs by Mouth Rinse route 2 (two) times daily. Patient not taking: No sig reported 04/17/21   Estill Cotta, NP  ondansetron [facility]) 4 MG/2ML SOLN injection Inject 2 mLs (4 mg total) into the vein every 6 (six) hours as needed for nausea or vomiting. Patient not taking: No sig reported 04/17/21   Estill Cotta, NP  pantoprazole (PROTONIX) 40 MG injection Inject 40 mg into the vein at bedtime. Patient not taking: No sig reported 04/17/21   Estill Cotta, NP  polyethylene glycol (MIRALAX / GLYCOLAX) 17 g packet Take 17 g by mouth daily as needed for moderate constipation. Patient not taking: No sig reported 04/17/21   Estill Cotta, NP  vancomycin (VANCOREADY) 1250 MG/250ML SOLN Inject 250 mLs (1,250 mg total) into the vein every 12 (twelve) hours. Patient not taking: No sig reported 04/17/21   Estill Cotta, NP    Allergies    Atenolol, Atorvastatin, Darvocet [propoxyphene n-acetaminophen], Paroxetine, Pregabalin, and Propoxyphene hcl  Review of Systems   Review of Systems 10 systems reviewed and negative except as per  HPI Physical Exam Updated Vital Signs BP 126/67   Pulse (!) 122   Temp 99.5 F (37.5 C) (Oral)   Resp (!) 25   SpO2 97%   Physical Exam Constitutional:      Comments: Patient is alert.  He is however ill in appearance.  Tachypnea but no respiratory distress.  HENT:     Mouth/Throat:     Pharynx: Oropharynx is clear.  Eyes:     Extraocular Movements: Extraocular movements intact.  Cardiovascular:     Comments: Tachycardia.  No gross rub murmur gallop. Pulmonary:     Comments: Tachypnea.  Lungs grossly clear. Abdominal:     Comments: Colostomy and suprapubic position and erythematous area of indurated skin in the lower abdominal wall about 1.5 cm.  Patient generally denies discomfort to palpation.  Musculoskeletal:        General: No swelling or tenderness. Normal range of motion.     Right lower leg: No edema.     Left lower leg: No edema.  Skin:    General: Skin is warm and dry.  Neurological:     General: No focal deficit present.     Mental Status: He is oriented to person, place, and time.     Coordination: Coordination normal.    ED Results / Procedures / Treatments   Labs (all labs ordered are listed, but only abnormal results are displayed) Labs Reviewed  COMPREHENSIVE METABOLIC PANEL - Abnormal; Notable for the following components:      Result Value   Sodium 134 (*)    CO2 21 (*)    Calcium 8.5 (*)    Albumin 2.2 (*)    AST 56 (*)    Alkaline Phosphatase 143 (*)    All other components within normal limits  LACTIC ACID, PLASMA - Abnormal; Notable for the following components:   Lactic Acid, Venous 2.3 (*)    All other components within normal limits  CBC WITH DIFFERENTIAL/PLATELET - Abnormal; Notable for the following components:  WBC 47.6 (*)    RBC 3.77 (*)    Hemoglobin 9.5 (*)    HCT 30.9 (*)    MCH 25.2 (*)    RDW 23.0 (*)    Platelets 414 (*)    Neutro Abs 46.6 (*)    Lymphs Abs 0.5 (*)    Monocytes Absolute 0.0 (*)    nRBC 1 (*)    All  other components within normal limits  CULTURE, BLOOD (ROUTINE X 2)  CULTURE, BLOOD (ROUTINE X 2)  RESP PANEL BY RT-PCR (FLU A&B, COVID) ARPGX2  URINE CULTURE  PROTIME-INR  LACTIC ACID, PLASMA  URINALYSIS, ROUTINE W REFLEX MICROSCOPIC  APTT  MISCELLANEOUS GENETIC TEST    EKG EKG Interpretation  Date/Time:  Monday June 13 2021 12:48:21 EDT Ventricular Rate:  135 PR Interval:  114 QRS Duration: 92 QT Interval:  298 QTC Calculation: 447 R Axis:   -58 Text Interpretation: Sinus tachycardia Atrial premature complex no sig change from previous Confirmed by Charlesetta Shanks [phone]) on 06/13/2021 2:26:40 PM  Radiology DG Chest 2 View  Result Date: 06/13/2021 CLINICAL DATA:  Suspected sepsis. EXAM: CHEST - 2 VIEW COMPARISON:  April 15, 2021. FINDINGS: LEFT-sided PICC line terminates at the caval to atrial junction. EKG leads project over the chest. Cardiomediastinal contours accentuated by portable technique likely mildly enlarged but unchanged. Lung volumes are slightly improved compared to the prior study. LEFT hemidiaphragm is obscured with increased density in the retrocardiac region though mildly improved compared to the previous study. Lateral view with small layering effusions. On limited assessment there is no acute skeletal process. IMPRESSION: Improved lung volumes with persistent LEFT basilar airspace disease. Small layering effusions. Electronically Signed   By: Zetta Bills M.D.   On: 06/13/2021 14:04   DG Chest Port 1 View  Result Date: 06/13/2021 CLINICAL DATA:  Confusion, sepsis EXAM: PORTABLE CHEST 1 VIEW COMPARISON:  Portable exam 1410 hours compared to 06/13/2021 FINDINGS: LEFT arm PICC line tip projects over SVC. Mild enlargement of cardiac silhouette. Mediastinal contours and pulmonary vascularity normal. Atherosclerotic calcification aorta and at the carotid bifurcations bilaterally. Bibasilar atelectasis and small LEFT pleural effusion again seen. Remaining lungs  clear. No pneumothorax or acute osseous findings. Bones demineralized. IMPRESSION: Bibasilar atelectasis and small LEFT pleural effusion. Enlargement of cardiac silhouette. Scattered atherosclerotic calcifications including carotid bifurcations. Aortic Atherosclerosis (ICD10-I70.0). Electronically Signed   By: Lavonia Dana M.D.   On: 06/13/2021 14:47    Procedures Procedures  CRITICAL CARE Performed by: Charlesetta Shanks   Total critical care time: 30 minutes  Critical care time was exclusive of separately billable procedures and treating other patients.  Critical care was necessary to treat or prevent imminent or life-threatening deterioration.  Critical care was time spent personally by me on the following activities: development of treatment plan with patient and/or surrogate as well as nursing, discussions with consultants, evaluation of patient's response to treatment, examination of patient, obtaining history from patient or surrogate, ordering and performing treatments and interventions, ordering and review of laboratory studies, ordering and review of radiographic studies, pulse oximetry and re-evaluation of patient's condition.  Medications Ordered in ED Medications  lactated ringers infusion (has no administration in time range)  vancomycin (VANCOREADY) IVPB 2000 mg/400 mL (2,000 mg Intravenous New Bag/Given 06/13/21 1602)  alteplase (CATHFLO ACTIVASE) injection 2 mg (has no administration in time range)  acetaminophen (TYLENOL) 160 MG/5ML solution 1,000 mg (has no administration in time range)  lactated ringers bolus 1,000 mL (1,000 mLs Intravenous New Bag/Given 06/13/21  1530)    And  lactated ringers bolus 1,000 mL (0 mLs Intravenous Stopped 06/13/21 1530)    And  lactated ringers bolus 1,000 mL (1,000 mLs Intravenous New Bag/Given 06/13/21 1559)  ceFEPIme (MAXIPIME) 2 g in sodium chloride 0.9 % 100 mL IVPB (0 g Intravenous Stopped 06/13/21 1509)  metroNIDAZOLE (FLAGYL) IVPB 500 mg (500  mg Intravenous New Bag/Given 06/13/21 1534)  alteplase (CATHFLO ACTIVASE) injection 2 mg (2 mg Intracatheter Given 06/13/21 1426)    ED Course  I have reviewed the triage vital signs and the nursing notes.  Pertinent labs & imaging results that were available during my care of the patient were reviewed by me and considered in my medical decision making (see chart for details).    MDM Rules/Calculators/A&P                           Patient presents with septic presentation.  He is febrile with tachycardia up to 130s.  Patient endorses generalized symptoms of significant malaise nausea and weakness.  Patient has history significant for sepsis with complicated intra-abdominal abscess process.  At this time will initiate sepsis treatment with fluid resuscitation, broad-spectrum antibiotics and anticipated admission.  At this time patient does not have hypotension.  He however has tachycardia, high fever and white count. Final Clinical Impression(s) / ED Diagnoses Final diagnoses:  Sepsis, due to unspecified organism, unspecified whether acute organ dysfunction present [facility])    Rx / DC Orders ED Discharge Orders     None        Charlesetta Shanks, MD 06/13/21 [place], MD 06/17/21 [id number]

## 2021-06-13 NOTE — ED Notes (Signed)
Patient transported to CT 

## 2021-06-13 NOTE — H&P (Addendum)
NAME:  Curtis Price. MRN:  [id number] DOB:  [birth year] LOS: 0 ADMISSION DATE:  06/13/2021 DATE OF SERVICE:  06/13/2021  CHIEF COMPLAINT:  hypotension   HISTORY & PHYSICAL  History of Present Illness  This 75 y.o. Caucasian male presented to the [facility] Emergency Department via EMS with complaints of fever. He started feeling ill yesterday and reportedly was febrile, generally weak.  Home health check today found the patient to be febrile and tachycardic, without improveement after acetaminophen.  He was advised to [facility] to ER..  In the ER, the patinet has been started on cefepime/Flagylvancomycin to empirically cover for abdominal sepsis.  He received 3 L LR IV bolus followed by infusion at 150 mL/hr.  His blood pressure has improved.  He has a past history of mucinous adenocarcinoma of appendix s/p colectomy, splenectomy, omentectomy, cholecystectomy, colostomy.  He subsequently developed malnutrition, requiring chronic TPN.  He has a known enterocutaneous fistula.  Also, he has a L PICC line in his LUE (unknown placement date at this time).  He was admitted to this facility in 04/15/2021, which resulted in transfer to [facility] on 04/17/2021.  He has had 2 subsequent admissions at [facility] in August, all problems related to infectious complications of his enterocutaneous fistula.  REVIEW OF SYSTEMS Constitutional: Fever, malaise. Generalized weakness.  No weight loss. No night sweats. No chills.  HEENT: No headaches, dysphagia, sore throat, otalgia, nasal congestion, PND CV:  No chest pain, orthopnea, PND, swelling in lower extremities, palpitations GI:  Enterocutaneous fistula with feculent drainage.  No abdominal pain, nausea, vomiting, diarrhea, change in bowel pattern, anorexia Resp: No DOE, rest dyspnea, cough, mucus, hemoptysis, wheezing  GU: no dysuria, change in color of urine, no urgency or frequency.  No flank pain. MS:  No joint pain or swelling. No  myalgias,  No decreased range of motion.  Psych:  No change in mood or affect. No memory loss. Skin: no rash or lesions.   Past Medical/Surgical/Social/Family History   Past Medical History:  Diagnosis Date   Allergy    BPH (benign prostatic hypertrophy)    Elevated serum immunoglobulin free light chains 04/28/2014   Erythrocytosis 04/28/2014   Exertional dyspnea    ETT myoview 5/11: 8 min, no ischemic ECG changes, stopped due to fatigue/ SOB. EF 60%, no ischemia or infarction. Echo 5/11 EF 60%, no significant valvular abnormalities, mild RV dilation and dysfunction, no complete TR doppler jet so PA pressure hard to gauge. 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 R heart cath 6/11: mean RA 10 mmHg, PA 25/18, mean PCWP 13 mmHg, CI 2.1   Fibromyalgia    GERD (gastroesophageal reflux disease)    History of epididymitis    History of head injury without skull fracture 1980   Concussion   Hx of adenomatous polyp of colon 06/19/2007   Hyperlipidemia    Myalgias with Lipitor   Hypertension    Hypogonadism male    Myofascial pain syndrome    Nephrolithiasis    Prolapsed internal hemorrhoids    Prostatitis    Recurrent   Rheumatic fever    x3 at ages [age] and 76; No significant valvular abnormality on 5/11 echo   Tendinitis 2000   Hx of left bicep   Vitamin D deficiency     Past Surgical History:  Procedure Laterality Date   BIH REPAIR  08/2005   Incarverated fat by mesh repair   CARDIAC CATHETERIZATION  02/2010   Left and right   COLONOSCOPY  06/09/2015   [facility] SURGERY  2000   Repair of injury to right fifth finger   HEEL SPUR SURGERY  1960   Bilateral   TONSILLECTOMY  1962    Social History   Tobacco Use   Smoking status: Never   Smokeless tobacco: Never  Substance Use Topics   Alcohol use: Yes    Alcohol/week: 12.0 standard drinks    Types: 12 Cans of beer per week    Comment: beer and liquoer    Family History  Problem  Relation Age of Onset   Hypertension Mother    Breast cancer Mother    Aortic stenosis Father 61   Hypertension Other    Aortic stenosis Other    Breast cancer Other    Diabetes Other    Other Other        ASHD and epilepsy   Stroke Neg Hx    Colon cancer Neg Hx    Colon polyps Neg Hx    Esophageal cancer Neg Hx    Rectal cancer Neg Hx    Stomach cancer Neg Hx      Procedures:     Significant Diagnostic Tests:     Micro Data:   Results for orders placed or performed during the hospital encounter of 06/13/21  Resp Panel by RT-PCR (Flu A&B, Covid) Nasopharyngeal Swab     Status: None   Collection Time: 06/13/21  9:30 PM   Specimen: Nasopharyngeal Swab; Nasopharyngeal(NP) swabs in vial transport medium  Result Value Ref Range Status   SARS Coronavirus 2 by RT PCR NEGATIVE NEGATIVE Final    Comment: (NOTE) SARS-CoV-2 target nucleic acids are NOT DETECTED.  The SARS-CoV-2 RNA is generally detectable in upper respiratory specimens during the acute phase of infection. The lowest concentration of SARS-CoV-2 viral copies this assay can detect is 138 copies/mL. A negative result does not preclude SARS-Cov-2 infection and should not be used as the sole basis for treatment or other patient management decisions. A negative result may occur with  improper specimen collection/handling, submission of specimen other than nasopharyngeal swab, presence of viral mutation(s) within the areas targeted by this assay, and inadequate number of viral copies(<138 copies/mL). A negative result must be combined with clinical observations, patient history, and epidemiological information. The expected result is Negative.  Fact Sheet for Patients:  EntrepreneurPulse.com.au  Fact Sheet for Healthcare Providers:  IncredibleEmployment.be  This test is no t yet approved or cleared by the [place] FDA and  has been authorized for detection and/or  diagnosis of SARS-CoV-2 by FDA under an Emergency Use Authorization (EUA). This EUA will remain  in effect (meaning this test can be used) for the duration of the COVID-19 declaration under Section 564(b)(1) of the Act, 21 U.S.C.section 360bbb-3(b)(1), unless the authorization is terminated  or revoked sooner.       Influenza A by PCR NEGATIVE NEGATIVE Final   Influenza B by PCR NEGATIVE NEGATIVE Final    Comment: (NOTE) The Xpert Xpress SARS-CoV-2/FLU/RSV plus assay is intended as an aid in the diagnosis of influenza from Nasopharyngeal swab specimens and should not be used as a sole basis for treatment. Nasal washings and aspirates are unacceptable for Xpert Xpress SARS-CoV-2/FLU/RSV testing.  Fact Sheet for Patients: EntrepreneurPulse.com.au  Fact Sheet for Healthcare Providers: IncredibleEmployment.be  This test is not yet approved or cleared by the [place] FDA and has been authorized for detection and/or diagnosis of  SARS-CoV-2 by FDA under an Emergency Use Authorization (EUA). This EUA will remain in effect (meaning this test can be used) for the duration of the COVID-19 declaration under Section 564(b)(1) of the Act, 21 U.S.C. section 360bbb-3(b)(1), unless the authorization is terminated or revoked.  Performed at [facility], [place] [address], [place], [place] [address]       Antimicrobials:  Cefepime/Flagyl/vancomycin (9/19>>)    Interim history/subjective:     Objective   BP 114/67   Pulse (!) 104   Temp 99.7 F (37.6 C) (Oral)   Resp (!) 28   SpO2 96%     There were no vitals filed for this visit. No intake or output data in the 24 hours ending 06/13/21 2302      Examination: GENERAL:  alert, oriented to time, person and place, pleasant, well-developed. No acute distress. HEAD: normocephalic, atraumatic EYE: PERRLA, EOM intact, no scleral icterus, no pallor. NOSE: nares are patent. No polyps. No  exudate. No sinus tenderness. THROAT/ORAL CAVITY: Normal dentition. No oral thrush. No exudate. Mucous membranes are moist. No tonsillar enlargement.  NECK: supple, no thyromegaly, no JVD, no lymphadenopathy. Trachea midline. CHEST/LUNG: symmetric in development and expansion. Good air entry. No crackles. No wheezes. HEART: Regular S1 and S2 without murmur, rub or gallop. ABDOMEN: soft, nontender, nondistended. Normoactive bowel sounds. No rebound. No guarding. No hepatosplenomegaly. Enterocutaneous fistula with feculent drainage. EXTREMITIES: L UE PICC in situ without overt erythema or purulence or fluctuance. Edema: Trace. No cyanosis. No clubbing. 2+ DP pulses LYMPHATIC: no cervical/axillary/inguinal lymph nodes appreciated MUSCULOSKELETAL: No point tenderness. No bulk atrophy. Joints: normal inspection.  SKIN:  No rash or lesion. NEUROLOGIC: Doll's eyes intact. Corneal reflex intact. Spontaneous respirations intact. Cranial nerves II-XII are grossly symmetric and physiologic. Babinski absent. No sensory deficit. Motor: 5/5 @ RUE, 5/5 @ LUE, 5/5 @ RLL,  5/5 @ LLL.  DTR: 2+ @ R biceps, 2+ @ L biceps, 2+ @ R patellar,  2+ @ L patellar. No cerebellar signs. Gait was not assessed.   Resolved Hospital Problem list      Assessment & Plan:   ASSESSMENT/PLAN:  ASSESSMENT (included in the Hospital Problem List)  Principal Problem:   Septic shock ([place]) Active Problems:   Abdominal wall abscess   Enterocutaneous fistula   GERD   Obesity (BMI 30.0-34.9)   DNR (do not resuscitate)   By systems: PULMONARY: No acute issues Titrate supplemental oxygen to maintain SpO2 93+%   CARDIOVASCULAR Shock, likely septic Hypotension Tachycardia Continue IV fluids: LR @ 150 mL/hr Start norepinephrine/vasopressin as needed for MAP < 65. Trend lactate  RENAL: No acute issues Monitor urine output U/A   GASTROINTESTINAL Enterocutaneous fistula Mucinous adenocarcinoma of the appendix s/p  colectomy, splenectomy, omentectomy, cholecystectomy, colostomy Continue TPN OK to supplement with regular PO diet GI PROPHYLAXIS: famotidine   HEMATOLOGIC LUE PICC line in situ May need to consider line holiday and new PICC placement if blood cultures are positive DVT PROPHYLAXIS: heparin   INFECTIOUS Sepsis/septic shock, unclear etiology: abdominal vs line vs other Follow up blood cultures, U/A Continue empiric cefepime/Flagyl/vancomcyin for now   ENDOCRINE: No acute issues   NEUROLOGIC: No acute issues   PLAN/RECOMMENDATIONS  Admit to ICU under my service (Attending: Renee Pain, MD) with the diagnoses highlighted above in the active Hospital Problem List (ASSESSMENT). IV fluids: NS bolus 1 L followed by LR @ 150 mL/hr Meds: continue antibiotics Labs: trend lactate NUTRITION: TPN and PO nutrition as tolerated    My  assessment, plan of care, findings, medications, side effects, etc. were discussed with: nurse.   Best practice:  Diet: TPN + PO regular Pain/Anxiety/Delirium protocol (if indicated): N/A VAP protocol (if indicated): N/A DVT prophylaxis: heparin GI prophylaxis: famotidine Glucose control: N/A Mobility/Activity: bedrest   Code Status: DNR Family Communication:   no family at bedside . Call placed to wife Driggs, [phone]), no answer. Disposition: admit to ICU   Labs   CBC: Recent Labs  Lab 06/13/21 1318  WBC 47.6*  NEUTROABS 46.6*  HGB 9.5*  HCT 30.9*  MCV 82.0  PLT 414*    Basic Metabolic Panel: Recent Labs  Lab 06/13/21 1318  NA 134*  K 4.2  CL 103  CO2 21*  GLUCOSE 92  BUN 22  CREATININE 0.89  CALCIUM 8.5*   GFR: CrCl cannot be calculated (Unknown ideal weight.). Recent Labs  Lab 06/13/21 1318 06/13/21 1518  WBC 47.6*  --   LATICACIDVEN 2.3* 2.8*    Liver Function Tests: Recent Labs  Lab 06/13/21 1318  AST 56*  ALT 35  ALKPHOS 143*  BILITOT 0.9  PROT 6.9  ALBUMIN 2.2*   No results for input(s): LIPASE,  AMYLASE in the last 168 hours. No results for input(s): AMMONIA in the last 168 hours.  ABG    Component Value Date/Time   PHART 7.374 02/25/2010 0825   PCO2ART 45.1 (H) 02/25/2010 0825   PO2ART 81.0 02/25/2010 0825   HCO3 26.3 (H) 02/25/2010 0825   TCO2 28 02/25/2010 0825   O2SAT 96.0 02/25/2010 0825     Coagulation Profile: Recent Labs  Lab 06/13/21 1318  INR 1.2    Cardiac Enzymes: No results for input(s): CKTOTAL, CKMB, CKMBINDEX, TROPONINI in the last 168 hours.  HbA1C: Hgb A1c MFr Bld  Date/Time Value Ref Range Status  10/08/2020 10:21 AM 5.3 <5.7 % of total Hgb Final    Comment:    For the purpose of screening for the presence of diabetes: . <5.7%       Consistent with the absence of diabetes 5.7-6.4%    Consistent with increased risk for diabetes             (prediabetes) > or =6.5%  Consistent with diabetes . This assay result is consistent with a decreased risk of diabetes. . Currently, no consensus exists regarding use of hemoglobin A1c for diagnosis of diabetes in children. . According to American Diabetes Association (ADA) guidelines, hemoglobin A1c <7.0% represents optimal control in non-pregnant diabetic patients. Different metrics may apply to specific patient populations.  Standards of Medical Care in Diabetes(ADA). Marland Kitchen   03/19/2020 10:11 AM 5.3 <5.7 % of total Hgb Final    Comment:    For the purpose of screening for the presence of diabetes: . <5.7%       Consistent with the absence of diabetes 5.7-6.4%    Consistent with increased risk for diabetes             (prediabetes) > or =6.5%  Consistent with diabetes . This assay result is consistent with a decreased risk of diabetes. . Currently, no consensus exists regarding use of hemoglobin A1c for diagnosis of diabetes in children. . According to American Diabetes Association (ADA) guidelines, hemoglobin A1c <7.0% represents optimal control in non-pregnant diabetic patients.  Different metrics may apply to specific patient populations.  Standards of Medical Care in Diabetes(ADA). .     CBG: No results for input(s): GLUCAP in the last 168 hours.   Past Medical History  Past Medical History:  Diagnosis Date   Allergy    BPH (benign prostatic hypertrophy)    Elevated serum immunoglobulin free light chains 04/28/2014   Erythrocytosis 04/28/2014   Exertional dyspnea    ETT myoview 5/11: 8 min, no ischemic ECG changes, stopped due to fatigue/ SOB. EF 60%, no ischemia or infarction. Echo 5/11 EF 60%, no significant valvular abnormalities, mild RV dilation and dysfunction, no complete TR doppler jet so PA pressure hard to gauge. 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 R heart cath 6/11: mean RA 10 mmHg, PA 25/18, mean PCWP 13 mmHg, CI 2.1   Fibromyalgia    GERD (gastroesophageal reflux disease)    History of epididymitis    History of head injury without skull fracture 1980   Concussion   Hx of adenomatous polyp of colon 06/19/2007   Hyperlipidemia    Myalgias with Lipitor   Hypertension    Hypogonadism male    Myofascial pain syndrome    Nephrolithiasis    Prolapsed internal hemorrhoids    Prostatitis    Recurrent   Rheumatic fever    x3 at ages [age] and 69; No significant valvular abnormality on 5/11 echo   Tendinitis 2000   Hx of left bicep   Vitamin D deficiency       Surgical History    Past Surgical History:  Procedure Laterality Date   BIH REPAIR  08/2005   Incarverated fat by mesh repair   CARDIAC CATHETERIZATION  02/2010   Left and right   COLONOSCOPY  06/09/2015   [facility]   FINGER SURGERY  2000   Repair of injury to right fifth finger   HEEL SPUR SURGERY  1960   Bilateral   TONSILLECTOMY  1962      Social History   Social History   Socioeconomic History   Marital status: Married    Spouse name: Not on file   Number of children: Not on file   Years of education: Not on file   Highest education  level: Not on file  Occupational History   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    Employer: OTHER    Comment: Full time  Tobacco Use   Smoking status: Never   Smokeless tobacco: Never  Vaping Use   Vaping Use: Never used  Substance and Sexual Activity   Alcohol use: Yes    Alcohol/week: 12.0 standard drinks    Types: 12 Cans of beer per week    Comment: beer and liquoer   Drug use: No   Sexual activity: Not on file  Other Topics Concern   Not on file  Social History Narrative   Married, lives in [place] Determinants of Health   Financial Resource Strain: Not on file  Food Insecurity: Not on file  Transportation Needs: Not on file  Physical Activity: Not on file  Stress: Not on file  Social Connections: Not on file      Family History    Family History  Problem Relation Age of Onset   Hypertension Mother    Breast cancer Mother    Aortic stenosis Father 50   Hypertension Other    Aortic stenosis Other    Breast cancer Other    Diabetes Other    Other Other        ASHD and epilepsy   Stroke Neg Hx    Colon cancer Neg Hx  Colon polyps Neg Hx    Esophageal cancer Neg Hx    Rectal cancer Neg Hx    Stomach cancer Neg Hx    family history includes Aortic stenosis in an other family member; Aortic stenosis (age of onset: 43) in his father; Breast cancer in his mother and another family member; Diabetes in an other family member; Hypertension in his mother and another family member; Other in an other family member. There is no history of Stroke, Colon cancer, Colon polyps, Esophageal cancer, Rectal cancer, or Stomach cancer.    Allergies Allergies  Allergen Reactions   Atenolol Other (See Comments)    Unknown reaction   Atorvastatin Other (See Comments)    Unknown reaction   Darvocet [Propoxyphene N-Acetaminophen] Other (See Comments)    Unknown reaction   Paroxetine Other (See Comments)    Unknown  reaction   Pregabalin Other (See Comments)    Unknown reaction   Propoxyphene Hcl Other (See Comments)    Unknown reaction      Current Medications  Current Facility-Administered Medications:    alteplase (CATHFLO ACTIVASE) injection 2 mg, 2 mg, Intracatheter, Once, Pfeiffer, Jeannie Done, MD   calcium carbonate (TUMS - dosed in mg elemental calcium) chewable tablet 200 mg of elemental calcium, 1 tablet, Oral, Once, Wynona Dove A, DO   ceFEPIme (MAXIPIME) 2 g in sodium chloride 0.9 % 100 mL IVPB, 2 g, Intravenous, Q8H, Heloise Purpura, RPH   Chlorhexidine Gluconate Cloth 2 % PADS 6 each, 6 each, Topical, Daily, Wynona Dove A, DO   lactated ringers infusion, , Intravenous, Continuous, Pfeiffer, Jeannie Done, MD, Last Rate: 150 mL/hr at 06/13/21 1856, New Bag at 06/13/21 1856   norepinephrine (LEVOPHED) '4mg'$  in 259m premix infusion, 0-40 mcg/min, Intravenous, Continuous, GWynona DoveA, DO   sodium chloride flush (NS) 0.9 % injection 10-40 mL, 10-40 mL, Intracatheter, PRN, GJeanell Sparrow DO   [START ON 06/14/2021] vancomycin (VANCOREADY) IVPB 2000 mg/400 mL, 2,000 mg, Intravenous, Q24H, [facility]eloise Purpura [facility] Current Outpatient Medications:    buPROPion (WELLBUTRIN XL) 300 MG 24 hr tablet, Take 300 mg by mouth daily., Disp: , Rfl:    ciprofloxacin (CIPRO) 500 MG tablet, Take 500 mg by mouth 2 (two) times daily., Disp: , Rfl:    furosemide (LASIX) 20 MG tablet, Take 20 mg by mouth daily., Disp: , Rfl:    heparin 5000 UNIT/ML injection, Inject 1 mL (5,000 Units total) into the skin every 8 (eight) hours., Disp: 1 mL, Rfl:    metroNIDAZOLE (FLAGYL) 500 MG tablet, Take 500 mg by mouth every 6 (six) hours., Disp: , Rfl:    sertraline (ZOLOFT) 100 MG tablet, Take 100 mg by mouth at bedtime., Disp: , Rfl:    anidulafungin in sodium chloride 0.9 % 100 mL, anidulafungin (ERAXIS) 100 mg in sodium chloride 0.9 % 100 mL IVPB 100 mg, Intravenous, at 78 mL/hr, Every 24 hours, First dose on Sun 04/17/21 at 1200   Instructions:   Infusion rate should not exceed 1.1 mg/minute (84 mL/hr). (Patient not taking: No sig reported), Disp: , Rfl:    calcium carbonate (TUMS - DOSED IN MG ELEMENTAL CALCIUM) 500 MG chewable tablet, Chew 1 tablet (200 mg of elemental calcium total) by mouth every 4 (four) hours as needed for indigestion or heartburn. (Patient not taking: No sig reported), Disp: , Rfl:    ceFEPIme 2 g in sodium chloride 0.9 % 100 mL, Inject 2 g into the vein every 8 (eight) hours. (Patient not taking: No  sig reported), Disp: , Rfl:    diclofenac Sodium (VOLTAREN) 1 % GEL, Apply 4 g topically 4 (four) times daily. (Patient not taking: No sig reported), Disp: , Rfl:    docusate sodium (COLACE) 100 MG capsule, Take 1 capsule (100 mg total) by mouth 2 (two) times daily as needed for mild constipation. (Patient not taking: No sig reported), Disp: 10 capsule, Rfl: 0   famotidine (PEPCID) 20-0.9 MG/50ML-%, Inject 50 mLs (20 mg total) into the vein every 12 (twelve) hours. (Patient not taking: No sig reported), Disp: 50 mL, Rfl:    lactated ringers infusion, Inject 75 mLs into the vein continuous., Disp: 250 mL, Rfl: 0   metroNIDAZOLE (FLAGYL) 500 MG/100ML, Inject 100 mLs (500 mg total) into the vein every 12 (twelve) hours. (Patient not taking: No sig reported), Disp: , Rfl:    Mouthwashes (MOUTH RINSE) LIQD solution, 15 mLs by Mouth Rinse route 2 (two) times daily. (Patient not taking: No sig reported), Disp: , Rfl: 0   ondansetron (ZOFRAN) 4 MG/2ML SOLN injection, Inject 2 mLs (4 mg total) into the vein every 6 (six) hours as needed for nausea or vomiting. (Patient not taking: No sig reported), Disp: 2 mL, Rfl: 0   pantoprazole (PROTONIX) 40 MG injection, Inject 40 mg into the vein at bedtime. (Patient not taking: No sig reported), Disp: 1 each, Rfl:    polyethylene glycol (MIRALAX / GLYCOLAX) 17 g packet, Take 17 g by mouth daily as needed for moderate constipation. (Patient not taking: No sig reported), Disp: 14  each, Rfl: 0   vancomycin (VANCOREADY) 1250 MG/250ML SOLN, Inject 250 mLs (1,250 mg total) into the vein every 12 (twelve) hours. (Patient not taking: No sig reported), Disp: , Rfl:    Home Medications  Prior to Admission medications   Medication Sig Start Date End Date Taking? Authorizing Provider  buPROPion (WELLBUTRIN XL) 300 MG 24 hr tablet Take 300 mg by mouth daily.   Yes [provider]  ciprofloxacin (CIPRO) 500 MG tablet Take 500 mg by mouth 2 (two) times daily. 06/06/21  Yes [provider]  furosemide (LASIX) 20 MG tablet Take 20 mg by mouth daily. 06/06/21  Yes [provider]  heparin 5000 UNIT/ML injection Inject 1 mL (5,000 Units total) into the skin every 8 (eight) hours. 04/18/21  Yes Estill Cotta, NP  metroNIDAZOLE (FLAGYL) 500 MG tablet Take 500 mg by mouth every 6 (six) hours. 06/06/21  Yes [provider]  sertraline (ZOLOFT) 100 MG tablet Take 100 mg by mouth at bedtime.   Yes [provider]  anidulafungin in sodium chloride 0.9 % 100 mL anidulafungin (ERAXIS) 100 mg in sodium chloride 0.9 % 100 mL IVPB 100 mg, Intravenous, at 78 mL/hr, Every 24 hours, First dose on Sun 04/17/21 at 1200  Instructions:   Infusion rate should not exceed 1.1 mg/minute (84 mL/hr). Patient not taking: No sig reported 04/17/21   Estill Cotta, NP  calcium carbonate (TUMS - DOSED IN MG ELEMENTAL CALCIUM) 500 MG chewable tablet Chew 1 tablet (200 mg of elemental calcium total) by mouth every 4 (four) hours as needed for indigestion or heartburn. Patient not taking: No sig reported 04/17/21   Estill Cotta, NP  ceFEPIme 2 g in sodium chloride 0.9 % 100 mL Inject 2 g into the vein every 8 (eight) hours. Patient not taking: No sig reported 04/17/21   Estill Cotta, NP  diclofenac Sodium (VOLTAREN) 1 % GEL Apply 4 g topically 4 (  four) times daily. Patient not taking: No sig reported 04/17/21   Estill Cotta, NP  docusate sodium (COLACE) 100  MG capsule Take 1 capsule (100 mg total) by mouth 2 (two) times daily as needed for mild constipation. Patient not taking: No sig reported 04/17/21   Estill Cotta, NP  famotidine (PEPCID) 20-0.9 MG/50ML-% Inject 50 mLs (20 mg total) into the vein every 12 (twelve) hours. Patient not taking: No sig reported 04/17/21   Estill Cotta, NP  lactated ringers infusion Inject 75 mLs into the vein continuous. 04/17/21   Estill Cotta, NP  metroNIDAZOLE (FLAGYL) 500 MG/100ML Inject 100 mLs (500 mg total) into the vein every 12 (twelve) hours. Patient not taking: No sig reported 04/18/21   Estill Cotta, NP  Mouthwashes (MOUTH RINSE) LIQD solution 15 mLs by Mouth Rinse route 2 (two) times daily. Patient not taking: No sig reported 04/17/21   Estill Cotta, NP  ondansetron [facility]) 4 MG/2ML SOLN injection Inject 2 mLs (4 mg total) into the vein every 6 (six) hours as needed for nausea or vomiting. Patient not taking: No sig reported 04/17/21   Estill Cotta, NP  pantoprazole (PROTONIX) 40 MG injection Inject 40 mg into the vein at bedtime. Patient not taking: No sig reported 04/17/21   Estill Cotta, NP  polyethylene glycol (MIRALAX / GLYCOLAX) 17 g packet Take 17 g by mouth daily as needed for moderate constipation. Patient not taking: No sig reported 04/17/21   Estill Cotta, NP  vancomycin (VANCOREADY) 1250 MG/250ML SOLN Inject 250 mLs (1,250 mg total) into the vein every 12 (twelve) hours. Patient not taking: No sig reported 04/17/21   Estill Cotta, NP      Critical care time: 60 minutes.  The treatment and management of the patient's condition was required based on the threat of imminent deterioration. This time reflects time spent by the physician evaluating, providing care and managing the critically ill patient's care. The time was spent at the immediate bedside (or on the same floor/unit and dedicated to this patient's care). Time involved in separately billable  procedures is NOT included int he critical care time indicated above. Family meeting and update time may be included above if and only if the patient is unable/incompetent to participate in clinical interview and/or decision making, and the discussion was necessary to determining treatment decisions.   Renee Pain, MD Board Certified by the ABIM, [place]

## 2021-06-13 NOTE — ED Triage Notes (Signed)
Pt BIB [facility] from home C/O fever. Per EMS, pt has extensive GI hx with recent gallbladder, spleen, and appendix removal. Pt had fever that did not come down with Tylenol.

## 2021-06-13 NOTE — ED Provider Notes (Signed)
  Provider Note MRN:  [id number]  Arrival date & time: 06/13/21    ED Course and Medical Decision Making  Assumed care from Dr Johnney Killian at shift change. See her note for further details.  Hx mucinous adenocarcinoma with enterocutaneous fistula Severe sepsis identified, started on broad spectrum antibiotiocs vancomycin, cefepime and flagyl for presumed intra-abdominal infection. Significant leukocytosis, lactic acidosis, tachycardia, febrile, cultures sent prior to abx. CT abdomen/pelvis consistent with history of mucinous adenocarcinoma with enterocutaneous fistula. At this time concern for ongoing infection to abdomen. It is possible that he has a line infection with his TPN line to the LUE. Admission was called by prior team but inpatient service at this facility is requesting patient be sent to [facility] as that is where he was previously treated for this abnormality.   D/w with surgical oncology at [facility], they will place patient on wait list but no physical bed available. Given patient hypotensive despite 3L of IVF, will plan to admit pt to ICU until more stable for transfer to [facility] and when a bed becomes available.   Plan to d/c patients LUE line (TPN received here) as likely source of infection.  Will start levophed peripherally at this time. Continue broad spectrum abx. Plan for ICU admission.   D/w ICU physician at bedside.       CRITICAL CARE Performed by: Jeanell Sparrow   Total critical care time: 72 minutes  Critical care time was exclusive of separately billable procedures and treating other patients.  Critical care was necessary to treat or prevent imminent or life-threatening deterioration.  Critical care was time spent personally by me on the following activities: development of treatment plan with patient and/or surrogate as well as nursing, discussions with consultants, evaluation of patient's response to treatment, examination of patient, obtaining history from patient or  surrogate, ordering and performing treatments and interventions, ordering and review of laboratory studies, ordering and review of radiographic studies, pulse oximetry and re-evaluation of patient's condition.   Final Clinical Impressions(s) / ED Diagnoses     ICD-10-CM   1. Septic shock (HCC)  A41.9    R65.21     2. 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 Infection of peripherally inserted central catheter (PICC), initial encounter  T80.219A       ED Discharge Orders     None       Discharge Instructions   None          Jeanell Sparrow, DO 06/13/21 2255

## 2021-06-13 NOTE — ED Notes (Addendum)
MD made aware of increasing hypotension. Will notify if BP falls below MAP 60, Systolic 0000000.

## 2021-06-13 NOTE — Progress Notes (Signed)
Pharmacy Antibiotic Note  Curtis Cera. is a 75 y.o. male admitted on 06/13/2021 with  intra-abdominal infection .  Pharmacy has been consulted for cefepime and vancomycin dosing.  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  Recent admission at [facility] (8/23-9/10) with RLQ abscess/cellulitis.  Scr 0.89; WBC 47.6; LA 2.3  Plan: Flagyl per MD Cefepime 2g q8h Vancomycin '2000mg'$  once then 2000 mg q24hr (eAUC 425) unless  change in renal function. Trend fever, WBC, renal function F/u cultures De-escalate when able     Temp (24hrs), Avg:102.4 F (39.1 C), Min:102.4 F (39.1 C), Max:102.4 F (39.1 C)  Recent Labs  Lab 06/13/21 1318  WBC 47.6*    CrCl cannot be calculated (Patient's most recent lab result is older than the maximum 21 days allowed.).    Allergies  Allergen Reactions   Atenolol Other (See Comments)    Unknown reaction   Atorvastatin Other (See Comments)    Unknown reaction   Darvocet [Propoxyphene N-Acetaminophen] Other (See Comments)    Unknown reaction   Paroxetine Other (See Comments)    Unknown reaction   Pregabalin Other (See Comments)    Unknown reaction   Propoxyphene Hcl Other (See Comments)    Unknown reaction    Antimicrobials this admission: metronidazole 9/19 >>  vancomycin 9/19 >>  Cefepime 9/19 >>  Microbiology results: Pending  Thank you for allowing pharmacy to be a part of this patient's care.  Lorelei Pont, PharmD, BCPS 06/13/2021 4:20 PM ED Clinical Pharmacist -  [phone]

## 2021-06-14 ENCOUNTER — Other Ambulatory Visit: Payer: Self-pay

## 2021-06-14 ENCOUNTER — Inpatient Hospital Stay (HOSPITAL_COMMUNITY): Payer: Medicare Other

## 2021-06-14 ENCOUNTER — Encounter (HOSPITAL_COMMUNITY): Payer: Self-pay | Admitting: Pulmonary Disease

## 2021-06-14 DIAGNOSIS — A419 Sepsis, unspecified organism: Secondary | ICD-10-CM

## 2021-06-14 DIAGNOSIS — L02211 Cutaneous abscess of abdominal wall: Secondary | ICD-10-CM

## 2021-06-14 DIAGNOSIS — R6521 Severe sepsis with septic shock: Secondary | ICD-10-CM

## 2021-06-14 DIAGNOSIS — C181 Malignant neoplasm of appendix: Secondary | ICD-10-CM | POA: Diagnosis not present

## 2021-06-14 DIAGNOSIS — B9561 Methicillin susceptible Staphylococcus aureus infection as the cause of diseases classified elsewhere: Secondary | ICD-10-CM

## 2021-06-14 DIAGNOSIS — K651 Peritoneal abscess: Secondary | ICD-10-CM | POA: Diagnosis not present

## 2021-06-14 DIAGNOSIS — K632 Fistula of intestine: Secondary | ICD-10-CM | POA: Diagnosis not present

## 2021-06-14 DIAGNOSIS — R7881 Bacteremia: Secondary | ICD-10-CM

## 2021-06-14 DIAGNOSIS — T80211A Bloodstream infection due to central venous catheter, initial encounter: Secondary | ICD-10-CM

## 2021-06-14 DIAGNOSIS — K219 Gastro-esophageal reflux disease without esophagitis: Secondary | ICD-10-CM

## 2021-06-14 LAB — BLOOD CULTURE ID PANEL (REFLEXED) - BCID2

## 2021-06-14 LAB — GLUCOSE, CAPILLARY
Glucose-Capillary: 110 mg/dL — ABNORMAL HIGH (ref 70–99)
Glucose-Capillary: 97 mg/dL (ref 70–99)

## 2021-06-14 LAB — ECHOCARDIOGRAM COMPLETE
AR max vel: 2.17 cm2
AV Area VTI: 1.98 cm2
AV Area mean vel: 1.85 cm2
AV Mean grad: 6 mmHg
AV Peak grad: 11.3 mmHg
Ao pk vel: 1.68 m/s
Area-P 1/2: 5.88 cm2
Height: 71 in
S' Lateral: 2.8 cm
Weight: 3030 oz

## 2021-06-14 LAB — COMPREHENSIVE METABOLIC PANEL
ALT: UNDETERMINED U/L (ref 0–44)
AST: 65 U/L — ABNORMAL HIGH (ref 15–41)
Albumin: 2 g/dL — ABNORMAL LOW (ref 3.5–5.0)
Alkaline Phosphatase: 165 U/L — ABNORMAL HIGH (ref 38–126)
Anion gap: 14 (ref 5–15)
BUN: 26 mg/dL — ABNORMAL HIGH (ref 8–23)
CO2: 17 mmol/L — ABNORMAL LOW (ref 22–32)
Calcium: 8.7 mg/dL — ABNORMAL LOW (ref 8.9–10.3)
Chloride: 101 mmol/L (ref 98–111)
Creatinine, Ser: 1.05 mg/dL (ref 0.61–1.24)
GFR, Estimated: >60 mL/min (ref >60)
Glucose, Bld: 92 mg/dL (ref 70–99)
Potassium: 5.5 mmol/L — ABNORMAL HIGH (ref 3.5–5.1)
Sodium: 132 mmol/L — ABNORMAL LOW (ref 135–145)
Total Bilirubin: UNDETERMINED mg/dL (ref 0.3–1.2)
Total Protein: 6.1 g/dL — ABNORMAL LOW (ref 6.5–8.1)

## 2021-06-14 LAB — CBC
HCT: 26.7 % — ABNORMAL LOW (ref 39.0–52.0)
Hemoglobin: 8.3 g/dL — ABNORMAL LOW (ref 13.0–17.0)
MCH: 25.4 pg — ABNORMAL LOW (ref 26.0–34.0)
MCHC: 31.1 g/dL (ref 30.0–36.0)
MCV: 81.7 fL (ref 80.0–100.0)
Platelets: 427 10*3/uL — ABNORMAL HIGH (ref 150–400)
RBC: 3.27 MIL/uL — ABNORMAL LOW (ref 4.22–5.81)
RDW: 22.6 % — ABNORMAL HIGH (ref 11.5–15.5)
WBC: 50.5 10*3/uL (ref 4.0–10.5)
nRBC: 0.2 % (ref 0.0–0.2)

## 2021-06-14 LAB — MAGNESIUM: Magnesium: 1.5 mg/dL — ABNORMAL LOW (ref 1.7–2.4)

## 2021-06-14 LAB — BILIRUBIN, TOTAL: Total Bilirubin: 0.7 mg/dL (ref 0.3–1.2)

## 2021-06-14 LAB — LACTIC ACID, PLASMA: Lactic Acid, Venous: 2.3 mmol/L (ref 0.5–1.9)

## 2021-06-14 LAB — MRSA NEXT GEN BY PCR, NASAL: MRSA by PCR Next Gen: NOT DETECTED

## 2021-06-14 LAB — PHOSPHORUS: Phosphorus: 4.6 mg/dL (ref 2.5–4.6)

## 2021-06-14 MED ORDER — DEXTROSE 10 % IV SOLN: INTRAVENOUS | Status: DC

## 2021-06-14 MED ORDER — ACETAMINOPHEN 160 MG/5ML PO SOLN
650.0000 mg | Freq: Four times a day (QID) | ORAL | Status: DC | PRN
  Administered 2021-06-14: 650 mg via ORAL
  Filled 2021-06-14: qty 20.3

## 2021-06-14 MED ORDER — METRONIDAZOLE 500 MG/100ML IV SOLN
500.0000 mg | Freq: Two times a day (BID) | INTRAVENOUS | Status: DC
  Administered 2021-06-14: 500 mg via INTRAVENOUS
  Filled 2021-06-14: qty 100

## 2021-06-14 MED ORDER — CEFAZOLIN SODIUM-DEXTROSE 2-4 GM/100ML-% IV SOLN
2.0000 g | Freq: Three times a day (TID) | INTRAVENOUS | Status: DC
  Administered 2021-06-14 (×2): 2 g via INTRAVENOUS
  Filled 2021-06-14 (×5): qty 100

## 2021-06-14 MED ORDER — MAGNESIUM SULFATE 4 GM/100ML IV SOLN
4.0000 g | Freq: Once | INTRAVENOUS | Status: AC
  Administered 2021-06-14: 4 g via INTRAVENOUS
  Filled 2021-06-14: qty 100

## 2021-06-14 MED ORDER — CIPROFLOXACIN IN D5W 400 MG/200ML IV SOLN
400.0000 mg | Freq: Two times a day (BID) | INTRAVENOUS | Status: DC
  Administered 2021-06-14: 400 mg via INTRAVENOUS
  Filled 2021-06-14 (×3): qty 200

## 2021-06-14 MED ORDER — ORAL CARE MOUTH RINSE
15.0000 mL | Freq: Two times a day (BID) | OROMUCOSAL | Status: DC
  Administered 2021-06-14 (×2): 15 mL via OROMUCOSAL

## 2021-06-14 MED ORDER — INSULIN ASPART 100 UNIT/ML IJ SOLN: 0.0000 [IU] | INTRAMUSCULAR | Status: DC

## 2021-06-14 MED ORDER — FAMOTIDINE 20 MG IN NS 100 ML IVPB
20.0000 mg | Freq: Two times a day (BID) | INTRAVENOUS | Status: DC
  Administered 2021-06-14 (×2): 20 mg via INTRAVENOUS
  Filled 2021-06-14 (×3): qty 100

## 2021-06-14 NOTE — Progress Notes (Signed)
Pt. Educated on procedure prior to PICC line removal. Wife at bedside during teaching. HOB lowered to almost flat during removal. Patient and nurse were instructed to keep HOB < 45 degrees for 30 minutes and that patient is to remain in bed for 30 minutes following PICC removal. Vaseline gauze and 2X2 gauze applied with pressure during removal then dressing held in place with pressure using medipore tape.

## 2021-06-14 NOTE — Progress Notes (Signed)
Bed available at [facility]. Patient and family informed. Report called to [place], Therapist, sports. Patient awaiting transport.

## 2021-06-14 NOTE — Progress Notes (Signed)
Initial Nutrition Assessment  DOCUMENTATION CODES:   Non-severe (moderate) malnutrition in context of chronic illness  INTERVENTION:   Once PICC able to be replaced:  Restart TPN, advance to meet 100% of needs  Advance diet for patient comfort  NUTRITION DIAGNOSIS:   Moderate Malnutrition related to chronic illness (EC fistula) as evidenced by mild fat depletion, moderate muscle depletion.  GOAL:   Patient will meet greater than or equal to 90% of their needs  MONITOR:   Labs, I & O's, Skin, TF tolerance, Weight trends, Diet advancement  REASON FOR ASSESSMENT:   Consult New TPN/TNA  ASSESSMENT:   Patient with PMH significant for BPH, GERD, HLD, HTN, prolapsed internal hemorrhoids, and mucinous adenocarcinoma of appendix s/p colectomy, splenectomy, omentectomy, cholecystectomy, and colostomy requiring chronic TPN. Presents this admission with sepsis likely secondary to line infection.  Pressors weaned. Patient lethargic upon RD assessment.   Spoke with wife on the phone who reports patient was receiving home TPN daily through [facility] home infusion. Home prescription is as follows: PN 3-in-1 2000  mL over 24 hours at rate of 157ml/hr to provide 123 g protein and 2542 kcal daily. Wife reports patient was told he could have clear liquids at home but he sometimes takes bits of frozen peaches or chicken noodle soup. Reports a few month ago he ongoing increased stool output but this has slowed. States they typically empty bag once/day.   Blood cultures came back positive. Plan line holiday while abx run with the intention of placing new PICC in the next few days. A transfer request has been made for [facility] as patient is followed there.   Patient denies unintentional weight loss. Records indicate patient weighed 215 lb on 10/08/20 and 189 lb this admission. Suspect significant dry weight loss but unable to quantify as wife reports fluctuating generalized fluid  accumulation. Patient currently meets criteria for moderate malnutrition but suspect fluid could be masking losses.   UOP: 1075 ml x 24 hrs Drainage bag (abdomen): 450 ml x 24 hrs   Drips: D10 @ 75 ml/hr  Medications: SS novolog Labs: Na 132 (L) K 5.5 (H) Mg 1.5 (L) CBG 92  NUTRITION - FOCUSED PHYSICAL EXAM:  Flowsheet Row Most Recent Value  Orbital Region No depletion  Upper Arm Region Mild depletion  Thoracic and Lumbar Region Unable to assess  Buccal Region Mild depletion  Temple Region Moderate depletion  Clavicle Bone Region Mild depletion  Clavicle and Acromion Bone Region Moderate depletion  Scapular Bone Region Moderate depletion  Dorsal Hand Mild depletion  Patellar Region Moderate depletion  Anterior Thigh Region Moderate depletion  Posterior Calf Region Moderate depletion  Edema (RD Assessment) Moderate  Hair Reviewed  Eyes Reviewed  Mouth Reviewed  Skin Reviewed  Nails Reviewed      Diet Order:   Diet Order             Diet NPO time specified  Diet effective now                   EDUCATION NEEDS:   Education needs have been addressed  Skin:  Skin Assessment: Skin Integrity Issues: Skin Integrity Issues:: Stage I, Other (Comment) Stage I: coccyx Other: EC fistula  Last BM:  9/20  Height:   Ht Readings from Last 1 Encounters:  06/14/21 5\' 11"  (1.803 m)    Weight:   Wt Readings from Last 1 Encounters:  06/14/21 85.9 kg   BMI:  Body mass index is 26.41  kg/m.  Estimated Nutritional Needs:   Kcal:  2500-2700 kcal  Protein:  120-145 grams  Fluid:  >/= 2 L/day  Mariana Single MS, RD, LDN, CNSC Clinical Nutrition Pager listed in [place]

## 2021-06-14 NOTE — Consult Note (Signed)
[facility] Nurse Consult Note: Patient receiving care in [facility]. Reason for Consult: colostomy Wound type: Enterocutaneous fistula mid-lower abdomen that the patient states has been there since he had surgery in April of 2022. Pressure Injury POA: Yes/No/NA Measurement: Wound bed: Drainage (amount, consistency, odor) dark brown, thin, effluent Periwound: deferred.  Current pouch not leaking and supplies had to be ordered. Dressing procedure/placement/frequency:  The draining wound on the patient's lower mid-abdomen is an enterocutaneous fistula, NOT a colostomy. The supplies you need for pouching this wound and connecting it to a bedside drainage bag are: Small Eakin pouch Kellie Simmering [phone]); skin barrier film wipes, Kellie Simmering [phone]; and perhaps, a barrier ring, Kellie Simmering [phone].   CHANGE THE POUCH IF IT IS LEAKING, and every 7 - 10 days. Wash the skin WITH WATER ONLY  and dry thoroughly. Apply skin barrier film and allow to dry. Cut the back of the Eakin pouch to fit around the draining EC fistula. Apply the pouch to dry skin and connect to bedside drainage bag.  Thank you for the consult.  Discussed plan of care with the patient and bedside nurse.  [place] nurse will not follow at this time.  Please re-consult the [place] team if needed.  Val Riles, RN, MSN, CWOCN, CNS-BC, pager [phone]

## 2021-06-14 NOTE — Evaluation (Addendum)
Physical Therapy Evaluation Patient Details Name: Curtis Price. MRN: [id number] DOB: Apr 17, 1946 Today's Date: 06/14/2021  History of Present Illness  Pt is 75 yo male admitted with septic shock unclear etiology on 06/13/21.  Pt with hx of mucinous adenocarcinoma of appendix s/p colectomy, splenectomy, mentectomy, cholecystectomy, colostomy Feb 2022, requiring TPN, enterocutanous fistula, several recent hospitalizations  Clinical Impression   Pt admitted with above diagnosis. Prior to surgeries earlier this year pt was completely independent and working.  Since initial surgery -he has been in/out of hospital and with significant declines in mobility.  Pt has DME at home and support from wife.  Wife assisted with all transfers, adls, and pushed w/c. He was starting [facility] services.  Today, pt able to transfer with min-mod A and ambulated 3' to chair with min A.  He had good sitting balance and tested grossly 4/5 strength throughout.  Feel that pt has potential for increased activity (even from current baseline) based on his performance today and prior level of function.  Pt currently with functional limitations due to the deficits listed below (see PT Problem List). Pt will benefit from skilled PT to increase their independence and safety with mobility to allow discharge to the venue listed below.          Recommendations for follow up therapy are one component of a multi-disciplinary discharge planning process, led by the attending physician.  Recommendations may be updated based on patient status, additional functional criteria and insurance authorization.  Follow Up Recommendations Home health PT;Supervision/Assistance - 24 hour    Equipment Recommendations  None recommended by PT    Recommendations for Other Services       Precautions / Restrictions Precautions Precautions: Fall Precaution Comments: ostomy      Mobility  Bed Mobility Overal bed mobility: Needs Assistance Bed  Mobility: Supine to Sit     Supine to sit: Mod assist     General bed mobility comments: Cues to slide legs to EOB and scoot buttock toward edge and then mod A to lift trunk; with cues pt able to scoot forward    Transfers Overall transfer level: Needs assistance Equipment used: Rolling walker (2 wheeled) Transfers: Sit to/from Stand Sit to Stand: Min assist         General transfer comment: Light min A to stand with cues for hand placement  Ambulation/Gait Ambulation/Gait assistance: Min assist Gait Distance (Feet): 3 Feet Assistive device: Rolling walker (2 wheeled) Gait Pattern/deviations: Step-to pattern;Decreased stride length;Shuffle Gait velocity: decreased   General Gait Details: Steps to chair with cues for RW and light min A to steady  Stairs            Wheelchair Mobility    Modified Rankin (Stroke Patients Only)       Balance Overall balance assessment: Needs assistance Sitting-balance support: No upper extremity supported Sitting balance-Leahy Scale: Fair     Standing balance support: Bilateral upper extremity supported Standing balance-Leahy Scale: Poor Standing balance comment: requiring RW - min A with ambulation, min guard static                             Pertinent Vitals/Pain Pain Assessment: Faces Faces Pain Scale: Hurts a little bit Pain Location: generalized Pain Descriptors / Indicators: Discomfort Pain Intervention(s): Limited activity within patient's tolerance;Monitored during session    Home Living Family/patient expects to be discharged to:: Private residence Living Arrangements: Spouse/significant other Available Help at Discharge:  Family;Available 24 hours/day Type of Home: House Home Access: Stairs to enter   [employer] of Steps: 4 Home Layout: One level Home Equipment: Walker - 2 wheels;Wheelchair - manual;Hospital bed;Bedside commode;Shower seat - built in;Grab bars - toilet;Grab bars -  tub/shower      Prior Function Level of Independence: Needs assistance   Gait / Transfers Assistance Needed: Pt takes a few steps with RW and assist for transfers but mostly uses w/c and wife pushes w/c  ADL's / Homemaking Assistance Needed: Requiring assist for all ADLs.  For bathroom mostly uses bed pan or urinal, for showers sits on tub bench and wife assist, wife assist with dressing, reports easily fatigues  Comments: Prior to surgeries in Feb and April, pt was completely independent - worked full time, lots of walking.  Was getting ready to start [facility] therapies (did not do SNF or rehab past hospitalization due to wouldn't cover TPN per wife)     Hand Dominance        Extremity/Trunk Assessment   Upper Extremity Assessment Upper Extremity Assessment: RUE deficits/detail;LUE deficits/detail RUE Deficits / Details: ROM WFL; MMT 4/5; tremors (current baseline) LUE Deficits / Details: ROM WFL; MMT 4/5; tremors (current baseline)    Lower Extremity Assessment Lower Extremity Assessment: LLE deficits/detail;RLE deficits/detail RLE Deficits / Details: ROM WFL; MMT 4/5; tremors (current baseline) LLE Deficits / Details: ROM WFL; MMT 4/5; tremors (current baseline)    Cervical / Trunk Assessment Cervical / Trunk Assessment: Normal  Communication   Communication: HOH  Cognition Arousal/Alertness: Awake/alert Behavior During Therapy: Flat affect Overall Cognitive Status: Within Functional Limits for tasks assessed                                 General Comments: Pt with flat affect and limited responses, but able to follow commands and answered questions appropriately.      General Comments General comments (skin integrity, edema, etc.): VSS , on RA, wife present  Note: spoke with PA in regards to therapy order "assess joint mobility while on bedrest."  Reports pt is not medically on bedrest and strict bedrest orders are being removed.  Had entered order "while on  bedrest" due to pt's lower level of activity.  [place] for PT eval and tx.     Exercises     Assessment/Plan    PT Assessment Patient needs continued PT services  PT Problem List Decreased strength;Decreased mobility;Decreased safety awareness;Decreased coordination;Decreased activity tolerance;Decreased cognition;Cardiopulmonary status limiting activity;Decreased balance;Decreased knowledge of use of DME;Pain       PT Treatment Interventions Therapeutic activities;DME instruction;Gait training;Therapeutic exercise;Patient/family education;Balance training;Functional mobility training;Wheelchair mobility training    PT Goals (Current goals can be found in the Care Plan section)  Acute Rehab PT Goals Patient Stated Goal: return home PT Goal Formulation: With patient/family Time For Goal Achievement: 06/28/21 Potential to Achieve Goals: Good    Frequency Min 3X/week   Barriers to discharge        Co-evaluation               AM-PAC PT "6 Clicks" Mobility  Outcome Measure Help needed turning from your back to your side while in a flat bed without using bedrails?: A Little Help needed moving from lying on your back to sitting on the side of a flat bed without using bedrails?: A Lot Help needed moving to and from a bed to a chair (including a wheelchair)?: A  Little Help needed standing up from a chair using your arms (e.g., wheelchair or bedside chair)?: A Little Help needed to walk in hospital room?: A Little Help needed climbing 3-5 steps with a railing? : A Lot 6 Click Score: 16    End of Session Equipment Utilized During Treatment: Gait belt Activity Tolerance: Patient tolerated treatment well Patient left: with chair alarm set;in chair;with call bell/phone within reach Nurse Communication: Mobility status (recommend sittring < 2 hr due to pressure sores) PT Visit Diagnosis: Other abnormalities of gait and mobility (R26.89);Muscle weakness (generalized) (M62.81)    Time:  5400-8676 PT Time Calculation (min) (ACUTE ONLY): 30 min   Charges:   PT Evaluation $PT Eval Low Complexity: 1 Low PT Treatments $Therapeutic Activity: 8-22 mins        Abran Richard, PT Acute Rehab Services Pager [phone] [facility] [place] 06/14/2021, 4:28 PM

## 2021-06-14 NOTE — Progress Notes (Signed)
[facility] Progress Note Patient Name: Curtis Price. DOB: 1946/05/13 MRN: [id number]   Date of Service  06/14/2021  HPI/Events of Note  Admitted w/ septic shock possibly due to enterocutaneous fistula, central venous catheter infxn, or other  eICU Interventions  Not on vasopressors; stable in the ICU     Intervention Category Evaluation Type: New Patient Evaluation  [place] 06/14/2021, 1:45 AM

## 2021-06-14 NOTE — Progress Notes (Signed)
Patient was picked up by transport. No issues or complaints.

## 2021-06-14 NOTE — Consult Note (Signed)
[facility] for Infectious Disease    Date of Admission:  06/13/2021     Total days of antibiotics 2               Reason for Consult: MSSA Bacteremia   Referring Provider: [employer] Primary Care Provider: [name], MD   ASSESSMENT:  Curtis Price is a 75 y/o caucasian male admitted with fever and generalized weakness in the setting of history of mucinous adenocarcinoma of appendix s/p colectomy, splenectomy, omenectomy, cholecystecomy, and colostomy and recent admission to outside hospital with RLQ abscess/cellulitis found to have MSSA bacteremia. Suspect PICC line to be the most likely source of infection. Nursing to remove PICC line. Will repeat blood cultures after removal. Check TTE for evidence of endocarditis. Continue current dose of Cefazolin for MSSA and will add Ciprofloxacin/Flagyl which he was previously taking to cover abdominal abscess/cellulitis. Per nursing awaiting potential transfer to [facility]. ID will continue to follow.   PLAN:  Continue Cefazolin. Add Ciprofloxacin/Flagyl to cover abdominal abscess/cellulitis.  Need PICC removal and line holiday Repeat blood cultures for clearance of bacteremia. TTE to check for evidence of endocarditis.  Continue remaining supportive care per primary team.    Principal Problem:   Septic shock ([place]) Active Problems:   GERD   Obesity (BMI 30.0-34.9)   Enterocutaneous fistula   Abdominal wall abscess   DNR (do not resuscitate)    alteplase  2 mg Intracatheter Once   calcium carbonate  1 tablet Oral Once   Chlorhexidine Gluconate Cloth  6 each Topical Daily   famotidine (PEPCID) IV  20 mg Intravenous Q12H   heparin  5,000 Units Subcutaneous Q8H   mouth rinse  15 mL Mouth Rinse BID     HPI: Curtis Price. is a 75 y.o. male with previous medical history of mucinous adenocarcinoma of appendix s/p colectomy, splenectomy, omenectomy, cholecystecomy, and colostomy, chronic TPN, and  recent abdominal abscesses with anastomotic leaks and fistula presenting admitted with fever and general weakness after home health check earlier in the day.   Curtis Price has had several recent hospitalizations with the most recent at [facility] from 05/17/21-06/04/21 for RLQ abscess/cellulitis. A new enterocutaneous fistula was noted in his RLQ on 05/22/21 with drainage characteristic of his midline enterocutaneous fistula. He was started on IV vancomycin and piperacillin/tazobactam and transitioned to Augmentin on 9/6 with improving cellulitis and changed to Ciprofloxacin and Flagyl at discharge. Blood cultures were without growth.   Chest x-ray with persistent left basilar airspace disease and PICC line at the caval to atrial junction. CT abdomen/pelvis with extensive changes consistent with history of mucinous adenocarcinoma in the right lower quadrant; and presistent ventral wall lapartomy defect with small subcutaneous fluid collection previously measured at 6.5cm now approximately 2.8x2.7 cm. Started on broad spectrum coverage with vancomycin, cefepime and flagyl and admitted to the ICU.  Curtis Price was febrile on admission with temperature 102.4 F and WBC count of 50.5. One set of blood cultures is positive for MSSA. Antimicrobial therapy has been narrowed to Cefazolin.    Review of Systems: Review of Systems  Constitutional:  Negative for chills, fever and weight loss.  Respiratory:  Negative for cough, shortness of breath and wheezing.   Cardiovascular:  Negative for chest pain and leg swelling.  Gastrointestinal:  Negative for abdominal pain, constipation, diarrhea, nausea and vomiting.  Skin:  Negative for rash.    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 EF 60%, no ischemia or infarction. Echo 5/11 EF 60%, no significant valvular abnormalities, mild RV dilation and dysfunction, no complete TR doppler jet so PA pressure hard to gauge. 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 R heart cath 6/11: mean RA 10 mmHg, PA 25/18, mean PCWP 13 mmHg, CI 2.1   Fibromyalgia    GERD (gastroesophageal reflux disease)    History of epididymitis    History of head injury without skull fracture 1980   Concussion   Hx of adenomatous polyp of colon 06/19/2007   Hyperlipidemia    Myalgias with Lipitor   Hypertension    Hypogonadism male    Myofascial pain syndrome    Nephrolithiasis    Prolapsed internal hemorrhoids    Prostatitis    Recurrent   Rheumatic fever    x3 at ages [age] and 89; No significant valvular abnormality on 5/11 echo   Tendinitis 2000   Hx of left bicep   Vitamin D deficiency     Social History   Tobacco Use   Smoking status: Never   Smokeless tobacco: Never  Vaping Use   Vaping Use: Never used  Substance Use Topics   Alcohol use: Yes    Alcohol/week: 12.0 standard drinks    Types: 12 Cans of beer per week    Comment: beer and liquoer   Drug use: No    Family History  Problem Relation Age of Onset   Hypertension Mother    Breast cancer Mother    Aortic stenosis Father 53   Hypertension Other    Aortic stenosis Other    Breast cancer Other    Diabetes Other    Other Other        ASHD and epilepsy   Stroke Neg Hx    Colon cancer Neg Hx    Colon polyps Neg Hx    Esophageal cancer Neg Hx    Rectal cancer Neg Hx    Stomach cancer Neg Hx     Allergies  Allergen Reactions   Atenolol Other (See Comments)    Unknown reaction   Atorvastatin Other (See Comments)    Unknown reaction   Darvocet [Propoxyphene N-Acetaminophen] Other (See Comments)    Unknown reaction   Paroxetine Other (See Comments)    Unknown reaction   Pregabalin Other (See Comments)    Unknown reaction    Propoxyphene Hcl Other (See Comments)    Unknown reaction    OBJECTIVE: Blood pressure 120/65, pulse 100, temperature 99 F (37.2 C), temperature source Oral, resp. rate (!) 21, height 5\' 11"  (1.803 m), weight 85.9 kg, SpO2 99 %.  Physical Exam Constitutional:      General: He is not in acute distress.    Appearance: He is well-developed.  Cardiovascular:     Rate and Rhythm: Normal rate and regular rhythm.     Heart sounds: Normal heart sounds.  Pulmonary:     Effort: Pulmonary effort is normal.     Breath sounds: Normal breath sounds.  Skin:    General: Skin is warm and dry.  Neurological:     Mental Status: He is alert and oriented to person, place, and time.  Psychiatric:        Behavior: Behavior normal.  Thought Content: Thought content normal.        Judgment: Judgment normal.    Lab Results Lab Results  Component Value Date   WBC 50.5 (HH) 06/14/2021   HGB 8.3 (L) 06/14/2021   HCT 26.7 (L) 06/14/2021   MCV 81.7 06/14/2021   PLT 427 (H) 06/14/2021    Lab Results  Component Value Date   CREATININE 1.05 06/14/2021   BUN 26 (H) 06/14/2021   NA 132 (L) 06/14/2021   K 5.5 (H) 06/14/2021   CL 101 06/14/2021   CO2 17 (L) 06/14/2021    Lab Results  Component Value Date   ALT QUANTITY NOT SUFFICIENT, UNABLE TO PERFORM TEST 06/14/2021   AST 65 (H) 06/14/2021   ALKPHOS 165 (H) 06/14/2021   BILITOT 0.7 06/14/2021     Microbiology: Recent Results (from the past 240 hour(s))  Culture, blood (Routine x 2)     Status: None (Preliminary result)   Collection Time: 06/13/21  1:08 PM   Specimen: BLOOD RIGHT HAND  Result Value Ref Range Status   Specimen Description BLOOD RIGHT HAND  Final   Special Requests   Final    BOTTLES DRAWN AEROBIC AND ANAEROBIC Blood Culture adequate volume   Culture  Setup Time   Final    IN BOTH AEROBIC AND ANAEROBIC BOTTLES IN CLUSTERS CRITICAL RESULT CALLED TO, READ BACK BY AND VERIFIED WITH: C AMEND,PHARMD@0207  06/14/21  [place] Performed at [facility], [place] [address] [place], [place] [address]    Culture GRAM POSITIVE COCCI  Final   Report Status PENDING  Incomplete  Blood Culture ID Panel (Reflexed)     Status: Abnormal   Collection Time: 06/13/21  1:08 PM  Result Value Ref Range Status   Enterococcus faecalis NOT DETECTED NOT DETECTED Final   Enterococcus Faecium NOT DETECTED NOT DETECTED Final   Listeria monocytogenes NOT DETECTED NOT DETECTED Final   Staphylococcus species DETECTED (A) NOT DETECTED Final    Comment: CRITICAL RESULT CALLED TO, READ BACK BY AND VERIFIED WITH: C AMEND,PHARMD@0207  06/14/21 [place]    Staphylococcus aureus (BCID) DETECTED (A) NOT DETECTED Final    Comment: CRITICAL RESULT CALLED TO, READ BACK BY AND VERIFIED WITH: C AMEND,PHARMD@0210  06/14/21 [place]    Staphylococcus epidermidis NOT DETECTED NOT DETECTED Final   Staphylococcus lugdunensis NOT DETECTED NOT DETECTED Final   Streptococcus species NOT DETECTED NOT DETECTED Final   Streptococcus agalactiae NOT DETECTED NOT DETECTED Final   Streptococcus pneumoniae NOT DETECTED NOT DETECTED Final   Streptococcus pyogenes NOT DETECTED NOT DETECTED Final   A.calcoaceticus-baumannii NOT DETECTED NOT DETECTED Final   Bacteroides fragilis NOT DETECTED NOT DETECTED Final   Enterobacterales NOT DETECTED NOT DETECTED Final   Enterobacter cloacae complex NOT DETECTED NOT DETECTED Final   Escherichia coli NOT DETECTED NOT DETECTED Final   Klebsiella aerogenes NOT DETECTED NOT DETECTED Final   Klebsiella oxytoca NOT DETECTED NOT DETECTED Final   Klebsiella pneumoniae NOT DETECTED NOT DETECTED Final   Proteus species NOT DETECTED NOT DETECTED Final   Salmonella species NOT DETECTED NOT DETECTED Final   Serratia marcescens NOT DETECTED NOT DETECTED Final   Haemophilus influenzae NOT DETECTED NOT DETECTED Final   Neisseria meningitidis NOT DETECTED NOT DETECTED Final   Pseudomonas aeruginosa NOT DETECTED NOT DETECTED Final    Stenotrophomonas maltophilia NOT DETECTED NOT DETECTED Final   Candida albicans NOT DETECTED NOT DETECTED Final   Candida auris NOT DETECTED NOT DETECTED Final   Candida glabrata NOT DETECTED NOT DETECTED Final  Candida krusei NOT DETECTED NOT DETECTED Final   Candida parapsilosis NOT DETECTED NOT DETECTED Final   Candida tropicalis NOT DETECTED NOT DETECTED Final   Cryptococcus neoformans/gattii NOT DETECTED NOT DETECTED Final   Meth resistant mecA/C and MREJ NOT DETECTED NOT DETECTED Final    Comment: Performed at [facility], [place] [address], [place], [place] [address]  Culture, blood (Routine x 2)     Status: None (Preliminary result)   Collection Time: 06/13/21  8:59 PM   Specimen: BLOOD  Result Value Ref Range Status   Specimen Description BLOOD SITE NOT SPECIFIED  Final   Special Requests   Final    BOTTLES DRAWN AEROBIC AND ANAEROBIC Blood Culture adequate volume   Culture   Final    NO GROWTH < 12 HOURS Performed at [facility], [place] [address], [place], [place] [address]    Report Status PENDING  Incomplete  Resp Panel by RT-PCR (Flu A&B, Covid) Nasopharyngeal Swab     Status: None   Collection Time: 06/13/21  9:30 PM   Specimen: Nasopharyngeal Swab; Nasopharyngeal(NP) swabs in vial transport medium  Result Value Ref Range Status   SARS Coronavirus 2 by RT PCR NEGATIVE NEGATIVE Final    Comment: (NOTE) SARS-CoV-2 target nucleic acids are NOT DETECTED.  The SARS-CoV-2 RNA is generally detectable in upper respiratory specimens during the acute phase of infection. The lowest concentration of SARS-CoV-2 viral copies this assay can detect is 138 copies/mL. A negative result does not preclude SARS-Cov-2 infection and should not be used as the sole basis for treatment or other patient management decisions. A negative result may occur with  improper specimen collection/handling, submission of specimen other than nasopharyngeal swab, presence of viral mutation(s)  within the areas targeted by this assay, and inadequate number of viral copies(<138 copies/mL). A negative result must be combined with clinical observations, patient history, and epidemiological information. The expected result is Negative.  Fact Sheet for Patients:  EntrepreneurPulse.com.au  Fact Sheet for Healthcare Providers:  IncredibleEmployment.be  This test is no t yet approved or cleared by the [place] FDA and  has been authorized for detection and/or diagnosis of SARS-CoV-2 by FDA under an Emergency Use Authorization (EUA). This EUA will remain  in effect (meaning this test can be used) for the duration of the COVID-19 declaration under Section 564(b)(1) of the Act, 21 U.S.C.section 360bbb-3(b)(1), unless the authorization is terminated  or revoked sooner.       Influenza A by PCR NEGATIVE NEGATIVE Final   Influenza B by PCR NEGATIVE NEGATIVE Final    Comment: (NOTE) The Xpert Xpress SARS-CoV-2/FLU/RSV plus assay is intended as an aid in the diagnosis of influenza from Nasopharyngeal swab specimens and should not be used as a sole basis for treatment. Nasal washings and aspirates are unacceptable for Xpert Xpress SARS-CoV-2/FLU/RSV testing.  Fact Sheet for Patients: EntrepreneurPulse.com.au  Fact Sheet for Healthcare Providers: IncredibleEmployment.be  This test is not yet approved or cleared by the [place] FDA and has been authorized for detection and/or diagnosis of SARS-CoV-2 by FDA under an Emergency Use Authorization (EUA). This EUA will remain in effect (meaning this test can be used) for the duration of the COVID-19 declaration under Section 564(b)(1) of the Act, 21 U.S.C. section 360bbb-3(b)(1), unless the authorization is terminated or revoked.  Performed at [facility], [place] [address] [place], [place] [address]   MRSA Next Gen by PCR, Nasal     Status: None  Collection Time: 06/14/21 12:37 AM   Specimen: Nasal Mucosa; Nasal Swab  Result Value Ref Range Status   MRSA by PCR Next Gen NOT DETECTED NOT DETECTED Final    Comment: (NOTE) The GeneXpert MRSA Assay (FDA approved for NASAL specimens only), is one component of a comprehensive MRSA colonization surveillance program. It is not intended to diagnose MRSA infection nor to guide or monitor treatment for MRSA infections. Test performance is not FDA approved in patients less than 72 years old. Performed at [facility], [place] [address], [place], [place] [address]      Terri Piedra, [place] for Infectious Disease [facility]  06/14/2021  8:39 AM

## 2021-06-14 NOTE — Progress Notes (Addendum)
PHARMACY - TOTAL PARENTERAL NUTRITION CONSULT NOTE   Indication: Fistula  Patient Measurements: Height: 5\' 11"  (180.3 cm) Weight: 85.9 kg (189 lb 6 oz) IBW/kg (Calculated) : 75.3 TPN AdjBW (KG): 85.8 Body mass index is 26.41 kg/m.   Assessment: 75 years of age male with a history of low-grade appendiceal mucinous neoplasm in April 1308 complicated by a leak and development of an enterocutaneous fistula. During recent admission to [facility] in August 2022, patient had PICC line exchange.Patient was receiving TPN at home.  Patient brought to the ED 9/20 for fever, tachycardia, and sepsis. Enterocutaneous fistula with feculent drainage. Patient has not had weight loss or nausea/vomiting/diarrhea.   Home TPN (compounded by [employer]):  3-in-1 2000 mL over 24 hours at rate of 125 mL/hr providing Amino acid 123 grams/day, Dextrose 409 grams/day, Lipids 66 grams/day providing 2542 kcals/day.  Electrolytes were as follows: Sodlum acetate 56 mEq, Sodium Chloride 150mEq, Potassium Phosphate 50 mmol, Potassium chloride 13 mEq, Calcium gluconate 6 mEq, Magnesium sulfate 33 mEq.  Additives were as follows: Tralement 1 mL and Multivitamin 10 mL.   Glucose / Insulin: CBG 92 Electrolytes: K up 5.5, Na low at 132, CO2 low at 17, Mg down 1.5 - 4g given, Phos on upper end of normal, Corrected Ca 10.3 Renal: SCr up 1.05, BUN up 26 Hepatic: AST up 65, AlkPhos up 165, Tbili 0.7 Intake / Output; MIVF: LR at 150 ml/hr. UOP 0.8 ml/kg/hr, Colostomy output 450 GI Imaging: 9/19 CT A/P: Extensive changes consistent with mucinous adenocarcinoma in the right lower quadrant;  correlate with any current fistulization; persistent ventral wall laparotomy defect with a small subcutaneous fluid collection as described significantly reduced when compared with the prior exam  GI Surgeries / Procedures:   Central access: PICC removed  9/20 TPN start date: 06/14/21  Nutritional Goals: Goal TPN rate is 125  mL/hr (provides 123 g of protein and 2542 kcals per day)  RD Assessment:  2400-2600 kcal 120-145 g protein  Current Nutrition:  NPO and TPN  Plan:  Holding TPN and PICC line removal 06/14/21 due to positive blood cultures. Currently running D10 infusion at 50 ml/hr - monitor CBGs, may need to increase rate. Communicated with unit-based pharmacist.  Follow-up plans for access and ability to restart TPN.   Sloan Leiter, PharmD, BCPS, BCCCP Clinical Pharmacist Please refer to [facility] for [place] numbers 06/14/2021,7:25 AM

## 2021-06-14 NOTE — Progress Notes (Signed)
PHARMACY - PHYSICIAN COMMUNICATION CRITICAL VALUE ALERT - BLOOD CULTURE IDENTIFICATION (BCID)  Curtis Price. is an 75 y.o. male who presented to [facility] on 06/13/2021 with a chief complaint of septic shock  Assessment:  Pt with MSSA bacteremia   Name of physician (or Provider) Contacted: Dr. Salley Slaughter  Current antibiotics: Cefepime and Vancomycin  Changes to prescribed antibiotics recommended:  Narrow antibiotics to cefazolin 2gm IV q8h. ID will be automatically consulted.  Results for orders placed or performed during the hospital encounter of 06/13/21  Blood Culture ID Panel (Reflexed) (Collected: 06/13/2021  1:08 PM)  Result Value Ref Range   Enterococcus faecalis NOT DETECTED NOT DETECTED   Enterococcus Faecium NOT DETECTED NOT DETECTED   Listeria monocytogenes NOT DETECTED NOT DETECTED   Staphylococcus species DETECTED (A) NOT DETECTED   Staphylococcus aureus (BCID) DETECTED (A) NOT DETECTED   Staphylococcus epidermidis NOT DETECTED NOT DETECTED   Staphylococcus lugdunensis NOT DETECTED NOT DETECTED   Streptococcus species NOT DETECTED NOT DETECTED   Streptococcus agalactiae NOT DETECTED NOT DETECTED   Streptococcus pneumoniae NOT DETECTED NOT DETECTED   Streptococcus pyogenes NOT DETECTED NOT DETECTED   A.calcoaceticus-baumannii NOT DETECTED NOT DETECTED   Bacteroides fragilis NOT DETECTED NOT DETECTED   Enterobacterales NOT DETECTED NOT DETECTED   Enterobacter cloacae complex NOT DETECTED NOT DETECTED   Escherichia coli NOT DETECTED NOT DETECTED   Klebsiella aerogenes NOT DETECTED NOT DETECTED   Klebsiella oxytoca NOT DETECTED NOT DETECTED   Klebsiella pneumoniae NOT DETECTED NOT DETECTED   Proteus species NOT DETECTED NOT DETECTED   Salmonella species NOT DETECTED NOT DETECTED   Serratia marcescens NOT DETECTED NOT DETECTED   Haemophilus influenzae NOT DETECTED NOT DETECTED   Neisseria meningitidis NOT DETECTED NOT DETECTED   Pseudomonas aeruginosa NOT DETECTED  NOT DETECTED   Stenotrophomonas maltophilia NOT DETECTED NOT DETECTED   Candida albicans NOT DETECTED NOT DETECTED   Candida auris NOT DETECTED NOT DETECTED   Candida glabrata NOT DETECTED NOT DETECTED   Candida krusei NOT DETECTED NOT DETECTED   Candida parapsilosis NOT DETECTED NOT DETECTED   Candida tropicalis NOT DETECTED NOT DETECTED   Cryptococcus neoformans/gattii NOT DETECTED NOT DETECTED   Meth resistant mecA/C and MREJ NOT DETECTED NOT DETECTED    Sherlon Handing, PharmD, BCPS Please see amion for complete clinical pharmacist phone list 06/14/2021  2:37 AM

## 2021-06-14 NOTE — Progress Notes (Signed)
[facility] ADULT ICU REPLACEMENT PROTOCOL   The patient does apply for the [facility] Adult ICU Electrolyte Replacment Protocol based on the criteria listed below:   1.Exclusion criteria: TCTS patients, ECMO patients and Hypothermia Protocol, and   Dialysis patients 2. Is GFR >/= 30 ml/min? Yes.    Patient's GFR today is >60 3. Is SCr </= 2? Yes.   Patient's SCr is 1.05 mg/dL 4. Did SCr increase >/= 0.5 in 24 hours? No. 5.Pt's weight >40kg  Yes.   6. Abnormal electrolyte(s): mag 1.5  7. Electrolytes replaced per protocol 8.  Call MD STAT for K+ </= 2.5, Phos </= 1, or Mag </= 1 Physician:    Ronda Fairly A 06/14/2021 4:11 AM

## 2021-06-14 NOTE — Progress Notes (Signed)
  Echocardiogram 2D Echocardiogram has been performed.  Curtis Price F 06/14/2021, 3:30 PM

## 2021-06-14 NOTE — Progress Notes (Signed)
NAME:  Curtis Price. MRN:  [id number] DOB:  [birth year] LOS: 1 ADMISSION DATE:  06/13/2021 DATE OF SERVICE:  06/13/2021  CHIEF COMPLAINT:  hypotension   HISTORY & PHYSICAL  History of Present Illness  This 75 y.o. Caucasian male presented to the [facility] Emergency Department via EMS with complaints of fever. He started feeling ill yesterday and reportedly was febrile, generally weak.  Home health check today found the patient to be febrile and tachycardic, without improveement after acetaminophen.  He was advised to [facility] to ER.  In the ER, the patinet has been started on cefepime/Flagylvancomycin to empirically cover for abdominal sepsis.  He received 3 L LR IV bolus followed by infusion at 150 mL/hr.  His blood pressure has improved.  He has a past history of mucinous adenocarcinoma of appendix s/p colectomy, splenectomy, omentectomy, cholecystectomy, colostomy.  He subsequently developed malnutrition, requiring chronic TPN.  He has a known enterocutaneous fistula.  Also, he has a L PICC line in his LUE (unknown placement date at this time).  He was admitted to this facility in 04/15/2021, which resulted in transfer to [facility] on 04/17/2021.  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  Past Medical/Surgical/Social/Family History   Past Medical History:  Diagnosis Date   Allergy    BPH (benign prostatic hypertrophy)    Elevated serum immunoglobulin free light chains 04/28/2014   Erythrocytosis 04/28/2014   Exertional dyspnea    ETT myoview 5/11: 8 min, no ischemic ECG changes, stopped due to fatigue/ SOB. EF 60%, no ischemia or infarction. Echo 5/11 EF 60%, no significant valvular abnormalities, mild RV dilation and dysfunction, no complete TR doppler jet so PA pressure hard to gauge. 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 R heart cath 6/11: mean  RA 10 mmHg, PA 25/18, mean PCWP 13 mmHg, CI 2.1   Fibromyalgia    GERD (gastroesophageal reflux disease)    History of epididymitis    History of head injury without skull fracture 1980   Concussion   Hx of adenomatous polyp of colon 06/19/2007   Hyperlipidemia    Myalgias with Lipitor   Hypertension    Hypogonadism male    Myofascial pain syndrome    Nephrolithiasis    Prolapsed internal hemorrhoids    Prostatitis    Recurrent   Rheumatic fever    x3 at ages [age] and 41; No significant valvular abnormality on 5/11 echo   Tendinitis 2000   Hx of left bicep   Vitamin D deficiency     Past Surgical History:  Procedure Laterality Date   BIH REPAIR  08/2005   Incarverated fat by mesh repair   CARDIAC CATHETERIZATION  02/2010   Left and right   COLONOSCOPY  06/09/2015   [facility]   FINGER SURGERY  2000   Repair of injury to right fifth finger   HEEL SPUR SURGERY  1960   Bilateral   TONSILLECTOMY  1962    Social History   Tobacco Use   Smoking status: Never   Smokeless tobacco: Never  Substance Use Topics   Alcohol use: Yes    Alcohol/week: 12.0 standard drinks    Types: 12 Cans of beer per week    Comment: beer and liquoer    Family History  Problem Relation Age of Onset   Hypertension Mother    Breast cancer Mother  Aortic stenosis Father 36   Hypertension Other    Aortic stenosis Other    Breast cancer Other    Diabetes Other    Other Other        ASHD and epilepsy   Stroke Neg Hx    Colon cancer Neg Hx    Colon polyps Neg Hx    Esophageal cancer Neg Hx    Rectal cancer Neg Hx    Stomach cancer Neg Hx      Procedures:     Significant Diagnostic Tests:     Micro Data:   Results for orders placed or performed during the hospital encounter of 06/13/21  Culture, blood (Routine x 2)     Status: None (Preliminary result)   Collection Time: 06/13/21  1:08 PM   Specimen: BLOOD RIGHT HAND  Result Value Ref Range Status   Specimen Description  BLOOD RIGHT HAND  Final   Special Requests   Final    BOTTLES DRAWN AEROBIC AND ANAEROBIC Blood Culture adequate volume   Culture  Setup Time   Final    IN BOTH AEROBIC AND ANAEROBIC BOTTLES IN CLUSTERS CRITICAL RESULT CALLED TO, READ BACK BY AND VERIFIED WITH: C AMEND,[email or web address]@[email or web address]  06/14/21 [place] Performed at [facility], [address], [place], [place] [address]    Culture GRAM POSITIVE COCCI  Final   Report Status PENDING  Incomplete  Blood Culture ID Panel (Reflexed)     Status: Abnormal   Collection Time: 06/13/21  1:08 PM  Result Value Ref Range Status   Enterococcus faecalis NOT DETECTED NOT DETECTED Final   Enterococcus Faecium NOT DETECTED NOT DETECTED Final   Listeria monocytogenes NOT DETECTED NOT DETECTED Final   Staphylococcus species DETECTED (A) NOT DETECTED Final    Comment: CRITICAL RESULT CALLED TO, READ BACK BY AND VERIFIED WITH: C AMEND,[email or web address]@[email or web address]  06/14/21 [place]    Staphylococcus aureus (BCID) DETECTED (A) NOT DETECTED Final    Comment: CRITICAL RESULT CALLED TO, READ BACK BY AND VERIFIED WITH: C AMEND,[email or web address]  06/14/21 [place]    Staphylococcus epidermidis NOT DETECTED NOT DETECTED Final   Staphylococcus lugdunensis NOT DETECTED NOT DETECTED Final   Streptococcus species NOT DETECTED NOT DETECTED Final   Streptococcus agalactiae NOT DETECTED NOT DETECTED Final   Streptococcus pneumoniae NOT DETECTED NOT DETECTED Final   Streptococcus pyogenes NOT DETECTED NOT DETECTED Final   A.calcoaceticus-baumannii NOT DETECTED NOT DETECTED Final   Bacteroides fragilis NOT DETECTED NOT DETECTED Final   Enterobacterales NOT DETECTED NOT DETECTED Final   Enterobacter cloacae complex NOT DETECTED NOT DETECTED Final   Escherichia coli NOT DETECTED NOT DETECTED Final   Klebsiella aerogenes NOT DETECTED NOT DETECTED Final   Klebsiella oxytoca NOT DETECTED NOT DETECTED Final   Klebsiella pneumoniae NOT DETECTED NOT DETECTED Final   Proteus species NOT DETECTED NOT DETECTED Final    Salmonella species NOT DETECTED NOT DETECTED Final   Serratia marcescens NOT DETECTED NOT DETECTED Final   Haemophilus influenzae NOT DETECTED NOT DETECTED Final   Neisseria meningitidis NOT DETECTED NOT DETECTED Final   Pseudomonas aeruginosa NOT DETECTED NOT DETECTED Final   Stenotrophomonas maltophilia NOT DETECTED NOT DETECTED Final   Candida albicans NOT DETECTED NOT DETECTED Final   Candida auris NOT DETECTED NOT DETECTED Final   Candida glabrata NOT DETECTED NOT DETECTED Final   Candida krusei NOT DETECTED NOT DETECTED Final   Candida parapsilosis NOT DETECTED NOT DETECTED Final   Candida tropicalis NOT DETECTED NOT DETECTED Final   Cryptococcus neoformans/gattii NOT DETECTED NOT DETECTED  Final   Meth resistant mecA/C and MREJ NOT DETECTED NOT DETECTED Final    Comment: Performed at [facility], [place] [address] [place], [place] [address]  Culture, blood (Routine x 2)     Status: None (Preliminary result)   Collection Time: 06/13/21  8:59 PM   Specimen: BLOOD  Result Value Ref Range Status   Specimen Description BLOOD SITE NOT SPECIFIED  Final   Special Requests   Final    BOTTLES DRAWN AEROBIC AND ANAEROBIC Blood Culture adequate volume   Culture   Final    NO GROWTH < 12 HOURS Performed at [facility], [place] [address], [place], [place] [address]    Report Status PENDING  Incomplete  Resp Panel by RT-PCR (Flu A&B, Covid) Nasopharyngeal Swab     Status: None   Collection Time: 06/13/21  9:30 PM   Specimen: Nasopharyngeal Swab; Nasopharyngeal(NP) swabs in vial transport medium  Result Value Ref Range Status   SARS Coronavirus 2 by RT PCR NEGATIVE NEGATIVE Final    Comment: (NOTE) SARS-CoV-2 target nucleic acids are NOT DETECTED.  The SARS-CoV-2 RNA is generally detectable in upper respiratory specimens during the acute phase of infection. The lowest concentration of SARS-CoV-2 viral copies this assay can detect is 138 copies/mL. A negative result does not  preclude SARS-Cov-2 infection and should not be used as the sole basis for treatment or other patient management decisions. A negative result may occur with  improper specimen collection/handling, submission of specimen other than nasopharyngeal swab, presence of viral mutation(s) within the areas targeted by this assay, and inadequate number of viral copies(<138 copies/mL). A negative result must be combined with clinical observations, patient history, and epidemiological information. The expected result is Negative.  Fact Sheet for Patients:  EntrepreneurPulse.com.au  Fact Sheet for Healthcare Providers:  IncredibleEmployment.be  This test is no t yet approved or cleared by the [place] FDA and  has been authorized for detection and/or diagnosis of SARS-CoV-2 by FDA under an Emergency Use Authorization (EUA). This EUA will remain  in effect (meaning this test can be used) for the duration of the COVID-19 declaration under Section 564(b)(1) of the Act, 21 U.S.C.section 360bbb-3(b)(1), unless the authorization is terminated  or revoked sooner.       Influenza A by PCR NEGATIVE NEGATIVE Final   Influenza B by PCR NEGATIVE NEGATIVE Final    Comment: (NOTE) The Xpert Xpress SARS-CoV-2/FLU/RSV plus assay is intended as an aid in the diagnosis of influenza from Nasopharyngeal swab specimens and should not be used as a sole basis for treatment. Nasal washings and aspirates are unacceptable for Xpert Xpress SARS-CoV-2/FLU/RSV testing.  Fact Sheet for Patients: EntrepreneurPulse.com.au  Fact Sheet for Healthcare Providers: IncredibleEmployment.be  This test is not yet approved or cleared by the [place] FDA and has been authorized for detection and/or diagnosis of SARS-CoV-2 by FDA under an Emergency Use Authorization (EUA). This EUA will remain in effect (meaning this test can be used) for the  duration of the COVID-19 declaration under Section 564(b)(1) of the Act, 21 U.S.C. section 360bbb-3(b)(1), unless the authorization is terminated or revoked.  Performed at [facility], [place] [address], [place], [place] [address]   MRSA Next Gen by PCR, Nasal     Status: None   Collection Time: 06/14/21 12:37 AM   Specimen: Nasal Mucosa; Nasal Swab  Result Value Ref Range Status   MRSA by PCR Next Gen NOT DETECTED NOT DETECTED Final  Comment: (NOTE) The GeneXpert MRSA Assay (FDA approved for NASAL specimens only), is one component of a comprehensive MRSA colonization surveillance program. It is not intended to diagnose MRSA infection nor to guide or monitor treatment for MRSA infections. Test performance is not FDA approved in patients less than 70 years old. Performed at [facility], [place] [address], [place],  [address]       Antimicrobials:  Cefepime/Flagyl/vancomycin (9/19>>)    Interim history/subjective:   Reports a headache. No other complaints. No pressors.   Objective   BP 120/65   Pulse 100   Temp 99 F (37.2 C) (Oral)   Resp (!) 21   Ht 5\' 11"  (1.803 m)   Wt 85.9 kg   SpO2 99%   BMI 26.41 kg/m     Filed Weights   06/14/21 0037 06/14/21 0500  Weight: 85.8 kg 85.9 kg    Intake/Output Summary (Last 24 hours) at 06/14/2021 0847 Last data filed at 06/14/2021 0700 Gross per 24 hour  Intake 2634.71 ml  Output 1525 ml  Net 1109.71 ml       REVIEW OF SYSTEMS Constitutional: Fever, malaise. Generalized weakness.  No weight loss. No night sweats. No chills.  HEENT: Headache. No dysphagia, sore throat, otalgia, nasal congestion, PND CV:  No chest pain, orthopnea, PND, swelling in lower extremities, palpitations GI:  Enterocutaneous fistula with feculent drainage.  No abdominal pain, nausea, vomiting, diarrhea, change in bowel pattern, anorexia Resp: No DOE, rest dyspnea, cough, mucus, hemoptysis, wheezing  GU: no dysuria, change in  color of urine, no urgency or frequency.  No flank pain. MS:  No joint pain or swelling. No myalgias,  No decreased range of motion.  Psych:  No change in mood or affect. No memory loss. Skin: no rash or lesions.  Examination: GENERAL:  Caucasian male, pleasant, well-developed. No acute distress. HEAD: normocephalic, atraumatic EYE: PERRLA, EOM intact, no scleral icterus, no pallor. NOSE: nares are patent. No exudate.  THROAT/ORAL CAVITY: Normal dentition. Mucous membranes are moist.  CHEST/LUNG: symmetric in development and expansion. Good air entry. No crackles. No wheezes. Tachypnea.  HEART: Regular S1 and S2 without murmur, rub or gallop. Tachycardia. ABDOMEN: soft, nontender, nondistended. Normoactive bowel sounds. No rebound. No guarding. No hepatosplenomegaly. Enterocutaneous fistula with feculent drainage. EXTREMITIES: L UE PICC in situ without overt erythema or purulence or fluctuance. Edema: Trace. No cyanosis. 2+ DP pulses LYMPHATIC: no cervical/axillary/inguinal lymph nodes appreciated MUSCULOSKELETAL: No point tenderness. No bulk atrophy. Joints: normal inspection.  SKIN:  No rash or lesion. NEUROLOGIC: alert, oriented to time, person and place. Moves all 4 extremities spontaneously.   Resolved Hospital Problem list      Assessment & Plan:   ASSESSMENT/PLAN:  ASSESSMENT (included in the Hospital Problem List)  Principal Problem:   Septic shock ([place]) Active Problems:   GERD   Obesity (BMI 30.0-34.9)   Enterocutaneous fistula   Abdominal wall abscess   DNR (do not resuscitate)   By systems: PULMONARY: No acute issues Titrate supplemental oxygen to maintain SpO2 93+%   CARDIOVASCULAR Shock, likely septic Hypotension Tachycardia Continue IV fluids: LR @ 150 mL/hr Start norepinephrine/vasopressin as needed for MAP < 65. 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, splenectomy, omentectomy, cholecystectomy, colostomy Continue TPN OK to supplement with regular PO diet GI PROPHYLAXIS: famotidine   HEMATOLOGIC LUE PICC line in situ May need to consider  line holiday and new PICC placement if blood cultures are positive DVT PROPHYLAXIS: heparin   INFECTIOUS Sepsis/septic shock, unclear etiology: abdominal vs line vs other Follow up blood cultures Continue empiric cefepime/Flagyl/vancomcyin for now   ENDOCRINE: No acute issues   NEUROLOGIC: No acute issues   PLAN/RECOMMENDATIONS  Transfer to med/surg unit with the diagnoses highlighted above in the active Hospital Problem List (ASSESSMENT). IV fluids: NS bolus 1 L followed by LR @ 150 mL/hr Meds: continue antibiotics, Tylenol 650mg  solution Q6H PRN pain/headache Labs: trend lactate, CMP, Mg, Phos, TGs in AM NUTRITION: TPN and PO nutrition as tolerated Mobility: PT/OT    My assessment, plan of care, findings, medications, side effects, etc. were discussed with: patient (answered all questions to patient's satisfaction).   Best practice:  Diet: TPN + PO regular Pain/Anxiety/Delirium protocol (if indicated): N/A VAP protocol (if indicated): N/A DVT prophylaxis: heparin GI prophylaxis: famotidine Glucose control: N/A Mobility/Activity: bedrest   Code Status: DNR Family Communication:   no family at bedside .  Disposition: TBD   Labs   CBC: Recent Labs  Lab 06/13/21 1318 06/14/21 0126  WBC 47.6* 50.5*  NEUTROABS 46.6*  --   HGB 9.5* 8.3*  HCT 30.9* 26.7*  MCV 82.0 81.7  PLT 414* 427*     Basic Metabolic Panel: Recent Labs  Lab 06/13/21 1318 06/14/21 0126 06/14/21 0322  NA 134* 132*  --   K 4.2 5.5*  --   CL 103 101  --   CO2 21* 17*  --   GLUCOSE 92 92  --   BUN 22 26*  --   CREATININE 0.89 1.05  --   CALCIUM 8.5* 8.7*  --   MG  --   --  1.5*  PHOS  --   --  4.6    GFR: Estimated Creatinine  Clearance: 65.7 mL/min (by C-G formula based on SCr of 1.05 mg/dL). Recent Labs  Lab 06/13/21 1318 06/13/21 1518 06/14/21 0125 06/14/21 0126  WBC 47.6*  --   --  50.5*  LATICACIDVEN 2.3* 2.8* 2.3*  --      Liver Function Tests: Recent Labs  Lab 06/13/21 1318 06/14/21 0126 06/14/21 0322  AST 56* 65*  --   ALT 35 QUANTITY NOT SUFFICIENT, UNABLE TO PERFORM TEST  --   ALKPHOS 143* 165*  --   BILITOT 0.9 QUANTITY NOT SUFFICIENT, UNABLE TO PERFORM TEST 0.7  PROT 6.9 6.1*  --   ALBUMIN 2.2* 2.0*  --     No results for input(s): LIPASE, AMYLASE in the last 168 hours. No results for input(s): AMMONIA in the last 168 hours.  ABG    Component Value Date/Time   PHART 7.374 02/25/2010 0825   PCO2ART 45.1 (H) 02/25/2010 0825   PO2ART 81.0 02/25/2010 0825   HCO3 26.3 (H) 02/25/2010 0825   TCO2 28 02/25/2010 0825   O2SAT 96.0 02/25/2010 0825      Coagulation Profile: Recent Labs  Lab 06/13/21 1318  INR 1.2     Cardiac Enzymes: No results for input(s): CKTOTAL, CKMB, CKMBINDEX, TROPONINI in the last 168 hours.  HbA1C: Hgb A1c MFr Bld  Date/Time Value Ref Range Status  10/08/2020 10:21 AM 5.3 <5.7 % of total Hgb Final    Comment:    For the purpose of screening for the presence of diabetes: . <5.7%       Consistent with the absence of diabetes 5.7-6.4%    Consistent with increased risk for diabetes             (  prediabetes) > or =6.5%  Consistent with diabetes . This assay result is consistent with a decreased risk of diabetes. . Currently, no consensus exists regarding use of hemoglobin A1c for diagnosis of diabetes in children. . According to American Diabetes Association (ADA) guidelines, hemoglobin A1c <7.0% represents optimal control in non-pregnant diabetic patients. Different metrics may apply to specific patient populations.  Standards of Medical Care in Diabetes(ADA). Marland Kitchen   03/19/2020 10:11 AM 5.3 <5.7 % of total Hgb Final    Comment:    For the  purpose of screening for the presence of diabetes: . <5.7%       Consistent with the absence of diabetes 5.7-6.4%    Consistent with increased risk for diabetes             (prediabetes) > or =6.5%  Consistent with diabetes . This assay result is consistent with a decreased risk of diabetes. . Currently, no consensus exists regarding use of hemoglobin A1c for diagnosis of diabetes in children. . According to American Diabetes Association (ADA) guidelines, hemoglobin A1c <7.0% represents optimal control in non-pregnant diabetic patients. Different metrics may apply to specific patient populations.  Standards of Medical Care in Diabetes(ADA). .     CBG: No results for input(s): GLUCAP in the last 168 hours.   Past Medical History   Past Medical History:  Diagnosis Date   Allergy    BPH (benign prostatic hypertrophy)    Elevated serum immunoglobulin free light chains 04/28/2014   Erythrocytosis 04/28/2014   Exertional dyspnea    ETT myoview 5/11: 8 min, no ischemic ECG changes, stopped due to fatigue/ SOB. EF 60%, no ischemia or infarction. Echo 5/11 EF 60%, no significant valvular abnormalities, mild RV dilation and dysfunction, no complete TR doppler jet so PA pressure hard to gauge. 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 R heart cath 6/11: mean RA 10 mmHg, PA 25/18, mean PCWP 13 mmHg, CI 2.1   Fibromyalgia    GERD (gastroesophageal reflux disease)    History of epididymitis    History of head injury without skull fracture 1980   Concussion   Hx of adenomatous polyp of colon 06/19/2007   Hyperlipidemia    Myalgias with Lipitor   Hypertension    Hypogonadism male    Myofascial pain syndrome    Nephrolithiasis    Prolapsed internal hemorrhoids    Prostatitis    Recurrent   Rheumatic fever    x3 at ages [age] and 5; No significant valvular abnormality on 5/11 echo   Tendinitis 2000   Hx of left bicep   Vitamin D deficiency       Surgical  History    Past Surgical History:  Procedure Laterality Date   BIH REPAIR  08/2005   Incarverated fat by mesh repair   CARDIAC CATHETERIZATION  02/2010   Left and right   COLONOSCOPY  06/09/2015   [facility]   FINGER SURGERY  2000   Repair of injury to right fifth finger   HEEL SPUR SURGERY  1960   Bilateral   TONSILLECTOMY  1962      Social History   Social History   Socioeconomic History   Marital status: Married    Spouse name: Not on file   Number of children: Not on file   Years of education: Not on file   Highest education level: Not on file  Occupational History   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    Employer: OTHER    Comment: Full time  Tobacco Use   Smoking status: Never   Smokeless tobacco: Never  Vaping Use   Vaping Use: Never used  Substance and Sexual Activity   Alcohol use: Yes    Alcohol/week: 12.0 standard drinks    Types: 12 Cans of beer per week    Comment: beer and liquoer   Drug use: No   Sexual activity: Not on file  Other Topics Concern   Not on file  Social History Narrative   Married, lives in [place] Determinants of Health   Financial Resource Strain: Not on file  Food Insecurity: Not on file  Transportation Needs: Not on file  Physical Activity: Not on file  Stress: Not on file  Social Connections: Not on file      Family History    Family History  Problem Relation Age of Onset   Hypertension Mother    Breast cancer Mother    Aortic stenosis Father 60   Hypertension Other    Aortic stenosis Other    Breast cancer Other    Diabetes Other    Other Other        ASHD and epilepsy   Stroke Neg Hx    Colon cancer Neg Hx    Colon polyps Neg Hx    Esophageal cancer Neg Hx    Rectal cancer Neg Hx    Stomach cancer Neg Hx    family history includes Aortic stenosis in an other family member; Aortic stenosis (age of onset: 28) in his father; Breast cancer in his mother  and another family member; Diabetes in an other family member; Hypertension in his mother and another family member; Other in an other family member. There is no history of Stroke, Colon cancer, Colon polyps, Esophageal cancer, Rectal cancer, or Stomach cancer.    Allergies Allergies  Allergen Reactions   Atenolol Other (See Comments)    Unknown reaction   Atorvastatin Other (See Comments)    Unknown reaction   Darvocet [Propoxyphene N-Acetaminophen] Other (See Comments)    Unknown reaction   Paroxetine Other (See Comments)    Unknown reaction   Pregabalin Other (See Comments)    Unknown reaction   Propoxyphene Hcl Other (See Comments)    Unknown reaction      Current Medications  Current Facility-Administered Medications:    0.9 %  sodium chloride infusion, 250 mL, Intravenous, Continuous, Renee Pain, MD   acetaminophen (TYLENOL) tablet 650 mg, 650 mg, Oral, Q4H PRN, Renee Pain, MD   alteplase (CATHFLO ACTIVASE) injection 2 mg, 2 mg, Intracatheter, Once, Charlesetta Shanks, MD   calcium carbonate (TUMS - dosed in mg elemental calcium) chewable tablet 200 mg of elemental calcium, 1 tablet, Oral, Once, Wynona Dove A, DO   ceFAZolin (ANCEF) IVPB 2g/100 mL premix, 2 g, Intravenous, Q8H, Michael, Dinkels, MD, Last Rate: 200 mL/hr at 06/14/21 0310, 2 g at 06/14/21 0310   Chlorhexidine Gluconate Cloth 2 % PADS 6 each, 6 each, Topical, Daily, Wynona Dove A, DO   docusate sodium (COLACE) capsule 100 mg, 100 mg, Oral, BID PRN, Renee Pain, MD   famotidine (PEPCID) IVPB 20 mg in NS 100 mL IVPB, 20 mg, Intravenous, Q12H, Renee Pain, MD, Stopped at 06/14/21 0307   heparin injection 5,000 Units, 5,000 Units, Subcutaneous, Q8H, Renee Pain, MD, 5,000 Units at 06/14/21 0528   lactated ringers infusion, , Intravenous, Continuous, Pfeiffer, Marcy, MD, Last Rate: 150 mL/hr  at 06/14/21 0600, Infusion Verify at 06/14/21 0600   MEDLINE mouth rinse, 15 mL, Mouth Rinse, BID,  Renee Pain, MD, 15 mL at 06/14/21 0150   norepinephrine (LEVOPHED) 4mg  in 230mL premix infusion, 0-40 mcg/min, Intravenous, Continuous, Wynona Dove A, DO   ondansetron [facility]) injection 4 mg, 4 mg, Intravenous, Q6H PRN, Renee Pain, MD   polyethylene glycol (MIRALAX / GLYCOLAX) packet 17 g, 17 g, Oral, Daily PRN, Renee Pain, MD   sodium chloride flush (NS) 0.9 % injection 10-40 mL, 10-40 mL, Intracatheter, PRN, Jeanell Sparrow, DO   Home Medications  Prior to Admission medications   Medication Sig Start Date End Date Taking? Authorizing Provider  buPROPion (WELLBUTRIN XL) 300 MG 24 hr tablet Take 300 mg by mouth daily.   Yes [provider]  ciprofloxacin (CIPRO) 500 MG tablet Take 500 mg by mouth 2 (two) times daily. 06/06/21  Yes [provider]  furosemide (LASIX) 20 MG tablet Take 20 mg by mouth daily. 06/06/21  Yes [provider]  heparin 5000 UNIT/ML injection Inject 1 mL (5,000 Units total) into the skin every 8 (eight) hours. 04/18/21  Yes Estill Cotta, NP  metroNIDAZOLE (FLAGYL) 500 MG tablet Take 500 mg by mouth every 6 (six) hours. 06/06/21  Yes [provider]  sertraline (ZOLOFT) 100 MG tablet Take 100 mg by mouth at bedtime.   Yes [provider]  anidulafungin in sodium chloride 0.9 % 100 mL anidulafungin (ERAXIS) 100 mg in sodium chloride 0.9 % 100 mL IVPB 100 mg, Intravenous, at 78 mL/hr, Every 24 hours, First dose on Sun 04/17/21 at 1200  Instructions:   Infusion rate should not exceed 1.1 mg/minute (84 mL/hr). Patient not taking: No sig reported 04/17/21   Estill Cotta, NP  calcium carbonate (TUMS - DOSED IN MG ELEMENTAL CALCIUM) 500 MG chewable tablet Chew 1 tablet (200 mg of elemental calcium total) by mouth every 4 (four) hours as needed for indigestion or heartburn. Patient not taking: No sig reported 04/17/21   Estill Cotta, NP  ceFEPIme 2 g in sodium chloride 0.9 % 100 mL Inject 2 g into the vein  every 8 (eight) hours. Patient not taking: No sig reported 04/17/21   Estill Cotta, NP  diclofenac Sodium (VOLTAREN) 1 % GEL Apply 4 g topically 4 (four) times daily. Patient not taking: No sig reported 04/17/21   Estill Cotta, NP  docusate sodium (COLACE) 100 MG capsule Take 1 capsule (100 mg total) by mouth 2 (two) times daily as needed for mild constipation. Patient not taking: No sig reported 04/17/21   Estill Cotta, NP  famotidine (PEPCID) 20-0.9 MG/50ML-% Inject 50 mLs (20 mg total) into the vein every 12 (twelve) hours. Patient not taking: No sig reported 04/17/21   Estill Cotta, NP  lactated ringers infusion Inject 75 mLs into the vein continuous. 04/17/21   Estill Cotta, NP  metroNIDAZOLE (FLAGYL) 500 MG/100ML Inject 100 mLs (500 mg total) into the vein every 12 (twelve) hours. Patient not taking: No sig reported 04/18/21   Estill Cotta, NP  Mouthwashes (MOUTH RINSE) LIQD solution 15 mLs by Mouth Rinse route 2 (two) times daily. Patient not taking: No sig reported 04/17/21   Estill Cotta, NP  ondansetron [facility] 4 MG/2ML SOLN injection Inject 2 mLs (4 mg total) into the vein every 6 (six) hours as needed for nausea or vomiting. Patient not taking: No sig reported 04/17/21   Estill Cotta, NP  pantoprazole (PROTONIX) 40 MG injection Inject 40 mg into the vein at bedtime. Patient not taking: No sig reported 04/17/21   Estill Cotta, NP  polyethylene glycol (MIRALAX / GLYCOLAX) 17 g packet Take 17 g by mouth daily as needed for moderate constipation. Patient not taking: No sig reported 04/17/21   Estill Cotta, NP  vancomycin (VANCOREADY) 1250 MG/250ML SOLN Inject 250 mLs (1,250 mg total) into the vein every 12 (twelve) hours. Patient not taking: No sig reported 04/17/21   Estill Cotta, NP    Dorthy Cooler, PA-C

## 2021-06-15 MED ORDER — CALCIUM CARBONATE ANTACID 500 MG PO CHEW: 1.0000 | CHEWABLE_TABLET | Freq: Once | ORAL | 0 refills | Status: AC

## 2021-06-15 MED ORDER — CIPROFLOXACIN IN D5W 400 MG/200ML IV SOLN: 400.0000 mg | Freq: Two times a day (BID) | INTRAVENOUS | Status: DC

## 2021-06-15 MED ORDER — DEXTROSE 10 % IV SOLN: 75.0000 mL/h | INTRAVENOUS | Status: DC

## 2021-06-15 MED ORDER — ACETAMINOPHEN 325 MG PO TABS: 650.0000 mg | ORAL_TABLET | ORAL | Status: AC | PRN

## 2021-06-15 MED ORDER — CEFAZOLIN SODIUM-DEXTROSE 2-4 GM/100ML-% IV SOLN: 2.0000 g | Freq: Three times a day (TID) | INTRAVENOUS | Status: DC

## 2021-06-15 MED ORDER — INSULIN ASPART 100 UNIT/ML IJ SOLN: 0.0000 [IU] | INTRAMUSCULAR | 11 refills | Status: DC

## 2021-06-15 MED ORDER — FAMOTIDINE 20 MG IN NS 100 ML IVPB: 20.0000 mg | Freq: Two times a day (BID) | INTRAVENOUS | Status: DC

## 2021-06-15 MED ORDER — POLYETHYLENE GLYCOL 3350 17 G PO PACK: 17.0000 g | PACK | Freq: Every day | ORAL | 0 refills | Status: AC | PRN

## 2021-06-15 MED ORDER — METRONIDAZOLE 500 MG/100ML IV SOLN: 500.0000 mg | Freq: Two times a day (BID) | INTRAVENOUS | Status: DC

## 2021-06-15 MED ORDER — CHLORHEXIDINE GLUCONATE CLOTH 2 % EX PADS: 6.0000 | MEDICATED_PAD | Freq: Every day | CUTANEOUS | Status: DC

## 2021-06-15 MED ORDER — SODIUM CHLORIDE 0.9 % IV SOLN: 250.0000 mL | INTRAVENOUS | 0 refills | Status: DC

## 2021-06-15 NOTE — Discharge Summary (Addendum)
Physician Discharge Summary  ** n.b. This document is generated by the night-time coverage team AFTER the patient was physically transported from [facility] to [facility] per request from [place]. **     Patient ID: Curtis Price. MRN: [id number] DOB/AGE: [birth year] 74 y.o.  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 Caucasian male presented to the [facility] Emergency Department via EMS with complaints of fever. He started feeling ill yesterday and reportedly was febrile, generally weak.  Home health check today found the patient to be febrile and tachycardic, without improveement after acetaminophen.  He was advised to [facility] to ER.   In the ER, the patinet has been started on cefepime/Flagylvancomycin to empirically cover for abdominal sepsis.  He received 3 L LR IV bolus followed by infusion at 150 mL/hr.  His blood pressure has improved.   He has a past history of mucinous adenocarcinoma of appendix s/p colectomy, splenectomy, omentectomy, cholecystectomy, colostomy.  He subsequently developed malnutrition, requiring chronic TPN.  He has a known enterocutaneous fistula.  Also, he has a L PICC line in his LUE (unknown placement date at this time).  He was admitted to this facility in 04/15/2021, which resulted in transfer to [facility] on 04/17/2021.  He has had 2 subsequent admissions at [facility] in August, all problems related to infectious complications of his enterocutaneous fistula.  Hospital Course:  Admitted to ICU.  Remained normotensive with IV fluid fusion.   No vasopressor requirement.  Was treated with empiric cefepime/Flagyl/vancomycin.  Blood cultures did isolate MSSA.  Discharge Plan by Active Problems:  By systems: INFECTIOUS Sepsis/septic shock, unclear etiology: abdominal vs line vs other MSSA bacteremia Change Ancef/ciprofloxacin/Flagyl   HEMATOLOGIC Central line associated bloodstream infection PICC line removal.  Needs line holiday. Start D10 in lieu of TPN for now. 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, splenectomy, omentectomy, cholecystectomy, colostomy Continue TPN OK to supplement with regular PO diet GI PROPHYLAXIS: famotidine   RENAL: No acute issues Monitor urine output U/A is negative  PULMONARY: No acute issues Titrate supplemental oxygen to maintain SpO2 93+%   ENDOCRINE: No acute issues   NEUROLOGIC: No acute issues  Significant Hospital Tests/Studies:  Consults:  Discharge Exam: BP 111/64 (BP Location: Right Arm)   Pulse (!) 102   Temp 98.5 F (36.9 C) (Oral)   Resp 18   Ht 5\' 11"  (1.803 m)   Wt 85.9 kg   SpO2 96%   BMI 26.41 kg/m   Please refer to PROGRESS NOTE from 9/20 11:59. Off pressors.  PICC line removed.  Labs at Discharge: Lab Results  Component Value Date   CREATININE 1.05 06/14/2021   BUN 26 (H) 06/14/2021   NA 132 (L) 06/14/2021   K 5.5 (H) 06/14/2021   CL 101 06/14/2021   CO2 17 (L) 06/14/2021   Lab Results  Component Value Date   WBC 50.5 ([place]) 06/14/2021   HGB 8.3 (L) 06/14/2021  HCT 26.7 (L) 06/14/2021   MCV 81.7 06/14/2021   PLT 427 (H) 06/14/2021   Lab Results  Component Value Date   ALT QUANTITY NOT SUFFICIENT, UNABLE TO PERFORM TEST 06/14/2021   AST 65 (H) 06/14/2021   ALKPHOS 165 (H) 06/14/2021   BILITOT 0.7 06/14/2021   Lab Results  Component  Value Date   INR 1.2 06/13/2021   INR 1.3 (H) 04/15/2021   INR 1.0 ratio 02/22/2010    Current Radiology Studies: DG Chest 2 View  Result Date: 06/13/2021 CLINICAL DATA:  Suspected sepsis. EXAM: CHEST - 2 VIEW COMPARISON:  April 15, 2021. FINDINGS: LEFT-sided PICC line terminates at the caval to atrial junction. EKG leads project over the chest. Cardiomediastinal contours accentuated by portable technique likely mildly enlarged but unchanged. Lung volumes are slightly improved compared to the prior study. LEFT hemidiaphragm is obscured with increased density in the retrocardiac region though mildly improved compared to the previous study. Lateral view with small layering effusions. On limited assessment there is no acute skeletal process. IMPRESSION: Improved lung volumes with persistent LEFT basilar airspace disease. Small layering effusions. Electronically Signed   By: Zetta Bills M.D.   On: 06/13/2021 14:04   CT ABDOMEN PELVIS W CONTRAST  Result Date: 06/13/2021 CLINICAL DATA:  Abdominal pain and fevers EXAM: CT ABDOMEN AND PELVIS WITH CONTRAST TECHNIQUE: Multidetector CT imaging of the abdomen and pelvis was performed using the standard protocol following bolus administration of intravenous contrast. CONTRAST:  30mL OMNIPAQUE IOHEXOL 350 MG/ML SOLN COMPARISON:  04/16/2021, by report from 05/17/2021 FINDINGS: Lower chest: Lung bases demonstrate lower lobe consolidation bilaterally with associated small to moderate effusions. This has increased in the interval from the prior exam particularly on the right. Hepatobiliary: Gallbladder has been surgically removed. Liver shows fatty infiltration. Pancreas: Unremarkable. No pancreatic ductal dilatation or surrounding inflammatory changes. Spleen: Surgically removed Adrenals/Urinary Tract: Adrenal glands are within normal limits. Kidneys demonstrate a normal enhancement pattern bilaterally. No renal calculi or obstructive changes are seen. Ureters are  within normal limits. The bladder is partially distended. Stomach/Bowel: Extensive small bowel surgery and right colonic surgery is noted. No obstructive changes are seen. Stomach is within normal limits. Persistent density is noted along the right lower abdominal wall best seen on image number 65 through 68 of series 3 likely related to prior percutaneous catheter placement. Correlate with any persistent fistulization. Similar changes are noted superiorly on image number 44 of series 3 which may also represent an area of prior tube placement. Again correlate with any fistulization. Vascular/Lymphatic: Aortic atherosclerosis. No enlarged abdominal or pelvic lymph nodes. Reproductive: Prostate is unremarkable. Other: There is a air-fluid collection identified beneath the surgical wound in the midline anteriorly. This previously measured up to 6.5 cm. This now is reduced in size now measuring approximately 2.8 x 2.7 cm. This is best visualized on image number 71 of series 3. Musculoskeletal: Degenerative changes of lumbar spine are noted. IMPRESSION: Extensive changes consistent with the given clinical history of mucinous adenocarcinoma in the right lower quadrant. Areas consistent with prior catheter placement are noted along the right abdominal wall. Correlate with any current fistulization. Persistent ventral wall laparotomy defect with a small subcutaneous fluid collection as described significantly reduced when compared with the prior exam from 04/16/2021 as well as by report from prior exam from 05/17/2021 Increase in bilateral pleural effusions and lower lobe consolidation particularly on the right when compared with the prior exam. Electronically Signed   By: Linus Mako.D.  On: 06/13/2021 21:10   DG Chest Port 1 View  Result Date: 06/13/2021 CLINICAL DATA:  Confusion, sepsis EXAM: PORTABLE CHEST 1 VIEW COMPARISON:  Portable exam 1410 hours compared to 06/13/2021 FINDINGS: LEFT arm PICC line tip  projects over SVC. Mild enlargement of cardiac silhouette. Mediastinal contours and pulmonary vascularity normal. Atherosclerotic calcification aorta and at the carotid bifurcations bilaterally. Bibasilar atelectasis and small LEFT pleural effusion again seen. Remaining lungs clear. No pneumothorax or acute osseous findings. Bones demineralized. IMPRESSION: Bibasilar atelectasis and small LEFT pleural effusion. Enlargement of cardiac silhouette. Scattered atherosclerotic calcifications including carotid bifurcations. Aortic Atherosclerosis (ICD10-I70.0). Electronically Signed   By: Lavonia Dana M.D.   On: 06/13/2021 14:47   ECHOCARDIOGRAM COMPLETE  Result Date: 06/14/2021    ECHOCARDIOGRAM REPORT   Patient Name:   Jhan Conery. Date of Exam: 06/14/2021 Medical Rec #:  [id number]           Height:       71.0 in Accession #:    [id number]          Weight:       189.4 lb Date of Birth:  1945/12/27            BSA:          2.060 m Patient Age:    6 years            BP:           110/62 mmHg Patient Gender: M                   HR:           103 bpm. Exam Location:  Inpatient Procedure: 2D Echo, Cardiac Doppler and Color Doppler Indications:    Bacteremia R78.81  History:        Patient has no prior history of Echocardiogram examinations.  Sonographer:    Merrie Roof RDCS Referring Phys: [id number] [place]  1. Left ventricular ejection fraction, by estimation, is 60 to 65%. The left ventricle has normal function. The left ventricle has no regional wall motion abnormalities. There is mild concentric left ventricular hypertrophy. Left ventricular diastolic parameters are indeterminate. Elevated left ventricular end-diastolic pressure.  2. Right ventricular systolic function is normal. The right ventricular size is normal. Tricuspid regurgitation signal is inadequate for assessing PA pressure.  3. The mitral valve is normal in structure. No evidence of mitral valve regurgitation. No evidence of  mitral stenosis.  4. The aortic valve was not well visualized. Aortic valve regurgitation is not visualized. Mild to moderate aortic valve sclerosis/calcification is present, without any evidence of aortic stenosis.  5. The inferior vena cava is normal in size with greater than 50% respiratory variability, suggesting right atrial pressure of 3 mmHg. Conclusion(s)/Recommendation(s): No evidence of valvular vegetations on this transthoracic echocardiogram. Would recommend a transesophageal echocardiogram to exclude infective endocarditis if clinically indicated. FINDINGS  Left Ventricle: Left ventricular ejection fraction, by estimation, is 60 to 65%. The left ventricle has normal function. The left ventricle has no regional wall motion abnormalities. The left ventricular internal cavity size was normal in size. There is  mild concentric left ventricular hypertrophy. Left ventricular diastolic parameters are indeterminate. Elevated left ventricular end-diastolic pressure. Right Ventricle: The right ventricular size is normal. No increase in right ventricular wall thickness. Right ventricular systolic function is normal. Tricuspid regurgitation signal is inadequate for assessing PA pressure. Left Atrium: Left atrial size was normal in size. Right Atrium: Right atrial  size was normal in size. Pericardium: There is no evidence of pericardial effusion. Mitral Valve: The mitral valve is normal in structure. No evidence of mitral valve regurgitation. No evidence of mitral valve stenosis. Tricuspid Valve: The tricuspid valve is normal in structure. Tricuspid valve regurgitation is not demonstrated. No evidence of tricuspid stenosis. Aortic Valve: The aortic valve was not well visualized. Aortic valve regurgitation is not visualized. Mild to moderate aortic valve sclerosis/calcification is present, without any evidence of aortic stenosis. Aortic valve mean gradient measures 6.0 mmHg.  Aortic valve peak gradient measures 11.3  mmHg. Aortic valve area, by VTI measures 1.98 cm. Pulmonic Valve: The pulmonic valve was normal in structure. Pulmonic valve regurgitation is not visualized. No evidence of pulmonic stenosis. Aorta: The aortic root is normal in size and structure. Venous: The inferior vena cava is normal in size with greater than 50% respiratory variability, suggesting right atrial pressure of 3 mmHg. IAS/Shunts: The interatrial septum appears to be lipomatous. No atrial level shunt detected by color flow Doppler.  LEFT VENTRICLE PLAX 2D LVIDd:         4.10 cm  Diastology LVIDs:         2.80 cm  LV e' medial:    8.16 cm/s LV PW:         1.20 cm  LV E/e' medial:  15.3 LV IVS:        1.20 cm  LV e' lateral:   8.49 cm/s LVOT diam:     2.00 cm  LV E/e' lateral: 14.7 LV SV:         54 LV SV Index:   26 LVOT Area:     3.14 cm  RIGHT VENTRICLE RV Basal diam:  3.90 cm LEFT ATRIUM             Index       RIGHT ATRIUM           Index LA diam:        4.50 cm 2.18 cm/m  RA Area:     13.00 cm LA Vol (A2C):   31.2 ml 15.14 ml/m RA Volume:   30.00 ml  14.56 ml/m LA Vol (A4C):   52.5 ml 25.48 ml/m LA Biplane Vol: 41.0 ml 19.90 ml/m  AORTIC VALVE AV Area (Vmax):    2.17 cm AV Area (Vmean):   1.85 cm AV Area (VTI):     1.98 cm AV Vmax:           168.00 cm/s AV Vmean:          120.000 cm/s AV VTI:            0.275 m AV Peak Grad:      11.3 mmHg AV Mean Grad:      6.0 mmHg LVOT Vmax:         116.00 cm/s LVOT Vmean:        70.700 cm/s LVOT VTI:          0.173 m LVOT/AV VTI ratio: 0.63  AORTA Ao Root diam: 3.50 cm MITRAL VALVE MV Area (PHT): 5.88 cm     SHUNTS MV Decel Time: 129 msec     Systemic VTI:  0.17 m MV E velocity: 125.00 cm/s  Systemic Diam: 2.00 cm MV A velocity: 138.00 cm/s MV E/A ratio:  0.91 Fransico Him MD Electronically signed by Fransico Him MD Signature Date/Time: 06/14/2021/3:54:11 PM    Final     Disposition: transfer to [facility]  Allergies as of 06/15/2021       Reactions   Atenolol Other (See Comments)    Unknown reaction   Atorvastatin Other (See Comments)   Unknown reaction   Darvocet [propoxyphene N-acetaminophen] Other (See Comments)   Unknown reaction   Paroxetine Other (See Comments)   Unknown reaction   Pregabalin Other (See Comments)   Unknown reaction   Propoxyphene Hcl Other (See Comments)   Unknown reaction        Medication List     STOP taking these medications    anidulafungin in sodium chloride 0.9 % 100 mL   ceFEPIme 2 g in sodium chloride 0.9 % 100 mL   ciprofloxacin 500 MG tablet Commonly known as: CIPRO   diclofenac Sodium 1 % Gel Commonly known as: VOLTAREN   docusate sodium 100 MG capsule Commonly known as: COLACE   famotidine 20-0.9 MG/50ML-% Commonly known as: PEPCID Replaced by: famotidine 20 mg   furosemide 20 MG tablet Commonly known as: LASIX   mouth rinse Liqd solution   ondansetron 4 MG/2ML Soln injection Commonly known as: ZOFRAN   pantoprazole 40 MG injection Commonly known as: PROTONIX   vancomycin 1250 MG/250ML Soln Commonly known as: VANCOREADY       TAKE these medications    acetaminophen 325 MG tablet Commonly known as: TYLENOL Take 2 tablets (650 mg total) by mouth every 4 (four) hours as needed for mild pain (temp > 101.5).   buPROPion 300 MG 24 hr tablet Commonly known as: WELLBUTRIN XL Take 300 mg by mouth daily.   calcium carbonate 500 MG chewable tablet Commonly known as: TUMS - dosed in mg elemental calcium Chew 1 tablet (200 mg of elemental calcium total) by mouth once for 1 dose. What changed:  when to take this reasons to take this   ceFAZolin 2-4 GM/100ML-% IVPB Commonly known as: ANCEF Inject 100 mLs (2 g total) into the vein every 8 (eight) hours.   Chlorhexidine Gluconate Cloth 2 % Pads Apply 6 each topically daily.   ciprofloxacin 400 MG/200ML Soln Commonly known as: CIPRO Inject 200 mLs (400 mg total) into the vein every 12 (twelve) hours.   dextrose 10 % infusion Inject 75 mL/hr into  the vein continuous.   famotidine 20 mg Commonly known as: PEPCID Inject 100 mLs (20 mg total) into the vein every 12 (twelve) hours. Replaces: famotidine 20-0.9 MG/50ML-%   heparin 5000 UNIT/ML injection Inject 1 mL (5,000 Units total) into the skin every 8 (eight) hours.   insulin aspart 100 UNIT/ML injection Commonly known as: novoLOG Inject 0-6 Units into the skin every 4 (four) hours.   lactated ringers infusion Inject 75 mLs into the vein continuous.   metroNIDAZOLE 500 MG/100ML Commonly known as: FLAGYL Inject 100 mLs (500 mg total) into the vein every 12 (twelve) hours. What changed: Another medication with the same name was removed. Continue taking this medication, and follow the directions you see here.   polyethylene glycol 17 g packet Commonly known as: MIRALAX / GLYCOLAX Take 17 g by mouth daily as needed for moderate constipation.   sertraline 100 MG tablet Commonly known as: ZOLOFT Take 100 mg by mouth at bedtime.   sodium chloride 0.9 % infusion Inject 250 mLs into the vein continuous.         Discharged Condition: stable  30 minutes of time have been dedicated to discharge assessment, planning and discharge instructions.   Signed:  Renee Pain, MD Board Certified by the ABIM, Pulmonary Diseases &  Critical Care Medicine 06/15/21 1:46 AM

## 2021-06-16 LAB — CULTURE, BLOOD (ROUTINE X 2): Special Requests: ADEQUATE

## 2021-06-16 LAB — URINE CULTURE: Culture: 30000 — AB

## 2021-06-18 LAB — CULTURE, BLOOD (ROUTINE X 2): Special Requests: ADEQUATE

## 2021-06-19 LAB — CULTURE, BLOOD (ROUTINE X 2)
Culture: NO GROWTH
Culture: NO GROWTH
Special Requests: ADEQUATE
Special Requests: ADEQUATE

## 2021-06-30 ENCOUNTER — Telehealth: Payer: Self-pay | Admitting: Internal Medicine

## 2021-06-30 NOTE — Telephone Encounter (Signed)
Spoke with Rory Percy, nurse with [facility]. Home Health called to request for patient and  family a hospice referral to [facility]. Per Dr. [name] faxed referral to [facility]. Called [facility] with verbal order, called family, and called Rory Percy to advise.

## 2021-07-06 ENCOUNTER — Ambulatory Visit: Payer: Medicare Other | Admitting: Adult Health

## 2021-08-04 ENCOUNTER — Encounter: Payer: Self-pay | Admitting: Internal Medicine

## 2021-12-26 ENCOUNTER — Encounter: Payer: Self-pay | Admitting: Internal Medicine

## 2021-12-26 ENCOUNTER — Ambulatory Visit (INDEPENDENT_AMBULATORY_CARE_PROVIDER_SITE_OTHER): Payer: Medicare Other | Admitting: Internal Medicine

## 2021-12-26 VITALS — BP 104/54 | HR 105 | Temp 97.9°F | Ht 70.0 in | Wt 144.0 lb

## 2021-12-26 DIAGNOSIS — I82422 Acute embolism and thrombosis of left iliac vein: Secondary | ICD-10-CM

## 2021-12-26 DIAGNOSIS — D500 Iron deficiency anemia secondary to blood loss (chronic): Secondary | ICD-10-CM | POA: Diagnosis not present

## 2021-12-26 DIAGNOSIS — L02211 Cutaneous abscess of abdominal wall: Secondary | ICD-10-CM | POA: Diagnosis not present

## 2021-12-26 DIAGNOSIS — C181 Malignant neoplasm of appendix: Secondary | ICD-10-CM | POA: Diagnosis not present

## 2021-12-26 DIAGNOSIS — E43 Unspecified severe protein-calorie malnutrition: Secondary | ICD-10-CM

## 2021-12-26 DIAGNOSIS — Z79899 Other long term (current) drug therapy: Secondary | ICD-10-CM

## 2021-12-26 DIAGNOSIS — R627 Adult failure to thrive: Secondary | ICD-10-CM

## 2021-12-26 DIAGNOSIS — R188 Other ascites: Secondary | ICD-10-CM

## 2021-12-26 DIAGNOSIS — E559 Vitamin D deficiency, unspecified: Secondary | ICD-10-CM

## 2021-12-26 DIAGNOSIS — K632 Fistula of intestine: Secondary | ICD-10-CM

## 2021-12-26 DIAGNOSIS — D511 Vitamin B12 deficiency anemia due to selective vitamin B12 malabsorption with proteinuria: Secondary | ICD-10-CM

## 2021-12-26 DIAGNOSIS — R112 Nausea with vomiting, unspecified: Secondary | ICD-10-CM

## 2021-12-26 MED ORDER — APIXABAN 5 MG PO TABS: 5.0000 mg | ORAL_TABLET | Freq: Two times a day (BID) | ORAL | 3 refills | Status: DC

## 2021-12-26 MED ORDER — FAMOTIDINE 40 MG PO TABS: 40.0000 mg | ORAL_TABLET | Freq: Every evening | ORAL | 1 refills | Status: DC

## 2021-12-26 MED ORDER — METAMUCIL 0.52 G PO CAPS: 0.5200 g | ORAL_CAPSULE | Freq: Every day | ORAL | 0 refills | Status: DC

## 2021-12-26 MED ORDER — PANTOPRAZOLE SODIUM 40 MG PO TBEC: 40.0000 mg | DELAYED_RELEASE_TABLET | Freq: Every day | ORAL | 1 refills | Status: DC

## 2021-12-26 MED ORDER — METAMUCIL 0.52 G PO CAPS: 0.5200 g | ORAL_CAPSULE | Freq: Every day | ORAL | 0 refills | Status: AC

## 2021-12-26 NOTE — Progress Notes (Signed)
? ?Future Appointments  ?Date Time Provider Department  ?12/26/2021 11:30 AM [name], MD [facility]  ? ? ?History of Present Illness: ? ?                                                He has a past history of mucinous adenocarcinoma of appendix, s/p colectomy, splenectomy, omentectomy, cholecystectomy in April 2022.    In May 2022, he had a Lt ileofemoral DVT treated with Eliquis ( initiating  treatment during recent hospitalization 12/14/21). He has complications of an enterocutaneous fistula prompting several readmissions  to [facility] for Sepsis . He has required chronic TPN at home, but finally has been able to take a regular diet .  Patient last hospitalized  12/14/2021  thru 12/17/2022 at [facility] by Dr Clovis Riley for recurrent abdominal wall abscesses.   He was transfused 3 u pRBC for severe anemia 5.4 gm% up to 8.7 gm% post transfusion. Patient has been home 10 days from the hospital.  ? ? ?    At hospital discharge , apparently his Hospice services were rescinded and he was advise to begin Home Health Physical therapy with [facility]. ? ? ? ?    Prior to the above illness, patient had been followed for  HTN, HLD, Pre-Diabetes, GERD and Vitamin D Deficiency.  ? ? ?    Patient is treated for HTN  (1994) in the past .  In 2011, he had a negative heart cath. Today?s BP is  104/54. Patient has had no complaints of any cardiac type chest pain, palpitations, orthopnea / PND, dizziness, claudication,  but does have dyspnea with minimal activity walking with a walker. Also, c/o dependent edema. ? ? ?    Hyperlipidemia is controlled with diet & meds in the past.  Last Lipids were at goal consequent of poor nutritional status.  ? ?Lab Results  ?Component Value Date  ? CHOL 133 10/08/2020  ? HDL 58 10/08/2020  ? [place] 47 10/08/2020  ? LDLDIRECT 151.0 02/10/2010  ? TRIG 148 04/17/2021  ? CHOLHDL 2.3 10/08/2020  ? ? ? ?Also, the patient has in the past been followed expectantly for  glucose intolerance  and has had no symptoms of reactive hypoglycemia, diabetic polys, paresthesias or visual blurring.  Last A1c was normal & at goal : ? ?Lab Results  ?Component Value Date  ? HGBA1C 5.3 10/08/2020  ? ?    ? ?Further, the patient also has history of Vitamin D Deficiency ("38" / 2016) and supplements vitamin D without any suspected side-effects. Last vitamin D was at goal : ? ?Lab Results  ?Component Value Date  ? VD25OH 68 10/08/2020  ? ? ?Current Meds Sig  ? polyethylene glycol 17 g packet Take daily   ? acetaminophen  325 MG tablet Take 2 tablets every 4 hours as needed  ? Marland Kitchen Pantoprazole 40 mg tab  Take 1 tablet Daily   ? . Eliquis 5 mg tab  Take 1 tablet 2 x /day ( every 12 hours)   ?    ? ? ? ?Allergies  ?Allergen Reactions  ? Atenolol Unknown reaction  ? Atorvastatin Unknown reaction  ? Darvocet  Unknown reaction  ? Paroxetine Unknown reaction  ? Pregabalin Unknown reaction  ? Propoxyphene Hcl Unknown reaction  ? ? ?PMHx:   ?Past  Medical History:  ?Diagnosis Date  ? Allergy   ? BPH (benign prostatic hypertrophy)   ? Elevated serum immunoglobulin free light chains 04/28/2014  ? Erythrocytosis 04/28/2014  ? Exertional dyspnea   ? ETT myoview 5/11: 8 min, no ischemic ECG changes, stopped due to fatigue/ SOB. EF 60%, no ischemia or infarction. Echo 5/11 EF 60%, no significant valvular abnormalities, mild RV dilation and dysfunction, no complete TR doppler jet so PA pressure hard to gauge. 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 R heart cath 6/11: mean RA 10 mmHg, PA 25/18, mean PCWP 13 mmHg, CI 2.1  ? Fibromyalgia   ? GERD (gastroesophageal reflux disease)   ? History of epididymitis   ? History of head injury without skull fracture 1980  ? Concussion  ? Hx of adenomatous polyp of colon 06/19/2007  ? Hyperlipidemia   ? Myalgias with Lipitor  ? Hypertension   ? Hypogonadism male   ? Myofascial pain syndrome   ? Nephrolithiasis   ? Prolapsed internal hemorrhoids   ? 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 Rheumatic fever   ? x3 at ages [age] and 77; No significant valvular abnormality on 5/11 echo  ? Tendinitis 2000  ? Hx of left bicep  ? Vitamin D deficiency   ? ? ? ?Immunization History  ?Administered Date(s) Administered  ? HiB  01/27/2021  ? Influenza, High Dose  08/13/2014, 08/31/2015  ? J&J SARS-COV-2 Vacc 02/24/2020  ? Meningococcal B, OMV 01/27/2021  ? Meningococcal  01/27/2021, 01/27/2021  ? Pneumococcal -13 08/13/2014, 01/27/2021  ? Pneumococcal -23 07/12/2012  ? Tdap 04/25/2009, 12/02/2019  ? Zoster, Live 07/27/2007  ? ? ? ?FHx:    Reviewed / unchanged ? ?SHx:    Reviewed / unchanged  ? ?Systems Review: ? ?Constitutional: Denies fever, chills, wt changes, headaches, insomnia, fatigue, night sweats. Does report poor appetite.  ?Eyes: Denies redness, blurred vision, diplopia, discharge, itchy, watery eyes.  ?ENT: Denies discharge, congestion, post nasal drip, epistaxis, sore throat, earache, hearing loss, dental pain, tinnitus, vertigo, sinus pain, snoring.  ?CV: Denies chest pain, palpitations, irregular heartbeat, syncope, dyspnea, diaphoresis, orthopnea, PND, claudication or edema. ?Respiratory: denies cough, dyspnea, DOE, pleurisy, hoarseness, laryngitis, wheezing.  ?Gastrointestinal:  has occasional reflux if omits his pantoprazole. Has difficulty with BMs. Also has constant leakage from his enterocutaneous abdominal fistula.  ?Genitourinary: Denies dysuria, frequency, urgency, nocturia, hesitancy, discharge, hematuria or flank pain. ?Musculoskeletal: Has generalized weakness of deconditioning ?Skin: Denies pruritus, rash, hives, warts, acne, eczema . ?Neuro: No tremor, incoordination, spasms or paresthesia . ?Psychiatric: Denies confusion, memory loss or sensory loss. ?Endo: Has lost 70 # weight over the last year.  ?Heme/Lymph: No excessive bleeding, bruising or enlarged lymph nodes. ? ?Physical Exam ? ?BP (!) 104/54   Pulse (!) 105   Temp 97.9 ?F (36.6 ?C)   Ht '5\' 10"'$  (1.778 m)    Wt 144 lb (65.3 kg)   SpO2 99%   BMI 20.66 kg/m?  ? ?Appears  chronically ill & malnourished  ( 64 #weight loss in the last year). ? ?Eyes: PERRLA, EOMs, conjunctiva no swelling or erythema. ?Sinuses: No frontal/maxillary tenderness ?ENT/Mouth: EAC's clear, TM's nl w/o erythema, bulging. Nares clear w/o erythema, swelling, exudates. Oropharynx clear without erythema or exudates. Oral hygiene is good. Tongue normal, non obstructing. Hearing is poor - Lt worse than Rt.  ?Neck: Supple. Thyroid not palpable. Car 2+/2+ without bruits, nodes or JVD. ?Chest: Respirations nl with BS clear & equal w/o  rales, rhonchi, wheezing or stridor.  ?Cor: Heart sounds normal w/ regular rate and rhythm without sig. murmurs, gallops, clicks or rubs. Peripheral pulses normal and equal  without edema.  ?Abdomen: Soft & bowel sounds normal.  Slightly tender in Rt abdomen w/o guarding or RB.  Noted fistula in LLQ draining feculent brown liquid.  No hernias, masses or organomegaly.  ?Lymphatics: Unremarkable.  ?Musculoskeletal: Generalized moderately severe decrease in muscle power, tone & bulk.  ?Skin: Warm, dry without exposed rashes, lesions or ecchymosis apparent.  ?Neuro: Cranial nerves intact, reflexes equal bilaterally. Sensory-motor testing grossly intact. Tendon reflexes grossly intact.  ?Pysch: Alert & oriented x 3.  Insight and judgement seem limited . Affect is anger which he attributes to his frustrations with his decline in health.  ? ?Assessment and Plan: ? ? ?1. Enterocutaneous fistula ? ?2. Abdominal wall abscess ? ?- CBC with Differential/Platelet ? ?3. Iron deficiency anemia due to chronic blood loss ?( Or more likely due to PPI  & achlorhydria)  ? ?- CBC with Differential/Platelet ?- Iron, Total/Total Iron Binding Cap ? ?4. Primary mucinous adenocarcinoma of appendix , hx  ([place]) ? ? ?5. Severe protein-calorie malnutrition ([place]) ? ?- CBC with Differential/Platelet ?- COMPLETE METABOLIC PANEL WITH GFR ?- TSH ? ?6. Deep  vein thrombosis (DVT) of left iliofemoral vein (HCC) ? ? ?7. Adult failure to thrive ? ?- COMPLETE METABOLIC PANEL WITH GFR ?- TSH ? ?8. Nausea and vomiting, unspecified vomiting type ? ?- COMPLETE METABOLIC PAN

## 2021-12-27 ENCOUNTER — Other Ambulatory Visit: Payer: Self-pay | Admitting: Internal Medicine

## 2021-12-27 DIAGNOSIS — E039 Hypothyroidism, unspecified: Secondary | ICD-10-CM

## 2021-12-27 LAB — CBC WITH DIFFERENTIAL/PLATELET
Absolute Monocytes: 1742 cells/uL — ABNORMAL HIGH (ref 200–950)
Basophils Absolute: 36 cells/uL (ref 0–200)
Basophils Relative: 0.3 %
Eosinophils Absolute: 133 cells/uL (ref 15–500)
Eosinophils Relative: 1.1 %
HCT: 31.6 % — ABNORMAL LOW (ref 38.5–50.0)
Hemoglobin: 9.4 g/dL — ABNORMAL LOW (ref 13.2–17.1)
Lymphs Abs: 3086 cells/uL (ref 850–3900)
MCH: 22.5 pg — ABNORMAL LOW (ref 27.0–33.0)
MCHC: 29.7 g/dL — ABNORMAL LOW (ref 32.0–36.0)
MCV: 75.8 fL — ABNORMAL LOW (ref 80.0–100.0)
MPV: 10.1 fL (ref 7.5–12.5)
Monocytes Relative: 14.4 %
Neutro Abs: 7103 cells/uL (ref 1500–7800)
Neutrophils Relative %: 58.7 %
Platelets: 417 10*3/uL — ABNORMAL HIGH (ref 140–400)
RBC: 4.17 10*6/uL — ABNORMAL LOW (ref 4.20–5.80)
RDW: 23.2 % — ABNORMAL HIGH (ref 11.0–15.0)
Total Lymphocyte: 25.5 %
WBC: 12.1 10*3/uL — ABNORMAL HIGH (ref 3.8–10.8)

## 2021-12-27 LAB — CBC MORPHOLOGY

## 2021-12-27 LAB — COMPLETE METABOLIC PANEL WITH GFR
AG Ratio: 0.9 (calc) — ABNORMAL LOW (ref 1.0–2.5)
ALT: 6 U/L — ABNORMAL LOW (ref 9–46)
AST: 10 U/L (ref 10–35)
Albumin: 3.2 g/dL — ABNORMAL LOW (ref 3.6–5.1)
Alkaline phosphatase (APISO): 61 U/L (ref 35–144)
BUN/Creatinine Ratio: 19 (calc) (ref 6–22)
BUN: 13 mg/dL (ref 7–25)
CO2: 26 mmol/L (ref 20–32)
Calcium: 9.2 mg/dL (ref 8.6–10.3)
Chloride: 102 mmol/L (ref 98–110)
Creat: 0.67 mg/dL — ABNORMAL LOW (ref 0.70–1.28)
Globulin: 3.6 g/dL (calc) (ref 1.9–3.7)
Glucose, Bld: 107 mg/dL — ABNORMAL HIGH (ref 65–99)
Potassium: 5.1 mmol/L (ref 3.5–5.3)
Sodium: 138 mmol/L (ref 135–146)
Total Bilirubin: 0.3 mg/dL (ref 0.2–1.2)
Total Protein: 6.8 g/dL (ref 6.1–8.1)
eGFR: 98 mL/min/{1.73_m2} (ref >=60)

## 2021-12-27 LAB — TSH: TSH: 5.19 mIU/L — ABNORMAL HIGH (ref 0.40–4.50)

## 2021-12-27 LAB — IRON, TOTAL/TOTAL IRON BINDING CAP
%SAT: 5 % (calc) — ABNORMAL LOW (ref 20–48)
Iron: 14 ug/dL — ABNORMAL LOW (ref 50–180)
TIBC: 271 mcg/dL (calc) (ref 250–425)

## 2021-12-27 LAB — VITAMIN D 25 HYDROXY (VIT D DEFICIENCY, FRACTURES): Vit D, 25-Hydroxy: 32 ng/mL (ref 30–100)

## 2021-12-27 LAB — VITAMIN B12: Vitamin B-12: 414 pg/mL (ref 200–1100)

## 2021-12-27 MED ORDER — LEVOTHYROXINE SODIUM 50 MCG PO TABS: ORAL_TABLET | ORAL | 0 refills | Status: DC

## 2021-12-27 NOTE — Progress Notes (Signed)
?<><><><><><><><><><><><><><><><><><><><><><><><><><><><><><><><><> ?<><><><><><><><><><><><><><><><><><><><><><><><><><><><><><><><><> ? ?- ?  CBC shows RBC - OK at 9.4 GM% , but shows iron still ? ?Very ?Low, So . . . . .  ? ?- ?Please take an OTC iron supplement 3 x /day with Meals .  ?<><><><><><><><><><><><><><><><><><><><><><><><><><><><><><><><><> ?<><><><><><><><><><><><><><><><><><><><><><><><><><><><><><><><><> ? ?- ?Glucose , kidney functions, electrolytes, liver enzymes all back to Normal.  ? ?- Even ? Protein ? ?is back in Normal Range showing good Nutrition  ?<><><><><><><><><><><><><><><><><><><><><><><><><><><><><><><><><> ?<><><><><><><><><><><><><><><><><><><><><><><><><><><><><><><><><> ? ?- ?Thyroid is low showing an ?underactive thyroid which can cause  ?? ? ? ? ? ? ? ? ? ? ? ? ? ? ? ? ? ? ? ? ? ? ? ? ? ? ? ? ? ? ? ? Fatigue, Lethargy, Depression & Fatigue, So  ? ?- Sent in new Rx for a thyroid medicine.  ?<><><><><><><><><><><><><><><><><><><><><><><><><><><><><><><><><> ?<><><><><><><><><><><><><><><><><><><><><><><><><><><><><><><><><>  ?

## 2022-01-26 ENCOUNTER — Encounter: Payer: Self-pay | Admitting: Internal Medicine

## 2022-01-26 ENCOUNTER — Ambulatory Visit (INDEPENDENT_AMBULATORY_CARE_PROVIDER_SITE_OTHER): Payer: Medicare Other | Admitting: Internal Medicine

## 2022-01-26 ENCOUNTER — Ambulatory Visit: Payer: Medicare Other | Admitting: Internal Medicine

## 2022-01-26 VITALS — BP 110/70 | HR 100 | Temp 97.8°F | Resp 18 | Ht 70.0 in | Wt 146.6 lb

## 2022-01-26 DIAGNOSIS — Z79899 Other long term (current) drug therapy: Secondary | ICD-10-CM | POA: Diagnosis not present

## 2022-01-26 DIAGNOSIS — E039 Hypothyroidism, unspecified: Secondary | ICD-10-CM

## 2022-01-26 DIAGNOSIS — K632 Fistula of intestine: Secondary | ICD-10-CM

## 2022-01-26 DIAGNOSIS — H9193 Unspecified hearing loss, bilateral: Secondary | ICD-10-CM

## 2022-01-26 DIAGNOSIS — H6593 Unspecified nonsuppurative otitis media, bilateral: Secondary | ICD-10-CM

## 2022-01-26 DIAGNOSIS — D500 Iron deficiency anemia secondary to blood loss (chronic): Secondary | ICD-10-CM

## 2022-01-26 MED ORDER — FAMOTIDINE 40 MG PO TABS: ORAL_TABLET | ORAL | 3 refills | Status: AC

## 2022-01-26 MED ORDER — APIXABAN 5 MG PO TABS: ORAL_TABLET | ORAL | 3 refills | Status: AC

## 2022-01-26 MED ORDER — POLYSACCHARIDE IRON COMPLEX 150 MG PO CAPS: ORAL_CAPSULE | ORAL | 3 refills | Status: AC

## 2022-01-26 MED ORDER — PANTOPRAZOLE SODIUM 40 MG PO TBEC: DELAYED_RELEASE_TABLET | ORAL | 3 refills | Status: AC

## 2022-01-26 NOTE — Progress Notes (Signed)
? ? ?Future Appointments  ?Date Time Provider Department  ?01/26/2022 11:30 AM [name], MD [facility]  ? ? ?History of Present Illness: ? ?    Patient is a very nice  (& unfortunate) 76 yo MWM  s/p  Appendectomy for mucinous adenocarcinoma of appendix, s/p colectomy, splenectomy, omentectomy, cholecystectomy in April 2022 and consequent complications of an enterocutaneous fistula who was recently hospitalized 3/22-24/2023 at [facility]  for recurrent abdominal wall abscesses & during hospitalization required 3 u pRBC Tx for severe anemia 5.4 gm% up to 8.7 gm% post transfusion.  CBC found hgb 9.4 mg% stable 1 month ago, but [place] iron levels were very low. He also was discovered elevated TSH & was prescribed  Thyroid replacement, but apparently Pharmacy never filled the RX for Thyroid.  Patient still using a walker for gait stability.  ?   ?    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  ? ? ?Medications ? ?  levothyroxine (SYNTHROID) 50 MCG tablet, Take 1 tablet daily on an empty stomach with only water for 30 minutes & no Antacid meds, Calcium or Magnesium for 4 hours & avoid Biotin ? ?  acetaminophen (TYLENOL) 325 MG tablet, Take 2 tablets (650 mg total) by mouth every 4 (four) hours as needed for mild pain (temp > 101.5). (Patient not taking: Reported on 12/26/2021) ? ?  apixaban (ELIQUIS) 5 MG TABS tablet, Take 1 tablet (5 mg total) by mouth 2 (two) times daily. ? ?  pantoprazole (PROTONIX) 40 MG tablet, Take 1 tablet (40 mg total) by mouth daily. ?  polyethylene glycol (MIRALAX / GLYCOLAX) 17 g packet, Take 17 g by mouth daily as needed for moderate constipation. ?  psyllium (METAMUCIL) 0.52 g capsule, Take 1 capsule (0.52 g total) by mouth daily. ? ?Problem list ?He has Essential hypertension; Benign prostatic  hyperplasia; GERD; Fibromyalgia; Testosterone Deficiency; Hyperlipidemia, mixed; Vitamin D deficiency; Medication management; Elevated serum immunoglobulin free light chains; Hx of adenomatous polyp of colon; Abnormal glucose; Erectile dysfunction; Obesity (BMI 30.0-34.9); Dysthymia; Recurrent cold sores; Polycythemia; Abnormality of colon-appendix on colonoscopy; Sepsis ([place]); Enterocutaneous fistula; Intra-abdominal abscess ([place]); Acute peritonitis ([place]); DNR (do not resuscitate); Atelectasis of left lung; Pleural effusion, left; Anemia; Acute respiratory failure with hypoxia ([place]); Cellulitis of abdominal wall; Pressure injury of skin; Septic shock ([place]); Adenocarcinoma of appendix ([place]); MSSA bacteremia; and Catheter-related bloodstream infection on their problem list. ?  ?Observations/Objective: ? ?BP 110/70   Pulse 100   Temp 97.8 ?F (36.6 ?C)   Resp 18   Ht '5\' 10"'$  (1.778 m)   Wt 146 lb 9.6 oz (66.5 kg)   SpO2 97%   BMI 21.03 kg/m?  ? ?Appears  chronically ill & malnourished   ? ?HEENT - WNL.   EAC's patent . TM's ? Retracted. ?Neck - supple.  ?Chest - Clear equal BS. ?Cor - Nl HS. RRR w/o sig MGR. PP 1(+). No edema. ?MS- FROM w/o deformities.  Gait supported by walker.  ?ABD  - Soft & bowel sounds normal.   ?           - Slightly tender in Rt abdomen w/o guarding or RB.   ?           - Noted fistula in LLQ draining feculent brown liquid.   ?           -  No hernias, masses or organomegaly.  ?Neuro -  Nl w/o focal abnormalities. ? ?Assessment and Plan: ? ? ?1. Iron deficiency anemia due to chronic blood loss ? ?- CBC with Differential/Platelet ?- Iron, Total/Total Iron Binding Cap ?- Ferritin ? ?2. Hypothyroidism ? ?- TSH ? ?3. Enterocutaneous fistula - mid abdomen ? ? ?4. Medication management ? ?- CBC with Differential/Platelet ?- Iron, Total/Total Iron Binding Cap ?- TSH ?- Ferritin ? ? ?5 . Hearing Loss  ? ?- ENT Referral ? ? ?\ ?Follow Up Instructions: ? ?     Refilled Eliquis, Pantoprazole,  Famotidine, NIFEREX &  defer new Rx for Thyroid pending repeat thyroid lab.  ? ? ?    I discussed the assessment and treatment plan with the patient. The patient was provided an opportunity to ask questions and all were answered. The patient agreed with the plan and demonstrated an understanding of the instructions. ?  ?    The patient was advised to call back or seek an in-person evaluation if the symptoms worsen or if the condition fails to improve as anticipated. ? ? ?Kirtland Bouchard, MD ? ?

## 2022-01-27 ENCOUNTER — Other Ambulatory Visit: Payer: Self-pay | Admitting: Internal Medicine

## 2022-01-27 LAB — CBC WITH DIFFERENTIAL/PLATELET
Absolute Monocytes: 1537 cells/uL — ABNORMAL HIGH (ref 200–950)
Basophils Absolute: 42 cells/uL (ref 0–200)
Basophils Relative: 0.5 %
Eosinophils Absolute: 252 cells/uL (ref 15–500)
Eosinophils Relative: 3 %
HCT: 31.6 % — ABNORMAL LOW (ref 38.5–50.0)
Hemoglobin: 9.4 g/dL — ABNORMAL LOW (ref 13.2–17.1)
Lymphs Abs: 3242 cells/uL (ref 850–3900)
MCH: 22.6 pg — ABNORMAL LOW (ref 27.0–33.0)
MCHC: 29.7 g/dL — ABNORMAL LOW (ref 32.0–36.0)
MCV: 76 fL — ABNORMAL LOW (ref 80.0–100.0)
MPV: 9.9 fL (ref 7.5–12.5)
Monocytes Relative: 18.3 %
Neutro Abs: 3326 cells/uL (ref 1500–7800)
Neutrophils Relative %: 39.6 %
Platelets: 387 10*3/uL (ref 140–400)
RBC: 4.16 10*6/uL — ABNORMAL LOW (ref 4.20–5.80)
RDW: 22.2 % — ABNORMAL HIGH (ref 11.0–15.0)
Total Lymphocyte: 38.6 %
WBC: 8.4 10*3/uL (ref 3.8–10.8)

## 2022-01-27 LAB — TSH: TSH: 2.82 mIU/L (ref 0.40–4.50)

## 2022-01-27 LAB — FERRITIN: Ferritin: 41 ng/mL (ref 24–380)

## 2022-01-27 LAB — IRON, TOTAL/TOTAL IRON BINDING CAP
%SAT: 5 % (calc) — ABNORMAL LOW (ref 20–48)
Iron: 14 ug/dL — ABNORMAL LOW (ref 50–180)
TIBC: 292 mcg/dL (calc) (ref 250–425)

## 2022-01-27 LAB — CBC MORPHOLOGY

## 2022-01-28 NOTE — Progress Notes (Signed)
<><><><><><><><><><><><><><><><><><><><><><><><><><><><><><><><><> ?<><><><><><><><><><><><><><><><><><><><><><><><><><><><><><><><><> ?-   Test results slightly outside the reference range are not unusual. ?If there is anything important, I will review this with you,  ?otherwise it is considered normal test values.  ?If you have further questions,  ?please do not hesitate to contact me at the office or via My Chart.  ?<><><><><><><><><><><><><><><><><><><><><><><><><><><><><><><><><> ?<><><><><><><><><><><><><><><><><><><><><><><><><><><><><><><><><> ? ?-  CBC =- Stable -- Hbg remains 9.4 gm% ,  ? ?- But  Iron still dangerously Low which contributes to severe weakness & muscle cramps, spasms ? ?- Recommend increase your Iron pill up to 3 x ./day after Meals  ?<><><><><><><><><><><><><><><><><><><><><><><><><><><><><><><><><> ? ?- Thyroid is back in Normal range  ( for now)  - will continue to monitor closely ! ?<><><><><><><><><><><><><><><><><><><><><><><><><><><><><><><><><> ? ?- Recommend schedule a 6 week f/u w/ [facility] ?<><><><><><><><><><><><><><><><><><><><><><><><><><><><><><><><><> ? ? ? ? ? ? ? ? ? ? ? ?

## 2022-02-15 NOTE — Progress Notes (Unsigned)
Future Appointments  Date Time Provider Department  02/16/2022 11:00 AM [name], MD [facility]  03/08/2022 11:00 AM [name], MD [facility]    History of Present Illness:      Patient is a nice 69 to MWM with hx/o s/p  Appendectomy for mucinous adenocarcinoma of appendix, s/p colectomy, splenectomy, omentectomy, cholecystectomy in April 2022 who is s/p multiple abdominal surgeries for recurrent abdominal wall abscesses who presents now with :  c/o low grade fever x 3 days  to 101 deg   & "just don't feel good".   No respiratory or GU sx's. No N/V,  but has c/o chronic constipation since surgeries.   Medications        Not Taking    acetaminophen 325 MG tablet, Take 2 tablets (650 mg total) every 4 (four) hours as needed for mild pain (temp > 101.5). (Patient not taking: Reported on 12/26/2021)   apixaban (ELIQUIS) 5 MG TABS tablet, Take  1 tablet  2 x/day    iron polysaccharides (NIFEREX) 150 MG capsule, Take  n1 tablet  Daily   famotidine  40 MG tablet, Take 1 tablet at Night     pantoprazole 40 MG tablet, Take 1 tablet Daily f   polyethylene glycol  17 g packet, Take daily as needed    METAMUCIL 0.52 g capsule, Take 1 capsule daily.  Problem list He has Essential hypertension; Benign prostatic hyperplasia; GERD; Fibromyalgia; Testosterone Deficiency; Hyperlipidemia, mixed; Vitamin D deficiency; Medication management; Elevated serum immunoglobulin free light chains; Hx of adenomatous polyp of colon; Abnormal glucose; Erectile dysfunction; Obesity (BMI 30.0-34.9); Dysthymia; Recurrent cold sores; Polycythemia; Abnormality of colon-appendix on colonoscopy; Sepsis ([place]); Enterocutaneous fistula; Intra-abdominal abscess ([place]); Acute peritonitis ([place]); DNR (do not resuscitate); Atelectasis of left lung; Pleural effusion, left; Anemia; Acute respiratory failure with hypoxia ([place]); Cellulitis of abdominal wall; Pressure injury of skin; Septic shock ([place]); Adenocarcinoma of  appendix ([place]); MSSA bacteremia; and Catheter-related bloodstream infection on their problem list.   Observations/Objective:  BP (!) 89/57   Pulse 67   Temp (!) 96.9 F (36.1 C)   Ht '5\' 10"'$  (1.778 m)   Wt 151 lb (68.5 kg)   SpO2 95%   BMI 21.67 kg/m   HEENT - WNL. Neck - supple.  Chest - Clear equal BS. Cor - Nl HS. RRR w/o sig MGR. PP 1(+). No edema. ABD  - Soft & bowel sounds normal.              - Slightly diffuse tender  w/o guarding or RB.              - Noted fistula in LLQ draining feculent brown liquid.              -  No hernias, masses or organomegaly.  MS- FROM w/o deformities.  Gait Nl. Neuro -  Nl w/o focal abnormalities.   Assessment and Plan:  1. Fever   - CBC with Differential/Platelet - COMPLETE METABOLIC PANEL WITH GFR - Urinalysis, Routine w reflex microscopic - Urine Culture  - DG Abd Acute W/Chest; Future  - cephALEXin 500 MG ; Take  1 capsule  4 x /day -Disp: 40 caps - doxycycline 100 MG  Take  1 capsule  2 x /day    Disp: 20 caps  2. Generalized abdominal pain  - DG Abd Acute W/Chest; Future  3. Enterocutaneous fistula  - DG Abd Acute W/Chest; Future  4. Abdominal wall abscess  - DG Abd Acute W/Chest  5. Subacute cough  - DG Abd Acute W/Chest   Follow Up Instructions:        I discussed the assessment and treatment plan with the patient. The patient was provided an opportunity to ask questions and all were answered. The patient agreed with the plan and demonstrated an understanding of the instructions.       The patient was advised to call back or seek an in-person evaluation if the symptoms worsen or if the condition fails to improve as anticipated.   Kirtland Bouchard, MD

## 2022-02-16 ENCOUNTER — Ambulatory Visit
Admission: RE | Admit: 2022-02-16 | Discharge: 2022-02-16 | Disposition: A | Discharge status: Home / Self Care | Payer: Medicare Other | Source: Ambulatory Visit | Attending: Internal Medicine | Admitting: Internal Medicine

## 2022-02-16 ENCOUNTER — Encounter: Payer: Self-pay | Admitting: Internal Medicine

## 2022-02-16 ENCOUNTER — Ambulatory Visit (INDEPENDENT_AMBULATORY_CARE_PROVIDER_SITE_OTHER): Payer: Medicare Other | Admitting: Internal Medicine

## 2022-02-16 VITALS — BP 89/57 | HR 67 | Temp 96.9°F | Ht 70.0 in | Wt 151.0 lb

## 2022-02-16 DIAGNOSIS — R1084 Generalized abdominal pain: Secondary | ICD-10-CM

## 2022-02-16 DIAGNOSIS — R509 Fever, unspecified: Secondary | ICD-10-CM

## 2022-02-16 DIAGNOSIS — Z79899 Other long term (current) drug therapy: Secondary | ICD-10-CM

## 2022-02-16 DIAGNOSIS — L02211 Cutaneous abscess of abdominal wall: Secondary | ICD-10-CM | POA: Diagnosis not present

## 2022-02-16 DIAGNOSIS — K632 Fistula of intestine: Secondary | ICD-10-CM | POA: Diagnosis not present

## 2022-02-16 DIAGNOSIS — R052 Subacute cough: Secondary | ICD-10-CM

## 2022-02-16 MED ORDER — DOXYCYCLINE HYCLATE 100 MG PO CAPS: ORAL_CAPSULE | ORAL | 0 refills | Status: DC

## 2022-02-16 MED ORDER — CEPHALEXIN 500 MG PO CAPS: ORAL_CAPSULE | ORAL | 0 refills | Status: DC

## 2022-02-17 LAB — URINALYSIS, ROUTINE W REFLEX MICROSCOPIC
Bacteria, UA: NONE SEEN /HPF
Bilirubin Urine: NEGATIVE
Glucose, UA: NEGATIVE
Hgb urine dipstick: NEGATIVE
Ketones, ur: NEGATIVE
Leukocytes,Ua: NEGATIVE
Nitrite: NEGATIVE
RBC / HPF: NONE SEEN /HPF (ref 0–2)
Specific Gravity, Urine: 1.023 (ref 1.001–1.035)
WBC, UA: NONE SEEN /HPF (ref 0–5)
pH: 5.5 (ref 5.0–8.0)

## 2022-02-17 LAB — URINE CULTURE
MICRO NUMBER:: 13447862
Result:: NO GROWTH
SPECIMEN QUALITY:: ADEQUATE

## 2022-02-17 LAB — CBC WITH DIFFERENTIAL/PLATELET
Absolute Monocytes: 1859 cells/uL — ABNORMAL HIGH (ref 200–950)
Basophils Absolute: 38 cells/uL (ref 0–200)
Basophils Relative: 0.6 %
Eosinophils Absolute: 82 cells/uL (ref 15–500)
Eosinophils Relative: 1.3 %
HCT: 29.9 % — ABNORMAL LOW (ref 38.5–50.0)
Hemoglobin: 9.3 g/dL — ABNORMAL LOW (ref 13.2–17.1)
Lymphs Abs: 2955 cells/uL (ref 850–3900)
MCH: 22.6 pg — ABNORMAL LOW (ref 27.0–33.0)
MCHC: 31.1 g/dL — ABNORMAL LOW (ref 32.0–36.0)
MCV: 72.7 fL — ABNORMAL LOW (ref 80.0–100.0)
MPV: 10.2 fL (ref 7.5–12.5)
Monocytes Relative: 29.5 %
Neutro Abs: 1367 cells/uL — ABNORMAL LOW (ref 1500–7800)
Neutrophils Relative %: 21.7 %
Platelets: 349 10*3/uL (ref 140–400)
RBC: 4.11 10*6/uL — ABNORMAL LOW (ref 4.20–5.80)
RDW: 19.1 % — ABNORMAL HIGH (ref 11.0–15.0)
Total Lymphocyte: 46.9 %
WBC: 6.3 10*3/uL (ref 3.8–10.8)

## 2022-02-17 LAB — MICROSCOPIC MESSAGE

## 2022-02-17 LAB — COMPLETE METABOLIC PANEL WITH GFR
AG Ratio: 0.9 (calc) — ABNORMAL LOW (ref 1.0–2.5)
ALT: 11 U/L (ref 9–46)
AST: 11 U/L (ref 10–35)
Albumin: 3 g/dL — ABNORMAL LOW (ref 3.6–5.1)
Alkaline phosphatase (APISO): 65 U/L (ref 35–144)
BUN: 13 mg/dL (ref 7–25)
CO2: 26 mmol/L (ref 20–32)
Calcium: 9.1 mg/dL (ref 8.6–10.3)
Chloride: 102 mmol/L (ref 98–110)
Creat: 0.99 mg/dL (ref 0.70–1.28)
Globulin: 3.4 g/dL (calc) (ref 1.9–3.7)
Glucose, Bld: 127 mg/dL — ABNORMAL HIGH (ref 65–99)
Potassium: 3.9 mmol/L (ref 3.5–5.3)
Sodium: 137 mmol/L (ref 135–146)
Total Bilirubin: 0.3 mg/dL (ref 0.2–1.2)
Total Protein: 6.4 g/dL (ref 6.1–8.1)
eGFR: 80 mL/min/{1.73_m2} (ref >=60)

## 2022-02-18 NOTE — Progress Notes (Signed)
<><><><><><><><><><><><><><><><><><><><><><><><><><><><><><><><><> <><><><><><><><><><><><><><><><><><><><><><><><><><><><><><><><><>  -   Reviewed with wife per lab note  <><><><><><><><><><><><><><><><><><><><><><><><><><><><><><><><><> <><><><><><><><><><><><><><><><><><><><><><><><><><><><><><><><><>  -

## 2022-02-18 NOTE — Progress Notes (Signed)
<><><><><><><><><><><><><><><><><><><><><><><><><><><><><><><><><> <><><><><><><><><><><><><><><><><><><><><><><><><><><><><><><><><>  -   Labs reviewed with wife, Curtis Price &  also Abd X-rays showing large  fecal impaction which wife relate required aggressive GI prep &  enemas during last hospitalization.   - Recommended trying Dulcolax supp & Fleets enemas and also take Daily Miralax.  Recc trying "double dose" Miralax every 1-2 hours .  - Cautioned wife if develops Abdominal distention & N/V to go to the ER for recheck.   <><><><><><><><><><><><><><><><><><><><><><><><><><><><><><><><><> <><><><><><><><><><><><><><><><><><><><><><><><><><><><><><><><><>  -

## 2022-03-08 ENCOUNTER — Ambulatory Visit: Payer: Medicare Other | Admitting: Internal Medicine

## 2022-03-08 NOTE — Progress Notes (Signed)
W  I  F  E    R  E  S C  H  E  D  U  L  E  D                                                                                                                                                                                                                                                                 Future Appointments  Date Time Provider Department  03/08/2022 11:00 AM [name], MD [facility]    History of Present Illness:                                    Patient is a nice 89 to MWM  s/p  Appendectomy for mucinous adenocarcinoma of appendix, s/p colectomy, splenectomy, omentectomy, cholecystectomy in April 2022,  who also is s/p multiple abdominal surgeries for recurrent abdominal wall abscesses who was evaluated May 25 with  CXR  suspect for patchy PNA in Rt mid & lower lung and & Abd Xrays suggesting Large fecal Impaction  and suspected Small bowel ileus or enteritis. Patient was prescribed Doxycycline & Cephalexin .      Then patient was seen on 5/[facility] at [facility] by his GI surgeon & Abd CT scan was done and showed  features concerning for enterocutaneous fistula with moderate amount of gas within the peritoneum in addition to peritoneal thickening and fluid &  matted and thickened small bowel loops with features of adhesion. No evidence of bowel obstruction.     Patient was recommended continue protein supplementation at home, digital disimpaction and enemas,  IV iron as an outpatient  & to RTC in 2 months in anticipation of possible fistula take down procedure in future     Medications    acetaminophen  325 MG tablet, Take 2 tablets  every 4  hours as needed    apixaban (ELIQUIS) 5 MG TABS tablet, Take  1 tablet   2 x/day (every 12 hours)  to Prevent Blood Clots   iron polysaccharides (NIFEREX) 150 MG capsule, Take  n1 tablet  Daily  for Iron Deficiency   famotidine  40 MG  tablet, Take 1 tablet at Night  for Heart burn & Acid Indigestion   pantoprazole 40 MG tablet, Take 1 tablet Daily for Indigestion & Acid Reflux   polyethylene glycol  17 g packet, Take daily as needed for moderate constipation.   psyllium 0.52 g capsule, Take 1 capsule daily.  Problem list  He has Essential hypertension; Benign prostatic hyperplasia; GERD; Fibromyalgia; Testosterone Deficiency; Hyperlipidemia, mixed; Vitamin D deficiency; Elevated serum immunoglobulin free light chains; Hx of adenomatous polyp of colon; Abnormal glucose; Erectile dysfunction; Obesity (BMI 30.0-34.9); Dysthymia; Recurrent cold sores; Enterocutaneous fistula; Intra-abdominal abscess ([place]); DNR (do not resuscitate); Anemia; Cellulitis of abdominal wall; and Adenocarcinoma of appendix ([place]) on their problem list.   Observations/Objective:  There were no vitals taken for this visit.  HEENT - WNL. Neck - supple.  Chest - Clear equal BS. Cor - Nl HS. RRR w/o sig MGR. PP 1(+). No edema. MS- FROM w/o deformities.  Gait Nl. Neuro -  Nl w/o focal abnormalities.   Assessment and Plan:  1. Iron deficiency anemia due to chronic blood loss  - CBC with Differential/Platelet  2. 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 Severe protein-calorie malnutrition (HCC)  - COMPLETE METABOLIC PANEL WITH GFR  4. Gastroesophageal reflux disease  - CBC with Differential/Platelet  5. Enterocutaneous fistula   6. Fatigue  - CBC with Differential/Platelet - COMPLETE METABOLIC PANEL WITH GFR - Magnesium - TSH - Vitamin B12  7. Medication management  - CBC with Differential/Platelet - COMPLETE METABOLIC PANEL WITH GFR - Magnesium - TSH - Vitamin B12   Follow Up Instructions:         I discussed the assessment and treatment plan with the patient. The patient was provided an opportunity to ask questions and all were answered. The patient agreed with the plan and demonstrated an understanding of the instructions.       The patient was advised to call back or seek an in-person evaluation if the symptoms worsen or if the condition fails to improve as anticipated.   Kirtland Bouchard, MD

## 2022-03-21 ENCOUNTER — Ambulatory Visit (INDEPENDENT_AMBULATORY_CARE_PROVIDER_SITE_OTHER): Payer: Medicare Other | Admitting: Internal Medicine

## 2022-03-21 ENCOUNTER — Encounter: Payer: Self-pay | Admitting: Internal Medicine

## 2022-03-21 VITALS — BP 92/54 | HR 125 | Temp 97.7°F | Resp 16 | Ht 70.0 in | Wt 134.0 lb

## 2022-03-21 DIAGNOSIS — R5383 Other fatigue: Secondary | ICD-10-CM

## 2022-03-21 DIAGNOSIS — I10 Essential (primary) hypertension: Secondary | ICD-10-CM | POA: Diagnosis not present

## 2022-03-21 DIAGNOSIS — Z79899 Other long term (current) drug therapy: Secondary | ICD-10-CM

## 2022-03-21 DIAGNOSIS — R7309 Other abnormal glucose: Secondary | ICD-10-CM | POA: Diagnosis not present

## 2022-03-21 DIAGNOSIS — E43 Unspecified severe protein-calorie malnutrition: Secondary | ICD-10-CM

## 2022-03-21 DIAGNOSIS — E782 Mixed hyperlipidemia: Secondary | ICD-10-CM | POA: Diagnosis not present

## 2022-03-21 DIAGNOSIS — D511 Vitamin B12 deficiency anemia due to selective vitamin B12 malabsorption with proteinuria: Secondary | ICD-10-CM

## 2022-03-21 DIAGNOSIS — D500 Iron deficiency anemia secondary to blood loss (chronic): Secondary | ICD-10-CM

## 2022-03-21 DIAGNOSIS — K632 Fistula of intestine: Secondary | ICD-10-CM

## 2022-03-21 DIAGNOSIS — E559 Vitamin D deficiency, unspecified: Secondary | ICD-10-CM

## 2022-03-21 DIAGNOSIS — K219 Gastro-esophageal reflux disease without esophagitis: Secondary | ICD-10-CM

## 2022-03-21 DIAGNOSIS — E039 Hypothyroidism, unspecified: Secondary | ICD-10-CM

## 2022-03-21 MED ORDER — RIFAMPIN 300 MG PO CAPS: ORAL_CAPSULE | ORAL | 0 refills | Status: AC

## 2022-03-21 MED ORDER — METRONIDAZOLE 500 MG PO TABS: ORAL_TABLET | ORAL | 0 refills | Status: AC

## 2022-03-21 NOTE — Progress Notes (Signed)
Future Appointments  Date Time Provider Department  03/21/2022  4:00 PM Lucky Cowboy, MD [id number]    History of Present Illness:  Patient is a nice 65 to MWM  s/p  Appendectomy for mucinous adenocarcinoma of appendix, s/p colectomy, splenectomy, omentectomy, cholecystectomy in April 2022,  who also is s/p multiple abdominal surgeries for recurrent abdominal wall abscesses who was evaluated May 25 with  CXR  suspect for patchy PNA in Rt mid & lower lung and & Abd Xrays suggesting Large fecal Impaction  and suspected Small bowel ileus or enteritis. Patient has completed a 10 day antibiotic course with  Doxycycline & Cephalexin since last OV. Patient's wife reports moderated inflamed skin area around the lower abdominal wall fistula & shows pictures of large amounts of creamy yellowish green pus draining from the fistulas.       Patient reports ongoing issues with chronic constipation despite various laxatives & enemas.                                            Then patient was seen on 5/31 at [facility] by his GI surgeon & Abd CT scan was done and showed  features concerning for enterocutaneous fistula with moderate amount of gas within the peritoneum in addition to peritoneal thickening and fluid &  matted and thickened small bowel loops with features of adhesion. No evidence of bowel obstruction.                                          Patient was recommended continue protein supplementation at home, digital disimpaction and enemas,  IV iron as an outpatient  & to RTC in 2 months in anticipation of possible fistula take down procedure in future    Medications  Current Outpatient Medications (Endocrine & Metabolic):    levothyroxine (SYNTHROID) 50 MCG tablet, Take 1 tablet daily on an empty stomach with only water for 30 minutes & no Antacid meds, Calcium or Magnesium for 4 hours & avoid Biotin    Current Outpatient Medications (Analgesics):    acetaminophen (TYLENOL)  325 MG tablet, Take 2 tablets (650 mg total) by mouth every 4 (four) hours as needed for mild pain (temp > 101.5).  Current Outpatient Medications (Hematological):    apixaban (ELIQUIS) 5 MG TABS tablet, Take  1 tablet  2 x/day (every 12 hours)  to Prevent Blood Clots   iron polysaccharides (NIFEREX) 150 MG capsule, Take  n1 tablet  Daily  for Iron Deficiency  Current Outpatient Medications (Other):    metroNIDAZOLE (FLAGYL) 500 MG tablet, Take  1 tablet  3 x /day  with Meals for Infection   rifampin (RIFADIN) 300 MG capsule, Take  1 capsule  2 x /day  with Meals for Infection   famotidine (PEPCID) 40 MG tablet, Take 1 tablet at Night  for Heart burn & Acid Indigestion   pantoprazole (PROTONIX) 40 MG tablet, Take 1 tablet Daily for Indigestion & Acid Reflux   polyethylene glycol (MIRALAX / GLYCOLAX) 17 g packet, Take 17 g by mouth daily as needed for moderate constipation.   psyllium (METAMUCIL) 0.52 g capsule, Take 1 capsule (0.52 g total) by mouth daily.  Problem list He has Essential hypertension; Benign prostatic hyperplasia; GERD; Fibromyalgia; Testosterone Deficiency;  Hyperlipidemia, mixed; Vitamin D deficiency; Elevated serum immunoglobulin free light chains; Hx of adenomatous polyp of colon; Abnormal glucose; Erectile dysfunction; Obesity (BMI 30.0-34.9); Dysthymia; Recurrent cold sores; Enterocutaneous fistula; Intra-abdominal abscess (HCC); DNR (do not resuscitate); Anemia; Cellulitis of abdominal wall; and Adenocarcinoma of appendix (HCC) on their problem list.   Observations/Objective:  BP (!) 92/54   Pulse (!) 125   Temp 97.7 F (36.5 C)   Resp 16   Ht 5\' 10"  (1.778 m)   Wt 134 lb (60.8 kg) Comment: Patient reported  SpO2 99%   BMI 19.23 kg/m   No cyanosis, icterus or rash.   HEENT - WNL . Bilat hearing aides.  Neck - supple.  Chest - Clear equal BS. Cor - Nl HS. RRR w/o sig MGR. PP 1(+). No edema. Abd - Moderate tenderness over Rt abdomen above the  "bagged" draining  fistulas  with an apparent sensation of induration over the Rt abdomen. Left abd is softer & less tender . No guarding or rebound.  MS- FROM w/o deformities.  Gait Nl. Neuro -  Nl w/o focal abnormalities.   Assessment and Plan:   1. Essential hypertension  - CBC with Differential/Platelet - COMPLETE METABOLIC PANEL WITH GFR - Magnesium - TSH  2. Hyperlipidemia, mixed  - Lipid panel - TSH  3. Abnormal glucose  - Hemoglobin A1c - Insulin, random  4. Vitamin D deficiency  - VITAMIN D 25 Hydroxy   5. Hypothyroidism  - TSH  6. Iron deficiency anemia due to chronic blood loss  - CBC with Differential/Platelet  7. 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 Severe protein-calorie malnutrition (HCC)  - COMPLETE METABOLIC PANEL WITH GFR - Magnesium  9. 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 Fatigue  - CBC with Differential/Platelet - COMPLETE METABOLIC PANEL WITH GFR - TSH - Vitamin B12  12. Medication management  - CBC with Differential/Platelet - COMPLETE METABOLIC PANEL WITH GFR - Magnesium - Lipid panel - TSH - Hemoglobin A1c - Insulin, random - VITAMIN D 25 Hydroxy - Vitamin B12   Follow Up Instructions:        I discussed the assessment and treatment plan with the patient & wife . They were provided an opportunity to ask questions and all were answered. They agreed with the plan and demonstrated an understanding of the instructions. Patient & wife encouraged liberal g Miralax up to 30 cc every 30-60 minutes until has a BM.        They were advised to call back or seek an in-person evaluation if the symptoms worsen or if the condition fails to improve as anticipated.   Marinus Maw, MD

## 2022-03-22 LAB — LIPID PANEL
Cholesterol: 134 mg/dL (ref <200)
HDL: 41 mg/dL (ref >=40)
LDL Cholesterol (Calc): 69 mg/dL (calc)
Non-HDL Cholesterol (Calc): 93 mg/dL (calc) (ref <130)
Total CHOL/HDL Ratio: 3.3 (calc) (ref <5.0)
Triglycerides: 166 mg/dL — ABNORMAL HIGH (ref <150)

## 2022-03-22 LAB — HEMOGLOBIN A1C
Hgb A1c MFr Bld: 5.3 % of total Hgb (ref <5.7)
Mean Plasma Glucose: 105 mg/dL
eAG (mmol/L): 5.8 mmol/L

## 2022-03-22 LAB — CBC WITH DIFFERENTIAL/PLATELET
Absolute Monocytes: 1450 cells/uL — ABNORMAL HIGH (ref 200–950)
Basophils Absolute: 32 cells/uL (ref 0–200)
Basophils Relative: 0.4 %
Eosinophils Absolute: 146 cells/uL (ref 15–500)
Eosinophils Relative: 1.8 %
HCT: 28.6 % — ABNORMAL LOW (ref 38.5–50.0)
Hemoglobin: 8.7 g/dL — ABNORMAL LOW (ref 13.2–17.1)
Lymphs Abs: 2122 cells/uL (ref 850–3900)
MCH: 22.1 pg — ABNORMAL LOW (ref 27.0–33.0)
MCHC: 30.4 g/dL — ABNORMAL LOW (ref 32.0–36.0)
MCV: 72.8 fL — ABNORMAL LOW (ref 80.0–100.0)
MPV: 10.2 fL (ref 7.5–12.5)
Monocytes Relative: 17.9 %
Neutro Abs: 4350 cells/uL (ref 1500–7800)
Neutrophils Relative %: 53.7 %
Platelets: 466 10*3/uL — ABNORMAL HIGH (ref 140–400)
RBC: 3.93 10*6/uL — ABNORMAL LOW (ref 4.20–5.80)
RDW: 17.1 % — ABNORMAL HIGH (ref 11.0–15.0)
Total Lymphocyte: 26.2 %
WBC: 8.1 10*3/uL (ref 3.8–10.8)

## 2022-03-22 LAB — COMPLETE METABOLIC PANEL WITH GFR
AG Ratio: 0.9 (calc) — ABNORMAL LOW (ref 1.0–2.5)
ALT: 14 U/L (ref 9–46)
AST: 17 U/L (ref 10–35)
Albumin: 3.1 g/dL — ABNORMAL LOW (ref 3.6–5.1)
Alkaline phosphatase (APISO): 102 U/L (ref 35–144)
BUN: 13 mg/dL (ref 7–25)
CO2: 26 mmol/L (ref 20–32)
Calcium: 9.1 mg/dL (ref 8.6–10.3)
Chloride: 100 mmol/L (ref 98–110)
Creat: 0.84 mg/dL (ref 0.70–1.28)
Globulin: 3.4 g/dL (calc) (ref 1.9–3.7)
Glucose, Bld: 113 mg/dL — ABNORMAL HIGH (ref 65–99)
Potassium: 4.6 mmol/L (ref 3.5–5.3)
Sodium: 135 mmol/L (ref 135–146)
Total Bilirubin: 0.3 mg/dL (ref 0.2–1.2)
Total Protein: 6.5 g/dL (ref 6.1–8.1)
eGFR: 92 mL/min/{1.73_m2} (ref >=60)

## 2022-03-22 LAB — VITAMIN B12: Vitamin B-12: 713 pg/mL (ref 200–1100)

## 2022-03-22 LAB — VITAMIN D 25 HYDROXY (VIT D DEFICIENCY, FRACTURES): Vit D, 25-Hydroxy: 37 ng/mL (ref 30–100)

## 2022-03-22 LAB — MAGNESIUM: Magnesium: 1.7 mg/dL (ref 1.5–2.5)

## 2022-03-22 LAB — INSULIN, RANDOM: Insulin: 11.9 u[IU]/mL

## 2022-03-22 LAB — TSH: TSH: 1.68 mIU/L (ref 0.40–4.50)

## 2022-03-22 NOTE — Progress Notes (Signed)
<><><><><><><><><><><><><><><><><><><><><><><><><><><><><><><><><> <><><><><><><><><><><><><><><><><><><><><><><><><><><><><><><><><>  -    WBC - Normal - Great OK  - Hgb  / Red cell count = 8.7 gm%, so recommend that you increase your iron tablets to 3 x /day after meals   <><><><><><><><><><><><><><><><><><><><><><><><><><><><><><><><><>  - Total Chol = 134 - Excellent  <><><><><><><><><><><><><><><><><><><><><><><><><><><><><><><><><>  -  Magnesium  -   1.5   -  Is extremely  low   - goal is betw 2.0 - 2.5,   - So..............Marland Kitchen  Recommend that you take Magnesium 500 mg tablet  3 x /day daily   - also important to eat lots of  leafy green vegetables   - spinach - Kale - collards - greens - okra - asparagus  - broccoli - quinoa - squash - almonds   - black, red, white beans  -  peas - green beans <><><><><><><><><><><><><><><><><><><><><><><><><><><><><><><><><>  - A1c = 5.3% - Normal - No Diabetes <><><><><><><><><><><><><><><><><><><><><><><><><><><><><><><><><>  - Vitamin D = 37  is very very  low   - Ideal or goal is 50---70-100 range.   - So recommend that you take Vitamin  D 10,000 units  /day   <><><><><><><><><><><><><><><><><><><><><><><><><><><><><><><><><> <><><><><><><><><><><><><><><><><><><><><><><><><><><><><><><><><>

## 2022-03-23 ENCOUNTER — Telehealth: Payer: Self-pay

## 2022-03-23 NOTE — Telephone Encounter (Signed)
The patient's wife called to report that the patient was only putting out 700/800 cc of urine and still unable to produce a stool. She was advised per Dr. Melford Aase that the patient should be assessed at the emergency room [facility] or [facility] for IV fluids and imaging to see if he has a blockage. The patient's wife voiced verbal understanding to this staff member.

## 2022-03-31 ENCOUNTER — Other Ambulatory Visit: Payer: Self-pay | Admitting: Internal Medicine

## 2022-03-31 DIAGNOSIS — E039 Hypothyroidism, unspecified: Secondary | ICD-10-CM

## 2022-04-11 ENCOUNTER — Ambulatory Visit: Payer: Medicare Other | Admitting: Internal Medicine

## 2022-05-26 DEATH — deceased

## 2022-07-23 IMAGING — DX DG CHEST 1V PORT
1 series · 1 of 1 positions shown · non-contrast
Comparison: October 12, 2017

CLINICAL DATA: Central line placement

EXAM:
PORTABLE CHEST 1 VIEW

[chest]
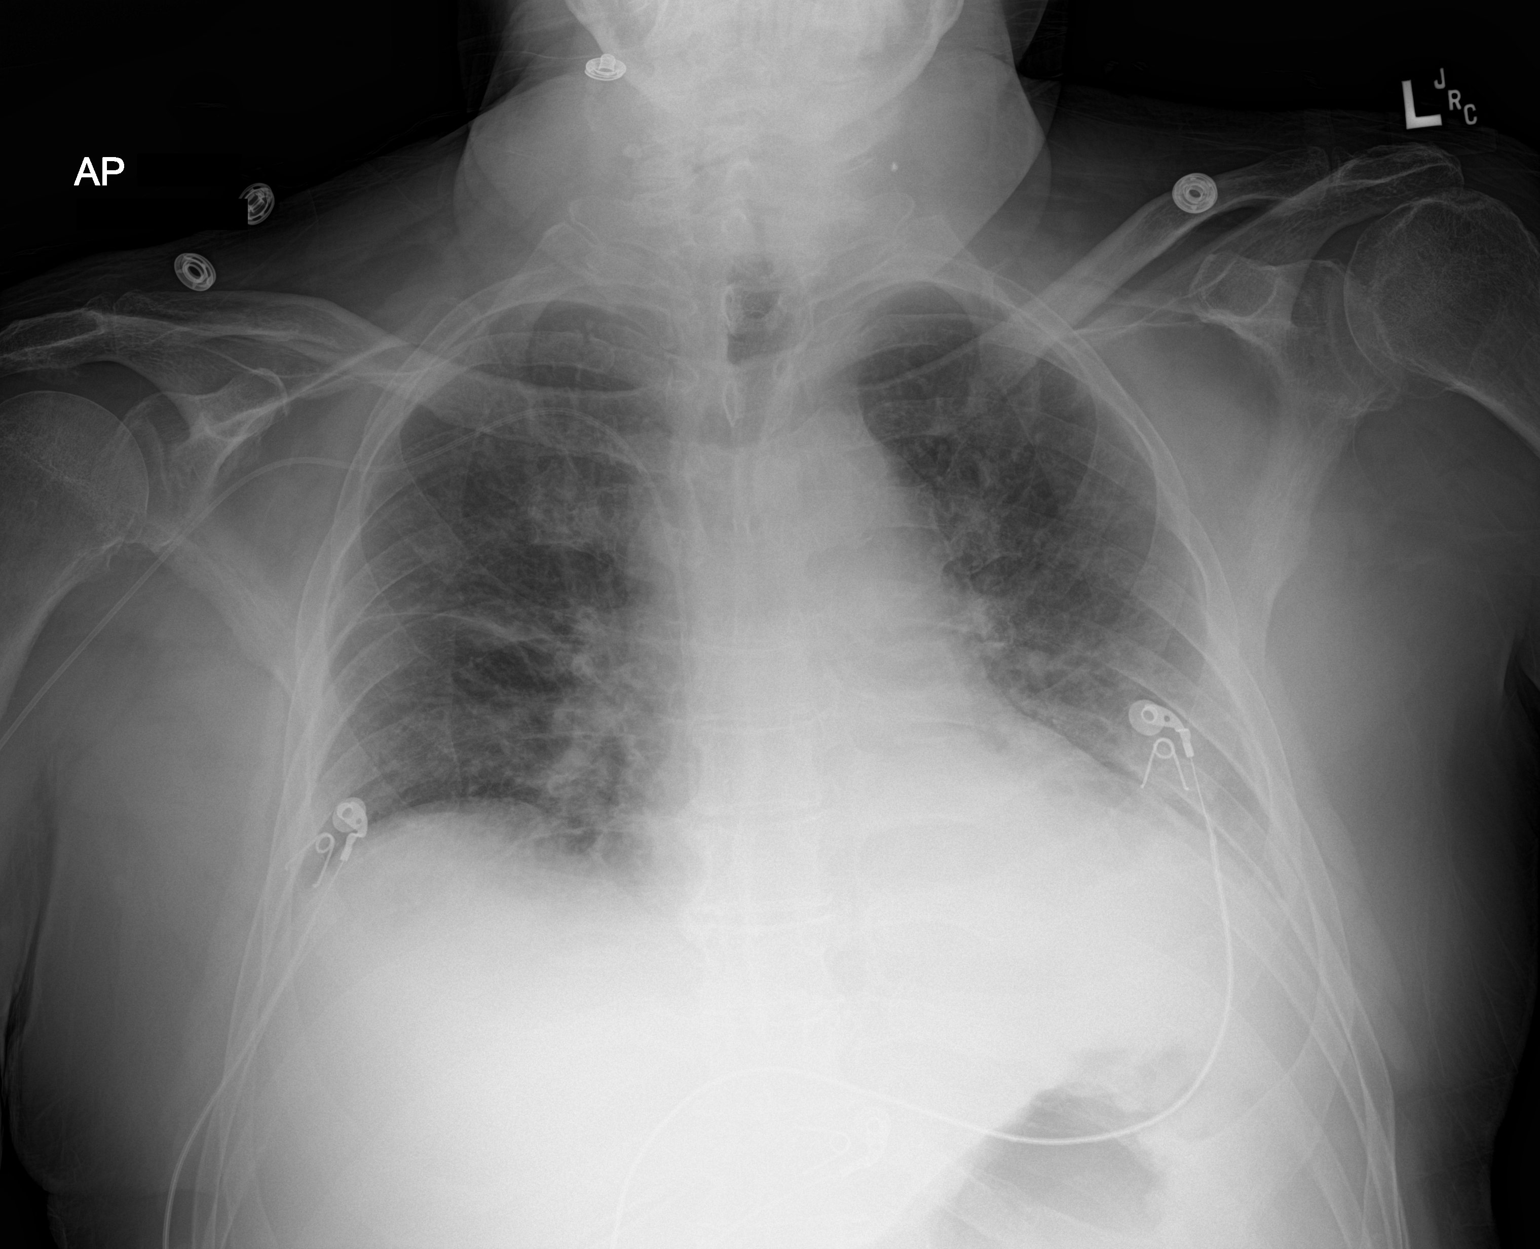

[1 of 1 positions shown; findings below may reference images not displayed]

FINDINGS: A new right PICC line is been placed, terminating centrally near the
caval atrial junction. No pneumothorax. Probable small effusion with
underlying opacity on the left. Increased interstitial markings in
the lungs. The cardiomediastinal silhouette is stable.
IMPRESSION: 1. The new right PICC line is in good position terminating centrally
near the caval atrial junction. No pneumothorax.
2. Probable small left effusion with underlying opacity, probably
atelectasis.
3. Suggested pulmonary venous congestion/mild edema.

## 2022-07-24 IMAGING — DX DG CHEST 1V PORT
1 series · 1 of 1 positions shown · non-contrast
Comparison: March 20, 2021.

CLINICAL DATA: Cough.

EXAM:
PORTABLE CHEST 1 VIEW

[chest ap]
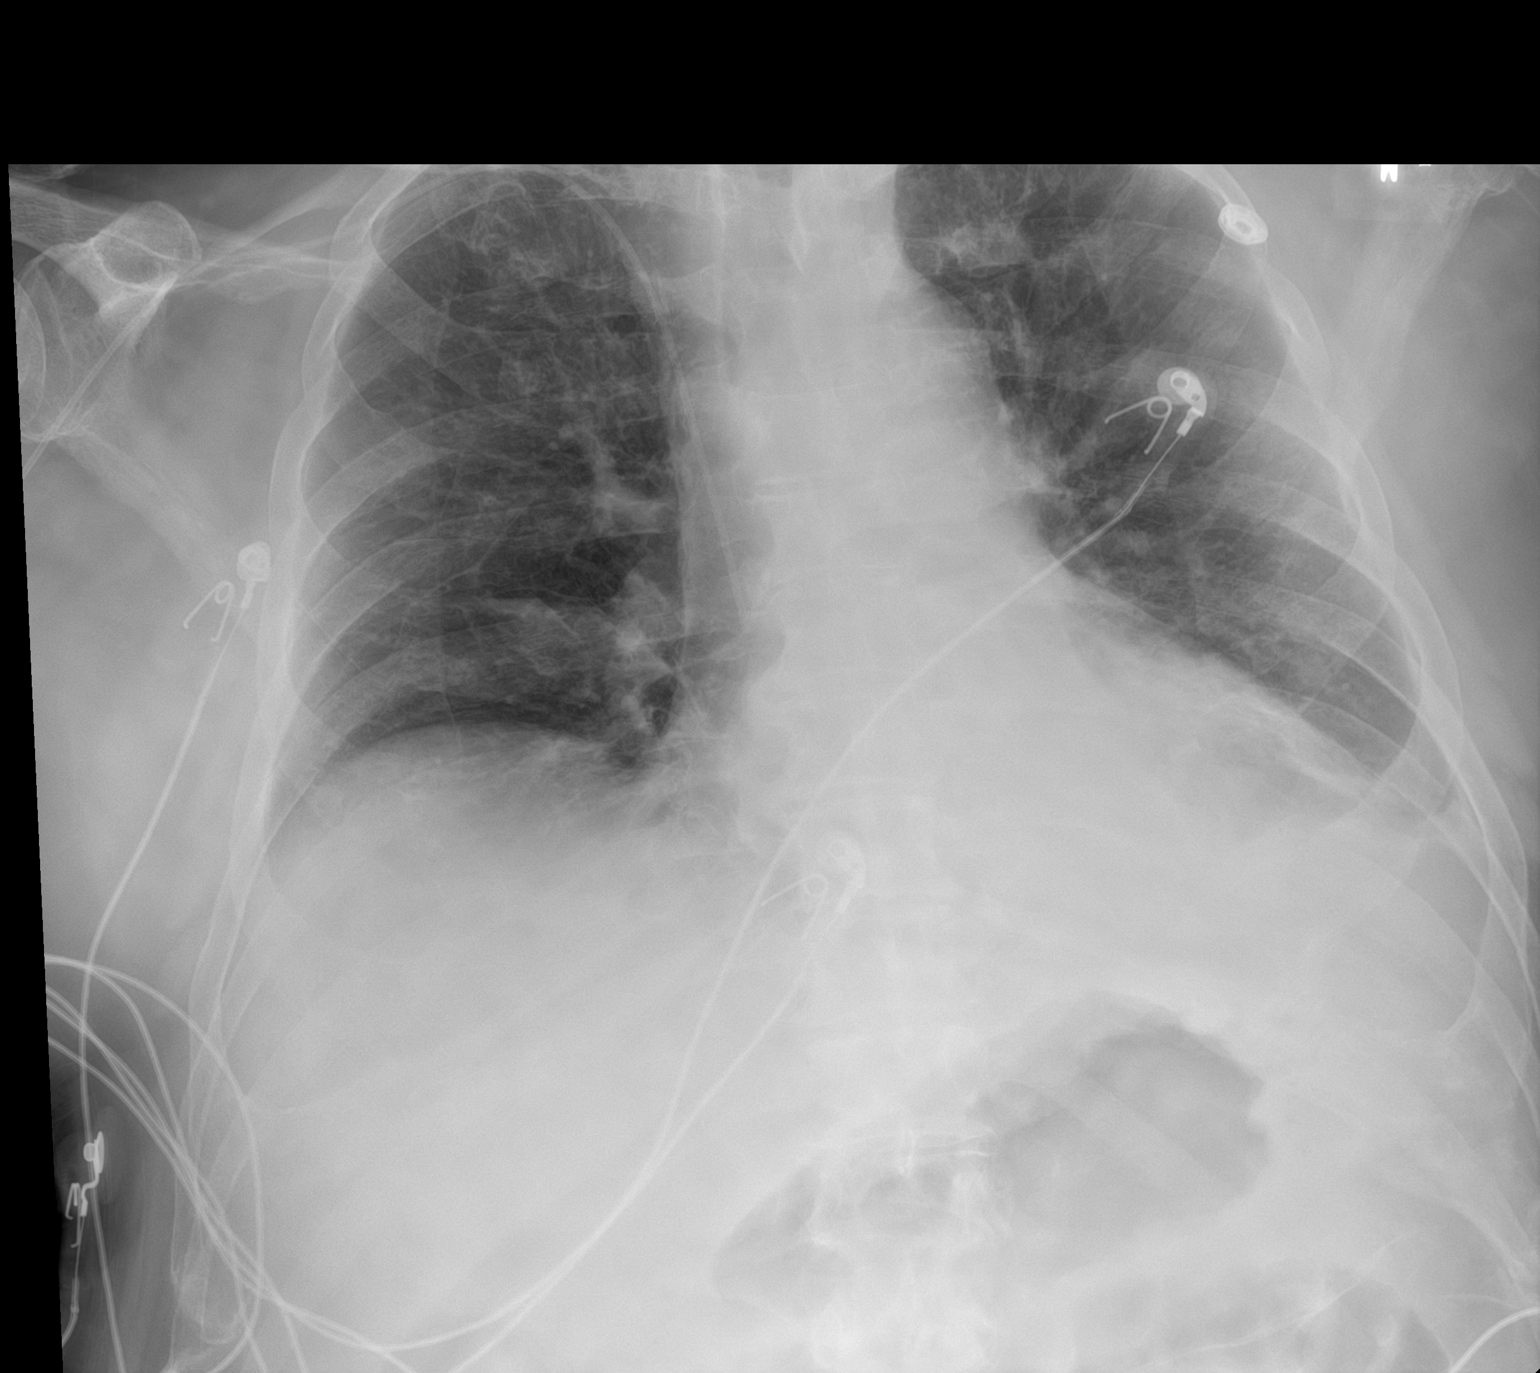

[1 of 1 positions shown; findings below may reference images not displayed]

FINDINGS: Stable cardiomediastinal silhouette. No pneumothorax is noted.
Right-sided PICC line is unchanged in position. Right lung is clear.
Mild left basilar atelectasis is noted with small left pleural
effusion. Bony thorax is unremarkable.
IMPRESSION: Mild left basilar subsegmental atelectasis with small left pleural
effusion.

## 2022-07-26 IMAGING — DX DG ABDOMEN 1V
1 series · 2 of 2 positions shown · non-contrast
Comparison: CT abdomen pelvis dated 09/28/2020.

CLINICAL DATA: 74-year-old male with mucinous adenoma.

EXAM:
ABDOMEN - 1 VIEW

[Series 1: abdomen · 0.14mm/px · 2 of 2 slices shown]
[im 1/2]
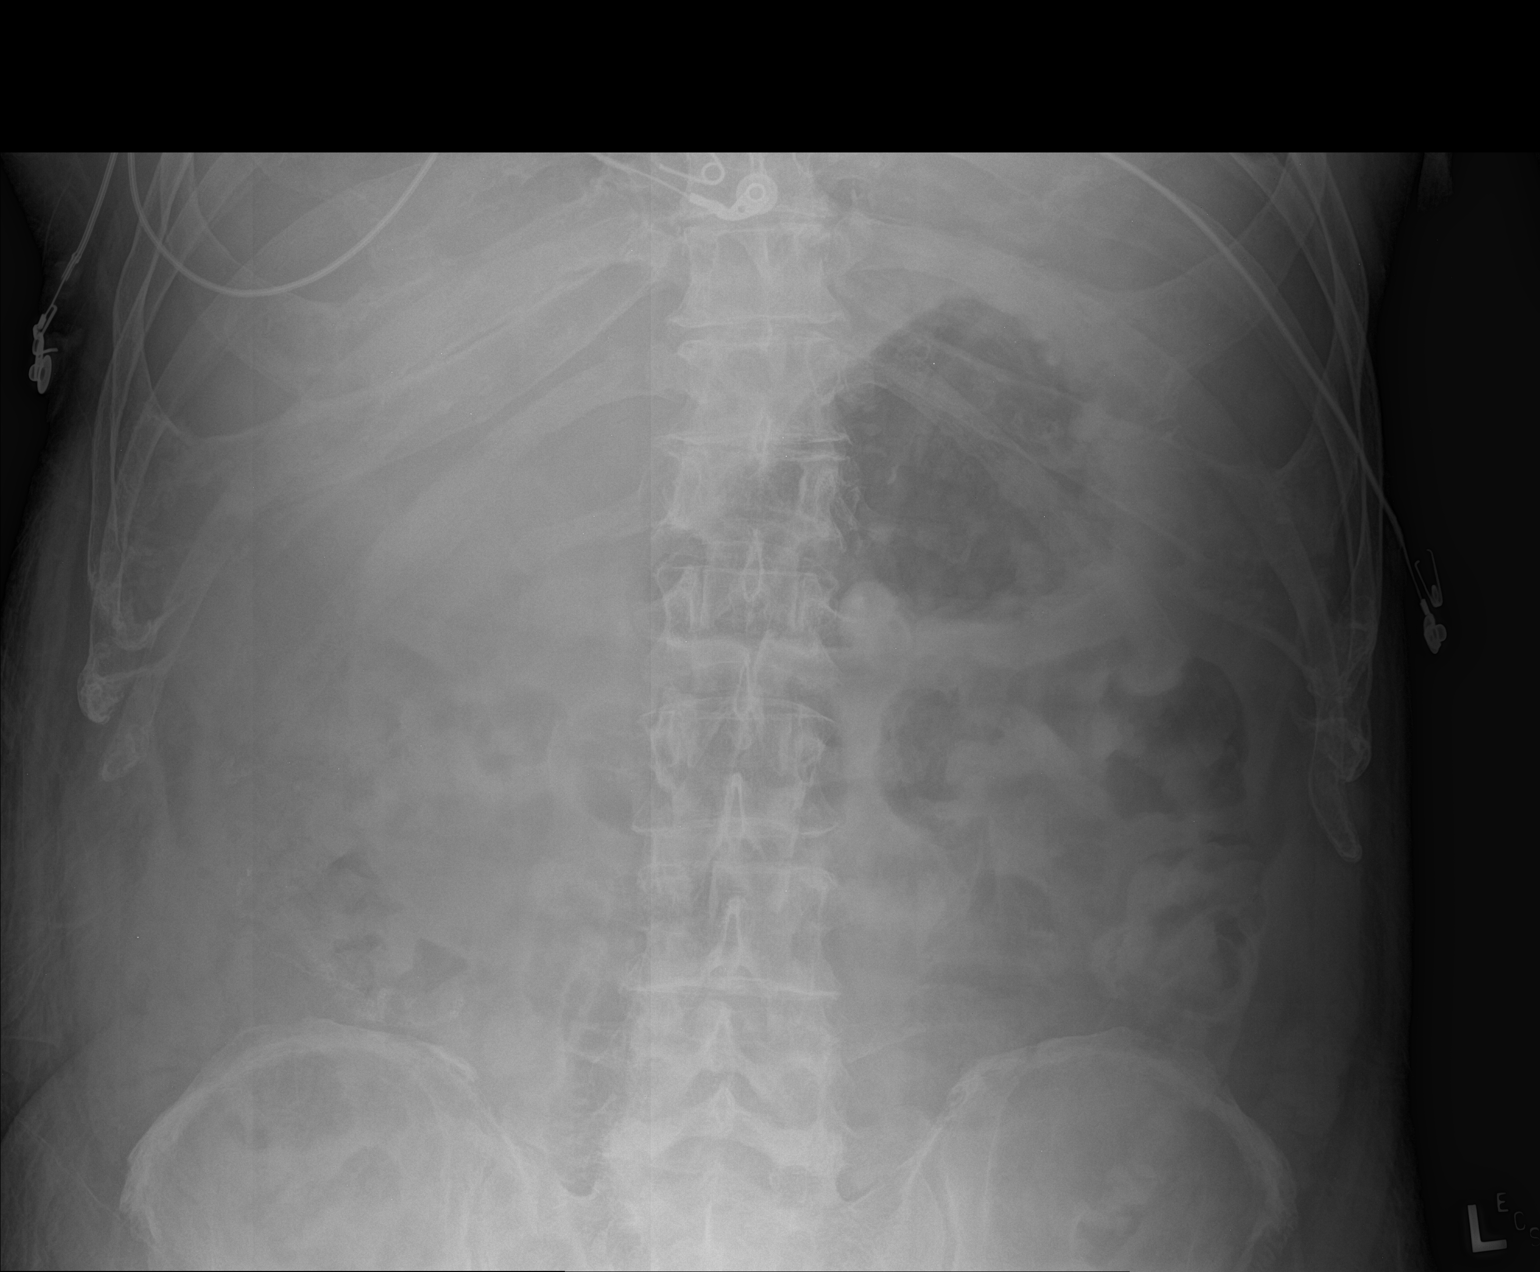
[im 2/2]
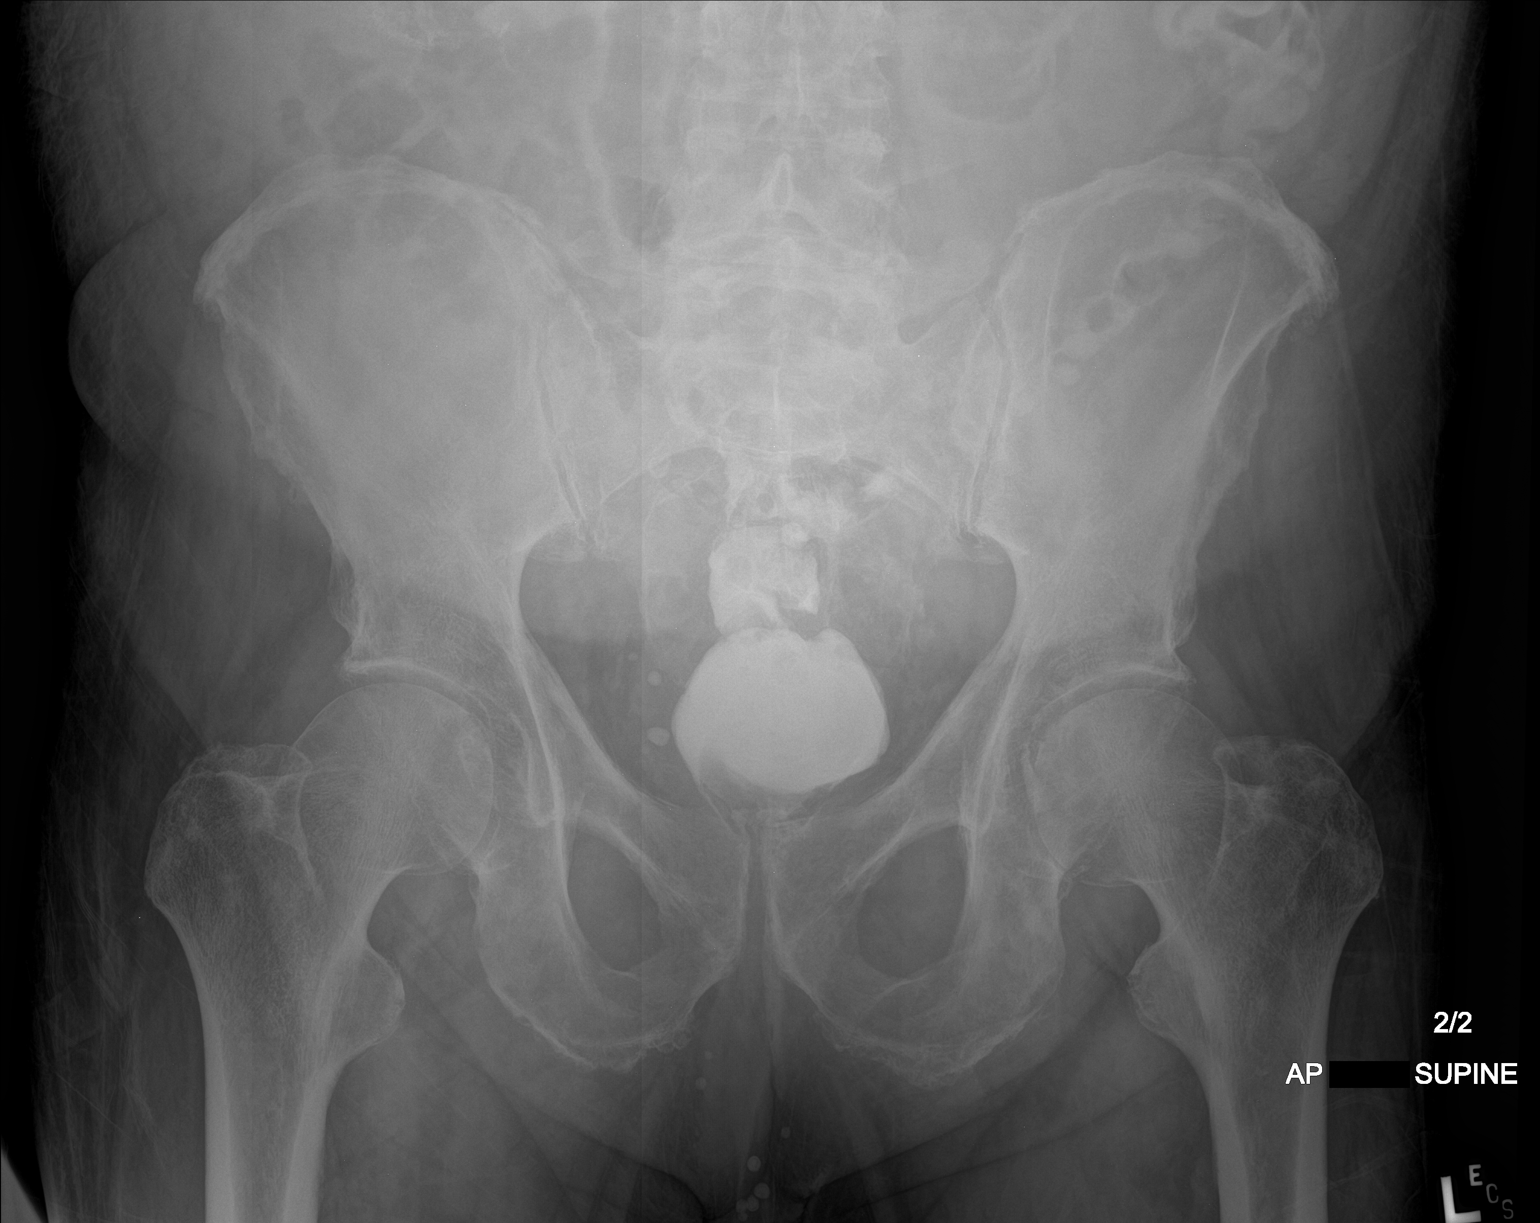

[2 of 2 positions shown; findings below may reference images not displayed]

FINDINGS: No bowel dilatation or evidence of obstruction. No free air or
radiopaque calculi. Contrast noted within the urinary bladder.
Degenerative changes of the spine. No acute osseous pathology. The
soft tissues are grossly unremarkable.
IMPRESSION: No bowel obstruction.
